# Patient Record
Sex: Male | Born: 1935
Health system: Southern US, Community
[De-identification: ages and names within clinical notes are randomized; demographics above are authoritative.]

## PROBLEM LIST (undated history)

## (undated) DIAGNOSIS — D649 Anemia, unspecified: Secondary | ICD-10-CM

## (undated) DIAGNOSIS — H409 Unspecified glaucoma: Secondary | ICD-10-CM

## (undated) DIAGNOSIS — I4891 Unspecified atrial fibrillation: Secondary | ICD-10-CM

## (undated) DIAGNOSIS — N281 Cyst of kidney, acquired: Secondary | ICD-10-CM

## (undated) DIAGNOSIS — K219 Gastro-esophageal reflux disease without esophagitis: Secondary | ICD-10-CM

## (undated) DIAGNOSIS — I10 Essential (primary) hypertension: Secondary | ICD-10-CM

## (undated) DIAGNOSIS — K922 Gastrointestinal hemorrhage, unspecified: Secondary | ICD-10-CM

## (undated) DIAGNOSIS — I2510 Atherosclerotic heart disease of native coronary artery without angina pectoris: Secondary | ICD-10-CM

## (undated) DIAGNOSIS — K5790 Diverticulosis of intestine, part unspecified, without perforation or abscess without bleeding: Secondary | ICD-10-CM

## (undated) DIAGNOSIS — N400 Enlarged prostate without lower urinary tract symptoms: Secondary | ICD-10-CM

## (undated) DIAGNOSIS — M1990 Unspecified osteoarthritis, unspecified site: Secondary | ICD-10-CM

## (undated) DIAGNOSIS — K859 Acute pancreatitis without necrosis or infection, unspecified: Secondary | ICD-10-CM

## (undated) DIAGNOSIS — E7800 Pure hypercholesterolemia, unspecified: Secondary | ICD-10-CM

## (undated) HISTORY — DX: Acute pancreatitis without necrosis or infection, unspecified: K85.90

## (undated) HISTORY — DX: Diverticulosis of intestine, part unspecified, without perforation or abscess without bleeding: K57.90

## (undated) HISTORY — DX: Cyst of kidney, acquired: N28.1

## (undated) HISTORY — DX: Unspecified osteoarthritis, unspecified site: M19.90

## (undated) HISTORY — DX: Essential (primary) hypertension: I10

## (undated) HISTORY — DX: Atherosclerotic heart disease of native coronary artery without angina pectoris: I25.10

## (undated) HISTORY — PX: CORONARY ARTERY BYPASS GRAFT: SHX141

## (undated) HISTORY — DX: Gastro-esophageal reflux disease without esophagitis: K21.9

## (undated) HISTORY — DX: Benign prostatic hyperplasia without lower urinary tract symptoms: N40.0

## (undated) HISTORY — DX: Anemia, unspecified: D64.9

## (undated) HISTORY — DX: Pure hypercholesterolemia, unspecified: E78.00

## (undated) HISTORY — DX: Unspecified atrial fibrillation: I48.91

## (undated) HISTORY — PX: APPENDECTOMY: SHX54

## (undated) HISTORY — DX: Unspecified glaucoma: H40.9

---

## 1989-01-13 HISTORY — PX: OTHER SURGICAL HISTORY: SHX169

## 1992-01-14 HISTORY — PX: CHOLECYSTECTOMY: SHX55

## 2004-08-27 ENCOUNTER — Ambulatory Visit: Payer: Self-pay | Admitting: Ophthalmology

## 2004-10-07 ENCOUNTER — Ambulatory Visit: Payer: Self-pay | Admitting: Ophthalmology

## 2006-08-13 ENCOUNTER — Ambulatory Visit: Payer: Self-pay | Admitting: Internal Medicine

## 2006-08-28 ENCOUNTER — Ambulatory Visit: Payer: Self-pay | Admitting: Internal Medicine

## 2006-11-17 ENCOUNTER — Ambulatory Visit: Payer: Self-pay | Admitting: Internal Medicine

## 2007-01-14 HISTORY — PX: INGUINAL HERNIA REPAIR: SUR1180

## 2007-06-23 ENCOUNTER — Ambulatory Visit: Payer: Self-pay | Admitting: Surgery

## 2007-06-23 ENCOUNTER — Other Ambulatory Visit: Payer: Self-pay

## 2007-07-06 ENCOUNTER — Ambulatory Visit: Payer: Self-pay | Admitting: Surgery

## 2008-08-11 ENCOUNTER — Ambulatory Visit: Payer: Self-pay | Admitting: Unknown Physician Specialty

## 2008-08-11 LAB — HM COLONOSCOPY

## 2009-05-12 ENCOUNTER — Emergency Department: Payer: Self-pay | Admitting: Emergency Medicine

## 2011-05-27 ENCOUNTER — Inpatient Hospital Stay: Payer: Self-pay | Admitting: Specialist

## 2011-05-27 LAB — CBC
HGB: 13.7 g/dL (ref 13.0–18.0)
MCH: 31.2 pg (ref 26.0–34.0)
MCHC: 34.1 g/dL (ref 32.0–36.0)
MCV: 92 fL (ref 80–100)

## 2011-05-27 LAB — COMPREHENSIVE METABOLIC PANEL
Alkaline Phosphatase: 93 U/L (ref 50–136)
Anion Gap: 7 (ref 7–16)
BUN: 14 mg/dL (ref 7–18)
Bilirubin,Total: 0.5 mg/dL (ref 0.2–1.0)
Co2: 27 mmol/L (ref 21–32)
Creatinine: 0.89 mg/dL (ref 0.60–1.30)
EGFR (African American): >60
EGFR (Non-African Amer.): >60
Osmolality: 269 (ref 275–301)
Potassium: 4.1 mmol/L (ref 3.5–5.1)
SGOT(AST): 28 U/L (ref 15–37)
Sodium: 134 mmol/L — ABNORMAL LOW (ref 136–145)
Total Protein: 7.5 g/dL (ref 6.4–8.2)

## 2011-05-27 LAB — CK TOTAL AND CKMB (NOT AT ARMC)
CK, Total: 109 U/L (ref 35–232)
CK, Total: 84 U/L (ref 35–232)
CK, Total: 93 U/L (ref 35–232)
CK-MB: 1.5 ng/mL (ref 0.5–3.6)

## 2011-05-27 LAB — TROPONIN I
Troponin-I: <0.02 ng/mL
Troponin-I: <0.02 ng/mL
Troponin-I: <0.02 ng/mL

## 2011-05-27 LAB — MAGNESIUM: Magnesium: 2 mg/dL

## 2011-05-27 LAB — PROTIME-INR
INR: 1
Prothrombin Time: 13.8 secs (ref 11.5–14.7)

## 2011-05-27 LAB — APTT: Activated PTT: >160 secs (ref 23.6–35.9)

## 2011-05-28 LAB — APTT
Activated PTT: 111.6 secs — ABNORMAL HIGH (ref 23.6–35.9)
Activated PTT: 56.1 secs — ABNORMAL HIGH (ref 23.6–35.9)

## 2011-10-13 ENCOUNTER — Emergency Department: Payer: Self-pay | Admitting: Emergency Medicine

## 2011-10-13 LAB — BASIC METABOLIC PANEL
Anion Gap: 9 (ref 7–16)
BUN: 17 mg/dL (ref 7–18)
Co2: 26 mmol/L (ref 21–32)
Creatinine: 1.09 mg/dL (ref 0.60–1.30)
EGFR (African American): >60

## 2011-10-13 LAB — URINALYSIS, COMPLETE
Bacteria: NONE SEEN
Bilirubin,UR: NEGATIVE
Leukocyte Esterase: NEGATIVE
Nitrite: NEGATIVE
Ph: 8 (ref 4.5–8.0)
Protein: NEGATIVE
RBC,UR: 13 /HPF (ref 0–5)
Squamous Epithelial: <1

## 2011-10-13 LAB — CBC
HCT: 43.3 % (ref 40.0–52.0)
HGB: 14.4 g/dL (ref 13.0–18.0)
MCHC: 33.3 g/dL (ref 32.0–36.0)
RBC: 4.75 10*6/uL (ref 4.40–5.90)
WBC: 13.5 10*3/uL — ABNORMAL HIGH (ref 3.8–10.6)

## 2011-10-13 LAB — PROTIME-INR: Prothrombin Time: 25 secs — ABNORMAL HIGH (ref 11.5–14.7)

## 2011-11-19 ENCOUNTER — Encounter: Payer: Self-pay | Admitting: Internal Medicine

## 2011-11-19 ENCOUNTER — Encounter: Payer: Self-pay | Admitting: *Deleted

## 2011-11-20 ENCOUNTER — Encounter: Payer: Self-pay | Admitting: Internal Medicine

## 2011-11-20 ENCOUNTER — Ambulatory Visit (INDEPENDENT_AMBULATORY_CARE_PROVIDER_SITE_OTHER): Payer: Medicare Other | Admitting: Internal Medicine

## 2011-11-20 VITALS — BP 138/78 | HR 67 | Temp 97.8°F | Ht 66.0 in | Wt 153.0 lb

## 2011-11-20 DIAGNOSIS — E7800 Pure hypercholesterolemia, unspecified: Secondary | ICD-10-CM

## 2011-11-20 DIAGNOSIS — I2510 Atherosclerotic heart disease of native coronary artery without angina pectoris: Secondary | ICD-10-CM

## 2011-11-20 DIAGNOSIS — D649 Anemia, unspecified: Secondary | ICD-10-CM

## 2011-11-20 DIAGNOSIS — I4891 Unspecified atrial fibrillation: Secondary | ICD-10-CM

## 2011-11-20 DIAGNOSIS — E871 Hypo-osmolality and hyponatremia: Secondary | ICD-10-CM

## 2011-11-20 DIAGNOSIS — I10 Essential (primary) hypertension: Secondary | ICD-10-CM

## 2011-11-20 LAB — CBC WITH DIFFERENTIAL/PLATELET
Basophils Absolute: 0 10*3/uL (ref 0.0–0.1)
Eosinophils Absolute: 0.1 10*3/uL (ref 0.0–0.7)
HCT: 42 % (ref 39.0–52.0)
Lymphs Abs: 1.5 10*3/uL (ref 0.7–4.0)
MCHC: 33.5 g/dL (ref 30.0–36.0)
Monocytes Relative: 11.6 % (ref 3.0–12.0)
Neutro Abs: 4.1 10*3/uL (ref 1.4–7.7)
Platelets: 253 10*3/uL (ref 150.0–400.0)
RDW: 14.8 % — ABNORMAL HIGH (ref 11.5–14.6)

## 2011-11-20 LAB — HEPATIC FUNCTION PANEL
AST: 33 U/L (ref 0–37)
Albumin: 4.5 g/dL (ref 3.5–5.2)
Alkaline Phosphatase: 71 U/L (ref 39–117)
Bilirubin, Direct: 0.1 mg/dL (ref 0.0–0.3)
Total Protein: 7.9 g/dL (ref 6.0–8.3)

## 2011-11-20 LAB — BASIC METABOLIC PANEL
BUN: 17 mg/dL (ref 6–23)
Calcium: 9.2 mg/dL (ref 8.4–10.5)
Creatinine, Ser: 1 mg/dL (ref 0.4–1.5)

## 2011-11-20 LAB — LIPID PANEL
Cholesterol: 154 mg/dL (ref 0–200)
Triglycerides: 159 mg/dL — ABNORMAL HIGH (ref 0.0–149.0)

## 2011-11-20 NOTE — Progress Notes (Signed)
  Subjective:    [name]: Tyler Stout, male    DOB: 03/01/1925, 78 y.o.   MRN: [id number]  HPI 78 year old male with past history of atherosclerotic heart disease s/p bypass graft x 4, hypercholesterolemia, reflux disease, hypertension and gallstone pancreatitis.  He comes in today for a scheduled follow up.  He states he is feeling better.  Recently had pneumonia.  States follow up CXR cleared.  No cough or congestion now.  Breathing at his baseline.  No chest pain or tightness.  Just saw Dr Wyn Quaker.  Stable.  Follow up recommended in six months.   Past Medical History  Diagnosis Date  . CAD (coronary artery disease)     s/p bypass graft x 4 (1991)  . GERD (gastroesophageal reflux disease)   . Atrial fibrillation   . Anemia     iron deficiency  . Hypertension   . Hypercholesterolemia   . Pancreatitis     x2 (1994 and 1998), presumed to be gallstone pancreatitis.  s/p ERCP and sphincterotomy  . Bilateral renal cysts     worked up by Dr Lonna Cobb  . Glaucoma   . BPH (benign prostatic hypertrophy)   . Diverticulosis   . Arthritis     Review of Systems Patient denies any headache, lightheadedness or dizziness.  Some drainage, but no significant sinus pressure or nasal congestion.   No chest pain, tightness or palpitations.  No increased shortness of breath, cough or congestion.  Breathing at baseline.   No nausea or vomiting.  No abdominal pain or cramping.  No bowel change, such as diarrhea, constipation, BRBPR or melana.  No urine change.        Objective:   Physical Exam 78  year old male in no acute distress.   HEENT:  Nares - clear.  OP- without lesions or erythema.  NECK:  Supple, nontender.    HEART:  Rate controlled.  LUNGS:  Without crackles or wheezing audible.  Respirations even and unlabored.   RADIAL PULSE:  Equal bilaterally.  ABDOMEN:  Soft, nontender.    EXTREMITIES:  No increased edema to be present.                  Assessment & Plan:  GI.  Doing better.  No  problems with increased gas.  Taking a probiotic daily.  Follow.  Colonoscopy 08/11/08 - diverticulosis and internal hemorrhoids.     HEALTH MAINTENANCE.  Last physical 04/07/11.  Prostate and PSAs followed by Dr Lonna Cobb.  Colonoscopy 08/11/08 revealed diverticulosis and internal hemorrhoids.  Got his flu shot a few weeks ago.

## 2011-11-20 NOTE — Patient Instructions (Addendum)
It was nice seeing you today.  I am glad you are feeling better.  We will check your cholesterol, etc today and notify you of the results once they become available.

## 2011-11-21 ENCOUNTER — Telehealth: Payer: Self-pay | Admitting: Internal Medicine

## 2011-11-21 DIAGNOSIS — R739 Hyperglycemia, unspecified: Secondary | ICD-10-CM

## 2011-11-21 NOTE — Telephone Encounter (Signed)
Notify pt cholesterol looks good.  Liver function wnl.  Other labs ok except slightly increased sugar.  Would like for him to return for a follow up sugar check to confirm wnl.  i have placed the order.  Please schedule him an appt to return for labs (schedule in 2-3 weeks) - week of thanksgiving (just before).

## 2011-11-21 NOTE — Telephone Encounter (Signed)
Called patient with results. Patient called back and scheduled appointment Nov 20 at 8:45.

## 2011-12-03 ENCOUNTER — Other Ambulatory Visit (INDEPENDENT_AMBULATORY_CARE_PROVIDER_SITE_OTHER): Payer: Medicare Other

## 2011-12-03 DIAGNOSIS — R7309 Other abnormal glucose: Secondary | ICD-10-CM

## 2011-12-03 DIAGNOSIS — R739 Hyperglycemia, unspecified: Secondary | ICD-10-CM

## 2011-12-03 LAB — GLUCOSE, RANDOM: Glucose, Bld: 105 mg/dL — ABNORMAL HIGH (ref 70–99)

## 2011-12-07 ENCOUNTER — Encounter: Payer: Self-pay | Admitting: Internal Medicine

## 2011-12-07 DIAGNOSIS — I4891 Unspecified atrial fibrillation: Secondary | ICD-10-CM | POA: Insufficient documentation

## 2011-12-07 DIAGNOSIS — I2510 Atherosclerotic heart disease of native coronary artery without angina pectoris: Secondary | ICD-10-CM | POA: Insufficient documentation

## 2011-12-07 DIAGNOSIS — E871 Hypo-osmolality and hyponatremia: Secondary | ICD-10-CM | POA: Insufficient documentation

## 2011-12-07 NOTE — Assessment & Plan Note (Signed)
Rate controlled on current meds.  On coumadin.  Dr Fath following.    

## 2011-12-07 NOTE — Assessment & Plan Note (Signed)
Sodium has been stable around 133-134.  Follow.  Recheck with next labs.

## 2011-12-07 NOTE — Assessment & Plan Note (Signed)
Blood pressure has been under good control on outside checks.  Same meds.  Check metabolic panel.  Follow pressures.

## 2011-12-07 NOTE — Assessment & Plan Note (Signed)
History of CAD s/p CABG.  Sees cardiology regularl - Dr Fath.  Negative functional study 01/18/10.  Currently symptoms stable.  Follow.  Continue risk factor modification.    

## 2011-12-07 NOTE — Assessment & Plan Note (Signed)
On Vytorin.  Low cholesterol diet and exercise.  Follow.   

## 2011-12-07 NOTE — Assessment & Plan Note (Signed)
Hgb has been stable.  Follow cbc.  Has seen GI.     

## 2012-01-18 ENCOUNTER — Telehealth: Payer: Self-pay | Admitting: Internal Medicine

## 2012-01-18 NOTE — Telephone Encounter (Signed)
Pt notified me that he needed to change from vytorin (his cholesterol medicine) - secondary to insurance.  Please confirm with him if he has had any problems taking lipitor.  If so, then see what other cholesterol medications he has had trouble taking and let me know.  Will need to change medication.

## 2012-01-19 NOTE — Telephone Encounter (Signed)
[phone]  Or [phone] Pt returned your

## 2012-01-19 NOTE — Telephone Encounter (Signed)
Patient stated that he had taken one of the other cholesterol medications (he does not remember which) and had a bad reaction. Pancreatis.

## 2012-01-20 ENCOUNTER — Telehealth: Payer: Self-pay | Admitting: Internal Medicine

## 2012-01-20 DIAGNOSIS — E7800 Pure hypercholesterolemia, unspecified: Secondary | ICD-10-CM

## 2012-01-20 DIAGNOSIS — I4891 Unspecified atrial fibrillation: Secondary | ICD-10-CM

## 2012-01-20 DIAGNOSIS — R739 Hyperglycemia, unspecified: Secondary | ICD-10-CM

## 2012-01-20 DIAGNOSIS — D649 Anemia, unspecified: Secondary | ICD-10-CM

## 2012-01-20 DIAGNOSIS — I10 Essential (primary) hypertension: Secondary | ICD-10-CM

## 2012-01-20 DIAGNOSIS — E871 Hypo-osmolality and hyponatremia: Secondary | ICD-10-CM

## 2012-01-20 NOTE — Telephone Encounter (Signed)
Patient wants to speak with about his cholesterol medication.

## 2012-01-21 NOTE — Telephone Encounter (Signed)
He is having to change cholesterol medications - secondary to insurance.  He has been on vytorin for years.  Insurance no longer covering.  Please call pt and tell him I am trying to get the information from [facility] - which cholesterol medicine he had tried previously.  Please ask him if he remembers when he took the medicine that caused him problems.

## 2012-01-21 NOTE — Telephone Encounter (Signed)
See other message

## 2012-01-21 NOTE — Telephone Encounter (Signed)
Forward to Dr. Scott 

## 2012-01-21 NOTE — Telephone Encounter (Signed)
Patient stated that he doesn't remember when he took the medicine that caused him problems. He said he would be willing to try Lipitor

## 2012-01-22 ENCOUNTER — Telehealth: Payer: Self-pay | Admitting: Internal Medicine

## 2012-01-22 NOTE — Telephone Encounter (Signed)
Called.  Pt had been on lipitor and was switched - secondary to not reaching goal for cholesterol

## 2012-01-22 NOTE — Telephone Encounter (Signed)
Give Tyler Stout a call.

## 2012-01-25 MED ORDER — ATORVASTATIN CALCIUM 20 MG PO TABS: 20.0000 mg | ORAL_TABLET | Freq: Every day | ORAL | Status: DC

## 2012-01-25 NOTE — Telephone Encounter (Signed)
Pt notified - reviewed [facility] records.  Has been on lipitor previously with no problems.  Will start lipitor 20mg  q day.  Needs labs 6 weeks after starts.  Needs lab appt scheduled for 03/15/12 at 8:00.  Pt aware of lab appt time - just please put on lab schedlule.  Thanks.

## 2012-01-26 NOTE — Telephone Encounter (Signed)
Appointment made

## 2012-01-28 LAB — PROTIME-INR

## 2012-02-10 ENCOUNTER — Telehealth: Payer: Self-pay | Admitting: Internal Medicine

## 2012-02-10 NOTE — Telephone Encounter (Signed)
Noted.  Please call pt and inform him to bring bp readings to his appt tomorrow.  Confirm he is doing well.

## 2012-02-10 NOTE — Telephone Encounter (Signed)
Called patient to remind him to bring in his bp readings. Patient stated that him is doing ok.

## 2012-02-10 NOTE — Telephone Encounter (Signed)
Pt calling, states he spoke with someone earlier this morning that was supposed to get back with him and told him to call back in 20 minutes if he had not heard back.  Pt states it has been 40 minutes and he has not heard back.  No msg in chart.  Pt asking for a call back.

## 2012-02-10 NOTE — Telephone Encounter (Signed)
See other message

## 2012-02-10 NOTE — Telephone Encounter (Signed)
Patient Information:  Caller Name: Caisen  Phone: [phone]  Patient: Tyler Stout, Tyler Stout  Gender: Male  DOB: [birth year]  Age: 79 Years  PCP: Dale Wanblee  Office Follow Up:  Does the office need to follow up with this patient?: No  Instructions For The Office: N/A  RN Note:  Denies emergent symptoms per hypertension protocol; advised appt within 2 weeks.  Appt scheduled 0930 02/11/12 with Dr. Lorin Picket.  krs/can  Symptoms  Reason For Call & Symptoms: hypertension; BP 173/80; increased cardizem per cardiologist 2 weeks ago.  Reviewed Health History In EMR: Yes  Reviewed Medications In EMR: Yes  Reviewed Allergies In EMR: Yes  Reviewed Surgeries / Procedures: Yes  Date of Onset of Symptoms: 01/20/2012  Guideline(s) Used:  High Blood Pressure  Disposition Per Guideline:   See Within 2 Weeks in Office  Reason For Disposition Reached:   BP > 140/90 and is taking BP medications  Advice Given:  N/A  Appointment Scheduled:  02/11/2012 09:30:00 Appointment Scheduled Provider:  Dale Irwin

## 2012-02-11 ENCOUNTER — Ambulatory Visit: Payer: Self-pay | Admitting: Internal Medicine

## 2012-02-12 ENCOUNTER — Encounter: Payer: Self-pay | Admitting: Internal Medicine

## 2012-02-12 ENCOUNTER — Ambulatory Visit (INDEPENDENT_AMBULATORY_CARE_PROVIDER_SITE_OTHER): Payer: Medicare Other | Admitting: Internal Medicine

## 2012-02-12 VITALS — BP 162/78 | HR 78 | Temp 98.0°F | Resp 16 | Wt 146.5 lb

## 2012-02-12 DIAGNOSIS — I2510 Atherosclerotic heart disease of native coronary artery without angina pectoris: Secondary | ICD-10-CM

## 2012-02-12 DIAGNOSIS — I4891 Unspecified atrial fibrillation: Secondary | ICD-10-CM

## 2012-02-12 DIAGNOSIS — I10 Essential (primary) hypertension: Secondary | ICD-10-CM

## 2012-02-12 DIAGNOSIS — E871 Hypo-osmolality and hyponatremia: Secondary | ICD-10-CM

## 2012-02-12 MED ORDER — SERTRALINE HCL 25 MG PO TABS: 25.0000 mg | ORAL_TABLET | Freq: Every day | ORAL | Status: DC

## 2012-02-12 MED ORDER — HYDROCHLOROTHIAZIDE 12.5 MG PO CAPS: 12.5000 mg | ORAL_CAPSULE | Freq: Every day | ORAL | Status: DC

## 2012-02-13 ENCOUNTER — Encounter: Payer: Self-pay | Admitting: Internal Medicine

## 2012-02-13 NOTE — Assessment & Plan Note (Signed)
Adding the hctz as outlined.  Will need to follow sodium closely.

## 2012-02-13 NOTE — Assessment & Plan Note (Signed)
Just saw Dr Lady Gary.  Felt heart stable.  Follow.

## 2012-02-13 NOTE — Progress Notes (Signed)
Subjective:    [name]: Tyler Stout, male    DOB: 05/13/1925, 79 y.o.   MRN: [id number]  HPI 79 year old male with past history of atherosclerotic heart disease s/p bypass graft x 4, hypercholesterolemia, reflux disease, hypertension and gallstone pancreatitis.  He comes in today as a work in with concerns regarding elevated blood pressure.  States that his blood pressure has been running higher lately.  Saw Dr Lady Gary a few weeks ago.  States his heart checked out fine.  Dr Lady Gary increased his cardizem to bid. Did EKG.   Felt his heart checked out fine.  Blood pressure has been averaging 130-150s/70s until the last few days.  Lately has been increased 160-170s/70s.  States he has been more anxious lately.  Worrying.  Has some financial issues he needs to get addressed.  No chest pain or tightness.  Not sleeping well because he is worrying and mind won't shut down.  No depression.     Past Medical History  Diagnosis Date  . CAD (coronary artery disease)     s/p bypass graft x 4 (1991)  . GERD (gastroesophageal reflux disease)   . Atrial fibrillation   . Anemia     iron deficiency  . Hypertension   . Hypercholesterolemia   . Pancreatitis     x2 (1994 and 1998), presumed to be gallstone pancreatitis.  s/p ERCP and sphincterotomy  . Bilateral renal cysts     worked up by Dr Lonna Cobb  . Glaucoma   . BPH (benign prostatic hypertrophy)   . Diverticulosis   . Arthritis     Current Outpatient Prescriptions on File Prior to Visit  Medication Sig Dispense Refill  . atorvastatin (LIPITOR) 20 MG tablet Take 1 tablet (20 mg total) by mouth daily.  30 tablet  3  . Calcium Carbonate-Vitamin D (CALCIUM 600+D) 600-400 MG-UNIT per tablet Take 1 tablet by mouth daily.      . Diltiazem HCl Coated Beads (CARDIZEM CD PO) Take 120 mg by mouth daily. As directed      . fish oil-omega-3 fatty acids 1000 MG capsule Take 1 g by mouth daily.      . fluticasone (FLONASE) 50 MCG/ACT nasal spray Place 2 sprays  into the nose daily. 2 Sprays in each nostril daily      . hydrochlorothiazide (MICROZIDE) 12.5 MG capsule Take 1 capsule (12.5 mg total) by mouth daily.  30 capsule  2  . irbesartan-hydrochlorothiazide (AVALIDE) 300-12.5 MG per tablet Take 1 tablet by mouth daily.      Marland Kitchen latanoprost (XALATAN) 0.005 % ophthalmic solution Place 1 drop into both eyes daily. 1 drop in each eye      . metoprolol tartrate (LOPRESSOR) 25 MG tablet Take 25 mg by mouth 2 (two) times daily.      . Multiple Vitamin (MULTI-VITAMIN DAILY PO) Take by mouth.      Marland Kitchen omeprazole (PRILOSEC) 20 MG capsule Take 20 mg by mouth daily.      . sertraline (ZOLOFT) 25 MG tablet Take 1 tablet (25 mg total) by mouth daily.  30 tablet  2  . vitamin C (ASCORBIC ACID) 500 MG tablet Take 500 mg by mouth daily.      Marland Kitchen warfarin (COUMADIN) 5 MG tablet 5 mg. As directed        Review of Systems Patient denies any headache, lightheadedness or dizziness.  No chest pain, tightness or palpitations.  No increased shortness of breath, cough or congestion.  Breathing at baseline.  No nausea or vomiting.  No abdominal pain or cramping.  No bowel change, such as diarrhea, constipation, BRBPR or melana.  No urine change.   Increased anxiety as outlined.  No depression.  Not sleeping as well.       Objective:   Physical Exam  Filed Vitals:   02/12/12 1208  BP: 162/78  Pulse: 78  Temp: 98 F (36.7 C)  Resp: 16   Blood pressure recheck:  40/7  79  year old male in no acute distress.   HEENT:  Nares - clear.  OP- without lesions or erythema.  NECK:  Supple, nontender.    HEART:  Rate controlled.  LUNGS:  Without crackles or wheezing audible.  Respirations even and unlabored.   RADIAL PULSE:  Equal bilaterally.  ABDOMEN:  Soft, nontender.    EXTREMITIES:  No increased edema to be present.                  Assessment & Plan:  INCREASED STRESS/ANXIETY.  Discussed at length with the patient and his wife.  No depression.  Not sleeping as well.   Will start zoloft 25mg  q day.  Follow closely.  Get him back in soon to reassess.  Pt comfortable with this plan.  Will let me know if any problems.  GI.  Has done better.  No problems with increased gas.  Taking a probiotic daily.  Follow.  Colonoscopy 08/11/08 - diverticulosis and internal hemorrhoids.     HEALTH MAINTENANCE.  Last physical 04/07/11.  Prostate and PSAs followed by Dr Lonna Cobb.  Colonoscopy 08/11/08 revealed diverticulosis and internal hemorrhoids.  Got his flu shot.

## 2012-02-13 NOTE — Assessment & Plan Note (Signed)
Blood pressure elevated.  Will add HCTZ 12.5mg  q day to his current regimen.  Follow pressures closely.  Will need to monitor sodium.

## 2012-02-13 NOTE — Assessment & Plan Note (Signed)
Rate controlled.  Seeing Dr Lady Gary.  Follow.

## 2012-02-25 ENCOUNTER — Telehealth: Payer: Self-pay | Admitting: Internal Medicine

## 2012-02-25 NOTE — Telephone Encounter (Signed)
Please Advise

## 2012-02-25 NOTE — Telephone Encounter (Signed)
Patient notified

## 2012-02-25 NOTE — Telephone Encounter (Signed)
He is going to need to be seen if having urinary sx.  If acute sx - to acute care today - I will not be in office this pm.  off

## 2012-02-25 NOTE — Telephone Encounter (Signed)
Patient has a urinary tract infection and wants something called into the pharmacy . He wants a call back when or if you call something in.

## 2012-03-01 ENCOUNTER — Emergency Department: Payer: Self-pay | Admitting: Emergency Medicine

## 2012-03-01 LAB — URINALYSIS, COMPLETE
Glucose,UR: NEGATIVE mg/dL (ref 0–75)
Ph: 7 (ref 4.5–8.0)
Protein: NEGATIVE
RBC,UR: 11 /HPF (ref 0–5)
Squamous Epithelial: <1
WBC UR: 1 /HPF (ref 0–5)

## 2012-03-15 ENCOUNTER — Other Ambulatory Visit (INDEPENDENT_AMBULATORY_CARE_PROVIDER_SITE_OTHER): Payer: Medicare Other

## 2012-03-15 DIAGNOSIS — I4891 Unspecified atrial fibrillation: Secondary | ICD-10-CM

## 2012-03-15 DIAGNOSIS — R7309 Other abnormal glucose: Secondary | ICD-10-CM

## 2012-03-15 DIAGNOSIS — I10 Essential (primary) hypertension: Secondary | ICD-10-CM

## 2012-03-15 DIAGNOSIS — R739 Hyperglycemia, unspecified: Secondary | ICD-10-CM

## 2012-03-15 DIAGNOSIS — D649 Anemia, unspecified: Secondary | ICD-10-CM

## 2012-03-15 DIAGNOSIS — E7800 Pure hypercholesterolemia, unspecified: Secondary | ICD-10-CM

## 2012-03-15 LAB — BASIC METABOLIC PANEL
BUN: 22 mg/dL (ref 6–23)
CO2: 23 mEq/L (ref 19–32)
Calcium: 8.7 mg/dL (ref 8.4–10.5)
Creatinine, Ser: 0.8 mg/dL (ref 0.4–1.5)
Glucose, Bld: 108 mg/dL — ABNORMAL HIGH (ref 70–99)

## 2012-03-15 LAB — HEPATIC FUNCTION PANEL
AST: 25 U/L (ref 0–37)
Albumin: 3.4 g/dL — ABNORMAL LOW (ref 3.5–5.2)
Alkaline Phosphatase: 76 U/L (ref 39–117)
Bilirubin, Direct: 0.3 mg/dL (ref 0.0–0.3)
Total Protein: 6.4 g/dL (ref 6.0–8.3)

## 2012-03-15 LAB — CBC WITH DIFFERENTIAL/PLATELET
Basophils Absolute: 0.1 10*3/uL (ref 0.0–0.1)
Eosinophils Absolute: 0.1 10*3/uL (ref 0.0–0.7)
Lymphocytes Relative: 8.6 % — ABNORMAL LOW (ref 12.0–46.0)
MCHC: 33.9 g/dL (ref 30.0–36.0)
Monocytes Absolute: 0.9 10*3/uL (ref 0.1–1.0)
Neutrophils Relative %: 80.1 % — ABNORMAL HIGH (ref 43.0–77.0)
RDW: 14.2 % (ref 11.5–14.6)

## 2012-03-15 LAB — LIPID PANEL
HDL: 51.5 mg/dL (ref >39.00)
Triglycerides: 94 mg/dL (ref 0.0–149.0)

## 2012-03-15 LAB — FERRITIN: Ferritin: 113.7 ng/mL (ref 22.0–322.0)

## 2012-03-17 ENCOUNTER — Ambulatory Visit (INDEPENDENT_AMBULATORY_CARE_PROVIDER_SITE_OTHER): Payer: Medicare Other | Admitting: Internal Medicine

## 2012-03-17 ENCOUNTER — Encounter: Payer: Self-pay | Admitting: Internal Medicine

## 2012-03-17 VITALS — BP 140/68 | HR 76 | Temp 97.7°F | Ht 66.0 in | Wt 148.5 lb

## 2012-03-17 DIAGNOSIS — E871 Hypo-osmolality and hyponatremia: Secondary | ICD-10-CM

## 2012-03-17 DIAGNOSIS — I2510 Atherosclerotic heart disease of native coronary artery without angina pectoris: Secondary | ICD-10-CM

## 2012-03-17 DIAGNOSIS — I4891 Unspecified atrial fibrillation: Secondary | ICD-10-CM

## 2012-03-17 DIAGNOSIS — I10 Essential (primary) hypertension: Secondary | ICD-10-CM

## 2012-03-18 ENCOUNTER — Other Ambulatory Visit: Payer: Self-pay | Admitting: Internal Medicine

## 2012-03-18 DIAGNOSIS — E871 Hypo-osmolality and hyponatremia: Secondary | ICD-10-CM

## 2012-03-18 NOTE — Progress Notes (Signed)
Order placed for follow up sodium.  

## 2012-03-19 ENCOUNTER — Other Ambulatory Visit: Payer: Medicare Other

## 2012-03-21 ENCOUNTER — Encounter: Payer: Self-pay | Admitting: Internal Medicine

## 2012-03-21 NOTE — Assessment & Plan Note (Signed)
Blood pressure today 132/72.  Did not take the HCTZ or the avalide this am.  Will continue the avalide.  Will remain off the hctz.  Follow sodium.  Recheck today.

## 2012-03-21 NOTE — Assessment & Plan Note (Signed)
History of CAD s/p CABG.  Sees cardiology regularl - Dr Lady Gary.  Negative functional study 01/18/10.  Currently symptoms stable.  Follow.  Continue risk factor modification.

## 2012-03-21 NOTE — Assessment & Plan Note (Signed)
Remain off hctz.  Will follow sodium.

## 2012-03-21 NOTE — Assessment & Plan Note (Signed)
Rate controlled on current meds.  On coumadin.  Dr Fath following.    

## 2012-03-21 NOTE — Progress Notes (Signed)
Subjective:    [name]: Tyler Stout, male    DOB: 1925-10-07, 79 y.o.   MRN: [id number]  HPI 79 year old male with past history of atherosclerotic heart disease s/p bypass graft x 4, hypercholesterolemia, reflux disease, hypertension and gallstone pancreatitis.  He comes in today for a scheduled follow up.  His blood pressure had been running higher lately.  Saw Dr Lady Gary recently.   States his heart checked out fine.  Dr Lady Gary increased his cardizem to bid. Did EKG.   We added 12.5mg  of HCTZ.  Blood pressure has been averaging 130-150s/70s.   Sodium just checked and down.  Was instructed to stop the hctz.  States he has been more anxious lately.  Worrying.  Has some financial issues he needs to get addressed.  Started zoloft, but stopped.  Did not feel it was helping.  No chest pain or tightness.  Not sleeping well because he is worrying and mind won't shut down.       Past Medical History  Diagnosis Date  . CAD (coronary artery disease)     s/p bypass graft x 4 (1991)  . GERD (gastroesophageal reflux disease)   . Atrial fibrillation   . Anemia     iron deficiency  . Hypertension   . Hypercholesterolemia   . Pancreatitis     x2 (1994 and 1998), presumed to be gallstone pancreatitis.  s/p ERCP and sphincterotomy  . Bilateral renal cysts     worked up by Dr Lonna Cobb  . Glaucoma   . BPH (benign prostatic hypertrophy)   . Diverticulosis   . Arthritis     Current Outpatient Prescriptions on File Prior to Visit  Medication Sig Dispense Refill  . atorvastatin (LIPITOR) 20 MG tablet Take 1 tablet (20 mg total) by mouth daily.  30 tablet  3  . Calcium Carbonate-Vitamin D (CALCIUM 600+D) 600-400 MG-UNIT per tablet Take 1 tablet by mouth daily.      . Diltiazem HCl Coated Beads (CARDIZEM CD PO) Take 120 mg by mouth daily. As directed      . fish oil-omega-3 fatty acids 1000 MG capsule Take 1 g by mouth daily.      . irbesartan-hydrochlorothiazide (AVALIDE) 300-12.5 MG per tablet Take 1  tablet by mouth daily.      Marland Kitchen latanoprost (XALATAN) 0.005 % ophthalmic solution Place 1 drop into both eyes daily. 1 drop in each eye      . metoprolol tartrate (LOPRESSOR) 25 MG tablet Take 25 mg by mouth 2 (two) times daily.      . Multiple Vitamin (MULTI-VITAMIN DAILY PO) Take by mouth.      Marland Kitchen omeprazole (PRILOSEC) 20 MG capsule Take 20 mg by mouth daily.      . vitamin C (ASCORBIC ACID) 500 MG tablet Take 500 mg by mouth daily.      Marland Kitchen warfarin (COUMADIN) 5 MG tablet 5 mg. As directed      . fluticasone (FLONASE) 50 MCG/ACT nasal spray Place 2 sprays into the nose daily. 2 Sprays in each nostril daily      . hydrochlorothiazide (MICROZIDE) 12.5 MG capsule Take 1 capsule (12.5 mg total) by mouth daily.  30 capsule  2  . sertraline (ZOLOFT) 25 MG tablet Take 1 tablet (25 mg total) by mouth daily.  30 tablet  2   No current facility-administered medications on file prior to visit.    Review of Systems Patient denies any headache.  Does report a couple of episodes where  his head felt funny.  Woke up - noticed.  No chest pain, tightness or palpitations.  No increased shortness of breath, cough or congestion.  Breathing at baseline.   No nausea or vomiting.  No abdominal pain or cramping.  No bowel change, such as diarrhea, constipation, BRBPR or melana.  No urine change.   Increased anxiety as outlined.  No depression.  Not sleeping as well.       Objective:   Physical Exam  Filed Vitals:   03/17/12 0912  BP: 140/68  Pulse: 76  Temp: 97.7 F (36.5 C)   Blood pressure recheck:  66/65  79  year old male in no acute distress.   HEENT:  Nares - clear.  OP- without lesions or erythema.  NECK:  Supple, nontender.    HEART:  Rate controlled.  LUNGS:  Without crackles or wheezing audible.  Respirations even and unlabored.   RADIAL PULSE:  Equal bilaterally.  ABDOMEN:  Soft, nontender.    EXTREMITIES:  No increased edema to be present.                  Assessment & Plan:  INCREASED  STRESS/ANXIETY.  Discussed at length with the patient and his wife.  No depression.  Will remain off medication for now.  Follow closely.  Continue increased po intake and stay hydrated.  He prefers no medication or further intervention at this time.   DIZZINESS.  Unclear as to the exact etiology.  Denies now.  Blood pressure looks good today off the hctz.  Could have been some orthostatic hypotension.  Also discussed eating regular meals.  Just had heart checked out and everything checked out fine.     GI.  Has done better.  No problems with increased gas.  Taking a probiotic daily.  Follow.  Colonoscopy 08/11/08 - diverticulosis and internal hemorrhoids.     HEALTH MAINTENANCE.  Last physical 04/07/11.  Prostate and PSAs followed by Dr Lonna Cobb.  Colonoscopy 08/11/08 revealed diverticulosis and internal hemorrhoids.  Got his flu shot.

## 2012-03-23 ENCOUNTER — Other Ambulatory Visit (INDEPENDENT_AMBULATORY_CARE_PROVIDER_SITE_OTHER): Payer: Medicare Other

## 2012-03-23 ENCOUNTER — Other Ambulatory Visit: Payer: Self-pay | Admitting: Internal Medicine

## 2012-03-23 DIAGNOSIS — E871 Hypo-osmolality and hyponatremia: Secondary | ICD-10-CM

## 2012-03-23 NOTE — Progress Notes (Signed)
Order placed for f/u sodium.  ?

## 2012-04-14 ENCOUNTER — Other Ambulatory Visit (INDEPENDENT_AMBULATORY_CARE_PROVIDER_SITE_OTHER): Payer: Medicare Other

## 2012-04-14 DIAGNOSIS — E871 Hypo-osmolality and hyponatremia: Secondary | ICD-10-CM

## 2012-04-14 LAB — SODIUM: Sodium: 133 mEq/L — ABNORMAL LOW (ref 135–145)

## 2012-04-15 ENCOUNTER — Encounter: Payer: Self-pay | Admitting: *Deleted

## 2012-05-03 ENCOUNTER — Other Ambulatory Visit: Payer: Self-pay | Admitting: Internal Medicine

## 2012-05-03 MED ORDER — IRBESARTAN-HYDROCHLOROTHIAZIDE 300-12.5 MG PO TABS: 1.0000 | ORAL_TABLET | Freq: Every day | ORAL | Status: DC

## 2012-05-03 NOTE — Progress Notes (Signed)
rx sent to [facility] [address] for generic avalide #90 with 3 refills.

## 2012-05-19 ENCOUNTER — Encounter: Payer: Self-pay | Admitting: Internal Medicine

## 2012-05-19 ENCOUNTER — Ambulatory Visit (INDEPENDENT_AMBULATORY_CARE_PROVIDER_SITE_OTHER): Payer: Medicare Other | Admitting: Internal Medicine

## 2012-05-19 VITALS — BP 110/60 | HR 56 | Temp 98.3°F | Ht 66.0 in | Wt 151.5 lb

## 2012-05-19 DIAGNOSIS — E871 Hypo-osmolality and hyponatremia: Secondary | ICD-10-CM

## 2012-05-19 DIAGNOSIS — D649 Anemia, unspecified: Secondary | ICD-10-CM

## 2012-05-19 DIAGNOSIS — I2510 Atherosclerotic heart disease of native coronary artery without angina pectoris: Secondary | ICD-10-CM

## 2012-05-19 DIAGNOSIS — E7800 Pure hypercholesterolemia, unspecified: Secondary | ICD-10-CM

## 2012-05-19 DIAGNOSIS — I10 Essential (primary) hypertension: Secondary | ICD-10-CM

## 2012-05-19 DIAGNOSIS — I4891 Unspecified atrial fibrillation: Secondary | ICD-10-CM

## 2012-05-19 MED ORDER — ATORVASTATIN CALCIUM 20 MG PO TABS: 20.0000 mg | ORAL_TABLET | Freq: Every day | ORAL | Status: DC

## 2012-05-19 NOTE — Progress Notes (Signed)
Subjective:    [name]: Tyler Stout, male    DOB: 11/15/1925, 79 y.o.   MRN: [id number]  HPI 79 year old male with past history of atherosclerotic heart disease s/p bypass graft x 4, hypercholesterolemia, reflux disease, hypertension and gallstone pancreatitis.  He comes in today for a scheduled follow up.  His blood pressure has been doing better.  Saw Dr Lady Gary recently.   States his heart checked out fine.  Dr Lady Gary increased his cardizem to bid. Did EKG.   We added 12.5mg  of HCTZ.  Sodium decreased and blood pressure improved.  He is now off the additional HCTZ.  Sodium improved.  States he is not worrying as much.  He does report some jittery feeling in the am, but states it resolves as the day progresses.  No chest pain or tightness.  States it may be related to increased stress.  No nausea or vomiting.  Bowels stable.  Overall feels he is doing better.       Past Medical History  Diagnosis Date  . CAD (coronary artery disease)     s/p bypass graft x 4 (1991)  . GERD (gastroesophageal reflux disease)   . Atrial fibrillation   . Anemia     iron deficiency  . Hypertension   . Hypercholesterolemia   . Pancreatitis     x2 (1994 and 1998), presumed to be gallstone pancreatitis.  s/p ERCP and sphincterotomy  . Bilateral renal cysts     worked up by Dr Lonna Cobb  . Glaucoma   . BPH (benign prostatic hypertrophy)   . Diverticulosis   . Arthritis     Current Outpatient Prescriptions on File Prior to Visit  Medication Sig Dispense Refill  . Calcium Carbonate-Vitamin D (CALCIUM 600+D) 600-400 MG-UNIT per tablet Take 1 tablet by mouth daily.      . Diltiazem HCl Coated Beads (CARDIZEM CD PO) Take 120 mg by mouth daily. As directed      . fish oil-omega-3 fatty acids 1000 MG capsule Take 1 g by mouth daily.      . hydrochlorothiazide (MICROZIDE) 12.5 MG capsule Take 1 capsule (12.5 mg total) by mouth daily.  30 capsule  2  . irbesartan-hydrochlorothiazide (AVALIDE) 300-12.5 MG per tablet  Take 1 tablet by mouth daily.  90 tablet  3  . latanoprost (XALATAN) 0.005 % ophthalmic solution Place 1 drop into both eyes daily. 1 drop in each eye      . metoprolol tartrate (LOPRESSOR) 25 MG tablet Take 25 mg by mouth 2 (two) times daily.      . Multiple Vitamin (MULTI-VITAMIN DAILY PO) Take by mouth.      Marland Kitchen omeprazole (PRILOSEC) 20 MG capsule Take 20 mg by mouth daily.      . sertraline (ZOLOFT) 25 MG tablet Take 1 tablet (25 mg total) by mouth daily.  30 tablet  2  . Tamsulosin HCl (FLOMAX PO) Take by mouth 2 (two) times daily.      . vitamin C (ASCORBIC ACID) 500 MG tablet Take 500 mg by mouth daily.      Marland Kitchen warfarin (COUMADIN) 5 MG tablet 5 mg. As directed      . fluticasone (FLONASE) 50 MCG/ACT nasal spray Place 2 sprays into the nose daily. 2 Sprays in each nostril daily       No current facility-administered medications on file prior to visit.    Review of Systems Patient denies any headache.  No chest pain, tightness or palpitations.  No increased  shortness of breath, cough or congestion.  Breathing at baseline.   No nausea or vomiting.  No abdominal pain or cramping.  No bowel change, such as diarrhea, constipation, BRBPR or melana.  No urine change.    No depression.  Anxiety improved.  Feels better.  Feels things are stable.      Objective:   Physical Exam  Filed Vitals:   05/19/12 0921  BP: 110/60  Pulse: 56  Temp: 98.3 F (36.8 C)   Blood pressure recheck:  124/64, pulse 62  79  year old male in no acute distress.   HEENT:  Nares - clear.  OP- without lesions or erythema.  NECK:  Supple, nontender.    HEART:  Rate controlled.  LUNGS:  Without crackles or wheezing audible.  Respirations even and unlabored.   RADIAL PULSE:  Equal bilaterally.  ABDOMEN:  Soft, nontender.    EXTREMITIES:  No increased edema to be present.                  Assessment & Plan:  INCREASED STRESS/ANXIETY.  Doing better.  Desires no further medication.  Wants to monitor.  Follow.  Off  zoloft.    DIZZINESS.  Not an issue for him now.  Follow.      GI.  Has done better.  No problems with increased gas.  Taking a probiotic daily.  Follow.  Colonoscopy 08/11/08 - diverticulosis and internal hemorrhoids.     HEALTH MAINTENANCE.  Last physical 04/07/11.  Prostate and PSAs followed by Dr Lonna Cobb.  Colonoscopy 08/11/08 revealed diverticulosis and internal hemorrhoids.  Schedule a physical next visit.

## 2012-05-23 ENCOUNTER — Encounter: Payer: Self-pay | Admitting: Internal Medicine

## 2012-05-23 NOTE — Assessment & Plan Note (Signed)
Rate controlled on current meds.  On coumadin.  Dr Fath following.    

## 2012-05-23 NOTE — Assessment & Plan Note (Signed)
Remain off hctz.  Will follow sodium. Improved last check.

## 2012-05-23 NOTE — Assessment & Plan Note (Signed)
On Vytorin.  Low cholesterol diet and exercise.  Follow.   

## 2012-05-23 NOTE — Assessment & Plan Note (Signed)
Blood pressure today 124/64.  Will continue the avalide.  Will remain off the hctz.  Follow sodium.  His blood pressure readings averaging 115-130s/ 50-60s.  Follow.

## 2012-05-23 NOTE — Assessment & Plan Note (Signed)
History of CAD s/p CABG.  Sees cardiology regularly - Dr Lady Gary.  Negative functional study 01/18/10.  Currently symptoms stable.  Follow.  Continue risk factor modification.

## 2012-05-23 NOTE — Assessment & Plan Note (Signed)
Hgb has been stable.  Follow cbc.  Has seen GI.

## 2012-06-14 ENCOUNTER — Encounter: Payer: Self-pay | Admitting: Adult Health

## 2012-06-14 ENCOUNTER — Ambulatory Visit (INDEPENDENT_AMBULATORY_CARE_PROVIDER_SITE_OTHER): Payer: Medicare Other | Admitting: Adult Health

## 2012-06-14 ENCOUNTER — Telehealth: Payer: Self-pay | Admitting: Internal Medicine

## 2012-06-14 VITALS — BP 120/56 | HR 57 | Temp 97.8°F | Resp 12 | Wt 151.5 lb

## 2012-06-14 DIAGNOSIS — R21 Rash and other nonspecific skin eruption: Secondary | ICD-10-CM

## 2012-06-14 DIAGNOSIS — Z7901 Long term (current) use of anticoagulants: Secondary | ICD-10-CM

## 2012-06-14 DIAGNOSIS — L538 Other specified erythematous conditions: Secondary | ICD-10-CM

## 2012-06-14 LAB — CBC WITH DIFFERENTIAL/PLATELET
Eosinophils Absolute: 0.1 10*3/uL (ref 0.0–0.7)
Lymphocytes Relative: 18.1 % (ref 12.0–46.0)
MCHC: 33.6 g/dL (ref 30.0–36.0)
MCV: 92.9 fl (ref 78.0–100.0)
Monocytes Absolute: 0.7 10*3/uL (ref 0.1–1.0)
Neutrophils Relative %: 69.6 % (ref 43.0–77.0)
Platelets: 240 10*3/uL (ref 150.0–400.0)
WBC: 6.2 10*3/uL (ref 4.5–10.5)

## 2012-06-14 MED ORDER — DOXYCYCLINE HYCLATE 100 MG PO TABS: 100.0000 mg | ORAL_TABLET | Freq: Two times a day (BID) | ORAL | Status: DC

## 2012-06-14 NOTE — Patient Instructions (Addendum)
  You have an appointment with your Dermatologist this afternoon.  Please have your labs drawn prior to leaving the office.  I have started you on Doxycycline 100 mg twice a day for 10 days.  You will need to return to our clinic on Wednesday morning to have your coumadin level checked. Antibiotics can increase the effects of coumadin and make your blood thinner, so we will need to monitor this.

## 2012-06-14 NOTE — Progress Notes (Signed)
  Subjective:    [name]: Tyler Stout, male    DOB: 26-Sep-1925, 79 y.o.   MRN: [id number]  HPI  Patient presents to clinic with hx of tick bite on left leg. Now has rash that began on Saturday. He denies pain, fever, chills or headache. The area is not itching.   Current Outpatient Prescriptions on File Prior to Visit  Medication Sig Dispense Refill  . atorvastatin (LIPITOR) 20 MG tablet Take 1 tablet (20 mg total) by mouth daily.  90 tablet  3  . Calcium Carbonate-Vitamin D (CALCIUM 600+D) 600-400 MG-UNIT per tablet Take 1 tablet by mouth daily.      . Diltiazem HCl Coated Beads (CARDIZEM CD PO) Take 120 mg by mouth daily. As directed      . fish oil-omega-3 fatty acids 1000 MG capsule Take 1 g by mouth daily.      . fluticasone (FLONASE) 50 MCG/ACT nasal spray Place 2 sprays into the nose daily. 2 Sprays in each nostril daily      . hydrochlorothiazide (MICROZIDE) 12.5 MG capsule Take 1 capsule (12.5 mg total) by mouth daily.  30 capsule  2  . irbesartan-hydrochlorothiazide (AVALIDE) 300-12.5 MG per tablet Take 1 tablet by mouth daily.  90 tablet  3  . latanoprost (XALATAN) 0.005 % ophthalmic solution Place 1 drop into both eyes daily. 1 drop in each eye      . metoprolol tartrate (LOPRESSOR) 25 MG tablet Take 25 mg by mouth 2 (two) times daily.      . Multiple Vitamin (MULTI-VITAMIN DAILY PO) Take by mouth.      Marland Kitchen omeprazole (PRILOSEC) 20 MG capsule Take 20 mg by mouth daily.      . sertraline (ZOLOFT) 25 MG tablet Take 1 tablet (25 mg total) by mouth daily.  30 tablet  2  . Tamsulosin HCl (FLOMAX PO) Take by mouth 2 (two) times daily.      . vitamin C (ASCORBIC ACID) 500 MG tablet Take 500 mg by mouth daily.      Marland Kitchen warfarin (COUMADIN) 5 MG tablet 5 mg. As directed       No current facility-administered medications on file prior to visit.     Review of Systems  Constitutional: Negative.   Skin: Positive for rash.  Neurological: Negative.         Objective:   Physical  Exam  Constitutional: He is oriented to person, place, and time. He appears well-developed and well-nourished. No distress.  Cardiovascular: Normal rate and regular rhythm.   Pulmonary/Chest: Effort normal. No respiratory distress.  Neurological: He is alert and oriented to person, place, and time.  Skin: Skin is warm and dry. There is erythema.  Macular rash anterior left lower extremity. There is no drainage. There is no pain upon palpation. Area is erythematous.  Psychiatric: He has a normal mood and affect. His behavior is normal. Judgment and thought content normal.       Assessment & Plan:

## 2012-06-14 NOTE — Addendum Note (Signed)
Addended by: Novella Olive on: 06/14/2012 12:10 PM   Modules accepted: Orders

## 2012-06-14 NOTE — Assessment & Plan Note (Signed)
Rash to LLE after tick bite. Start doxycycline. Also sending patient to be seen by Derm. Rash is not typical in appearance to tick rash. It is also non tender.

## 2012-06-14 NOTE — Telephone Encounter (Signed)
Patient Information:  Caller Name: Jun  Phone: [phone]  Patient: Tyler Stout, Tyler Stout  Gender: Male  DOB: [birth year]  Age: 79 Years  PCP: Dale [place]  Office Follow Up:  Does the office need to follow up with this patient?: No  Instructions For The Office: N/A  RN Note:  rash is blotchy; and primarily around the bite site  Symptoms  Reason For Call & Symptoms: pt reports he had a tick bite on left leg and now pt has a rash on that leg.  Pt also had a tick on another part of body as well  Reviewed Health History In EMR: Yes  Reviewed Medications In EMR: Yes  Reviewed Allergies In EMR: Yes  Reviewed Surgeries / Procedures: Yes  Date of Onset of Symptoms: 06/14/2012  Guideline(s) Used:  Tick Bite  Disposition Per Guideline:   See Today in Office  Reason For Disposition Reached:   Red ring or bull's-eye rash occurs around a deer tick bite  Advice Given:  N/A  Patient Will Follow Care Advice:  YES  Appointment Scheduled:  06/14/2012 11:30:00 Appointment Scheduled Provider:  Orville Govern

## 2012-06-16 ENCOUNTER — Other Ambulatory Visit: Payer: Medicare Other

## 2012-08-03 ENCOUNTER — Other Ambulatory Visit (INDEPENDENT_AMBULATORY_CARE_PROVIDER_SITE_OTHER): Payer: Medicare Other

## 2012-08-03 DIAGNOSIS — E7800 Pure hypercholesterolemia, unspecified: Secondary | ICD-10-CM

## 2012-08-03 DIAGNOSIS — Z7901 Long term (current) use of anticoagulants: Secondary | ICD-10-CM

## 2012-08-03 DIAGNOSIS — I10 Essential (primary) hypertension: Secondary | ICD-10-CM

## 2012-08-03 LAB — PROTIME-INR
INR: 2.3 ratio — ABNORMAL HIGH (ref 0.8–1.0)
Prothrombin Time: 24.1 s — ABNORMAL HIGH (ref 10.2–12.4)

## 2012-08-03 LAB — LIPID PANEL
HDL: 48.4 mg/dL (ref >39.00)
LDL Cholesterol: 72 mg/dL (ref 0–99)
Total CHOL/HDL Ratio: 3
Triglycerides: 96 mg/dL (ref 0.0–149.0)
VLDL: 19.2 mg/dL (ref 0.0–40.0)

## 2012-08-03 LAB — BASIC METABOLIC PANEL
CO2: 28 mEq/L (ref 19–32)
Calcium: 9.2 mg/dL (ref 8.4–10.5)
Creatinine, Ser: 0.9 mg/dL (ref 0.4–1.5)
GFR: 83.09 mL/min (ref >60.00)

## 2012-08-03 LAB — HEPATIC FUNCTION PANEL
Bilirubin, Direct: 0 mg/dL (ref 0.0–0.3)
Total Bilirubin: 0.6 mg/dL (ref 0.3–1.2)

## 2012-08-10 ENCOUNTER — Ambulatory Visit (INDEPENDENT_AMBULATORY_CARE_PROVIDER_SITE_OTHER): Payer: Medicare Other | Admitting: Internal Medicine

## 2012-08-10 ENCOUNTER — Encounter: Payer: Self-pay | Admitting: Internal Medicine

## 2012-08-10 VITALS — BP 122/60 | HR 69 | Temp 98.0°F | Resp 12 | Ht 65.0 in | Wt 164.5 lb

## 2012-08-10 DIAGNOSIS — I4891 Unspecified atrial fibrillation: Secondary | ICD-10-CM

## 2012-08-10 DIAGNOSIS — I2510 Atherosclerotic heart disease of native coronary artery without angina pectoris: Secondary | ICD-10-CM

## 2012-08-10 DIAGNOSIS — I10 Essential (primary) hypertension: Secondary | ICD-10-CM

## 2012-08-10 DIAGNOSIS — E871 Hypo-osmolality and hyponatremia: Secondary | ICD-10-CM

## 2012-08-10 DIAGNOSIS — D649 Anemia, unspecified: Secondary | ICD-10-CM

## 2012-08-10 DIAGNOSIS — R079 Chest pain, unspecified: Secondary | ICD-10-CM

## 2012-08-10 DIAGNOSIS — E7800 Pure hypercholesterolemia, unspecified: Secondary | ICD-10-CM

## 2012-08-10 NOTE — Progress Notes (Signed)
Subjective:    [name]: Tyler Stout, male    DOB: [birth year], 79 y.o.   MRN: [id number]  HPI 79 year old male with past history of atherosclerotic heart disease s/p bypass graft x 4, hypercholesterolemia, reflux disease, hypertension and gallstone pancreatitis.  He comes in today to follow up on these issues as well as for a complete physical exam.  He states he has noticed some ringing in his ears and some pressure at times.  No headache.  Occasional light headedness.  No significant sinus or allergy symptoms.  Some minimal congestion.  Also reports noticing some occasional chest pressure.  Some of this he has noticed since his heart surgery.  Some windedness with exertion.  No acid reflux.  Bowels stable.  No abdominal pain or cramping.  We discussed increased stress and anxiety.  Question if increased anxiety contributing to some of his symptoms.     Past Medical History  Diagnosis Date  . CAD (coronary artery disease)     s/p bypass graft x 4 (1991)  . GERD (gastroesophageal reflux disease)   . Atrial fibrillation   . Anemia     iron deficiency  . Hypertension   . Hypercholesterolemia   . Pancreatitis     x2 (1994 and 1998), presumed to be gallstone pancreatitis.  s/p ERCP and sphincterotomy  . Bilateral renal cysts     worked up by Dr Lonna Cobb  . Glaucoma   . BPH (benign prostatic hypertrophy)   . Diverticulosis   . Arthritis     Current Outpatient Prescriptions on File Prior to Visit  Medication Sig Dispense Refill  . atorvastatin (LIPITOR) 20 MG tablet Take 1 tablet (20 mg total) by mouth daily.  90 tablet  3  . Calcium Carbonate-Vitamin D (CALCIUM 600+D) 600-400 MG-UNIT per tablet Take 1 tablet by mouth daily.      . Diltiazem HCl Coated Beads (CARDIZEM CD PO) Take 120 mg by mouth daily. As directed      . fish oil-omega-3 fatty acids 1000 MG capsule Take 1 g by mouth daily.      . fluticasone (FLONASE) 50 MCG/ACT nasal spray Place 2 sprays into the nose daily. 2 Sprays  in each nostril daily      . hydrochlorothiazide (MICROZIDE) 12.5 MG capsule Take 1 capsule (12.5 mg total) by mouth daily.  30 capsule  2  . irbesartan-hydrochlorothiazide (AVALIDE) 300-12.5 MG per tablet Take 1 tablet by mouth daily.  90 tablet  3  . latanoprost (XALATAN) 0.005 % ophthalmic solution Place 1 drop into both eyes daily. 1 drop in each eye      . metoprolol tartrate (LOPRESSOR) 25 MG tablet Take 25 mg by mouth 2 (two) times daily.      . Multiple Vitamin (MULTI-VITAMIN DAILY PO) Take by mouth.      Marland Kitchen omeprazole (PRILOSEC) 20 MG capsule Take 20 mg by mouth daily.      . sertraline (ZOLOFT) 25 MG tablet Take 1 tablet (25 mg total) by mouth daily.  30 tablet  2  . Tamsulosin HCl (FLOMAX PO) Take by mouth 2 (two) times daily.      . vitamin C (ASCORBIC ACID) 500 MG tablet Take 500 mg by mouth daily.      Marland Kitchen warfarin (COUMADIN) 5 MG tablet 5 mg. As directed       No current facility-administered medications on file prior to visit.    Review of Systems Patient denies any headache.  Some intermittent light headedness.  Some ringing and pressure in his ears.  No palpitations.  Some increased pressure in his chest as outlined.  No cough or congestion.  Some windedness with exertion.   No nausea or vomiting.  No abdominal pain or cramping.  No bowel change, such as diarrhea, constipation, BRBPR or melana.  No urine change.   Increased anxiety as outlined.       Objective:   Physical Exam  Filed Vitals:   08/10/12 1325  BP: 122/60  Pulse: 69  Temp: 98 F (36.7 C)  Resp: 12   Blood pressure:  138/72 lying, 140/72 standing  79 year old male in no acute distress.  HEENT:  Nares - clear.  Oropharynx - without lesions. NECK:  Supple.  Nontender.  No audible carotid bruit.  HEART:  Appears to be regular.   LUNGS:  No crackles or wheezing audible.  Respirations even and unlabored.   RADIAL PULSE:  Equal bilaterally.  ABDOMEN:  Soft.  Nontender.  Bowel sounds present and normal.  No  audible abdominal bruit.  GU:  Performed by urology.   EXTREMITIES:  No increased edema present.  DP pulses palpable and equal bilaterally.         Assessment & Plan:  INCREASED STRESS/ANXIETY.  Discussed at length with the patient.  Question of minimal depression.  Off zoloft.  Discussed restarting.  Consider low dose - 25mg  zoloft and then increasing as tolerated.     DIZZINESS/lIGHT HEADEDNESS.   Unclear as to the exact etiology.  Blood pressure looks good today off the hctz.  Not orthostatic.  Discussed further w/up including ENT and carotid ultrasound or neurological evaluation.  Discussed further cardiac w/up.  Agreed to earlier f/u with cardiology.  Wants to hold on any further w/up at this time.  Follow.     GI.  Has done better.  No problems with increased gas.  Taking a probiotic daily.  Follow.  Colonoscopy 08/11/08 - diverticulosis and internal hemorrhoids.     HEALTH MAINTENANCE.  Physical today.  Prostate and PSAs followed by Dr Lonna Cobb.  Colonoscopy 08/11/08 revealed diverticulosis and internal hemorrhoids.

## 2012-08-14 ENCOUNTER — Encounter: Payer: Self-pay | Admitting: Internal Medicine

## 2012-08-14 ENCOUNTER — Telehealth: Payer: Self-pay | Admitting: Internal Medicine

## 2012-08-14 NOTE — Assessment & Plan Note (Signed)
Remain off hctz.  Will follow sodium. Improved last check.

## 2012-08-14 NOTE — Assessment & Plan Note (Signed)
On Vytorin.  Low cholesterol diet and exercise.  Follow.   

## 2012-08-14 NOTE — Assessment & Plan Note (Signed)
Rate controlled on current meds.  On coumadin.  Dr Fath following.    

## 2012-08-14 NOTE — Assessment & Plan Note (Signed)
Hgb has been stable.  Follow cbc.  Has seen GI.     

## 2012-08-14 NOTE — Assessment & Plan Note (Addendum)
Blood pressures on outside checks averaging 120-140s/60-70s.    Not orthostatic on exam.  Will continue the avalide.  Will remain off the hctz.  Follow sodium.

## 2012-08-14 NOTE — Assessment & Plan Note (Addendum)
History of CAD s/p CABG.  Sees cardiology regularly - Dr Lady Gary.  Negative functional study 01/18/10.  Symptoms as outlined.  Some chest pressure and windedness as outlined.  EKG today reviewed by cardiology.  No acute change from his previous EKG.  Will refer back to cardiology for evaluation.  Needs earlier appt.  Continue risk factor modification.  Follow closely.  If any change or worsening problems, he is to be evaluated immediately.

## 2012-08-14 NOTE — Telephone Encounter (Signed)
Pt needs a follow up appt in one month ( ).  Thanks.

## 2012-08-17 NOTE — Telephone Encounter (Signed)
Appointment thur 10/07/12 @ 1-:30  Pt aware of appointment

## 2012-10-04 ENCOUNTER — Ambulatory Visit: Payer: Medicare Other | Admitting: Internal Medicine

## 2012-10-07 ENCOUNTER — Encounter: Payer: Self-pay | Admitting: Internal Medicine

## 2012-10-07 ENCOUNTER — Ambulatory Visit (INDEPENDENT_AMBULATORY_CARE_PROVIDER_SITE_OTHER): Payer: Medicare Other | Admitting: Internal Medicine

## 2012-10-07 VITALS — BP 110/60 | HR 54 | Temp 97.7°F | Ht 65.0 in | Wt 154.0 lb

## 2012-10-07 DIAGNOSIS — I4891 Unspecified atrial fibrillation: Secondary | ICD-10-CM

## 2012-10-07 DIAGNOSIS — R42 Dizziness and giddiness: Secondary | ICD-10-CM

## 2012-10-07 DIAGNOSIS — I10 Essential (primary) hypertension: Secondary | ICD-10-CM

## 2012-10-07 DIAGNOSIS — E7800 Pure hypercholesterolemia, unspecified: Secondary | ICD-10-CM

## 2012-10-07 DIAGNOSIS — D649 Anemia, unspecified: Secondary | ICD-10-CM

## 2012-10-07 DIAGNOSIS — I2510 Atherosclerotic heart disease of native coronary artery without angina pectoris: Secondary | ICD-10-CM

## 2012-10-07 DIAGNOSIS — E871 Hypo-osmolality and hyponatremia: Secondary | ICD-10-CM

## 2012-10-10 ENCOUNTER — Encounter: Payer: Self-pay | Admitting: Internal Medicine

## 2012-10-10 NOTE — Assessment & Plan Note (Signed)
Remain off hctz.  Will follow sodium. Improved last check.      

## 2012-10-10 NOTE — Assessment & Plan Note (Signed)
Rate controlled on current meds.  On coumadin.  Dr Fath following.    

## 2012-10-10 NOTE — Assessment & Plan Note (Signed)
Hgb has been stable.  Follow cbc.  Has seen GI.

## 2012-10-10 NOTE — Assessment & Plan Note (Signed)
Blood pressures on outside checks averaging 120-140s/60-70s.    Not orthostatic on exam.  Will continue the avalide.  Will remain off the hctz.  Follow sodium.     

## 2012-10-10 NOTE — Assessment & Plan Note (Signed)
History of CAD s/p CABG.  Sees cardiology regularly - Dr Lady Gary.  Negative functional study 01/18/10.  Just saw cardiology.  Felt things were stable.

## 2012-10-10 NOTE — Progress Notes (Signed)
Subjective:    [name]: Tyler Stout, male    DOB: 07-08-1925, 79 y.o.   MRN: [id number]  HPI 79 year old male with past history of atherosclerotic heart disease s/p bypass graft x 4, hypercholesterolemia, reflux disease, hypertension and gallstone pancreatitis.  He comes in today for a scheduled follow up.  No headache.  Occasional light headedness.  No significant sinus or allergy symptoms.  Some windedness with exertion.  Stable.  Has been present since his bypass surgery.  Just recently evaluated by cardiology.  Felt from a cardiac standpoint - stable.  No acid reflux.  Bowels stable.  No abdominal pain or cramping.  We discussed increased stress and anxiety.  Question if increased anxiety contributing to some of his symptoms. Just saw Dr Wyn Quaker.  Everything stable.      Past Medical History  Diagnosis Date  . CAD (coronary artery disease)     s/p bypass graft x 4 (1991)  . GERD (gastroesophageal reflux disease)   . Atrial fibrillation   . Anemia     iron deficiency  . Hypertension   . Hypercholesterolemia   . Pancreatitis     x2 (1994 and 1998), presumed to be gallstone pancreatitis.  s/p ERCP and sphincterotomy  . Bilateral renal cysts     worked up by Dr Lonna Cobb  . Glaucoma   . BPH (benign prostatic hypertrophy)   . Diverticulosis   . Arthritis     Current Outpatient Prescriptions on File Prior to Visit  Medication Sig Dispense Refill  . atorvastatin (LIPITOR) 20 MG tablet Take 1 tablet (20 mg total) by mouth daily.  90 tablet  3  . Calcium Carbonate-Vitamin D (CALCIUM 600+D) 600-400 MG-UNIT per tablet Take 1 tablet by mouth daily.      . Diltiazem HCl Coated Beads (CARDIZEM CD PO) Take 120 mg by mouth daily. As directed      . fish oil-omega-3 fatty acids 1000 MG capsule Take 1 g by mouth daily.      . fluticasone (FLONASE) 50 MCG/ACT nasal spray Place 2 sprays into the nose daily. 2 Sprays in each nostril daily      . hydrochlorothiazide (MICROZIDE) 12.5 MG capsule Take 1  capsule (12.5 mg total) by mouth daily.  30 capsule  2  . irbesartan-hydrochlorothiazide (AVALIDE) 300-12.5 MG per tablet Take 1 tablet by mouth daily.  90 tablet  3  . latanoprost (XALATAN) 0.005 % ophthalmic solution Place 1 drop into both eyes daily. 1 drop in each eye      . Multiple Vitamin (MULTI-VITAMIN DAILY PO) Take by mouth.      Marland Kitchen omeprazole (PRILOSEC) 20 MG capsule Take 20 mg by mouth daily.      . sertraline (ZOLOFT) 25 MG tablet Take 1 tablet (25 mg total) by mouth daily.  30 tablet  2  . Tamsulosin HCl (FLOMAX PO) Take by mouth as needed.       . vitamin C (ASCORBIC ACID) 500 MG tablet Take 500 mg by mouth daily.      Marland Kitchen warfarin (COUMADIN) 5 MG tablet 5 mg. As directed      . metoprolol tartrate (LOPRESSOR) 25 MG tablet Take 25 mg by mouth 2 (two) times daily.       No current facility-administered medications on file prior to visit.    Review of Systems Patient denies any headache.  Some intermittent light headedness.  No significant sinus or allergy symptoms.   No palpitations.  No chest pain or tightness.  No cough or congestion.  Some windedness with exertion.  Stable.   No nausea or vomiting.  No abdominal pain or cramping.  No bowel change, such as diarrhea, constipation, BRBPR or melana.  No urine change.   Increased anxiety as outlined.  Blood pressures averaging 120-140/60s.       Objective:   Physical Exam  Filed Vitals:   10/07/12 1023  BP: 110/60  Pulse: 54  Temp: 97.7 F (36.5 C)   Blood pressure:  132/72.  Not orthostatic on exam.    79 year old male in no acute distress.  HEENT:  Nares - clear.  Oropharynx - without lesions. NECK:  Supple.  Nontender.  No audible carotid bruit.  HEART:  Appears to be regular.   LUNGS:  No crackles or wheezing audible.  Respirations even and unlabored.   RADIAL PULSE:  Equal bilaterally.  ABDOMEN:  Soft.  Nontender.  Bowel sounds present and normal.  No audible abdominal bruit.  GU:  Performed by urology.    EXTREMITIES:  No increased edema present.  DP pulses palpable and equal bilaterally.         Assessment & Plan:  INCREASED STRESS/ANXIETY.  Discussed at length with the patient.  Question of minimal depression.  Consider low dose - 25mg  zoloft and then increasing as tolerated.     DIZZINESS/lIGHT HEADEDNESS.   Unclear as to the exact etiology.  Blood pressure looks good today off the hctz.  Not orthostatic.  Just saw cardiology.  Felt from a cardiac standpoint things were stable.  Just saw Dr Wyn Quaker.  Carotid ultrasound - unchanged.  Discussed further w/up including ENT and/or neurological evaluation.  Will refer to neurology for further evaluation.      GI.  Has done better.  No problems with increased gas.  Taking a probiotic daily.  Follow.  Colonoscopy 08/11/08 - diverticulosis and internal hemorrhoids.     HEALTH MAINTENANCE.  Physical 08/10/12.  Prostate and PSAs followed by Dr Lonna Cobb.  Colonoscopy 08/11/08 revealed diverticulosis and internal hemorrhoids.

## 2012-10-10 NOTE — Assessment & Plan Note (Signed)
On Vytorin.  Low cholesterol diet and exercise.  Follow.   

## 2012-10-25 ENCOUNTER — Ambulatory Visit: Payer: Self-pay | Admitting: Neurology

## 2012-11-25 ENCOUNTER — Ambulatory Visit: Payer: Self-pay | Admitting: Adult Health

## 2012-11-25 ENCOUNTER — Encounter: Payer: Self-pay | Admitting: Adult Health

## 2012-11-25 ENCOUNTER — Ambulatory Visit (INDEPENDENT_AMBULATORY_CARE_PROVIDER_SITE_OTHER): Payer: Medicare Other | Admitting: Adult Health

## 2012-11-25 ENCOUNTER — Encounter (INDEPENDENT_AMBULATORY_CARE_PROVIDER_SITE_OTHER): Payer: Self-pay

## 2012-11-25 ENCOUNTER — Telehealth: Payer: Self-pay | Admitting: *Deleted

## 2012-11-25 VITALS — BP 130/72 | HR 72 | Temp 97.6°F | Resp 14 | Wt 155.0 lb

## 2012-11-25 DIAGNOSIS — M7989 Other specified soft tissue disorders: Secondary | ICD-10-CM

## 2012-11-25 NOTE — Progress Notes (Signed)
  Subjective:    [name]: Tyler Stout, male    DOB: 1925/12/25, 79 y.o.   MRN: [id number]  HPI  Pt is an 79 year old male who presents to clinic with right swollen leg x2 weeks. Patient has a history of atrial fibrillation and currently on Coumadin 5 mg every afternoon. Last INR 2.4 on 11/24/12. Right calf 13.5 inches round Left calf 13 inches round   Review of Systems  Respiratory: Negative.   Cardiovascular: Positive for leg swelling.       Objective:   Physical Exam  Constitutional: He is oriented to person, place, and time.  Cardiovascular: Normal rate, regular rhythm and normal heart sounds.   Pulmonary/Chest: Effort normal and breath sounds normal.  Musculoskeletal: He exhibits edema.  RLE (calf) 13.5 inches; LLE (calf) 13 inches  Neurological: He is alert and oriented to person, place, and time.  Skin: Skin is warm and dry.  Psychiatric: He has a normal mood and affect. His behavior is normal. Judgment and thought content normal.          Assessment & Plan:

## 2012-11-25 NOTE — Telephone Encounter (Signed)
C/O swelling in right calf x several week. No pain. Area feels hard-please advise

## 2012-11-25 NOTE — Telephone Encounter (Signed)
If increased swelling and hard - needs eval today.  I am unable to work in this pm due to already work in.

## 2012-11-25 NOTE — Telephone Encounter (Signed)
Pt coming in to see Raquel today @ 3:45

## 2012-11-25 NOTE — Assessment & Plan Note (Signed)
RLE swelling. Pedal pulse faint. Non palpable posterior tibial on the right. Pt is on coumadin 5 mg. Sending for ultrasound to r/o DVT. His INR was therapeutic yesterday 2.4

## 2012-11-30 ENCOUNTER — Other Ambulatory Visit (INDEPENDENT_AMBULATORY_CARE_PROVIDER_SITE_OTHER): Payer: Medicare Other

## 2012-11-30 DIAGNOSIS — I10 Essential (primary) hypertension: Secondary | ICD-10-CM

## 2012-11-30 DIAGNOSIS — D649 Anemia, unspecified: Secondary | ICD-10-CM

## 2012-11-30 DIAGNOSIS — E7800 Pure hypercholesterolemia, unspecified: Secondary | ICD-10-CM

## 2012-11-30 LAB — BASIC METABOLIC PANEL
BUN: 18 mg/dL (ref 6–23)
CO2: 26 mEq/L (ref 19–32)
Chloride: 99 mEq/L (ref 96–112)
Creatinine, Ser: 1 mg/dL (ref 0.4–1.5)
GFR: 76.29 mL/min (ref >60.00)
Glucose, Bld: 104 mg/dL — ABNORMAL HIGH (ref 70–99)
Potassium: 4.6 mEq/L (ref 3.5–5.1)
Sodium: 132 mEq/L — ABNORMAL LOW (ref 135–145)

## 2012-11-30 LAB — HEPATIC FUNCTION PANEL
ALT: 18 U/L (ref 0–53)
Alkaline Phosphatase: 62 U/L (ref 39–117)
Bilirubin, Direct: 0.1 mg/dL (ref 0.0–0.3)
Total Bilirubin: 0.6 mg/dL (ref 0.3–1.2)
Total Protein: 6.7 g/dL (ref 6.0–8.3)

## 2012-11-30 LAB — CBC WITH DIFFERENTIAL/PLATELET
Basophils Absolute: 0 10*3/uL (ref 0.0–0.1)
Basophils Relative: 0.3 % (ref 0.0–3.0)
Eosinophils Absolute: 0.1 10*3/uL (ref 0.0–0.7)
Eosinophils Relative: 1.7 % (ref 0.0–5.0)
HCT: 34 % — ABNORMAL LOW (ref 39.0–52.0)
Hemoglobin: 11.5 g/dL — ABNORMAL LOW (ref 13.0–17.0)
Lymphocytes Relative: 20.2 % (ref 12.0–46.0)
Lymphs Abs: 1.4 10*3/uL (ref 0.7–4.0)
MCHC: 33.9 g/dL (ref 30.0–36.0)
MCV: 92.4 fl (ref 78.0–100.0)
Monocytes Absolute: 0.8 10*3/uL (ref 0.1–1.0)
Monocytes Relative: 12 % (ref 3.0–12.0)
Neutro Abs: 4.4 10*3/uL (ref 1.4–7.7)
Neutrophils Relative %: 65.8 % (ref 43.0–77.0)
Platelets: 249 10*3/uL (ref 150.0–400.0)
RBC: 3.68 Mil/uL — ABNORMAL LOW (ref 4.22–5.81)
RDW: 14 % (ref 11.5–14.6)
WBC: 6.8 10*3/uL (ref 4.5–10.5)

## 2012-11-30 LAB — LIPID PANEL
Cholesterol: 126 mg/dL (ref 0–200)
HDL: 45.7 mg/dL (ref >39.00)
LDL Cholesterol: 61 mg/dL (ref 0–99)
Total CHOL/HDL Ratio: 3
Triglycerides: 96 mg/dL (ref 0.0–149.0)
VLDL: 19.2 mg/dL (ref 0.0–40.0)

## 2012-11-30 LAB — FERRITIN: Ferritin: 23.5 ng/mL (ref 22.0–322.0)

## 2012-12-07 ENCOUNTER — Ambulatory Visit (INDEPENDENT_AMBULATORY_CARE_PROVIDER_SITE_OTHER): Payer: Medicare Other | Admitting: Internal Medicine

## 2012-12-07 ENCOUNTER — Encounter: Payer: Self-pay | Admitting: Internal Medicine

## 2012-12-07 VITALS — BP 118/60 | HR 62 | Temp 97.9°F | Ht 65.0 in | Wt 157.5 lb

## 2012-12-07 DIAGNOSIS — E7800 Pure hypercholesterolemia, unspecified: Secondary | ICD-10-CM

## 2012-12-07 DIAGNOSIS — D649 Anemia, unspecified: Secondary | ICD-10-CM

## 2012-12-07 DIAGNOSIS — I4891 Unspecified atrial fibrillation: Secondary | ICD-10-CM

## 2012-12-07 DIAGNOSIS — I10 Essential (primary) hypertension: Secondary | ICD-10-CM

## 2012-12-07 DIAGNOSIS — E871 Hypo-osmolality and hyponatremia: Secondary | ICD-10-CM

## 2012-12-07 DIAGNOSIS — I2510 Atherosclerotic heart disease of native coronary artery without angina pectoris: Secondary | ICD-10-CM

## 2012-12-07 LAB — CBC WITH DIFFERENTIAL/PLATELET
Basophils Relative: 0.5 % (ref 0.0–3.0)
HCT: 33.1 % — ABNORMAL LOW (ref 39.0–52.0)
Hemoglobin: 11.3 g/dL — ABNORMAL LOW (ref 13.0–17.0)
Lymphocytes Relative: 18 % (ref 12.0–46.0)
Lymphs Abs: 1.7 10*3/uL (ref 0.7–4.0)
MCHC: 34 g/dL (ref 30.0–36.0)
MCV: 90.9 fl (ref 78.0–100.0)
Monocytes Absolute: 1.1 10*3/uL — ABNORMAL HIGH (ref 0.1–1.0)
Monocytes Relative: 11.8 % (ref 3.0–12.0)
Neutro Abs: 6.4 10*3/uL (ref 1.4–7.7)
Platelets: 276 10*3/uL (ref 150.0–400.0)
RBC: 3.64 Mil/uL — ABNORMAL LOW (ref 4.22–5.81)
RDW: 13.7 % (ref 11.5–14.6)

## 2012-12-07 LAB — IBC PANEL
Iron: 26 ug/dL — ABNORMAL LOW (ref 42–165)
Saturation Ratios: 6.6 % — ABNORMAL LOW (ref 20.0–50.0)
Transferrin: 283.2 mg/dL (ref 212.0–360.0)

## 2012-12-07 LAB — FERRITIN: Ferritin: 21.3 ng/mL — ABNORMAL LOW (ref 22.0–322.0)

## 2012-12-07 NOTE — Assessment & Plan Note (Addendum)
Remain off hctz.  Will follow sodium.

## 2012-12-07 NOTE — Progress Notes (Signed)
Subjective:    [name]: Tyler Stout, male    DOB: [birth year], 79 y.o.   MRN: [id number]  HPI 79 year old male with past history of atherosclerotic heart disease s/p bypass graft x 4, hypercholesterolemia, reflux disease, hypertension and gallstone pancreatitis.  He comes in today for a scheduled follow up.  No headache.  No significant light headedness reported.  No significant sinus or allergy symptoms.  Just recently evaluated by cardiology.  Felt from a cardiac standpoint - stable.  No acid reflux.  Bowels stable.  Does report soft stool after eating.  States this occurs 2-3x/day.  Occurs after eating.  This is stable.  Not new.  No abdominal pain or cramping.  We discussed increased stress and anxiety.  Feels he is handling things relatively well.  Saw Dr Wyn Quaker.  Everything stable.      Past Medical History  Diagnosis Date  . CAD (coronary artery disease)     s/p bypass graft x 4 (1991)  . GERD (gastroesophageal reflux disease)   . Atrial fibrillation   . Anemia     iron deficiency  . Hypertension   . Hypercholesterolemia   . Pancreatitis     x2 (1994 and 1998), presumed to be gallstone pancreatitis.  s/p ERCP and sphincterotomy  . Bilateral renal cysts     worked up by Dr Lonna Cobb  . Glaucoma   . BPH (benign prostatic hypertrophy)   . Diverticulosis   . Arthritis     Current Outpatient Prescriptions on File Prior to Visit  Medication Sig Dispense Refill  . atorvastatin (LIPITOR) 20 MG tablet Take 1 tablet (20 mg total) by mouth daily.  90 tablet  3  . Calcium Carbonate-Vitamin D (CALCIUM 600+D) 600-400 MG-UNIT per tablet Take 1 tablet by mouth daily.      . Diltiazem HCl Coated Beads (CARDIZEM CD PO) Take 120 mg by mouth daily. As directed      . fish oil-omega-3 fatty acids 1000 MG capsule Take 1 g by mouth daily.      . fluticasone (FLONASE) 50 MCG/ACT nasal spray Place 2 sprays into the nose daily. 2 Sprays in each nostril daily      . hydrochlorothiazide (MICROZIDE) 12.5  MG capsule Take 1 capsule (12.5 mg total) by mouth daily.  30 capsule  2  . irbesartan-hydrochlorothiazide (AVALIDE) 300-12.5 MG per tablet Take 1 tablet by mouth daily.  90 tablet  3  . latanoprost (XALATAN) 0.005 % ophthalmic solution Place 1 drop into both eyes daily. 1 drop in each eye      . metoprolol tartrate (LOPRESSOR) 25 MG tablet Take 25 mg by mouth 2 (two) times daily.      . Multiple Vitamin (MULTI-VITAMIN DAILY PO) Take by mouth.      Marland Kitchen omeprazole (PRILOSEC) 20 MG capsule Take 20 mg by mouth daily.      . sertraline (ZOLOFT) 25 MG tablet Take 1 tablet (25 mg total) by mouth daily.  30 tablet  2  . Tamsulosin HCl (FLOMAX PO) Take by mouth as needed.       . vitamin C (ASCORBIC ACID) 500 MG tablet Take 500 mg by mouth daily.      Marland Kitchen warfarin (COUMADIN) 5 MG tablet Take 5 mg by mouth at bedtime. As directed       No current facility-administered medications on file prior to visit.    Review of Systems Patient denies any headache.  No light headedness reported.   No significant sinus  or allergy symptoms.   No palpitations.  No chest pain or tightness.  No cough or congestion.  Breathing stable.   No nausea or vomiting.  No abdominal pain or cramping.  No bowel change, such as diarrhea, constipation, BRBPR or melana.  No urine change.   Increased anxiety as outlined.  Blood pressures averaging 120-140/60s.       Objective:   Physical Exam  Filed Vitals:   12/07/12 0957  BP: 118/60  Pulse: 62  Temp: 97.9 F (36.6 C)   Blood pressure:  12/34   79 year old male in no acute distress.  HEENT:  Nares - clear.  Oropharynx - without lesions. NECK:  Supple.  Nontender.  No audible carotid bruit.  HEART:  Appears to be regular.   LUNGS:  No crackles or wheezing audible.  Respirations even and unlabored.   RADIAL PULSE:  Equal bilaterally.  ABDOMEN:  Soft.  Nontender.  Bowel sounds present and normal.  No audible abdominal bruit.  GU:  Performed by urology.   EXTREMITIES:  No  increased edema present.  DP pulses palpable and equal bilaterally.         Assessment & Plan:  INCREASED STRESS/ANXIETY.  Appears to be stable.     DIZZINESS/lIGHT HEADEDNESS.   Unclear as to the exact etiology.  Blood pressure looks good today off the hctz.  Not orthostatic.  Just saw cardiology.  Felt from a cardiac standpoint things were stable.  Just saw Dr Wyn Quaker.  Carotid ultrasound - unchanged.  Saw neurology.  Reported things checked out fine.  Follow.     GI.  Has done better.  No problems with increased gas.  Taking a probiotic daily.  Follow.  Colonoscopy 08/11/08 - diverticulosis and internal hemorrhoids.     HEALTH MAINTENANCE.  Physical 08/10/12.  Prostate and PSAs followed by Dr Lonna Cobb.  Colonoscopy 08/11/08 revealed diverticulosis and internal hemorrhoids.

## 2012-12-07 NOTE — Assessment & Plan Note (Addendum)
On Vytorin.  Low cholesterol diet and exercise.  Follow.   

## 2012-12-07 NOTE — Assessment & Plan Note (Addendum)
Blood pressures on outside checks averaging 120-140s/60-70s.    Not orthostatic on exam.  Will continue the avalide.  Will remain off the hctz.  Follow sodium.

## 2012-12-07 NOTE — Progress Notes (Signed)
Pre-visit discussion using our clinic review tool. No additional management support is needed unless otherwise documented below in the visit note.  

## 2012-12-08 ENCOUNTER — Other Ambulatory Visit: Payer: Self-pay | Admitting: Internal Medicine

## 2012-12-08 DIAGNOSIS — D649 Anemia, unspecified: Secondary | ICD-10-CM

## 2012-12-08 NOTE — Progress Notes (Signed)
Order placed for f/u labs.  

## 2012-12-11 ENCOUNTER — Encounter: Payer: Self-pay | Admitting: Internal Medicine

## 2012-12-11 NOTE — Assessment & Plan Note (Signed)
Rate controlled on current meds.  On coumadin.  Dr Fath following.    

## 2012-12-11 NOTE — Assessment & Plan Note (Signed)
Hgb has been stable.  Decreased on last check.  Recheck cbc, iron studies and B12.  Follow.  Further w/up pending.

## 2012-12-11 NOTE — Assessment & Plan Note (Signed)
History of CAD s/p CABG.  Sees cardiology regularly - Dr Fath.  Negative functional study 01/18/10.  Just saw cardiology.  Felt things were stable.     

## 2012-12-13 ENCOUNTER — Telehealth: Payer: Self-pay | Admitting: Internal Medicine

## 2012-12-13 NOTE — Telephone Encounter (Signed)
Please advise 

## 2012-12-13 NOTE — Telephone Encounter (Signed)
Spoke with patients daughter and scheduled him for 12/16/12 at 11:45

## 2012-12-13 NOTE — Telephone Encounter (Signed)
Please call and check on pt.  Need records from Hospitalization.  Is he taking his abx with food?  I can see him on 12/16/12 at 11:45 if no one else scheduled to come in then.  Let me know if problems before.

## 2012-12-13 NOTE — Telephone Encounter (Signed)
Tyler Stout out of town for Thanksgiving.  Had to be seen in [place] @ [facility].  Has blood work copy and discharge papers.  Was kept overnight for possible beginnings of pneumonia. Put on Levaquin 750 mg, which is hurting his stomach.  Was advised to be seen this week by PCP.  No 30 min available.  Please advise.

## 2012-12-14 NOTE — Telephone Encounter (Signed)
I spoke with daughter this morning & reminded her to make sure that Tyler Stout brings in his lab results & discharge papers from his hospital visit. She also stated that he started taking the Abx with food yesterday and that has helped so far.

## 2012-12-16 ENCOUNTER — Encounter: Payer: Self-pay | Admitting: Internal Medicine

## 2012-12-16 ENCOUNTER — Ambulatory Visit (INDEPENDENT_AMBULATORY_CARE_PROVIDER_SITE_OTHER): Payer: Medicare Other | Admitting: Internal Medicine

## 2012-12-16 VITALS — BP 130/70 | HR 69 | Temp 97.9°F | Ht 65.0 in | Wt 152.5 lb

## 2012-12-16 DIAGNOSIS — I2510 Atherosclerotic heart disease of native coronary artery without angina pectoris: Secondary | ICD-10-CM

## 2012-12-16 DIAGNOSIS — I4891 Unspecified atrial fibrillation: Secondary | ICD-10-CM

## 2012-12-16 DIAGNOSIS — E871 Hypo-osmolality and hyponatremia: Secondary | ICD-10-CM

## 2012-12-16 DIAGNOSIS — J209 Acute bronchitis, unspecified: Secondary | ICD-10-CM

## 2012-12-16 DIAGNOSIS — D649 Anemia, unspecified: Secondary | ICD-10-CM

## 2012-12-16 DIAGNOSIS — I10 Essential (primary) hypertension: Secondary | ICD-10-CM

## 2012-12-16 DIAGNOSIS — D72829 Elevated white blood cell count, unspecified: Secondary | ICD-10-CM

## 2012-12-16 LAB — CBC WITH DIFFERENTIAL/PLATELET
Eosinophils Absolute: 0.1 10*3/uL (ref 0.0–0.7)
Eosinophils Relative: 1.1 % (ref 0.0–5.0)
Lymphocytes Relative: 17.4 % (ref 12.0–46.0)
MCHC: 33.3 g/dL (ref 30.0–36.0)
Monocytes Relative: 10.8 % (ref 3.0–12.0)
Neutro Abs: 5.3 10*3/uL (ref 1.4–7.7)
Neutrophils Relative %: 70.4 % (ref 43.0–77.0)
Platelets: 370 10*3/uL (ref 150.0–400.0)
WBC: 7.5 10*3/uL (ref 4.5–10.5)

## 2012-12-16 LAB — BASIC METABOLIC PANEL
BUN: 17 mg/dL (ref 6–23)
Creatinine, Ser: 0.9 mg/dL (ref 0.4–1.5)
GFR: 87.39 mL/min (ref >60.00)
Glucose, Bld: 104 mg/dL — ABNORMAL HIGH (ref 70–99)
Potassium: 4.4 mEq/L (ref 3.5–5.1)

## 2012-12-16 NOTE — Progress Notes (Signed)
Pre-visit discussion using our clinic review tool. No additional management support is needed unless otherwise documented below in the visit note.  

## 2012-12-16 NOTE — Patient Instructions (Signed)
Start ferrous sulfate 325mg  - one tablet per day

## 2012-12-19 ENCOUNTER — Encounter: Payer: Self-pay | Admitting: Internal Medicine

## 2012-12-19 NOTE — Assessment & Plan Note (Signed)
Sodium 124 while in the hospital.  Was given IVFs.  Infection better.  Eating better.  Recheck sodium and electrolytes today.

## 2012-12-19 NOTE — Progress Notes (Signed)
Subjective:    [name]: Tyler Stout, male    DOB: Nov 15, 1925, 79 y.o.   MRN: [id number]  HPI 79 year old male with past history of atherosclerotic heart disease s/p bypass graft x 4, hypercholesterolemia, reflux disease, hypertension and gallstone pancreatitis.  He comes in today as a work in for a hospital follow up.  Was evaluated on 12/11/12 at [facility].  [place]  Started having increased cough and congestion.  Developed shaking chills. To ER.  Had labs and cxr.  States cxr did not reveal pneumonia.  Was observed overnight.  Given abx and IVFs.  Was told had bronchitis.  On levaquin.  Feels better.  Cough is better.  Is eating better.  Breathing stable. Bowels stable.      Past Medical History  Diagnosis Date  . CAD (coronary artery disease)     s/p bypass graft x 4 (1991)  . GERD (gastroesophageal reflux disease)   . Atrial fibrillation   . Anemia     iron deficiency  . Hypertension   . Hypercholesterolemia   . Pancreatitis     x2 (1994 and 1998), presumed to be gallstone pancreatitis.  s/p ERCP and sphincterotomy  . Bilateral renal cysts     worked up by Dr Lonna Cobb  . Glaucoma   . BPH (benign prostatic hypertrophy)   . Diverticulosis   . Arthritis     Current Outpatient Prescriptions on File Prior to Visit  Medication Sig Dispense Refill  . atorvastatin (LIPITOR) 20 MG tablet Take 1 tablet (20 mg total) by mouth daily.  90 tablet  3  . Calcium Carbonate-Vitamin D (CALCIUM 600+D) 600-400 MG-UNIT per tablet Take 1 tablet by mouth daily.      . Diltiazem HCl Coated Beads (CARDIZEM CD PO) Take 120 mg by mouth daily. As directed      . fish oil-omega-3 fatty acids 1000 MG capsule Take 1 g by mouth daily.      . fluticasone (FLONASE) 50 MCG/ACT nasal spray Place 2 sprays into the nose daily. 2 Sprays in each nostril daily      . hydrochlorothiazide (MICROZIDE) 12.5 MG capsule Take 1 capsule (12.5 mg total) by mouth daily.  30 capsule  2  .  irbesartan-hydrochlorothiazide (AVALIDE) 300-12.5 MG per tablet Take 1 tablet by mouth daily.  90 tablet  3  . latanoprost (XALATAN) 0.005 % ophthalmic solution Place 1 drop into both eyes daily. 1 drop in each eye      . metoprolol tartrate (LOPRESSOR) 25 MG tablet Take 25 mg by mouth 2 (two) times daily.      . Multiple Vitamin (MULTI-VITAMIN DAILY PO) Take by mouth.      Marland Kitchen omeprazole (PRILOSEC) 20 MG capsule Take 20 mg by mouth daily.      . sertraline (ZOLOFT) 25 MG tablet Take 1 tablet (25 mg total) by mouth daily.  30 tablet  2  . Tamsulosin HCl (FLOMAX PO) Take by mouth as needed.       . vitamin C (ASCORBIC ACID) 500 MG tablet Take 500 mg by mouth daily.      Marland Kitchen warfarin (COUMADIN) 5 MG tablet Take 5 mg by mouth at bedtime. As directed       No current facility-administered medications on file prior to visit.    Review of Systems Patient denies any headache.  No light headedness reported.   Cough and congestion better.  No palpitations.  No chest pain or tightness.   Breathing stable.  No nausea or vomiting.  No abdominal pain or cramping.  No bowel change, such as diarrhea, constipation, BRBPR or melana.  No urine change.  Taking abx.  Tolerating better since taking with food.  Still some fatigue, but better.       Objective:   Physical Exam  Filed Vitals:   12/16/12 1209  BP: 130/70  Pulse: 69  Temp: 97.9 F (27.40 C)   79 year old male in no acute distress.  HEENT:  Nares - clear.  Oropharynx - without lesions. NECK:  Supple.  Nontender.  No audible carotid bruit.  HEART:  Appears to be regular.   LUNGS:  No crackles or wheezing audible.  Respirations even and unlabored.   RADIAL PULSE:  Equal bilaterally.  ABDOMEN:  Soft.  Nontender.  Bowel sounds present and normal.  No audible abdominal bruit.    EXTREMITIES:  No increased edema present.  DP pulses palpable and equal bilaterally.         Assessment & Plan:  INCREASED STRESS/ANXIETY.  Appears to be stable.      HEALTH MAINTENANCE.  Physical 08/10/12.  Prostate and PSAs followed by Dr Lonna Cobb.  Colonoscopy 08/11/08 revealed diverticulosis and internal hemorrhoids.    I spent 25 minutes with the patient and more than 50% of the time was spent in consultation regarding the above.

## 2012-12-19 NOTE — Assessment & Plan Note (Signed)
Blood pressure stable ? ?

## 2012-12-19 NOTE — Assessment & Plan Note (Signed)
History of CAD s/p CABG.  Sees cardiology regularly - Dr Lady Gary.  Negative functional study 01/18/10.  Just saw cardiology.  Felt things were stable.

## 2012-12-19 NOTE — Assessment & Plan Note (Signed)
Doing better on levaquin.  Only has two more days abx.  Complete.  Eating better.  Recheck cbc to confirm white blood cell count normal.

## 2012-12-19 NOTE — Assessment & Plan Note (Signed)
Rate controlled on current meds.  On coumadin.  Dr Fath following.    

## 2012-12-19 NOTE — Assessment & Plan Note (Signed)
Hgb decreased.  Found to be iron deficient.  Start ferrous sulfate 325mg  q day.  Follow hgb and iron stores.  Refer to GI for further evaluation.

## 2013-01-28 ENCOUNTER — Other Ambulatory Visit: Payer: Self-pay | Admitting: *Deleted

## 2013-01-28 ENCOUNTER — Telehealth: Payer: Self-pay | Admitting: Internal Medicine

## 2013-01-28 ENCOUNTER — Encounter: Payer: Self-pay | Admitting: Internal Medicine

## 2013-01-28 MED ORDER — OSELTAMIVIR PHOSPHATE 75 MG PO CAPS: 75.0000 mg | ORAL_CAPSULE | Freq: Every day | ORAL | Status: DC

## 2013-01-28 NOTE — Telephone Encounter (Signed)
It was his great grandson & he was in the same house & coughing all over the place. Tyler Stout has no sx's at this time. He was treated a little while back for Pneumonia.

## 2013-01-28 NOTE — Telephone Encounter (Signed)
Need specifics.  Who was he exposed to.  Is this someone he had close contact with?

## 2013-01-28 NOTE — Telephone Encounter (Signed)
The patient's wife called concerned about her husband who has been around a child that has been diagnosed with the flu . The patient does not have any symptoms . Should he take Tamiflu to keep from getting the flu . Please advise.

## 2013-01-28 NOTE — Telephone Encounter (Signed)
Wife notified & Rx sent in to pharmacy

## 2013-01-28 NOTE — Telephone Encounter (Signed)
Please advise 

## 2013-01-28 NOTE — Telephone Encounter (Signed)
Since household exposure and since recently in hospital, I am going to go ahead and prophylax him with tamiflu.  Take one tamiflu once a day for 10 days.  If he has any problems or worsening symptoms, needs to let us know.  Can call in tamiflu 75mg  one q day for 10 days.

## 2013-02-09 ENCOUNTER — Telehealth: Payer: Self-pay | Admitting: Internal Medicine

## 2013-02-09 NOTE — Telephone Encounter (Signed)
Patient Information:  Caller Name: Festus Holts  Phone: [phone]  Patient: Tyler Stout, Tyler Stout  Gender: Male  DOB: 1925/02/18  Age: 80 Years  PCP: [name]  Office Follow Up:  Does the office need to follow up with this patient?: No  Instructions For The Office: N/A  RN Note:  Drinking lots of water and Gatorade.  Advised to see UC now; unwilling to drive to [facility] to be seen at [facility]  Symptoms  Reason For Call & Symptoms: Moderate diarrhea with 8-10 stools in last 16 hours.  Reviewed Health History In EMR: Yes  Reviewed Medications In EMR: Yes  Reviewed Allergies In EMR: Yes  Reviewed Surgeries / Procedures: Yes  Date of Onset of Symptoms: 02/08/2013  Treatments Tried: Pepto Bismol, Gatorade  Treatments Tried Worked: No  Guideline(s) Used:  Diarrhea  Disposition Per Guideline:   Go to Office Now  Reason For Disposition Reached:   Age > 60 years and has had > 6 diarrhea stools in past 24 hours  Advice Given:  Reassurance:  In healthy adults, new-onset diarrhea is usually caused by a viral infection of the intestines, which you can treat at home. Diarrhea is the body's way of getting rid of the infection. Here are some tips on how to keep ahead of the fluid losses.  Here is some care advice that should help.  Fluids:  Drink more fluids, at least 8-10 glasses (8 oz or 240 ml) daily.  For example: sports drinks, diluted fruit juices, soft drinks.  Supplement this with saltine crackers or soups to make certain that you are getting sufficient fluid and salt to meet your body's needs.  Avoid caffeinated beverages (Reason: caffeine is mildly dehydrating).  Nutrition:  Maintaining some food intake during episodes of diarrhea is important.  Ideal initial foods include boiled starches/cereals (e.g., potatoes, rice, noodles, wheat, oats) with a small amount of salt to taste.  Other acceptable foods include: bananas, yogurt, crackers, soup.  As your stools return to  normal consistency, resume a normal diet.  Call Back If:  Signs of dehydration occur (e.g., no urine for more than 12 hours, very dry mouth, lightheaded, etc.)  Diarrhea lasts over 7 days  You become worse.  RN Overrode Recommendation:  Go To [facility]  No appointments remain in office for 02/09/13

## 2013-02-09 NOTE — Telephone Encounter (Signed)
FYI

## 2013-02-09 NOTE — Telephone Encounter (Signed)
Left message to find if if patient went to UC, if so which one?

## 2013-02-09 NOTE — Telephone Encounter (Signed)
Agree with evaluation.  May need fluids, etc.

## 2013-02-15 ENCOUNTER — Ambulatory Visit (INDEPENDENT_AMBULATORY_CARE_PROVIDER_SITE_OTHER): Payer: Medicare Other | Admitting: Internal Medicine

## 2013-02-15 ENCOUNTER — Other Ambulatory Visit: Payer: Self-pay | Admitting: Internal Medicine

## 2013-02-15 ENCOUNTER — Encounter: Payer: Self-pay | Admitting: Internal Medicine

## 2013-02-15 VITALS — BP 110/70 | HR 61 | Temp 97.7°F | Ht 65.0 in | Wt 153.0 lb

## 2013-02-15 DIAGNOSIS — R109 Unspecified abdominal pain: Secondary | ICD-10-CM

## 2013-02-15 DIAGNOSIS — E871 Hypo-osmolality and hyponatremia: Secondary | ICD-10-CM

## 2013-02-15 DIAGNOSIS — I2510 Atherosclerotic heart disease of native coronary artery without angina pectoris: Secondary | ICD-10-CM

## 2013-02-15 DIAGNOSIS — E876 Hypokalemia: Secondary | ICD-10-CM

## 2013-02-15 DIAGNOSIS — I10 Essential (primary) hypertension: Secondary | ICD-10-CM

## 2013-02-15 DIAGNOSIS — E878 Other disorders of electrolyte and fluid balance, not elsewhere classified: Secondary | ICD-10-CM

## 2013-02-15 DIAGNOSIS — R197 Diarrhea, unspecified: Secondary | ICD-10-CM

## 2013-02-15 LAB — BASIC METABOLIC PANEL
BUN: 21 mg/dL (ref 6–23)
CO2: 26 mEq/L (ref 19–32)
Calcium: 8.4 mg/dL (ref 8.4–10.5)
Chloride: 98 mEq/L (ref 96–112)
Creatinine, Ser: 0.9 mg/dL (ref 0.4–1.5)
GFR: 86.22 mL/min (ref >60.00)
Glucose, Bld: 115 mg/dL — ABNORMAL HIGH (ref 70–99)
POTASSIUM: 3.7 meq/L (ref 3.5–5.1)
Sodium: 131 mEq/L — ABNORMAL LOW (ref 135–145)

## 2013-02-15 LAB — HEPATIC FUNCTION PANEL
ALT: 16 U/L (ref 0–53)
AST: 24 U/L (ref 0–37)
Albumin: 3.2 g/dL — ABNORMAL LOW (ref 3.5–5.2)
Alkaline Phosphatase: 78 U/L (ref 39–117)
BILIRUBIN DIRECT: 0.1 mg/dL (ref 0.0–0.3)
Total Bilirubin: 0.6 mg/dL (ref 0.3–1.2)
Total Protein: 6 g/dL (ref 6.0–8.3)

## 2013-02-15 NOTE — Telephone Encounter (Signed)
Pt was seen in office today

## 2013-02-15 NOTE — Progress Notes (Signed)
Order placed for f/u sodium.  ?

## 2013-02-16 ENCOUNTER — Encounter: Payer: Self-pay | Admitting: *Deleted

## 2013-02-16 ENCOUNTER — Telehealth: Payer: Self-pay | Admitting: Internal Medicine

## 2013-02-16 NOTE — Telephone Encounter (Signed)
Please put him in on 03/11/13 at 11:45 instead.  Thanks.

## 2013-02-16 NOTE — Telephone Encounter (Signed)
Pt was in yesterday and you wanted him to come back in 4 weeks 30 min appointment.  I made him an appointment for 04/07/13 @ 3:15 blocked 4:15 to allow extra time. Pt  Wanted to make sure it was ok with you for him to wait 7 weeks instead of 4

## 2013-02-16 NOTE — Telephone Encounter (Signed)
Pt aware of appointment change to 2/27

## 2013-02-20 ENCOUNTER — Encounter: Payer: Self-pay | Admitting: Internal Medicine

## 2013-02-20 DIAGNOSIS — E876 Hypokalemia: Secondary | ICD-10-CM | POA: Insufficient documentation

## 2013-02-20 DIAGNOSIS — R197 Diarrhea, unspecified: Secondary | ICD-10-CM | POA: Insufficient documentation

## 2013-02-20 NOTE — Assessment & Plan Note (Signed)
Diarrhea has resolved.  On potassium supplements.  Recheck potassium today.

## 2013-02-20 NOTE — Assessment & Plan Note (Signed)
Resolved.  Eating better.  Still some bloating and increased gas.  Better.  Continue a probiotic.  Follow.  Check metabolic panel.

## 2013-02-20 NOTE — Assessment & Plan Note (Signed)
Blood pressure stable ? ?

## 2013-02-20 NOTE — Assessment & Plan Note (Signed)
History of CAD s/p CABG.  Sees cardiology regularly - Dr Ubaldo Glassing.  Negative functional study 01/18/10.  Recently saw cardiology.  Felt things were stable.

## 2013-02-20 NOTE — Progress Notes (Signed)
Subjective:    [name]: Tyler Stout, male    DOB: [birth year], 80 y.o.   MRN: [id number]  HPI 80 year old male with past history of atherosclerotic heart disease s/p bypass graft x 4, hypercholesterolemia, reflux disease, hypertension and gallstone pancreatitis.  He comes in today for a scheduled follow up.  Was evaluated 02/09/13 - acute care.  Had diarrhea.  See Dr Ammie Ferrier note for details.  Labs revealed decreased potassium and sodium.  Since that visit, his diarrhea has stopped.  Had a normal bowel movement this am.  Normal bowel movement yesterday.  Feels better.  Some increased bloating and gas.  Eating better.  Taking potassium supplements.  Still some fatigue, but better.  Breathing stable.  No chest pain or tightness.       Past Medical History  Diagnosis Date  . CAD (coronary artery disease)     s/p bypass graft x 4 (1991)  . GERD (gastroesophageal reflux disease)   . Atrial fibrillation   . Anemia     iron deficiency  . Hypertension   . Hypercholesterolemia   . Pancreatitis     x2 (1994 and 1998), presumed to be gallstone pancreatitis.  s/p ERCP and sphincterotomy  . Bilateral renal cysts     worked up by Dr Bernardo Heater  . Glaucoma   . BPH (benign prostatic hypertrophy)   . Diverticulosis   . Arthritis     Current Outpatient Prescriptions on File Prior to Visit  Medication Sig Dispense Refill  . atorvastatin (LIPITOR) 20 MG tablet Take 1 tablet (20 mg total) by mouth daily.  90 tablet  3  . Calcium Carbonate-Vitamin D (CALCIUM 600+D) 600-400 MG-UNIT per tablet Take 1 tablet by mouth daily.      . Diltiazem HCl Coated Beads (CARDIZEM CD PO) Take 120 mg by mouth daily. As directed      . fish oil-omega-3 fatty acids 1000 MG capsule Take 1 g by mouth daily.      . hydrochlorothiazide (MICROZIDE) 12.5 MG capsule Take 1 capsule (12.5 mg total) by mouth daily.  30 capsule  2  . irbesartan-hydrochlorothiazide (AVALIDE) 300-12.5 MG per tablet Take 1 tablet by mouth daily.  90  tablet  3  . latanoprost (XALATAN) 0.005 % ophthalmic solution Place 1 drop into both eyes daily. 1 drop in each eye      . metoprolol tartrate (LOPRESSOR) 25 MG tablet Take 25 mg by mouth 2 (two) times daily.      . Multiple Vitamin (MULTI-VITAMIN DAILY PO) Take by mouth.      Marland Kitchen omeprazole (PRILOSEC) 20 MG capsule Take 20 mg by mouth daily.      Marland Kitchen oseltamivir (TAMIFLU) 75 MG capsule Take 1 capsule (75 mg total) by mouth daily.  10 capsule  0  . sertraline (ZOLOFT) 25 MG tablet Take 1 tablet (25 mg total) by mouth daily.  30 tablet  2  . Tamsulosin HCl (FLOMAX PO) Take by mouth as needed.       . vitamin C (ASCORBIC ACID) 500 MG tablet Take 500 mg by mouth daily.      Marland Kitchen warfarin (COUMADIN) 5 MG tablet Take 5 mg by mouth at bedtime. As directed       No current facility-administered medications on file prior to visit.    Review of Systems Patient denies any headache.  No light headedness reported.   Cough and congestion better.  No palpitations.  No chest pain or tightness.   Breathing stable.  No nausea or vomiting.  No abdominal pain or cramping.  No bowel change, such as diarrhea, constipation, BRBPR or melana.  No urine change.  Taking abx.  Tolerating better since taking with food.  Still some fatigue, but better.       Objective:   Physical Exam  Filed Vitals:   02/15/13 1014  BP: 110/70  Pulse: 61  Temp: 97.7 F (57.32 C)   80 year old male in no acute distress.  HEENT:  Nares - clear.  Oropharynx - without lesions. NECK:  Supple.  Nontender.  No audible carotid bruit.  HEART:  Appears to be regular.   LUNGS:  No crackles or wheezing audible.  Respirations even and unlabored.   RADIAL PULSE:  Equal bilaterally.  ABDOMEN:  Soft.  Nontender.  Bowel sounds present and normal.  No audible abdominal bruit.    EXTREMITIES:  No increased edema present.  DP pulses palpable and equal bilaterally.         Assessment & Plan:  INCREASED STRESS/ANXIETY.  Appears to be stable.      HEALTH MAINTENANCE.  Physical 08/10/12.  Prostate and PSAs followed by Dr Bernardo Heater.  Colonoscopy 08/11/08 revealed diverticulosis and internal hemorrhoids.    I spent 25 minutes with the patient and more than 50% of the time was spent in consultation regarding the above.

## 2013-02-20 NOTE — Assessment & Plan Note (Signed)
Decreased more with recent diarrhea.  Recheck today to confirm improved.

## 2013-02-28 ENCOUNTER — Other Ambulatory Visit (INDEPENDENT_AMBULATORY_CARE_PROVIDER_SITE_OTHER): Payer: Medicare Other

## 2013-02-28 DIAGNOSIS — E871 Hypo-osmolality and hyponatremia: Secondary | ICD-10-CM

## 2013-02-28 DIAGNOSIS — D649 Anemia, unspecified: Secondary | ICD-10-CM

## 2013-02-28 LAB — CBC WITH DIFFERENTIAL/PLATELET
BASOS PCT: 0.5 % (ref 0.0–3.0)
Basophils Absolute: 0 10*3/uL (ref 0.0–0.1)
EOS PCT: 1.5 % (ref 0.0–5.0)
Eosinophils Absolute: 0.1 10*3/uL (ref 0.0–0.7)
HEMATOCRIT: 35.1 % — AB (ref 39.0–52.0)
Hemoglobin: 11.2 g/dL — ABNORMAL LOW (ref 13.0–17.0)
LYMPHS ABS: 1.1 10*3/uL (ref 0.7–4.0)
Lymphocytes Relative: 15.8 % (ref 12.0–46.0)
MCHC: 31.9 g/dL (ref 30.0–36.0)
MCV: 91.3 fl (ref 78.0–100.0)
MONO ABS: 0.7 10*3/uL (ref 0.1–1.0)
Monocytes Relative: 10.4 % (ref 3.0–12.0)
Neutro Abs: 4.9 10*3/uL (ref 1.4–7.7)
Neutrophils Relative %: 71.8 % (ref 43.0–77.0)
Platelets: 384 10*3/uL (ref 150.0–400.0)
RBC: 3.84 Mil/uL — AB (ref 4.22–5.81)
RDW: 14.9 % — ABNORMAL HIGH (ref 11.5–14.6)
WBC: 6.9 10*3/uL (ref 4.5–10.5)

## 2013-02-28 LAB — IBC PANEL
Iron: 35 ug/dL — ABNORMAL LOW (ref 42–165)
Saturation Ratios: 11 % — ABNORMAL LOW (ref 20.0–50.0)
TRANSFERRIN: 227.8 mg/dL (ref 212.0–360.0)

## 2013-02-28 LAB — SODIUM: Sodium: 133 mEq/L — ABNORMAL LOW (ref 135–145)

## 2013-03-02 LAB — FERRITIN: Ferritin: 58.9 ng/mL (ref 22.0–322.0)

## 2013-03-04 ENCOUNTER — Ambulatory Visit: Payer: Self-pay | Admitting: Internal Medicine

## 2013-03-04 NOTE — Telephone Encounter (Signed)
Patient Information:  Caller Name: Olivia Mackie  Phone: [phone]  Patient: Tyler Stout, Tyler Stout  Gender: Male  DOB: Dec 31, 1925  Age: 80 Years  PCP: [name]  Office Follow Up:  Does the office need to follow up with this patient?: No  Instructions For The Office: N/A  RN Note:  Afebrile. Onset 03/04/2013 @ 2:00am back pain, got some relief with massager and heat and 650 mg of Tylenol (did not give relief). Moderate Back pain, presently, in the middle of the night it was severe. He can sit down in chair now and pain ok as long as he "does not move". Tracey agreed to appointment today, 03/04/2013, with Dr. Derrel Nip @ 11:00, [place] office.   Symptoms  Reason For Call & Symptoms: 03/04/2013 had severe back spasm and could not walk.  Reviewed Health History In EMR: Yes  Reviewed Medications In EMR: Yes  Reviewed Allergies In EMR: Yes  Reviewed Surgeries / Procedures: Yes  Date of Onset of Symptoms: 03/04/2013  Treatments Tried: heating pad and 650 Tylenol @ 2:00 am  Treatments Tried Worked: Yes  Guideline(s) Used:  Back Pain  Disposition Per Guideline:   Go to Office Now  Reason For Disposition Reached:   Severe back pain  Advice Given:  Reassurance:  Twisting or heavy lifting can cause back pain.  With treatment, the pain most often goes away in 1-2 weeks.  You can treat most back pain at home.  Here is some care advice that should help.  Sleep:  Sleep on your side with a pillow between your knees. If you sleep on your back, put a pillow under your knees.  Avoid sleeping on your stomach.  Your mattress should be firm. Avoid waterbeds.  Cold or Heat:  Cold Pack: For pain or swelling, use a cold pack or ice wrapped in a wet cloth. Put it on the sore area for 20 minutes. Repeat 4 times on the first day, then as needed.  Activity  Keep doing your day-to-day activities if it is not too painful. Staying active is better than resting.  Avoid anything that makes your pain worse.  Avoid heavy lifting, twisting, and too much exercise until your back heals.  You do not need to stay in bed.  Pain Medicines:  For pain relief, take acetaminophen, ibuprofen, or naproxen.  Patient Will Follow Care Advice:  YES  Appointment Scheduled:  03/04/2013 11:00:00 Appointment Scheduled Provider:  Deborra Medina (Adults only)

## 2013-03-04 NOTE — Telephone Encounter (Signed)
Noted  

## 2013-03-04 NOTE — Telephone Encounter (Signed)
FYI: pt called back & cancel appt with Dr. Derrel Nip. He decided to go to [facility] today

## 2013-03-09 ENCOUNTER — Encounter: Payer: Self-pay | Admitting: Internal Medicine

## 2013-03-09 DIAGNOSIS — D649 Anemia, unspecified: Secondary | ICD-10-CM

## 2013-03-11 ENCOUNTER — Encounter: Payer: Self-pay | Admitting: Internal Medicine

## 2013-03-11 ENCOUNTER — Ambulatory Visit (INDEPENDENT_AMBULATORY_CARE_PROVIDER_SITE_OTHER): Payer: Medicare Other | Admitting: Internal Medicine

## 2013-03-11 VITALS — BP 120/80 | HR 56 | Temp 97.9°F | Ht 65.0 in | Wt 158.5 lb

## 2013-03-11 DIAGNOSIS — I4891 Unspecified atrial fibrillation: Secondary | ICD-10-CM

## 2013-03-11 DIAGNOSIS — E871 Hypo-osmolality and hyponatremia: Secondary | ICD-10-CM

## 2013-03-11 DIAGNOSIS — R197 Diarrhea, unspecified: Secondary | ICD-10-CM

## 2013-03-11 DIAGNOSIS — M549 Dorsalgia, unspecified: Secondary | ICD-10-CM

## 2013-03-11 DIAGNOSIS — I2510 Atherosclerotic heart disease of native coronary artery without angina pectoris: Secondary | ICD-10-CM

## 2013-03-11 DIAGNOSIS — E7800 Pure hypercholesterolemia, unspecified: Secondary | ICD-10-CM

## 2013-03-11 DIAGNOSIS — I10 Essential (primary) hypertension: Secondary | ICD-10-CM

## 2013-03-11 DIAGNOSIS — D649 Anemia, unspecified: Secondary | ICD-10-CM

## 2013-03-11 DIAGNOSIS — D509 Iron deficiency anemia, unspecified: Secondary | ICD-10-CM

## 2013-03-13 ENCOUNTER — Encounter: Payer: Self-pay | Admitting: Internal Medicine

## 2013-03-13 DIAGNOSIS — M549 Dorsalgia, unspecified: Secondary | ICD-10-CM | POA: Insufficient documentation

## 2013-03-13 NOTE — Assessment & Plan Note (Signed)
Rate controlled on current meds.  On coumadin.  Dr Fath following.    

## 2013-03-13 NOTE — Progress Notes (Signed)
Subjective:    [name]: Tyler Stout, male    DOB: [birth year], 80 y.o.   MRN: [id number]  HPI 80 year old male with past history of atherosclerotic heart disease s/p bypass graft x 4, hypercholesterolemia, reflux disease, hypertension and gallstone pancreatitis.  He comes in today for a scheduled follow up.  Was evaluated 02/09/13 - acute care.  Had diarrhea.  See Dr Ammie Ferrier note for details.  Has had intermittent issues with his bowels and increased gas.  He has monitored dairy intake.  Now states he is having more regular bowel movements.  States the gas is better.  No abdominal pain or cramping.  Eating better.  Breathing stable.  No chest pain or tightness.  Does report that recently he reached down to pick up a flashlight and twisted his back.  Increased pain in his lower back since.  Was evaluated at the orthopedic office.  Xray with arthritis.  Was given valium and hydrocodone.  Pain is better.  Still present but better.  Takes pain med in the am and at night (before bed).  No radiation of the pain.      Past Medical History  Diagnosis Date  . CAD (coronary artery disease)     s/p bypass graft x 4 (1991)  . GERD (gastroesophageal reflux disease)   . Atrial fibrillation   . Anemia     iron deficiency  . Hypertension   . Hypercholesterolemia   . Pancreatitis     x2 (1994 and 1998), presumed to be gallstone pancreatitis.  s/p ERCP and sphincterotomy  . Bilateral renal cysts     worked up by Dr Bernardo Heater  . Glaucoma   . BPH (benign prostatic hypertrophy)   . Diverticulosis   . Arthritis     Current Outpatient Prescriptions on File Prior to Visit  Medication Sig Dispense Refill  . atorvastatin (LIPITOR) 20 MG tablet Take 1 tablet (20 mg total) by mouth daily.  90 tablet  3  . Calcium Carbonate-Vitamin D (CALCIUM 600+D) 600-400 MG-UNIT per tablet Take 1 tablet by mouth daily.      . Diltiazem HCl Coated Beads (CARDIZEM CD PO) Take 120 mg by mouth daily. As directed      . fish  oil-omega-3 fatty acids 1000 MG capsule Take 1 g by mouth daily.      . hydrochlorothiazide (MICROZIDE) 12.5 MG capsule Take 1 capsule (12.5 mg total) by mouth daily.  30 capsule  2  . irbesartan-hydrochlorothiazide (AVALIDE) 300-12.5 MG per tablet Take 1 tablet by mouth daily.  90 tablet  3  . latanoprost (XALATAN) 0.005 % ophthalmic solution Place 1 drop into both eyes daily. 1 drop in each eye      . metoprolol tartrate (LOPRESSOR) 25 MG tablet Take 25 mg by mouth 2 (two) times daily.      . Multiple Vitamin (MULTI-VITAMIN DAILY PO) Take by mouth.      Marland Kitchen omeprazole (PRILOSEC) 20 MG capsule Take 20 mg by mouth daily.      . sertraline (ZOLOFT) 25 MG tablet Take 1 tablet (25 mg total) by mouth daily.  30 tablet  2  . Tamsulosin HCl (FLOMAX PO) Take by mouth as needed.       . vitamin C (ASCORBIC ACID) 500 MG tablet Take 500 mg by mouth daily.      Marland Kitchen warfarin (COUMADIN) 5 MG tablet Take 5 mg by mouth at bedtime. As directed       No current facility-administered medications on  file prior to visit.    Review of Systems Patient denies any headache.  No light headedness reported.   No cough and congestion.  No palpitations.  No chest pain or tightness.   Breathing stable.   No nausea or vomiting.  No abdominal pain or cramping.  No bowel change, such as diarrhea, BRBPR or melana.  No urine change.  Back pain as outlined.  Decreased gas.  Taking the hydrocodone as directed.  Recent labs with decreased hgb.        Objective:   Physical Exam  Filed Vitals:   03/11/13 1143  BP: 120/80  Pulse: 56  Temp: 97.9 F (36.6 C)   Blood pressure recheck:  130/62, pulse 26  80 year old male in no acute distress.  HEENT:  Nares - clear.  Oropharynx - without lesions. NECK:  Supple.  Nontender.  No audible carotid bruit.  HEART:  Appears to be regular.   LUNGS:  No crackles or wheezing audible.  Respirations even and unlabored.   RADIAL PULSE:  Equal bilaterally.  ABDOMEN:  Soft.  Nontender.  Bowel  sounds present and normal.  No audible abdominal bruit.    EXTREMITIES:  No increased edema present.  DP pulses palpable and equal bilaterally.         Assessment & Plan:  INCREASED STRESS/ANXIETY.  Appears to be stable.  On zoloft.   HEALTH MAINTENANCE.  Physical 08/10/12.  Prostate and PSAs followed by Dr Bernardo Heater.  Colonoscopy 08/11/08 revealed diverticulosis and internal hemorrhoids.    I spent 25 minutes with the patient and more than 50% of the time was spent in consultation regarding the above.

## 2013-03-13 NOTE — Assessment & Plan Note (Signed)
Sodium just checked and stable at 133.  Follow.

## 2013-03-13 NOTE — Assessment & Plan Note (Signed)
Hgb decreased.  Found to be iron deficient.  Started ferrous sulfate 325mg  q day.  Refer to GI for further evaluation.

## 2013-03-13 NOTE — Assessment & Plan Note (Signed)
Acute flare recently.  Saw ortho.  Xray with arthritis.  Taking hydrocodone.  Better.  Follow.

## 2013-03-13 NOTE — Assessment & Plan Note (Signed)
Resolved

## 2013-03-13 NOTE — Assessment & Plan Note (Signed)
On Vytorin.  Low cholesterol diet and exercise.  Follow.   

## 2013-03-13 NOTE — Assessment & Plan Note (Signed)
Blood pressure stable ? ?

## 2013-03-13 NOTE — Assessment & Plan Note (Signed)
History of CAD s/p CABG.  Sees cardiology regularly - Dr Fath.  Negative functional study 01/18/10.  Recently saw cardiology.  Felt things were stable.      

## 2013-03-15 ENCOUNTER — Other Ambulatory Visit (INDEPENDENT_AMBULATORY_CARE_PROVIDER_SITE_OTHER): Payer: Medicare Other

## 2013-03-15 DIAGNOSIS — D509 Iron deficiency anemia, unspecified: Secondary | ICD-10-CM

## 2013-03-15 LAB — FECAL OCCULT BLOOD, IMMUNOCHEMICAL: FECAL OCCULT BLD: POSITIVE — AB

## 2013-04-07 ENCOUNTER — Ambulatory Visit: Payer: Medicare Other | Admitting: Internal Medicine

## 2013-04-30 ENCOUNTER — Other Ambulatory Visit: Payer: Self-pay | Admitting: Internal Medicine

## 2013-05-09 ENCOUNTER — Ambulatory Visit (INDEPENDENT_AMBULATORY_CARE_PROVIDER_SITE_OTHER): Payer: Medicare Other | Admitting: Internal Medicine

## 2013-05-09 ENCOUNTER — Encounter: Payer: Self-pay | Admitting: Internal Medicine

## 2013-05-09 VITALS — BP 120/60 | HR 59 | Temp 97.8°F | Ht 65.0 in | Wt 152.5 lb

## 2013-05-09 DIAGNOSIS — E871 Hypo-osmolality and hyponatremia: Secondary | ICD-10-CM

## 2013-05-09 DIAGNOSIS — D649 Anemia, unspecified: Secondary | ICD-10-CM

## 2013-05-09 DIAGNOSIS — E7800 Pure hypercholesterolemia, unspecified: Secondary | ICD-10-CM

## 2013-05-09 DIAGNOSIS — I4891 Unspecified atrial fibrillation: Secondary | ICD-10-CM

## 2013-05-09 DIAGNOSIS — I2510 Atherosclerotic heart disease of native coronary artery without angina pectoris: Secondary | ICD-10-CM

## 2013-05-09 DIAGNOSIS — I10 Essential (primary) hypertension: Secondary | ICD-10-CM

## 2013-05-09 DIAGNOSIS — R110 Nausea: Secondary | ICD-10-CM

## 2013-05-09 NOTE — Progress Notes (Signed)
Subjective:    [name]: Tyler Stout, male    DOB: [birth year], 80 y.o.   MRN: [id number]  HPI 80 year old male with past history of atherosclerotic heart disease s/p bypass graft x 4, hypercholesterolemia, reflux disease, hypertension and gallstone pancreatitis.  He comes in today for a scheduled follow up.  Was evaluated 02/09/13 - acute care.  Had diarrhea.  See Dr Ammie Ferrier note for details.  Has had intermittent issues with his bowels and increased gas.  He has monitored dairy intake.  Now states he is having more regular bowel movements.  States the gas is better.  Still present.  No abdominal pain or cramping.  Some nausea in the am.  No acid reflux.   Eating better.  Breathing stable.  No chest pain or tightness.  Breathing stable.  Saw GI.  Planning for EGD and colonoscopy this week.      Past Medical History  Diagnosis Date  . CAD (coronary artery disease)     s/p bypass graft x 4 (1991)  . GERD (gastroesophageal reflux disease)   . Atrial fibrillation   . Anemia     iron deficiency  . Hypertension   . Hypercholesterolemia   . Pancreatitis     x2 (1994 and 1998), presumed to be gallstone pancreatitis.  s/p ERCP and sphincterotomy  . Bilateral renal cysts     worked up by Dr Bernardo Heater  . Glaucoma   . BPH (benign prostatic hypertrophy)   . Diverticulosis   . Arthritis     Current Outpatient Prescriptions on File Prior to Visit  Medication Sig Dispense Refill  . atorvastatin (LIPITOR) 20 MG tablet Take 1 tablet (20 mg total) by mouth daily.  90 tablet  3  . Calcium Carbonate-Vitamin D (CALCIUM 600+D) 600-400 MG-UNIT per tablet Take 1 tablet by mouth daily.      . Diltiazem HCl Coated Beads (CARDIZEM CD PO) Take 120 mg by mouth daily. As directed      . fish oil-omega-3 fatty acids 1000 MG capsule Take 1 g by mouth daily.      . hydrochlorothiazide (MICROZIDE) 12.5 MG capsule Take 1 capsule (12.5 mg total) by mouth daily.  30 capsule  2  . HYDROcodone-acetaminophen (NORCO)  10-325 MG per tablet Take 1 tablet by mouth every 6 (six) hours as needed for moderate pain.      Marland Kitchen irbesartan-hydrochlorothiazide (AVALIDE) 300-12.5 MG per tablet TAKE 1 TABLET BY MOUTH DAILY.  90 tablet  1  . latanoprost (XALATAN) 0.005 % ophthalmic solution Place 1 drop into both eyes daily. 1 drop in each eye      . metoprolol tartrate (LOPRESSOR) 25 MG tablet Take 25 mg by mouth 2 (two) times daily.      . Multiple Vitamin (MULTI-VITAMIN DAILY PO) Take by mouth.      Marland Kitchen omeprazole (PRILOSEC) 20 MG capsule Take 20 mg by mouth daily.      . sertraline (ZOLOFT) 25 MG tablet Take 1 tablet (25 mg total) by mouth daily.  30 tablet  2  . Tamsulosin HCl (FLOMAX PO) Take by mouth as needed.       . vitamin C (ASCORBIC ACID) 500 MG tablet Take 500 mg by mouth daily.      Marland Kitchen warfarin (COUMADIN) 5 MG tablet Take 5 mg by mouth at bedtime. As directed       No current facility-administered medications on file prior to visit.    Review of Systems Patient denies any headache.  No light headedness reported.   No cough and congestion.  No palpitations.  No chest pain or tightness.   Breathing stable.   No vomiting.  AM nausea.  No abdominal pain or cramping.  No bowel change, such as diarrhea, BRBPR or melana.  No urine change.      Objective:   Physical Exam  Filed Vitals:   05/09/13 0956  BP: 120/60  Pulse: 59  Temp: 97.8 F (36.6 C)   Blood pressure recheck:  4/11  80 year old male in no acute distress.  HEENT:  Nares - clear.  Oropharynx - without lesions. NECK:  Supple.  Nontender.  No audible carotid bruit.  HEART:  Appears to be regular.   LUNGS:  No crackles or wheezing audible.  Respirations even and unlabored.   RADIAL PULSE:  Equal bilaterally.  ABDOMEN:  Soft.  Nontender.  Bowel sounds present and normal.  No audible abdominal bruit.    EXTREMITIES:  No increased edema present.  DP pulses palpable and equal bilaterally.         Assessment & Plan:  INCREASED STRESS/ANXIETY.   Appears to be stable.  On zoloft.   HEALTH MAINTENANCE.  Physical 08/10/12.  Prostate and PSAs followed by Dr Bernardo Heater.  Colonoscopy 08/11/08 revealed diverticulosis and internal hemorrhoids.  Planning for f/u colonoscopy this week.    I spent 25 minutes with the patient and more than 50% of the time was spent in consultation regarding the above.

## 2013-05-09 NOTE — Assessment & Plan Note (Signed)
Blood pressure stable ? ?

## 2013-05-09 NOTE — Assessment & Plan Note (Signed)
Recheck sodium with next labs.  Has been stable.

## 2013-05-09 NOTE — Progress Notes (Signed)
Pre visit review using our clinic review tool, if applicable. No additional management support is needed unless otherwise documented below in the visit note. 

## 2013-05-09 NOTE — Assessment & Plan Note (Signed)
Rate controlled on current meds.  On coumadin.  Dr Fath following.    

## 2013-05-09 NOTE — Assessment & Plan Note (Signed)
Hgb decreased.  Found to be iron deficient.  Started ferrous sulfate 325mg  q day.  Referred to GI for further evaluation.  Planning to have an EGD and colonoscopy this week.

## 2013-05-09 NOTE — Assessment & Plan Note (Signed)
AM nausea.  Seeing GI.  Planning for EGD and colonoscopy this week.  Continue prilosec.

## 2013-05-09 NOTE — Assessment & Plan Note (Signed)
On Vytorin.  Low cholesterol diet and exercise.  Follow.   

## 2013-05-09 NOTE — Assessment & Plan Note (Signed)
History of CAD s/p CABG.  Sees cardiology regularly - Dr Ubaldo Glassing.  Negative functional study 01/18/10.  Stable.

## 2013-05-11 ENCOUNTER — Ambulatory Visit: Payer: Self-pay | Admitting: Unknown Physician Specialty

## 2013-05-11 LAB — HM COLONOSCOPY

## 2013-05-12 LAB — PATHOLOGY REPORT

## 2013-05-27 ENCOUNTER — Encounter: Payer: Self-pay | Admitting: *Deleted

## 2013-05-28 ENCOUNTER — Other Ambulatory Visit: Payer: Self-pay | Admitting: Internal Medicine

## 2013-06-01 ENCOUNTER — Encounter: Payer: Self-pay | Admitting: *Deleted

## 2013-06-01 ENCOUNTER — Other Ambulatory Visit (INDEPENDENT_AMBULATORY_CARE_PROVIDER_SITE_OTHER): Payer: Medicare Other

## 2013-06-01 DIAGNOSIS — I10 Essential (primary) hypertension: Secondary | ICD-10-CM

## 2013-06-01 DIAGNOSIS — D649 Anemia, unspecified: Secondary | ICD-10-CM

## 2013-06-01 DIAGNOSIS — E7800 Pure hypercholesterolemia, unspecified: Secondary | ICD-10-CM

## 2013-06-01 DIAGNOSIS — I4891 Unspecified atrial fibrillation: Secondary | ICD-10-CM

## 2013-06-01 LAB — LIPID PANEL
CHOL/HDL RATIO: 3
Cholesterol: 142 mg/dL (ref 0–200)
HDL: 41.1 mg/dL (ref >39.00)
LDL Cholesterol: 74 mg/dL (ref 0–99)
Triglycerides: 136 mg/dL (ref 0.0–149.0)
VLDL: 27.2 mg/dL (ref 0.0–40.0)

## 2013-06-01 LAB — CBC WITH DIFFERENTIAL/PLATELET
BASOS PCT: 0.3 % (ref 0.0–3.0)
Basophils Absolute: 0 10*3/uL (ref 0.0–0.1)
Eosinophils Absolute: 0.2 10*3/uL (ref 0.0–0.7)
Eosinophils Relative: 1.7 % (ref 0.0–5.0)
HEMATOCRIT: 38.7 % — AB (ref 39.0–52.0)
Hemoglobin: 12.7 g/dL — ABNORMAL LOW (ref 13.0–17.0)
LYMPHS ABS: 1.4 10*3/uL (ref 0.7–4.0)
Lymphocytes Relative: 14.7 % (ref 12.0–46.0)
MCHC: 32.9 g/dL (ref 30.0–36.0)
MCV: 90.9 fl (ref 78.0–100.0)
MONO ABS: 1 10*3/uL (ref 0.1–1.0)
Monocytes Relative: 10 % (ref 3.0–12.0)
Neutro Abs: 7 10*3/uL (ref 1.4–7.7)
Neutrophils Relative %: 73.3 % (ref 43.0–77.0)
Platelets: 303 10*3/uL (ref 150.0–400.0)
RBC: 4.25 Mil/uL (ref 4.22–5.81)
RDW: 16.1 % — ABNORMAL HIGH (ref 11.5–15.5)
WBC: 9.6 10*3/uL (ref 4.0–10.5)

## 2013-06-01 LAB — HEPATIC FUNCTION PANEL
ALT: 17 U/L (ref 0–53)
AST: 27 U/L (ref 0–37)
Albumin: 4 g/dL (ref 3.5–5.2)
Alkaline Phosphatase: 72 U/L (ref 39–117)
BILIRUBIN DIRECT: 0.2 mg/dL (ref 0.0–0.3)
BILIRUBIN TOTAL: 0.7 mg/dL (ref 0.2–1.2)
Total Protein: 6.7 g/dL (ref 6.0–8.3)

## 2013-06-01 LAB — BASIC METABOLIC PANEL
BUN: 24 mg/dL — ABNORMAL HIGH (ref 6–23)
CALCIUM: 8.8 mg/dL (ref 8.4–10.5)
CO2: 26 mEq/L (ref 19–32)
CREATININE: 0.9 mg/dL (ref 0.4–1.5)
Chloride: 101 mEq/L (ref 96–112)
GFR: 83.98 mL/min (ref >60.00)
GLUCOSE: 95 mg/dL (ref 70–99)
Potassium: 4.2 mEq/L (ref 3.5–5.1)
Sodium: 135 mEq/L (ref 135–145)

## 2013-06-01 LAB — TSH: TSH: 1.41 u[IU]/mL (ref 0.35–4.50)

## 2013-06-13 ENCOUNTER — Encounter: Payer: Self-pay | Admitting: Internal Medicine

## 2013-08-09 ENCOUNTER — Encounter: Payer: Self-pay | Admitting: Internal Medicine

## 2013-08-09 ENCOUNTER — Ambulatory Visit (INDEPENDENT_AMBULATORY_CARE_PROVIDER_SITE_OTHER): Payer: Medicare Other | Admitting: Internal Medicine

## 2013-08-09 VITALS — BP 118/64 | HR 67 | Temp 97.8°F | Ht 65.0 in | Wt 156.5 lb

## 2013-08-09 DIAGNOSIS — R110 Nausea: Secondary | ICD-10-CM

## 2013-08-09 DIAGNOSIS — E871 Hypo-osmolality and hyponatremia: Secondary | ICD-10-CM

## 2013-08-09 DIAGNOSIS — D508 Other iron deficiency anemias: Secondary | ICD-10-CM

## 2013-08-09 DIAGNOSIS — J3489 Other specified disorders of nose and nasal sinuses: Secondary | ICD-10-CM

## 2013-08-09 DIAGNOSIS — I2510 Atherosclerotic heart disease of native coronary artery without angina pectoris: Secondary | ICD-10-CM

## 2013-08-09 DIAGNOSIS — R0981 Nasal congestion: Secondary | ICD-10-CM

## 2013-08-09 DIAGNOSIS — I4891 Unspecified atrial fibrillation: Secondary | ICD-10-CM

## 2013-08-09 DIAGNOSIS — E7800 Pure hypercholesterolemia, unspecified: Secondary | ICD-10-CM

## 2013-08-09 DIAGNOSIS — I10 Essential (primary) hypertension: Secondary | ICD-10-CM

## 2013-08-09 MED ORDER — INTEGRA 62.5-62.5-40-3 MG PO CAPS: 1.0000 | ORAL_CAPSULE | Freq: Every day | ORAL | Status: DC

## 2013-08-09 MED ORDER — TAMSULOSIN HCL 0.4 MG PO CAPS: 0.4000 mg | ORAL_CAPSULE | Freq: Every day | ORAL | Status: DC

## 2013-08-09 NOTE — Progress Notes (Signed)
Pre visit review using our clinic review tool, if applicable. No additional management support is needed unless otherwise documented below in the visit note. 

## 2013-08-13 ENCOUNTER — Encounter: Payer: Self-pay | Admitting: Internal Medicine

## 2013-08-13 DIAGNOSIS — R0981 Nasal congestion: Secondary | ICD-10-CM | POA: Insufficient documentation

## 2013-08-13 NOTE — Assessment & Plan Note (Addendum)
Blood pressure doing well only on metopr0lol.  He stopped the avalide.

## 2013-08-13 NOTE — Progress Notes (Signed)
Subjective:    [name]: Tyler Stout, male    DOB: 25-Aug-1925, 80 y.o.   MRN: [id number]  HPI 80 year old male with past history of atherosclerotic heart disease s/p bypass graft x 4, hypercholesterolemia, reflux disease, hypertension and gallstone pancreatitis.  He comes in today for a scheduled follow up.  Was evaluated 02/09/13 - acute care.  Had diarrhea.  See Dr Ammie Ferrier note for details.  Has had intermittent issues with his bowels and increased gas.  He has monitored dairy intake.  Now states he is having more regular bowel movements.  States the gas is better.  Still present.  No abdominal pain or cramping.  Recently found to have iron deficient anemia.  Saw GI.  Had EGD and colonoscopy.  See report for details.   No acid reflux.   Eating better.  Breathing stable.  No chest pain or tightness.  On iron.  Needs different iron.  States current iron is making him a little queezy.   Some increased congestion and pressure in his ear.  No increased cough or congestion. No headache  He stopped avalide because blood pressure low.  Has been off x 3 weeks.  Blood pressure has done well only on metoprolol.  Due to follow up with Dr Ubaldo Glassing next week.      Past Medical History  Diagnosis Date  . CAD (coronary artery disease)     s/p bypass graft x 4 (1991)  . GERD (gastroesophageal reflux disease)   . Atrial fibrillation   . Anemia     iron deficiency  . Hypertension   . Hypercholesterolemia   . Pancreatitis     x2 (1994 and 1998), presumed to be gallstone pancreatitis.  s/p ERCP and sphincterotomy  . Bilateral renal cysts     worked up by Dr Bernardo Heater  . Glaucoma   . BPH (benign prostatic hypertrophy)   . Diverticulosis   . Arthritis     Current Outpatient Prescriptions on File Prior to Visit  Medication Sig Dispense Refill  . atorvastatin (LIPITOR) 20 MG tablet TAKE 1 TABLET (20 MG TOTAL) BY MOUTH DAILY.  90 tablet  3  . Calcium Carbonate-Vitamin D (CALCIUM 600+D) 600-400 MG-UNIT per  tablet Take 1 tablet by mouth daily.      . Diltiazem HCl Coated Beads (CARDIZEM CD PO) Take 120 mg by mouth daily. As directed      . fish oil-omega-3 fatty acids 1000 MG capsule Take 1 g by mouth daily.      . hydrochlorothiazide (MICROZIDE) 12.5 MG capsule Take 1 capsule (12.5 mg total) by mouth daily.  30 capsule  2  . HYDROcodone-acetaminophen (NORCO) 10-325 MG per tablet Take 1 tablet by mouth every 6 (six) hours as needed for moderate pain.      Marland Kitchen latanoprost (XALATAN) 0.005 % ophthalmic solution Place 1 drop into both eyes daily. 1 drop in each eye      . metoprolol tartrate (LOPRESSOR) 25 MG tablet Take 25 mg by mouth 2 (two) times daily.      . Multiple Vitamin (MULTI-VITAMIN DAILY PO) Take by mouth.      Marland Kitchen omeprazole (PRILOSEC) 20 MG capsule Take 20 mg by mouth daily.      . sertraline (ZOLOFT) 25 MG tablet Take 1 tablet (25 mg total) by mouth daily.  30 tablet  2  . Tamsulosin HCl (FLOMAX PO) Take by mouth as needed.       . vitamin C (ASCORBIC ACID) 500 MG  tablet Take 500 mg by mouth daily.      Marland Kitchen warfarin (COUMADIN) 5 MG tablet Take 5 mg by mouth at bedtime. As directed      . irbesartan-hydrochlorothiazide (AVALIDE) 300-12.5 MG per tablet TAKE 1 TABLET BY MOUTH DAILY.  90 tablet  1   No current facility-administered medications on file prior to visit.    Review of Systems Patient denies any headache.  Occasional light headedness reported.   No cough and congestion.  Some sinus congestion and pressure in ear.  No palpitations.  No chest pain or tightness.   Breathing stable.   No vomiting.  Some  Nausea after taking iron.   No abdominal pain or cramping.  No bowel change, such as diarrhea, BRBPR or melana.  No urine change.  Off avalide.  Blood pressure ok.       Objective:   Physical Exam  Filed Vitals:   08/09/13 0936  BP: 118/64  Pulse: 67  Temp: 97.8 F (36.6 C)   Blood pressure recheck:  46/90  80 year old male in no acute distress.  HEENT:  Nares - clear.   Oropharynx - without lesions. NECK:  Supple.  Nontender.  No audible carotid bruit.  HEART:  Appears to be regular.   LUNGS:  No crackles or wheezing audible.  Respirations even and unlabored.   RADIAL PULSE:  Equal bilaterally.  ABDOMEN:  Soft.  Nontender.  Bowel sounds present and normal.  No audible abdominal bruit.    EXTREMITIES:  No increased edema present.  DP pulses palpable and equal bilaterally.         Assessment & Plan:  INCREASED STRESS/ANXIETY.  Appears to be stable.  On zoloft.   HEALTH MAINTENANCE.  Physical 08/10/12.  Prostate and PSAs followed by Dr Bernardo Heater.  Colonoscopy 08/11/08 revealed diverticulosis and internal hemorrhoids.  Follow up colonoscopy just performed.  Results as outlined.     I spent 25 minutes with the patient and more than 50% of the time was spent in consultation regarding the above.

## 2013-08-13 NOTE — Assessment & Plan Note (Signed)
Saline nasal spray and flonase or nasacort as directed.  Follow.

## 2013-08-13 NOTE — Assessment & Plan Note (Signed)
Rate controlled on current meds.  On coumadin.  Dr Ubaldo Glassing following.

## 2013-08-13 NOTE — Assessment & Plan Note (Signed)
Recheck sodium with next labs.  Has been stable.  Off avalide now.  Follow.

## 2013-08-13 NOTE — Assessment & Plan Note (Signed)
Hgb decreased.  Found to be iron deficient.  Started ferrous sulfate 325mg  q day.  Referred to GI for further evaluation.   EGD and colonoscopy as outlined.  Not tolerating iron.  Start integra daily follow.

## 2013-08-13 NOTE — Assessment & Plan Note (Signed)
Had EGD as outlined.  Relates the nausea to taking the iron.  Change to integra.  Follow.

## 2013-08-13 NOTE — Assessment & Plan Note (Signed)
On Vytorin.  Low cholesterol diet and exercise.  Follow.

## 2013-08-13 NOTE — Assessment & Plan Note (Signed)
History of CAD s/p CABG.  Sees cardiology regularly - Dr Ubaldo Glassing.  Stable.  Due to see Dr Ubaldo Glassing next week.

## 2013-08-15 ENCOUNTER — Encounter: Payer: Self-pay | Admitting: Internal Medicine

## 2013-08-24 ENCOUNTER — Encounter: Payer: Self-pay | Admitting: Internal Medicine

## 2013-08-29 ENCOUNTER — Other Ambulatory Visit: Payer: Self-pay | Admitting: Internal Medicine

## 2013-08-29 ENCOUNTER — Ambulatory Visit (INDEPENDENT_AMBULATORY_CARE_PROVIDER_SITE_OTHER): Payer: Medicare Other | Admitting: *Deleted

## 2013-08-29 DIAGNOSIS — Z23 Encounter for immunization: Secondary | ICD-10-CM

## 2013-11-08 ENCOUNTER — Other Ambulatory Visit (INDEPENDENT_AMBULATORY_CARE_PROVIDER_SITE_OTHER): Payer: Medicare Other

## 2013-11-08 ENCOUNTER — Telehealth: Payer: Self-pay | Admitting: *Deleted

## 2013-11-08 DIAGNOSIS — I10 Essential (primary) hypertension: Secondary | ICD-10-CM | POA: Diagnosis not present

## 2013-11-08 DIAGNOSIS — E7800 Pure hypercholesterolemia, unspecified: Secondary | ICD-10-CM

## 2013-11-08 DIAGNOSIS — E871 Hypo-osmolality and hyponatremia: Secondary | ICD-10-CM

## 2013-11-08 DIAGNOSIS — E876 Hypokalemia: Secondary | ICD-10-CM

## 2013-11-08 DIAGNOSIS — D649 Anemia, unspecified: Secondary | ICD-10-CM

## 2013-11-08 DIAGNOSIS — D508 Other iron deficiency anemias: Secondary | ICD-10-CM

## 2013-11-08 LAB — HEPATIC FUNCTION PANEL
ALK PHOS: 84 U/L (ref 39–117)
ALT: 21 U/L (ref 0–53)
AST: 25 U/L (ref 0–37)
Albumin: 3.6 g/dL (ref 3.5–5.2)
BILIRUBIN TOTAL: 0.5 mg/dL (ref 0.2–1.2)
Bilirubin, Direct: 0.1 mg/dL (ref 0.0–0.3)
Total Protein: 6.7 g/dL (ref 6.0–8.3)

## 2013-11-08 LAB — CBC WITH DIFFERENTIAL/PLATELET
Basophils Absolute: 0 10*3/uL (ref 0.0–0.1)
Basophils Relative: 0.4 % (ref 0.0–3.0)
Eosinophils Absolute: 0.3 10*3/uL (ref 0.0–0.7)
Eosinophils Relative: 4.8 % (ref 0.0–5.0)
HCT: 42.9 % (ref 39.0–52.0)
Hemoglobin: 14 g/dL (ref 13.0–17.0)
Lymphocytes Relative: 20.6 % (ref 12.0–46.0)
Lymphs Abs: 1.5 10*3/uL (ref 0.7–4.0)
MCHC: 32.7 g/dL (ref 30.0–36.0)
MCV: 93.6 fl (ref 78.0–100.0)
Monocytes Absolute: 0.9 10*3/uL (ref 0.1–1.0)
Monocytes Relative: 12.6 % — ABNORMAL HIGH (ref 3.0–12.0)
Neutro Abs: 4.3 10*3/uL (ref 1.4–7.7)
Neutrophils Relative %: 61.6 % (ref 43.0–77.0)
Platelets: 236 10*3/uL (ref 150.0–400.0)
RBC: 4.59 Mil/uL (ref 4.22–5.81)
RDW: 15.1 % (ref 11.5–15.5)
WBC: 7.1 10*3/uL (ref 4.0–10.5)

## 2013-11-08 LAB — FERRITIN: FERRITIN: 81.6 ng/mL (ref 22.0–322.0)

## 2013-11-08 LAB — LIPID PANEL
Cholesterol: 143 mg/dL (ref 0–200)
HDL: 41.2 mg/dL (ref >39.00)
LDL CALC: 70 mg/dL (ref 0–99)
NonHDL: 101.8
TRIGLYCERIDES: 158 mg/dL — AB (ref 0.0–149.0)
Total CHOL/HDL Ratio: 3
VLDL: 31.6 mg/dL (ref 0.0–40.0)

## 2013-11-08 LAB — BASIC METABOLIC PANEL
BUN: 20 mg/dL (ref 6–23)
CALCIUM: 8.9 mg/dL (ref 8.4–10.5)
CO2: 26 meq/L (ref 19–32)
CREATININE: 1 mg/dL (ref 0.4–1.5)
Chloride: 100 mEq/L (ref 96–112)
GFR: 77.93 mL/min (ref >60.00)
GLUCOSE: 98 mg/dL (ref 70–99)
Potassium: 4.1 mEq/L (ref 3.5–5.1)
Sodium: 134 mEq/L — ABNORMAL LOW (ref 135–145)

## 2013-11-08 NOTE — Telephone Encounter (Signed)
Orders placed for labs

## 2013-11-08 NOTE — Telephone Encounter (Signed)
What labs and dx?  

## 2013-11-09 ENCOUNTER — Ambulatory Visit (INDEPENDENT_AMBULATORY_CARE_PROVIDER_SITE_OTHER): Payer: Medicare Other | Admitting: Internal Medicine

## 2013-11-09 ENCOUNTER — Encounter: Payer: Self-pay | Admitting: Internal Medicine

## 2013-11-09 VITALS — BP 120/60 | HR 61 | Temp 97.6°F | Ht 64.5 in | Wt 151.5 lb

## 2013-11-09 DIAGNOSIS — E7800 Pure hypercholesterolemia, unspecified: Secondary | ICD-10-CM

## 2013-11-09 DIAGNOSIS — I4891 Unspecified atrial fibrillation: Secondary | ICD-10-CM

## 2013-11-09 DIAGNOSIS — E871 Hypo-osmolality and hyponatremia: Secondary | ICD-10-CM

## 2013-11-09 DIAGNOSIS — I10 Essential (primary) hypertension: Secondary | ICD-10-CM

## 2013-11-09 DIAGNOSIS — E876 Hypokalemia: Secondary | ICD-10-CM

## 2013-11-09 DIAGNOSIS — D508 Other iron deficiency anemias: Secondary | ICD-10-CM

## 2013-11-09 DIAGNOSIS — I2510 Atherosclerotic heart disease of native coronary artery without angina pectoris: Secondary | ICD-10-CM

## 2013-11-09 MED ORDER — CITALOPRAM HYDROBROMIDE 10 MG PO TABS: 10.0000 mg | ORAL_TABLET | Freq: Every day | ORAL | Status: DC

## 2013-11-09 MED ORDER — PANTOPRAZOLE SODIUM 40 MG PO TBEC: 40.0000 mg | DELAYED_RELEASE_TABLET | Freq: Every day | ORAL | Status: DC

## 2013-11-09 NOTE — Progress Notes (Signed)
Subjective:    [name]: Tyler Stout, male    DOB: 1925-12-22, 80 y.o.   MRN: [id number]  HPI 80 year old male with past history of atherosclerotic heart disease s/p bypass graft x 4, hypercholesterolemia, reflux disease, hypertension and gallstone pancreatitis.  He comes in today to follow up on these issues as well as for a complete physical exam.   Saw GI.  Had EGD and colonoscopy.  See report for details.   No acid reflux.   Eating better.  Breathing stable.  No chest pain or tightness.  Blood pressure varying - averaging 130-140s/60-70s.  He does report having some crazy dreams.  Not resting as well.  Still some increased stress and increased worrying.  Off zoloft.  Previously evaluated at acute care for cough.  Better.      Past Medical History  Diagnosis Date  . CAD (coronary artery disease)     s/p bypass graft x 4 (1991)  . GERD (gastroesophageal reflux disease)   . Atrial fibrillation   . Anemia     iron deficiency  . Hypertension   . Hypercholesterolemia   . Pancreatitis     x2 (1994 and 1998), presumed to be gallstone pancreatitis.  s/p ERCP and sphincterotomy  . Bilateral renal cysts     worked up by Dr Bernardo Heater  . Glaucoma   . BPH (benign prostatic hypertrophy)   . Diverticulosis   . Arthritis     Current Outpatient Prescriptions on File Prior to Visit  Medication Sig Dispense Refill  . atorvastatin (LIPITOR) 20 MG tablet TAKE 1 TABLET (20 MG TOTAL) BY MOUTH DAILY.  90 tablet  3  . Calcium Carbonate-Vitamin D (CALCIUM 600+D) 600-400 MG-UNIT per tablet Take 1 tablet by mouth daily.      . Diltiazem HCl Coated Beads (CARDIZEM CD PO) Take 120 mg by mouth daily. As directed      . Fe Fum-FePoly-Vit C-Vit B3 (INTEGRA) 62.5-62.5-40-3 MG CAPS Take 1 capsule by mouth daily.  30 capsule  2  . fish oil-omega-3 fatty acids 1000 MG capsule Take 1 g by mouth daily.      . hydrochlorothiazide (MICROZIDE) 12.5 MG capsule Take 1 capsule (12.5 mg total) by mouth daily.  30  capsule  2  . HYDROcodone-acetaminophen (NORCO) 10-325 MG per tablet Take 1 tablet by mouth every 6 (six) hours as needed for moderate pain.      Marland Kitchen latanoprost (XALATAN) 0.005 % ophthalmic solution Place 1 drop into both eyes daily. 1 drop in each eye      . metoprolol tartrate (LOPRESSOR) 25 MG tablet Take 25 mg by mouth 2 (two) times daily.      . Multiple Vitamin (MULTI-VITAMIN DAILY PO) Take by mouth.      Marland Kitchen omeprazole (PRILOSEC) 20 MG capsule Take 20 mg by mouth daily.      . sertraline (ZOLOFT) 25 MG tablet Take 1 tablet (25 mg total) by mouth daily.  30 tablet  2  . tamsulosin (FLOMAX) 0.4 MG CAPS capsule Take 1 capsule (0.4 mg total) by mouth daily.  30 capsule  3  . vitamin C (ASCORBIC ACID) 500 MG tablet Take 500 mg by mouth daily.      Marland Kitchen warfarin (COUMADIN) 5 MG tablet Take 5 mg by mouth at bedtime. As directed       No current facility-administered medications on file prior to visit.    Review of Systems Patient denies any headache.  No significant cough or congestion  now.   No palpitations.  No chest pain or tightness.   Breathing stable.   No vomiting.   No abdominal pain or cramping.  No bowel change, such as diarrhea, BRBPR or melana.  No urine change.  Off avalide.  Blood pressure as outlined.   On 1/2 avalide.  Increased stress as outlined.  No depression.       Objective:   Physical Exam  Filed Vitals:   11/09/13 0923  BP: 120/60  Pulse: 61  Temp: 97.6 F (36.4 C)   Blood pressure recheck:  31/58  80 year old male in no acute distress.  HEENT:  Nares - clear.  Oropharynx - without lesions. NECK:  Supple.  Nontender.  No audible carotid bruit.  HEART:  Appears to be regular.   LUNGS:  No crackles or wheezing audible.  Respirations even and unlabored.   RADIAL PULSE:  Equal bilaterally.  ABDOMEN:  Soft.  Nontender.  Bowel sounds present and normal.  No audible abdominal bruit.  GU:  Performed by urology.    EXTREMITIES:  No increased edema present.  DP pulses  palpable and equal bilaterally.         Assessment & Plan:  1. Essential hypertension Blood pressure as outlined.  Same medication regimen.  Follow metabolic panel.   2. Coronary artery disease involving native coronary artery of native heart without angina pectoris Sees cardiology.  Stable.    3. Atrial fibrillation, unspecified On coumadin.  Followed by cardiology.  Stable.    4. Hypercholesteremia Cholesterol just checked 11/08/13 - LDL 70.  Same medication regimen.  Follow.    Lab Results  Component Value Date   CHOL 143 11/08/2013   HDL 41.20 11/08/2013   LDLCALC 70 11/08/2013   TRIG 158.0* 11/08/2013   CHOLHDL 3 11/08/2013   5. Other iron deficiency anemias On integra now.  Follow.  hgb just checked and improved - 14.    6. Hyponatremia Follow sodium.  Just checked - 134.    7. Hypokalemia Potassium checked and wnl.    8.  STRESS.  Increased stress as outlined.  Start citalopram 10mg  q day.  Follow.   HEALTH MAINTENANCE.  Physical today.  Prostate and PSAs followed by Dr Bernardo Heater.  Colonoscopy 08/11/08 revealed diverticulosis and internal hemorrhoids.  Follow up colonoscopy just performed.  Results as outlined.     I spent 25 minutes with the patient and more than 50% of the time was spent in consultation regarding the above.

## 2013-11-09 NOTE — Progress Notes (Signed)
Pre visit review using our clinic review tool, if applicable. No additional management support is needed unless otherwise documented below in the visit note. 

## 2013-11-09 NOTE — Patient Instructions (Addendum)
Stop iron.  Start citalopram 10mg  one per day.   Stop prilosec.    Start protonix (pantoprazole) 40mg  - one per day.  Take 30 minutes before breakfast.

## 2013-11-14 ENCOUNTER — Encounter: Payer: Self-pay | Admitting: Internal Medicine

## 2013-12-28 ENCOUNTER — Encounter: Payer: Self-pay | Admitting: Internal Medicine

## 2013-12-28 ENCOUNTER — Ambulatory Visit (INDEPENDENT_AMBULATORY_CARE_PROVIDER_SITE_OTHER): Payer: Medicare Other | Admitting: Internal Medicine

## 2013-12-28 VITALS — BP 142/70 | HR 68 | Temp 98.3°F | Ht 64.5 in | Wt 153.8 lb

## 2013-12-28 DIAGNOSIS — J029 Acute pharyngitis, unspecified: Secondary | ICD-10-CM | POA: Diagnosis not present

## 2013-12-28 DIAGNOSIS — I2510 Atherosclerotic heart disease of native coronary artery without angina pectoris: Secondary | ICD-10-CM

## 2013-12-28 DIAGNOSIS — R0981 Nasal congestion: Secondary | ICD-10-CM

## 2013-12-28 DIAGNOSIS — I10 Essential (primary) hypertension: Secondary | ICD-10-CM

## 2013-12-28 DIAGNOSIS — E871 Hypo-osmolality and hyponatremia: Secondary | ICD-10-CM

## 2013-12-28 DIAGNOSIS — D508 Other iron deficiency anemias: Secondary | ICD-10-CM

## 2013-12-28 DIAGNOSIS — I4891 Unspecified atrial fibrillation: Secondary | ICD-10-CM | POA: Diagnosis not present

## 2013-12-28 DIAGNOSIS — E7800 Pure hypercholesterolemia, unspecified: Secondary | ICD-10-CM

## 2013-12-28 MED ORDER — CITALOPRAM HYDROBROMIDE 20 MG PO TABS: 20.0000 mg | ORAL_TABLET | Freq: Every day | ORAL | Status: DC

## 2013-12-28 NOTE — Progress Notes (Signed)
Subjective:    [name]: Tyler Stout, male    DOB: [birth year], 80 y.o.   MRN: [id number]  HPI 80 year old male with past history of atherosclerotic heart disease s/p bypass graft x 4, hypercholesterolemia, reflux disease, hypertension and gallstone pancreatitis.  He comes in today for a scheduled follow up.  Saw GI.  Had EGD and colonoscopy.  See report for details.   No acid reflux.   Eating better.  Breathing stable.  No chest pain or tightness. Does report - sore throat.  Some nasal congestion.  Clear water from his nose.  No sinus pressure.  Some cough.  No vomiting or diarrhea.   Blood pressure varying - averaging 130-160/60-70s.  On citalopram.  Feels is helping.        Past Medical History  Diagnosis Date  . CAD (coronary artery disease)     s/p bypass graft x 4 (1991)  . GERD (gastroesophageal reflux disease)   . Atrial fibrillation   . Anemia     iron deficiency  . Hypertension   . Hypercholesterolemia   . Pancreatitis     x2 (1994 and 1998), presumed to be gallstone pancreatitis.  s/p ERCP and sphincterotomy  . Bilateral renal cysts     worked up by Dr Bernardo Heater  . Glaucoma   . BPH (benign prostatic hypertrophy)   . Diverticulosis   . Arthritis     Current Outpatient Prescriptions on File Prior to Visit  Medication Sig Dispense Refill  . atorvastatin (LIPITOR) 20 MG tablet TAKE 1 TABLET (20 MG TOTAL) BY MOUTH DAILY. 90 tablet 3  . Calcium Carbonate-Vitamin D (CALCIUM 600+D) 600-400 MG-UNIT per tablet Take 1 tablet by mouth daily.    . citalopram (CELEXA) 10 MG tablet Take 1 tablet (10 mg total) by mouth daily. 30 tablet 1  . Diltiazem HCl Coated Beads (CARDIZEM CD PO) Take 120 mg by mouth daily. As directed    . Fe Fum-FePoly-Vit C-Vit B3 (INTEGRA) 62.5-62.5-40-3 MG CAPS Take 1 capsule by mouth daily. 30 capsule 2  . fish oil-omega-3 fatty acids 1000 MG capsule Take 1 g by mouth daily.    . hydrochlorothiazide (MICROZIDE) 12.5 MG capsule Take 1 capsule (12.5 mg  total) by mouth daily. 30 capsule 2  . irbesartan-hydrochlorothiazide (AVALIDE) 300-12.5 MG per tablet TAKE 1/2 TABLET BY MOUTH DAILY.    Marland Kitchen latanoprost (XALATAN) 0.005 % ophthalmic solution Place 1 drop into both eyes daily. 1 drop in each eye    . metoprolol tartrate (LOPRESSOR) 25 MG tablet Take 25 mg by mouth 2 (two) times daily.    . Multiple Vitamin (MULTI-VITAMIN DAILY PO) Take by mouth.    . pantoprazole (PROTONIX) 40 MG tablet Take 1 tablet (40 mg total) by mouth daily. 90 tablet 1  . tamsulosin (FLOMAX) 0.4 MG CAPS capsule Take 1 capsule (0.4 mg total) by mouth daily. 30 capsule 3  . vitamin C (ASCORBIC ACID) 500 MG tablet Take 500 mg by mouth daily.    Marland Kitchen warfarin (COUMADIN) 5 MG tablet Take 5 mg by mouth at bedtime. As directed     No current facility-administered medications on file prior to visit.    Review of Systems Patient denies any headache.  Does report some runny nose and nasal congestion.  Sore throat.    No palpitations.  No chest pain or tightness.   Some cough.  No chest congestion.  Breathing stable.   No vomiting.   No abdominal pain or cramping.  No  bowel change, such as diarrhea, BRBPR or melana.  No urine change.   Blood pressure as outlined.   On 1/2 avalide.  Increased stress.  Doing better on citalopram.  Discussed increasing the dose.   No depression.       Objective:   Physical Exam  Filed Vitals:   12/28/13 1437  BP: 142/70  Pulse: 68  Temp: 98.3 F (67.22 C)   80 year old male in no acute distress.  HEENT:  Nares - clear.  Oropharynx - without lesions. NECK:  Supple.  Nontender.  No audible carotid bruit.  HEART:  Appears to be regular.   LUNGS:  No crackles or wheezing audible.  Respirations even and unlabored.   RADIAL PULSE:  Equal bilaterally.  ABDOMEN:  Soft.  Nontender.  Bowel sounds present and normal.  No audible abdominal bruit.  RECTAL:  Enlarged prostate. Heme negative.     EXTREMITIES:  No increased edema present.  DP pulses palpable  and equal bilaterally.         Assessment & Plan:  1. Sore throat Gargle with salt water.  Treat the drainage.  Hold abx.  Follow.   - Throat culture ([facility])  2. Essential hypertension Blood pressure varying.  Same medication regimen.  Follow pressures.    3. Coronary artery disease involving native coronary artery of native heart without angina pectoris Stable.  Sees cardiology.   4. Atrial fibrillation, unspecified Stable.  Rate controlled.    5. Sinus congestion Symptoms as outlined.  Saline nasal spray and nasacort nasal spray as directed.  Gargle with salt water.  Follow.  Hold on abx.    6. Hypercholesteremia Low cholesterol diet.  Continue lipitor.  Follow lipid panel and liver function.   Lab Results  Component Value Date   CHOL 143 11/08/2013   HDL 41.20 11/08/2013   LDLCALC 70 11/08/2013   TRIG 158.0* 11/08/2013   CHOLHDL 3 11/08/2013   7. Other iron deficiency anemias Hgb 11/08/13 - wnl.  Follow.   8. Hyponatremia Sodium 134 11/08/13.       9.  STRESS.  Increased stress as outlined.  Start citalopram 10mg  q day.  Follow.   HEALTH MAINTENANCE.  Physical today.  Prostate and PSAs followed by Dr Bernardo Heater.  Colonoscopy 08/11/08 revealed diverticulosis and internal hemorrhoids.  Follow up colonoscopy just performed.  Results as outlined.     I spent 25 minutes with the patient and more than 50% of the time was spent in consultation regarding the above.

## 2013-12-28 NOTE — Patient Instructions (Addendum)
Saline nasal spray - flush nose at least 2-3x/day  nasacort nasal spray - 2 sprays each nostril one time per day  Robitussin twice a day as needed.

## 2013-12-28 NOTE — Progress Notes (Signed)
Pre visit review using our clinic review tool, if applicable. No additional management support is needed unless otherwise documented below in the visit note. 

## 2013-12-29 ENCOUNTER — Ambulatory Visit: Payer: Medicare Other | Admitting: Internal Medicine

## 2013-12-30 LAB — CULTURE, GROUP A STREP: Organism ID, Bacteria: NORMAL

## 2014-01-01 ENCOUNTER — Encounter: Payer: Self-pay | Admitting: Internal Medicine

## 2014-01-04 ENCOUNTER — Other Ambulatory Visit (INDEPENDENT_AMBULATORY_CARE_PROVIDER_SITE_OTHER): Payer: Medicare Other

## 2014-01-04 DIAGNOSIS — I10 Essential (primary) hypertension: Secondary | ICD-10-CM

## 2014-01-04 DIAGNOSIS — D508 Other iron deficiency anemias: Secondary | ICD-10-CM

## 2014-01-04 DIAGNOSIS — E7800 Pure hypercholesterolemia, unspecified: Secondary | ICD-10-CM

## 2014-01-04 LAB — CBC WITH DIFFERENTIAL/PLATELET
Basophils Absolute: 0 10*3/uL (ref 0.0–0.1)
Basophils Relative: 0.3 % (ref 0.0–3.0)
Eosinophils Absolute: 0.2 10*3/uL (ref 0.0–0.7)
Eosinophils Relative: 2 % (ref 0.0–5.0)
HCT: 37 % — ABNORMAL LOW (ref 39.0–52.0)
Hemoglobin: 12.3 g/dL — ABNORMAL LOW (ref 13.0–17.0)
LYMPHS ABS: 1.2 10*3/uL (ref 0.7–4.0)
LYMPHS PCT: 14.5 % (ref 12.0–46.0)
MCHC: 33.1 g/dL (ref 30.0–36.0)
MCV: 93.3 fl (ref 78.0–100.0)
MONOS PCT: 9.5 % (ref 3.0–12.0)
Monocytes Absolute: 0.8 10*3/uL (ref 0.1–1.0)
Neutro Abs: 6.2 10*3/uL (ref 1.4–7.7)
Neutrophils Relative %: 73.7 % (ref 43.0–77.0)
PLATELETS: 282 10*3/uL (ref 150.0–400.0)
RBC: 3.97 Mil/uL — ABNORMAL LOW (ref 4.22–5.81)
RDW: 14.3 % (ref 11.5–15.5)
WBC: 8.4 10*3/uL (ref 4.0–10.5)

## 2014-01-04 LAB — HEPATIC FUNCTION PANEL
ALT: 16 U/L (ref 0–53)
AST: 21 U/L (ref 0–37)
Albumin: 3.7 g/dL (ref 3.5–5.2)
Alkaline Phosphatase: 71 U/L (ref 39–117)
Bilirubin, Direct: 0 mg/dL (ref 0.0–0.3)
Total Bilirubin: 0.4 mg/dL (ref 0.2–1.2)
Total Protein: 6.5 g/dL (ref 6.0–8.3)

## 2014-01-04 LAB — BASIC METABOLIC PANEL WITH GFR
BUN: 19 mg/dL (ref 6–23)
CO2: 27 meq/L (ref 19–32)
Calcium: 8.5 mg/dL (ref 8.4–10.5)
Chloride: 100 meq/L (ref 96–112)
Creatinine, Ser: 0.9 mg/dL (ref 0.4–1.5)
GFR: 83.86 mL/min
Glucose, Bld: 100 mg/dL — ABNORMAL HIGH (ref 70–99)
Potassium: 4.2 meq/L (ref 3.5–5.1)
Sodium: 133 meq/L — ABNORMAL LOW (ref 135–145)

## 2014-01-04 LAB — LIPID PANEL
Cholesterol: 115 mg/dL (ref 0–200)
HDL: 32.2 mg/dL — AB (ref >39.00)
LDL Cholesterol: 67 mg/dL (ref 0–99)
NonHDL: 82.8
TRIGLYCERIDES: 78 mg/dL (ref 0.0–149.0)
Total CHOL/HDL Ratio: 4
VLDL: 15.6 mg/dL (ref 0.0–40.0)

## 2014-01-04 LAB — FERRITIN: FERRITIN: 123.1 ng/mL (ref 22.0–322.0)

## 2014-01-05 ENCOUNTER — Encounter: Payer: Self-pay | Admitting: *Deleted

## 2014-01-05 ENCOUNTER — Other Ambulatory Visit: Payer: Self-pay | Admitting: Internal Medicine

## 2014-01-05 DIAGNOSIS — D649 Anemia, unspecified: Secondary | ICD-10-CM

## 2014-01-05 NOTE — Progress Notes (Signed)
Orders placed for f/u labs.  

## 2014-01-26 ENCOUNTER — Other Ambulatory Visit (INDEPENDENT_AMBULATORY_CARE_PROVIDER_SITE_OTHER): Payer: Medicare Other

## 2014-01-26 DIAGNOSIS — D649 Anemia, unspecified: Secondary | ICD-10-CM

## 2014-01-26 LAB — CBC WITH DIFFERENTIAL/PLATELET
BASOS PCT: 0.5 % (ref 0.0–3.0)
Basophils Absolute: 0 10*3/uL (ref 0.0–0.1)
Eosinophils Absolute: 0.1 10*3/uL (ref 0.0–0.7)
Eosinophils Relative: 1.6 % (ref 0.0–5.0)
HCT: 39.3 % (ref 39.0–52.0)
HEMOGLOBIN: 12.8 g/dL — AB (ref 13.0–17.0)
LYMPHS ABS: 0.9 10*3/uL (ref 0.7–4.0)
LYMPHS PCT: 12.8 % (ref 12.0–46.0)
MCHC: 32.6 g/dL (ref 30.0–36.0)
MCV: 95 fl (ref 78.0–100.0)
MONOS PCT: 14.7 % — AB (ref 3.0–12.0)
Monocytes Absolute: 1.1 10*3/uL — ABNORMAL HIGH (ref 0.1–1.0)
NEUTROS PCT: 70.4 % (ref 43.0–77.0)
Neutro Abs: 5.1 10*3/uL (ref 1.4–7.7)
PLATELETS: 273 10*3/uL (ref 150.0–400.0)
RBC: 4.14 Mil/uL — ABNORMAL LOW (ref 4.22–5.81)
RDW: 14.5 % (ref 11.5–15.5)
WBC: 7.2 10*3/uL (ref 4.0–10.5)

## 2014-01-26 LAB — VITAMIN B12: VITAMIN B 12: 681 pg/mL (ref 211–911)

## 2014-01-26 LAB — IBC PANEL
IRON: 57 ug/dL (ref 42–165)
SATURATION RATIOS: 15.8 % — AB (ref 20.0–50.0)
TRANSFERRIN: 257 mg/dL (ref 212.0–360.0)

## 2014-01-27 ENCOUNTER — Encounter: Payer: Self-pay | Admitting: *Deleted

## 2014-03-14 ENCOUNTER — Ambulatory Visit (INDEPENDENT_AMBULATORY_CARE_PROVIDER_SITE_OTHER): Payer: Medicare Other | Admitting: Internal Medicine

## 2014-03-14 ENCOUNTER — Encounter: Payer: Self-pay | Admitting: Internal Medicine

## 2014-03-14 VITALS — BP 100/60 | HR 75 | Temp 98.0°F | Ht 64.5 in | Wt 158.5 lb

## 2014-03-14 DIAGNOSIS — E7800 Pure hypercholesterolemia, unspecified: Secondary | ICD-10-CM

## 2014-03-14 DIAGNOSIS — I2510 Atherosclerotic heart disease of native coronary artery without angina pectoris: Secondary | ICD-10-CM

## 2014-03-14 DIAGNOSIS — D508 Other iron deficiency anemias: Secondary | ICD-10-CM

## 2014-03-14 DIAGNOSIS — I10 Essential (primary) hypertension: Secondary | ICD-10-CM

## 2014-03-14 DIAGNOSIS — I4891 Unspecified atrial fibrillation: Secondary | ICD-10-CM | POA: Diagnosis not present

## 2014-03-14 DIAGNOSIS — R42 Dizziness and giddiness: Secondary | ICD-10-CM

## 2014-03-14 DIAGNOSIS — E871 Hypo-osmolality and hyponatremia: Secondary | ICD-10-CM

## 2014-03-14 NOTE — Progress Notes (Signed)
[name]: Tyler Stout, male   DOB: 06/30/1925, 81 y.o.   MRN: [id number]   Subjective:    [name]: Tyler Stout, male    DOB: August 10, 1925, 81 y.o.   MRN: [id number]  HPI  Patient here for a scheduled follow up.  Recently saw Dr Ubaldo Glassing.  His avalide was stopped and he was started on hctz 12.5mg  q day.  Just saw him in f/u.  No changes made.  Pt states he will still noticed getting light headed.  Cardiac evaluation ok.  He is following his carotid arteries - carotid ultrasound.  No chest pain or tightness.  Breathing stable.  Eating and drinking well.  He is accompanied by his wife.  History obtained from both of them.  Blood pressures have been varying.  Some readings as low as 106-109/60s.     Past Medical History  Diagnosis Date  . CAD (coronary artery disease)     s/p bypass graft x 4 (1991)  . GERD (gastroesophageal reflux disease)   . Atrial fibrillation   . Anemia     iron deficiency  . Hypertension   . Hypercholesterolemia   . Pancreatitis     x2 (1994 and 1998), presumed to be gallstone pancreatitis.  s/p ERCP and sphincterotomy  . Bilateral renal cysts     worked up by Dr Bernardo Heater  . Glaucoma   . BPH (benign prostatic hypertrophy)   . Diverticulosis   . Arthritis     Outpatient Encounter Prescriptions as of 03/14/2014  Medication Sig  . atorvastatin (LIPITOR) 20 MG tablet TAKE 1 TABLET (20 MG TOTAL) BY MOUTH DAILY.  . Calcium Carbonate-Vitamin D (CALCIUM 600+D) 600-400 MG-UNIT per tablet Take 1 tablet by mouth daily.  . citalopram (CELEXA) 20 MG tablet Take 1 tablet (20 mg total) by mouth daily.  . Diltiazem HCl Coated Beads (CARDIZEM CD PO) Take 120 mg by mouth daily. As directed  . Fe Fum-FePoly-Vit C-Vit B3 (INTEGRA) 62.5-62.5-40-3 MG CAPS Take 1 capsule by mouth daily.  . fish oil-omega-3 fatty acids 1000 MG capsule Take 1 g by mouth daily.  . hydrochlorothiazide (MICROZIDE) 12.5 MG capsule Take 1 capsule (12.5 mg total) by mouth daily.  Marland Kitchen latanoprost (XALATAN)  0.005 % ophthalmic solution Place 1 drop into both eyes daily. 1 drop in each eye  . metoprolol tartrate (LOPRESSOR) 25 MG tablet Take 25 mg by mouth 2 (two) times daily.  . Multiple Vitamin (MULTI-VITAMIN DAILY PO) Take by mouth.  . pantoprazole (PROTONIX) 40 MG tablet Take 1 tablet (40 mg total) by mouth daily.  . tamsulosin (FLOMAX) 0.4 MG CAPS capsule Take 1 capsule (0.4 mg total) by mouth daily.  . vitamin C (ASCORBIC ACID) 500 MG tablet Take 500 mg by mouth daily.  Marland Kitchen warfarin (COUMADIN) 5 MG tablet Take 5 mg by mouth at bedtime. As directed  . [DISCONTINUED] irbesartan-hydrochlorothiazide (AVALIDE) 300-12.5 MG per tablet TAKE 1/2 TABLET BY MOUTH DAILY.     Review of Systems  Constitutional: Negative for appetite change and unexpected weight change.  HENT: Negative for congestion and sinus pressure.   Respiratory: Negative for cough, chest tightness and shortness of breath (no increased sob.  feels breathing is stable.  ).   Cardiovascular: Negative for chest pain, palpitations and leg swelling.  Gastrointestinal: Negative for nausea, vomiting, abdominal pain and diarrhea.  Genitourinary: Negative for dysuria and difficulty urinating.  Musculoskeletal: Negative for joint swelling and neck pain.  Skin: Negative for color change and rash.  Neurological: Negative  for headaches.       Feels light headed at times as outlined.    Psychiatric/Behavioral: Negative for dysphoric mood and agitation.       Objective:      Physical Exam  Constitutional: He is oriented to person, place, and time. He appears well-developed and well-nourished. No distress.  HENT:  Nose: Nose normal.  Mouth/Throat: Oropharynx is clear and moist.  Neck: Neck supple. No thyromegaly present.  Cardiovascular: Normal rate and regular rhythm.   Pulmonary/Chest: Effort normal and breath sounds normal. No respiratory distress.  Abdominal: Soft. Bowel sounds are normal. There is no tenderness.  Musculoskeletal: He  exhibits no edema or tenderness.  Lymphadenopathy:    He has no cervical adenopathy.  Neurological: He is alert and oriented to person, place, and time.  Skin: No rash noted. No erythema.  Psychiatric: He has a normal mood and affect. His behavior is normal.    BP 100/60 mmHg  Pulse 75  Temp(Src) 98 F (36.7 C) (Oral)  Ht 5' 4.5" (1.638 m)  Wt 158 lb 8 oz (71.895 kg)  BMI 26.80 kg/m2  SpO2 97% Wt Readings from Last 3 Encounters:  03/14/14 158 lb 8 oz (71.895 kg)  12/28/13 153 lb 12 oz (69.741 kg)  11/09/13 151 lb 8 oz (68.72 kg)     Lab Results  Component Value Date   WBC 7.2 01/26/2014   HGB 12.8* 01/26/2014   HCT 39.3 01/26/2014   PLT 273.0 01/26/2014   GLUCOSE 100* 01/04/2014   CHOL 115 01/04/2014   TRIG 78.0 01/04/2014   HDL 32.20* 01/04/2014   LDLCALC 67 01/04/2014   ALT 16 01/04/2014   AST 21 01/04/2014   NA 133* 01/04/2014   K 4.2 01/04/2014   CL 100 01/04/2014   CREATININE 0.9 01/04/2014   BUN 19 01/04/2014   CO2 27 01/04/2014   TSH 1.41 06/01/2013   INR 2.3* 08/03/2012   HGBA1C 6.2 03/15/2012       Assessment & Plan:   Problem List Items Addressed This Visit    Anemia    EGD and colonoscopy as outlined in overview.  Check cbc today.        Relevant Orders   CBC with Differential/Platelet   Ferritin   Atrial fibrillation    Rate controlled.  On coumadin.  Pt/inr followed by cardiology.        Relevant Orders   TSH   CAD (coronary artery disease)    Sees Dr Ubaldo Glassing.  Recent cardiac evaluation ok.        Hypercholesteremia    On vytorin.  Low cholesterol diet and exercise.  Check lipid panel and liver function tests.       Relevant Orders   Lipid panel   Hepatic function panel   Hypertension - Primary    Blood pressure running low.  Has been off avalide for 6 weeks.  On hctz 12.5mg  q day.  Stop the hctz.  Follow pressures.  Get him back in soon to reassess.  Will see if this helps his light headedness.        Relevant Orders   Basic  metabolic panel   Hyponatremia    Recheck sodium with next labs.        Light headedness    Persistent intermittent episodes as outlined.  Unclear etiology.  Blood pressure as outlined.  Will stop hctz and see if allowing the blood pressure to run a little higher - improves symptoms.  Had cardiac w/up -  negative.  Get him back in soon to reassess.  If persistent, may require neurology evaluation.  Cardiology following his carotids.          I spent 25 minutes with the patient and more than 50% of the time was spent in consultation regarding the above.     [name], MD

## 2014-03-14 NOTE — Progress Notes (Signed)
Pre visit review using our clinic review tool, if applicable. No additional management support is needed unless otherwise documented below in the visit note. 

## 2014-03-19 DIAGNOSIS — R42 Dizziness and giddiness: Secondary | ICD-10-CM | POA: Insufficient documentation

## 2014-03-19 NOTE — Assessment & Plan Note (Signed)
On vytorin.  Low cholesterol diet and exercise.  Check lipid panel and liver function tests.

## 2014-03-19 NOTE — Assessment & Plan Note (Signed)
Blood pressure running low.  Has been off avalide for 6 weeks.  On hctz 12.5mg  q day.  Stop the hctz.  Follow pressures.  Get him back in soon to reassess.  Will see if this helps his light headedness.

## 2014-03-19 NOTE — Assessment & Plan Note (Signed)
Persistent intermittent episodes as outlined.  Unclear etiology.  Blood pressure as outlined.  Will stop hctz and see if allowing the blood pressure to run a little higher - improves symptoms.  Had cardiac w/up - negative.  Get him back in soon to reassess.  If persistent, may require neurology evaluation.  Cardiology following his carotids.

## 2014-03-19 NOTE — Assessment & Plan Note (Signed)
Rate controlled.  On coumadin.  Pt/inr followed by cardiology.

## 2014-03-19 NOTE — Assessment & Plan Note (Signed)
EGD and colonoscopy as outlined in overview.  Check cbc today.

## 2014-03-19 NOTE — Assessment & Plan Note (Signed)
Recheck sodium with next labs.

## 2014-03-19 NOTE — Assessment & Plan Note (Signed)
Sees Dr Ubaldo Glassing.  Recent cardiac evaluation ok.

## 2014-03-20 ENCOUNTER — Other Ambulatory Visit: Payer: Self-pay | Admitting: Internal Medicine

## 2014-03-23 ENCOUNTER — Telehealth: Payer: Self-pay | Admitting: *Deleted

## 2014-03-23 NOTE — Telephone Encounter (Signed)
Saw Dr. Ubaldo Glassing this morning & started on Digoxin & scheduled for a Echo next week. Had CXR done today-waiting on results

## 2014-03-23 NOTE — Telephone Encounter (Signed)
If leg swelling and more sob - given his heart history - I feel he needs to be evaluated.  He sees cardiology.  Can see if they can see today, or to [facility] acute care for evaluation.  - can get labs if needed, etc.  I stopped his hctz last visit.  How is his blood pressure running?  Let me know if any problems.

## 2014-03-23 NOTE — Telephone Encounter (Signed)
Daughter Roberts Gaudy) called to report that Tyler Stout has been c/o SOB (worse today) , right leg swelling below knee, & his weight today is: 157lbs (which is up a bit for him). Please advise

## 2014-04-27 ENCOUNTER — Other Ambulatory Visit: Payer: Self-pay | Admitting: Internal Medicine

## 2014-05-01 ENCOUNTER — Other Ambulatory Visit (INDEPENDENT_AMBULATORY_CARE_PROVIDER_SITE_OTHER): Payer: Medicare Other

## 2014-05-01 DIAGNOSIS — E7800 Pure hypercholesterolemia, unspecified: Secondary | ICD-10-CM

## 2014-05-01 DIAGNOSIS — I4891 Unspecified atrial fibrillation: Secondary | ICD-10-CM

## 2014-05-01 DIAGNOSIS — I10 Essential (primary) hypertension: Secondary | ICD-10-CM

## 2014-05-01 DIAGNOSIS — D508 Other iron deficiency anemias: Secondary | ICD-10-CM | POA: Diagnosis not present

## 2014-05-01 LAB — CBC WITH DIFFERENTIAL/PLATELET
Basophils Absolute: 0 10*3/uL (ref 0.0–0.1)
Basophils Relative: 0.4 % (ref 0.0–3.0)
Eosinophils Absolute: 0.1 10*3/uL (ref 0.0–0.7)
Eosinophils Relative: 1.9 % (ref 0.0–5.0)
HEMATOCRIT: 38.2 % — AB (ref 39.0–52.0)
Hemoglobin: 12.8 g/dL — ABNORMAL LOW (ref 13.0–17.0)
LYMPHS ABS: 1.3 10*3/uL (ref 0.7–4.0)
Lymphocytes Relative: 19.8 % (ref 12.0–46.0)
MCHC: 33.4 g/dL (ref 30.0–36.0)
MCV: 88.5 fl (ref 78.0–100.0)
MONO ABS: 0.7 10*3/uL (ref 0.1–1.0)
MONOS PCT: 11 % (ref 3.0–12.0)
Neutro Abs: 4.6 10*3/uL (ref 1.4–7.7)
Neutrophils Relative %: 66.9 % (ref 43.0–77.0)
Platelets: 230 10*3/uL (ref 150.0–400.0)
RBC: 4.32 Mil/uL (ref 4.22–5.81)
RDW: 15.3 % (ref 11.5–15.5)
WBC: 6.8 10*3/uL (ref 4.0–10.5)

## 2014-05-01 LAB — HEPATIC FUNCTION PANEL
ALT: 16 U/L (ref 0–53)
AST: 21 U/L (ref 0–37)
Albumin: 4.1 g/dL (ref 3.5–5.2)
Alkaline Phosphatase: 77 U/L (ref 39–117)
BILIRUBIN TOTAL: 0.5 mg/dL (ref 0.2–1.2)
Bilirubin, Direct: 0.1 mg/dL (ref 0.0–0.3)
Total Protein: 6.9 g/dL (ref 6.0–8.3)

## 2014-05-01 LAB — BASIC METABOLIC PANEL
BUN: 32 mg/dL — ABNORMAL HIGH (ref 6–23)
CO2: 27 mEq/L (ref 19–32)
Calcium: 9.4 mg/dL (ref 8.4–10.5)
Chloride: 103 mEq/L (ref 96–112)
Creatinine, Ser: 1 mg/dL (ref 0.40–1.50)
GFR: 75.16 mL/min (ref >60.00)
Glucose, Bld: 108 mg/dL — ABNORMAL HIGH (ref 70–99)
POTASSIUM: 3.8 meq/L (ref 3.5–5.1)
SODIUM: 138 meq/L (ref 135–145)

## 2014-05-01 LAB — LIPID PANEL
CHOL/HDL RATIO: 3
Cholesterol: 117 mg/dL (ref 0–200)
HDL: 35.3 mg/dL — ABNORMAL LOW (ref >39.00)
LDL CALC: 54 mg/dL (ref 0–99)
NonHDL: 81.7
Triglycerides: 141 mg/dL (ref 0.0–149.0)
VLDL: 28.2 mg/dL (ref 0.0–40.0)

## 2014-05-01 LAB — FERRITIN: Ferritin: 61.7 ng/mL (ref 22.0–322.0)

## 2014-05-01 LAB — TSH: TSH: 1.99 u[IU]/mL (ref 0.35–4.50)

## 2014-05-02 ENCOUNTER — Encounter: Payer: Self-pay | Admitting: Internal Medicine

## 2014-05-02 ENCOUNTER — Ambulatory Visit (INDEPENDENT_AMBULATORY_CARE_PROVIDER_SITE_OTHER): Payer: Medicare Other | Admitting: Internal Medicine

## 2014-05-02 VITALS — BP 120/60 | HR 64 | Temp 97.6°F | Ht 64.5 in | Wt 153.4 lb

## 2014-05-02 DIAGNOSIS — I10 Essential (primary) hypertension: Secondary | ICD-10-CM

## 2014-05-02 DIAGNOSIS — E7800 Pure hypercholesterolemia, unspecified: Secondary | ICD-10-CM

## 2014-05-02 DIAGNOSIS — E876 Hypokalemia: Secondary | ICD-10-CM

## 2014-05-02 DIAGNOSIS — I2510 Atherosclerotic heart disease of native coronary artery without angina pectoris: Secondary | ICD-10-CM | POA: Diagnosis not present

## 2014-05-02 DIAGNOSIS — E871 Hypo-osmolality and hyponatremia: Secondary | ICD-10-CM | POA: Diagnosis not present

## 2014-05-02 DIAGNOSIS — I4891 Unspecified atrial fibrillation: Secondary | ICD-10-CM | POA: Diagnosis not present

## 2014-05-02 NOTE — Progress Notes (Signed)
Pre visit review using our clinic review tool, if applicable. No additional management support is needed unless otherwise documented below in the visit note. 

## 2014-05-07 NOTE — Discharge Summary (Signed)
PATIENT NAME:  Tyler Stout, Tyler Stout MR#:  [id number] DATE OF BIRTH:  [birth year]  DATE OF ADMISSION:  05/27/2011 DATE OF DISCHARGE:  05/28/2011  For a detailed note, please take a look at the history and physical done on admission.   DISCHARGE DIAGNOSES: 1. New onset atrial fibrillation. Now converted to sinus rhythm. 2. Hypertension. 3. Glaucoma. 4. History of coronary artery disease, status post coronary artery bypass graft.  5. Hyperlipidemia.   DIET: The patient is being discharged on a low sodium, low fat diet.   ACTIVITY: As tolerated.   DISCHARGE FOLLOWUP: Followup with Dr. Jordan Hawks in the next 3 to 4 days and also follow up with Dr. [name] in the next 1 to 2 weeks.   DISCHARGE MEDICATIONS:  1. Lumigan one drop to each eye at bedtime.  2. Vitamin C 1000 mg daily.  3. Fish oil daily.  4. Multivitamin daily.  5. Vytorin 10/20 mg one  tab daily.  6. Calcium with vitamin D 1 tab daily.  7. Metoprolol tartrate 25 mg p.o. twice a day. 8. Cardizem CD 120 mg daily.  9. Coumadin 4 mg daily.   CONSULTANTS: Bartholome Bill, MD - Cardiology.   PERTINENT STUDIES: Chest x-ray on admission showed no acute cardiopulmonary disease.   HOSPITAL COURSE: This is an 81 year old male with medical problems as mentioned above who presented to the hospital with palpitations and noted to be in new onset atrial fibrillation.  1. New onset atrial fibrillation: The patient was noted to be in severe supraventricular tachycardia when he presented to the ER. He received one dose of adenosine and multiple doses of Cardizem with some improvement in his rate. He was started on some p.o. beta blocker and also Cardizem and has since then now converted to a sinus rhythm. The patient was seen by cardiology and he apparently has a previous history of atrial fibrillation when he had his coronary artery bypass graft but has since then resolved. Given the fact that now he has paroxysmal atrial fibrillation he was  started on anticoagulation and the patient opted for heparin and Coumadin. The patient presently is being discharged on metoprolol and Cardizem along with Coumadin for anticoagulation. The patient will have close follow-up with cardiology as an outpatient with a repeat INR early next week.  2. Hypertension: Since the patient was started on two new antihypertensives, metoprolol and Cardizem, he was told to discontinue his Avalide and Norvasc. His current antihypertensive regimen is metoprolol and Cardizem, as stated.  3. Glaucoma: The patient was maintained on his Lumigan. He will resume that.  4. History of coronary artery disease, status post coronary artery bypass graft: The patient had no chest pain. He was observed on telemetry. He was initially in atrial fibrillation then converted to a normal sinus rhythm. His enzymes x3 were negative. For now he will continue his Coumadin, his statin, and his metoprolol as stated.  5. Hyperlipidemia: The patient was maintained on his Vytorin. He will resume that upon discharge along with his fish oil supplements.   CODE STATUS: FULL CODE.   TIME SPENT: 40 minutes.  ____________________________ Belia Heman. Verdell Carmine, MD vjs:slb D: 05/28/2011 14:18:35 ET     T: 05/29/2011 10:30:00 ET       JOB#: [id number] cc: Belia Heman. Verdell Carmine, MD, <Dictator> [name], MD Javier Docker. Ubaldo Glassing, MD Henreitta Leber MD ELECTRONICALLY SIGNED 05/29/2011 15:42

## 2014-05-07 NOTE — Consult Note (Signed)
Brief Consult Note: Patient was seen by consultant.   Recommend further assessment or treatment.   Comments: pt with history of cad s/p cabg in 1999 who had postbcabg afib but no documented afib since who was admitted with rapid heartnrate symptoms.  noted to be in afib with rapid ventricular response.  he has ruled out for an mi thus far. no contrainDications to anticoagulation.  chads score is 2. will add cardizem 30 q 6, coumadin and follow rate  full note to follow.  Electronic Signatures: Teodoro Spray (MD)  (Signed 14-May-13 21:38)  Authored: Brief Consult Note   Last Updated: 14-May-13 21:38 by Teodoro Spray (MD)

## 2014-05-07 NOTE — H&P (Signed)
PATIENT NAME:  GRESHAM, CAETANO MR#:  [id number] DATE OF BIRTH:  Oct 04, 1925  DATE OF ADMISSION:  05/27/2011  PRIMARY CARE PHYSICIAN: Dr. [name].   CHIEF COMPLAINT: Palpitations.   HISTORY OF PRESENT ILLNESS: This is an 81 year old male who presents to the hospital secondary to palpitations and noted to be new onset atrial fibrillation. The patient apparently woke up at around 5:00 this morning to use the bathroom and felt that his heart rate was significantly high. He usually is able to control his heart rate by doing a Valsalva maneuver, but it did not improve his symptoms. He became very lightheaded and continued to have palpitations. Therefore, came to the ER for further evaluation. At triage, the patient was noted to have tachycardia with heart rates in the 120s. He was noted to be in new onset atrial fibrillation, got one dose of adenosine, also two doses of IV Cardizem push which seemed to improve his symptoms. Hospitalist services were then contacted for further treatment and evaluation.   REVIEW OF SYSTEMS: CONSTITUTIONAL: No documented fever. No weight gain or weight loss. EYES: No blurred or double vision. ENT: No tinnitus or postnasal drip. No redness of the oropharynx. RESPIRATORY: No cough, no wheeze, no hemoptysis. CARDIOVASCULAR: No chest pain, no orthopnea. Positive palpitations. No syncope. GASTROINTESTINAL: No nausea, no vomiting, no diarrhea, no abdominal pain, no melena or hematochezia. GU: No dysuria or hematuria. ENDOCRINE: No polyuria or nocturia. No heat or cold intolerance. HEMATOLOGIC: No anemia, no bruising. No bleeding. INTEGUMENTARY: No rashes. No lesions. MUSCULOSKELETAL: No arthritis, no swelling, and no gout. NEUROLOGIC: No numbness, no tingling, no ataxia, no seizure-type activity. PSYCH: No anxiety, no insomnia, no ADD.   PAST MEDICAL HISTORY:    1. Hypertension.  2. Hyperlipidemia.  3. History of coronary artery disease, status post coronary artery bypass  graft. 4. Benign prostatic hypertrophy. 5. Osteoporosis.   ALLERGIES: Penicillin and Ilosone.   SOCIAL HISTORY: No smoking. No alcohol abuse. No illicit drug abuse. Lives at home with his wife.   FAMILY HISTORY: Mother died from old age. Father died from oral cancer.   CURRENT MEDICATIONS:  1. Aspirin 325 mg daily.  2. Avalide 300/12.5, one tab daily.  3. Calcium with vitamin D 1 tab b.i.d.  4. Lumigan one drop to both eyes at bedtime.  5. Multivitamin daily.  6. Norvasc 5 mg daily.  7. Vitamin C 1000 mg daily.  8. Vytorin 10/20, one tablet daily.   PHYSICAL EXAMINATION ON ADMISSION:  VITAL SIGNS: Temperature 96.3, pulse 122, respirations 18, blood pressure 154/76, sats 98% on room air.   GENERAL: The patient is a pleasant appearing male in no apparent distress.   HEENT: Atraumatic, normocephalic. Extraocular muscles are intact. Pupils are equal and reactive to light. Sclerae anicteric. No conjunctival injection. No pharyngeal erythema.   NECK: Supple. No jugular venous distention. No bruits, no lymphadenopathy, no thyromegaly.   HEART: Irregular. No murmurs, no rubs, no clicks.   LUNGS: Clear to auscultation bilaterally. No rales, no rhonchi, no wheezes.   ABDOMEN: Soft, flat, nontender, nondistended. Has good bowel sounds. No hepatosplenomegaly appreciated.   EXTREMITIES: No evidence of any cyanosis, clubbing, or peripheral edema. Has +2 pedal and radial pulses bilaterally.   NEUROLOGIC: The patient is alert, awake, and oriented x3 with no focal motor or sensory deficits appreciated bilaterally.   SKIN: Moist and warm with no rash appreciated.   LYMPHATIC: There is no cervical or axillary lymphadenopathy.   LABORATORY, DIAGNOSTIC, AND RADIOLOGICAL DATA: Serum glucose  111, BUN 14, creatinine 0.8, sodium 134, potassium 4.1, chloride 100, bicarbonate 27. Liver function tests are within normal limits. Troponin less than 0.02. White cell count 6.5, hemoglobin 13.7, hematocrit  40.1, platelet count 247. Prothrombin time 13.8, INR 1.0. The patient's EKG did show atrial fibrillation with rapid ventricular response. The patient did have a chest x-ray done which showed no evidence of any acute cardiopulmonary disease. The patient has a history of prior coronary artery bypass graft.   ASSESSMENT AND PLAN: This is an 81 year old male with history of hypertension, benign prostatic hypertrophy, glaucoma, coronary artery disease, status post coronary artery bypass graft, hyperlipidemia, who presents to the hospital with palpitations and noted to be in new onset atrial fibrillation.  1. New onset atrial fibrillation. The patient's symptoms and heart rate have improved with some adenosine and IV Cardizem here in the ER. His heart rate presently is in the low 100s. As per the patient, he does say he has a history of irregular heartbeat, but I could not find any records of this based on Dr. Bethanne Ginger notes from the [facility]. For now, will start him on some beta blockers for rate control. He has received one dose of Lovenox here in the Emergency Room. I will start him on a heparin nomogram. He had a recent stress test and echocardiogram done in February this past year. Therefore, I will not repeat those pending further cardiologic evaluation. We will go ahead and cycle his cardiac markers and follow him clinically for now.  2. Hypertension. The patient presently is hemodynamically stable. I will resume his Avalide and Norvasc.  3. Hyperlipidemia. Continue with Zetia and Zocor.  4. Glaucoma. Continue with Lumigan eyedrops.  5. Osteoporosis. Continue with calcium and vitamin D supplements.  6. History of coronary artery disease, status post coronary artery bypass graft. The patient did not have any chest pain. EKG does not show any acute ST-T wave changes. We will cycle his cardiac markers. He is going to be on a heparin drip. He is currently on a beta blocker and we will continue statin for  now.   CODE STATUS: The patient is a FULL CODE.    TIME SPENT WITH THE ADMISSION:  50 minutes.    ____________________________ Belia Heman. Verdell Carmine, MD vjs:ap D: 05/27/2011 10:40:15 ET T: 05/27/2011 11:19:16 ET JOB#: [id number]  cc: Belia Heman. Verdell Carmine, MD, <Dictator> [name], MD Henreitta Leber MD ELECTRONICALLY SIGNED 05/27/2011 16:44

## 2014-05-07 NOTE — Consult Note (Signed)
General Aspect the patient is a 81 year old male with history of coronary artery disease status post coronary artery bypass grafting in 1999 who had transient postoperative atrial fibrillation.  He is now admitted with an irregular heartbeat and weakness.  He was noted to be in atrial fibrillation with rapid ventricular response.  His rate is improved with current medical therapy.  He is ruled out for myocardial infarction.  He has no contraindications to chronic anticoagulation therapy and a chads score of 2   Physical Exam:   GEN no acute distress    HEENT PERRL    NECK supple    RESP normal resp effort  clear BS    CARD Irregular rate and rhythm  Tachycardic  Normal, S1, S2    ABD denies tenderness  normal BS  no Adominal Mass    LYMPH negative neck    EXTR negative cyanosis/clubbing, negative edema    SKIN normal to palpation    NEURO cranial nerves intact, motor/sensory function intact    PSYCH A+O to time, place, person   Review of Systems:   Subjective/Chief Complaint weakness and fatigue with irregular heart    General: Fatigue  Weakness    Skin: No Complaints    ENT: No Complaints    Eyes: No Complaints    Neck: No Complaints    Respiratory: No Complaints    Cardiovascular: Palpitations    Gastrointestinal: No Complaints    Genitourinary: No Complaints    Vascular: No Complaints    Musculoskeletal: No Complaints    Neurologic: No Complaints    Hematologic: No Complaints    Endocrine: No Complaints    Psychiatric: No Complaints    Review of Systems: All other systems were reviewed and found to be negative    Medications/Allergies Reviewed Medications/Allergies reviewed     pancreatitis:    AAA repair x2:    gall bladder removal:    quadruple bypass:        Admitting:   ATRIAL FIBRILLATION: (78295) 03-Jun-2011, Active, ICD9, ATRIAL FIBRILLATION, ATRIAL FIBRILLATION     POA=Yes      D/C Diagnosis:   ATRIAL FIBRILLATION: (62130)  Active, ICD9, ATRIAL FIBRILLATION, ATRIAL FIBRILLATION     POA=Yes      Admit Diagnosis:   AFIB: 29-May-2011, Active, AFIB      Secondary:   OSTEOPOROSIS NOS: (73300) Active, ICD9, OSTEOPOROSIS NOS, OSTEOPOROSIS NOS     POA=Yes   HYPERLIPIDEMIA NEC/NOS: (2724) Active, ICD9, HYPERLIPIDEMIA NEC/NOS, HYPERLIPIDEMIA NEC/NOS     POA=Yes   AORTOCORONARY BYPASS: (Q6578) Active, ICD9, AORTOCORONARY BYPASS, AORTOCORONARY BYPASS   CAD UNS VESSEL/NATIVE/GR: (41400) Active, ICD9, CAD UNS VESSEL/NATIVE/GR, CAD UNS VESSEL/NATIVE/GR     POA=Yes   HYPERTENSION NOS: (4019) Active, ICD9, HYPERTENSION NOS, HYPERTENSION NOS     POA=Yes   ANTICOAGULANT USE LONG T: (I6962) Active, ICD9, ANTICOAGULANT USE LONG T, ANTICOAGULANT USE LONG T   LONG-TERM ASPIRIN USE: (X5284) Active, ICD9, Undefined, LONG-TERM ASPIRIN USE   TACHYCARDIA NOS: (7850) Active, ICD9, TACHYCARDIA NOS, TACHYCARDIA NOS     POA=Yes   HX-DRUG ALLERGY NEC: (V148) Active, ICD9, HX-DRUG ALLERGY NEC, HX-DRUG ALLERGY NEC   HX-PENICILLIN ALLERGY: (V140) Active, ICD9, HX-PENICILLIN ALLERGY, HX-PENICILLIN ALLERGY   LONG-TERM (CURRENT) USE: (X3244) Active, ICD9, Undefined, LONG-TERM (CURRENT) USE      Admit Reason:   Atrial fibrillation: (427.31) Active, ICD9, Atrial fibrillation  Home Medications: Medication Instructions Status  Lumigan 0.03% ophthalmic solution 1 gtt drop to both eyes once a day (at bedtime)  Active  Vitamin C 1000 mg oral tablet 1 tab(s) orally once a day  Active  Cardizem CD 120 mg/24 hours oral capsule, extended release 1 cap(s) orally once a day Active  Coumadin 4 mg oral tablet 1 tab(s) orally once a day Active  metoprolol tartrate 25 mg oral tablet 1 tab(s) orally 2 times a day Active  Calcium 600+D 1 tab(s) orally once a day Active  Vytorin 10 mg-20 mg oral tablet 1 tab(s) orally once a day Active  multivitamin 1 tab(s) orally once a day Active  Fish Oil oral capsule 1 tab(s) orally once a day Active   EKG:    Interpretation atrial fibrillation with variable ventricular response    Ilosone: GI Distress  PCN: Other    Impression 81 year old male with history of coronary artery disease status post coronary artery bypass grafting who now is admitted with atrial fibrillation with variable ventricular response.  He is ruled out for myocardial infarction.  His rate is improving with Cardizem and beta blockers.  He has no absolute contraindication to chronic anticoagulation therapy and has a chads2 score of 2.  would continue to control rate and proceed with chronic anticoagulation therapy.    Plan 1.  Continue with beta blockers and calcium trackable blockers to control rate 2.  Add Coumadin 4 mg daily to anticoagulate based on guidelines 3.  Low-fat, low-cholesterol, low-sodium diet 4.  Amylase and discharged to home.  Will followup INR and further treatment of atrial fibrillation as an outpatient.   Electronic Signatures: Teodoro Spray (MD)  (Signed [phone] 09:36)  Authored: General Aspect/Present Illness, History and Physical Exam, Review of System, Past Medical History, Health Issues, Home Medications, EKG , Allergies, Impression/Plan   Last Updated: 28-May-13 09:36 by Teodoro Spray (MD)

## 2014-05-08 ENCOUNTER — Encounter: Payer: Self-pay | Admitting: Internal Medicine

## 2014-05-08 NOTE — Assessment & Plan Note (Signed)
Followed by cardiology. Stable.   

## 2014-05-08 NOTE — Assessment & Plan Note (Signed)
05/01/14 - sodium wnl.

## 2014-05-08 NOTE — Assessment & Plan Note (Signed)
On lipitor.  Low cholesterol diet and exercise.  Follow lipid panel and liver function tests.   

## 2014-05-08 NOTE — Assessment & Plan Note (Signed)
Blood pressure doing better.  Follow pressures.  Same medication regimen.  Follow metabolic panel.

## 2014-05-08 NOTE — Progress Notes (Signed)
[name]: Tyler Stout, male   DOB: Mar 06, 1925, 81 y.o.   MRN: [id number]   Subjective:    [name]: Tyler Stout, male    DOB: 1925-10-06, 81 y.o.   MRN: [id number]  HPI  Patient here for a scheduled follow up.  States he is doing relatively well.  Breathing is better.  Seeing cardiology.  Recently started on digoxin.  Taking furosemide.  Walking some.  No change in cardiac symptoms.  Breathing stable.  No acid reflux.     Past Medical History  Diagnosis Date  . CAD (coronary artery disease)     s/p bypass graft x 4 (1991)  . GERD (gastroesophageal reflux disease)   . Atrial fibrillation   . Anemia     iron deficiency  . Hypertension   . Hypercholesterolemia   . Pancreatitis     x2 (1994 and 1998), presumed to be gallstone pancreatitis.  s/p ERCP and sphincterotomy  . Bilateral renal cysts     worked up by Dr Bernardo Heater  . Glaucoma   . BPH (benign prostatic hypertrophy)   . Diverticulosis   . Arthritis     Outpatient Encounter Prescriptions as of 05/02/2014  Medication Sig  . atorvastatin (LIPITOR) 20 MG tablet TAKE 1 TABLET (20 MG TOTAL) BY MOUTH DAILY.  . Calcium Carbonate-Vitamin D (CALCIUM 600+D) 600-400 MG-UNIT per tablet Take 1 tablet by mouth daily.  . citalopram (CELEXA) 20 MG tablet TAKE 1 TABLET (20 MG TOTAL) BY MOUTH DAILY.  . digoxin (LANOXIN) 0.125 MG tablet Take 125 mcg by mouth daily.  . Diltiazem HCl Coated Beads (CARDIZEM CD PO) Take 120 mg by mouth daily. As directed  . Fe Fum-FePoly-Vit C-Vit B3 (INTEGRA) 62.5-62.5-40-3 MG CAPS Take 1 capsule by mouth daily.  . fish oil-omega-3 fatty acids 1000 MG capsule Take 1 g by mouth daily.  . furosemide (LASIX) 40 MG tablet Take 40 mg by mouth daily.  Marland Kitchen latanoprost (XALATAN) 0.005 % ophthalmic solution Place 1 drop into both eyes daily. 1 drop in each eye  . metoprolol tartrate (LOPRESSOR) 25 MG tablet Take 25 mg by mouth 2 (two) times daily.  . Multiple Vitamin (MULTI-VITAMIN DAILY PO) Take by mouth.  .  pantoprazole (PROTONIX) 40 MG tablet TAKE 1 TABLET BY MOUTH DAILY  . tamsulosin (FLOMAX) 0.4 MG CAPS capsule Take 1 capsule (0.4 mg total) by mouth daily.  . vitamin C (ASCORBIC ACID) 500 MG tablet Take 500 mg by mouth daily.  Marland Kitchen warfarin (COUMADIN) 5 MG tablet Take 5 mg by mouth at bedtime. As directed  . [DISCONTINUED] hydrochlorothiazide (MICROZIDE) 12.5 MG capsule Take 1 capsule (12.5 mg total) by mouth daily.    Review of Systems  Constitutional: Negative for appetite change and unexpected weight change.  HENT: Negative for congestion and sinus pressure.   Respiratory: Negative for cough, chest tightness and shortness of breath.   Cardiovascular: Negative for chest pain, palpitations and leg swelling.  Gastrointestinal: Negative for nausea, vomiting, abdominal pain and diarrhea.  Neurological: Negative for dizziness, light-headedness and headaches.       Objective:     Blood pressure recheck:  134/72  Physical Exam  Constitutional: He appears well-developed and well-nourished. No distress.  HENT:  Nose: Nose normal.  Mouth/Throat: Oropharynx is clear and moist.  Neck: Neck supple. No thyromegaly present.  Cardiovascular: Normal rate and regular rhythm.   Pulmonary/Chest: Effort normal and breath sounds normal. No respiratory distress.  Abdominal: Soft. Bowel sounds are normal. There is no tenderness.  Musculoskeletal: He exhibits no edema.  Lymphadenopathy:    He has no cervical adenopathy.  Skin: No rash noted. No erythema.    BP 120/60 mmHg  Pulse 64  Temp(Src) 97.6 F (36.4 C) (Oral)  Ht 5' 4.5" (1.638 m)  Wt 153 lb 6 oz (69.57 kg)  BMI 25.93 kg/m2  SpO2 97% Wt Readings from Last 3 Encounters:  05/02/14 153 lb 6 oz (69.57 kg)  03/14/14 158 lb 8 oz (71.895 kg)  12/28/13 153 lb 12 oz (69.741 kg)     Lab Results  Component Value Date   WBC 6.8 05/01/2014   HGB 12.8* 05/01/2014   HCT 38.2* 05/01/2014   PLT 230.0 05/01/2014   GLUCOSE 108* 05/01/2014   CHOL  117 05/01/2014   TRIG 141.0 05/01/2014   HDL 35.30* 05/01/2014   LDLCALC 54 05/01/2014   ALT 16 05/01/2014   AST 21 05/01/2014   NA 138 05/01/2014   K 3.8 05/01/2014   CL 103 05/01/2014   CREATININE 1.00 05/01/2014   BUN 32* 05/01/2014   CO2 27 05/01/2014   TSH 1.99 05/01/2014   INR 2.3* 08/03/2012   HGBA1C 6.2 03/15/2012       Assessment & Plan:   Problem List Items Addressed This Visit    Atrial fibrillation - Primary    Followed by cardiology.  Stable.  On digoxin now.  Coumadin followed by cardiology.       Relevant Medications   digoxin (LANOXIN) 0.125 MG tablet   furosemide (LASIX) 40 MG tablet   CAD (coronary artery disease)    Followed by cardiology.  Stable.        Relevant Medications   digoxin (LANOXIN) 0.125 MG tablet   furosemide (LASIX) 40 MG tablet   Hypercholesteremia    On lipitor.  Low cholesterol diet and exercise.  Follow lipid panel and liver function tests.        Relevant Medications   digoxin (LANOXIN) 0.125 MG tablet   furosemide (LASIX) 40 MG tablet   Hypertension    Blood pressure doing better.  Follow pressures.  Same medication regimen.  Follow metabolic panel.        Relevant Medications   digoxin (LANOXIN) 0.125 MG tablet   furosemide (LASIX) 40 MG tablet   Hypokalemia    05/01/14 - potassium wnl.        Hyponatremia    05/01/14 - sodium wnl.           Tyler Pheasant, MD

## 2014-05-08 NOTE — Assessment & Plan Note (Signed)
05/01/14 - potassium wnl.

## 2014-05-08 NOTE — Assessment & Plan Note (Signed)
Followed by cardiology.  Stable.  On digoxin now.  Coumadin followed by cardiology.

## 2014-05-22 ENCOUNTER — Other Ambulatory Visit: Payer: Self-pay | Admitting: Internal Medicine

## 2014-06-12 ENCOUNTER — Other Ambulatory Visit: Payer: Self-pay | Admitting: Internal Medicine

## 2014-07-04 ENCOUNTER — Telehealth: Payer: Self-pay | Admitting: Internal Medicine

## 2014-07-04 NOTE — Telephone Encounter (Signed)
Spoke with pts wife.  He has appoint with Orthopedics today at 2pm.

## 2014-07-04 NOTE — Telephone Encounter (Signed)
FYI

## 2014-07-04 NOTE — Telephone Encounter (Signed)
Patient Name: HAYLEN SHELNUTT DOB: 08-Jul-1925 Initial Comment Caller states her Dad is having lower left side back pain, sometimes comes around his stomach. Nurse Assessment Nurse: Ronnald Ramp, RN, Miranda Date/Time (Eastern Time): 07/04/2014 8:47:48 AM Confirm and document reason for call. If symptomatic, describe symptoms. ---Caller states her father has been c/o left lower back pain since Thursday. This morning he mention having some pain that radiates to his abdomen. Has the patient traveled out of the country within the last 30 days? ---Not Applicable Does the patient require triage? ---Yes Related visit to physician within the last 2 weeks? ---No Does the PT have any chronic conditions? (i.e. diabetes, asthma, etc.) ---Yes List chronic conditions. ---Spinal stenosis, Prostate, A-fib Guidelines Guideline Title Affirmed Question Affirmed Notes Flank Pain MODERATE pain (e.g., interferes with normal activities or awakens from sleep) Final Disposition User See Physician within 24 Hours Jones, RN, Miranda Comments Caller states that the pt does not want to be seen, he is going to call his orthopedic clinic for further recommendations and will call back if needed.

## 2014-07-04 NOTE — Telephone Encounter (Signed)
Please f/u later and see how doing and if evaluated.  Also let him know I am out of the office and do feel he needs evaluated this week - to confirm etiology.

## 2014-08-08 ENCOUNTER — Ambulatory Visit (INDEPENDENT_AMBULATORY_CARE_PROVIDER_SITE_OTHER): Payer: Medicare Other | Admitting: Internal Medicine

## 2014-08-08 ENCOUNTER — Encounter: Payer: Self-pay | Admitting: Internal Medicine

## 2014-08-08 VITALS — BP 118/70 | HR 58 | Temp 98.0°F | Ht 64.5 in | Wt 155.1 lb

## 2014-08-08 DIAGNOSIS — I2510 Atherosclerotic heart disease of native coronary artery without angina pectoris: Secondary | ICD-10-CM | POA: Diagnosis not present

## 2014-08-08 DIAGNOSIS — I4891 Unspecified atrial fibrillation: Secondary | ICD-10-CM

## 2014-08-08 DIAGNOSIS — D508 Other iron deficiency anemias: Secondary | ICD-10-CM

## 2014-08-08 DIAGNOSIS — Z0000 Encounter for general adult medical examination without abnormal findings: Secondary | ICD-10-CM

## 2014-08-08 DIAGNOSIS — I10 Essential (primary) hypertension: Secondary | ICD-10-CM | POA: Diagnosis not present

## 2014-08-08 DIAGNOSIS — E7800 Pure hypercholesterolemia, unspecified: Secondary | ICD-10-CM

## 2014-08-08 MED ORDER — TAMSULOSIN HCL 0.4 MG PO CAPS: 0.4000 mg | ORAL_CAPSULE | Freq: Every day | ORAL | Status: DC

## 2014-08-08 NOTE — Progress Notes (Signed)
Pre visit review using our clinic review tool, if applicable. No additional management support is needed unless otherwise documented below in the visit note. 

## 2014-08-08 NOTE — Progress Notes (Signed)
[name]: Tyler Stout, male   DOB: [birth year], 81 y.o.   MRN: [id number]   Subjective:    [name]: Tyler Stout, male    DOB: 1925/08/08, 81 y.o.   MRN: [id number]  HPI  Patient here for a scheduled follow up.  He is accompanied by his wife.  History obtained from both of them. He is walking.  Just saw cardiology.  Off digoxin.  Breathing stable.  Eating and drinking well.  Bowels overall stable.  Blood pressure has been doing well - averaging 120-130s/60-80.    Past Medical History  Diagnosis Date  . CAD (coronary artery disease)     s/p bypass graft x 4 (1991)  . GERD (gastroesophageal reflux disease)   . Atrial fibrillation   . Anemia     iron deficiency  . Hypertension   . Hypercholesterolemia   . Pancreatitis     x2 (1994 and 1998), presumed to be gallstone pancreatitis.  s/p ERCP and sphincterotomy  . Bilateral renal cysts     worked up by Dr Bernardo Heater  . Glaucoma   . BPH (benign prostatic hypertrophy)   . Diverticulosis   . Arthritis     Outpatient Encounter Prescriptions as of 08/08/2014  Medication Sig  . atorvastatin (LIPITOR) 20 MG tablet TAKE 1 TABLET (20 MG TOTAL) BY MOUTH DAILY.  . Calcium Carbonate-Vitamin D (CALCIUM 600+D) 600-400 MG-UNIT per tablet Take 1 tablet by mouth daily.  . citalopram (CELEXA) 20 MG tablet TAKE 1 TABLET (20 MG TOTAL) BY MOUTH DAILY.  . Diltiazem HCl Coated Beads (CARDIZEM CD PO) Take 120 mg by mouth daily. As directed  . Fe Fum-FePoly-Vit C-Vit B3 (INTEGRA) 62.5-62.5-40-3 MG CAPS Take 1 capsule by mouth daily.  . fish oil-omega-3 fatty acids 1000 MG capsule Take 1 g by mouth daily.  . furosemide (LASIX) 40 MG tablet Take 40 mg by mouth daily.  Marland Kitchen latanoprost (XALATAN) 0.005 % ophthalmic solution Place 1 drop into both eyes daily. 1 drop in each eye  . metoprolol tartrate (LOPRESSOR) 25 MG tablet Take 25 mg by mouth 2 (two) times daily.  . Multiple Vitamin (MULTI-VITAMIN DAILY PO) Take by mouth.  . pantoprazole (PROTONIX) 40 MG  tablet TAKE 1 TABLET BY MOUTH DAILY  . tamsulosin (FLOMAX) 0.4 MG CAPS capsule Take 1 capsule (0.4 mg total) by mouth daily.  . vitamin C (ASCORBIC ACID) 500 MG tablet Take 500 mg by mouth daily.  Marland Kitchen warfarin (COUMADIN) 2 MG tablet Take 2 mg by mouth daily.  Marland Kitchen warfarin (COUMADIN) 5 MG tablet Take 2.5 mg by mouth at bedtime. As directed  . [DISCONTINUED] tamsulosin (FLOMAX) 0.4 MG CAPS capsule TAKE ONE (1) CAPSULE EACH DAY  . [DISCONTINUED] digoxin (LANOXIN) 0.125 MG tablet Take 125 mcg by mouth daily.   No facility-administered encounter medications on file as of 08/08/2014.    Review of Systems  Constitutional: Negative for appetite change and unexpected weight change.  HENT: Negative for congestion and sinus pressure.   Respiratory: Negative for cough, chest tightness and shortness of breath.   Cardiovascular: Negative for chest pain, palpitations and leg swelling.  Gastrointestinal: Negative for nausea, vomiting, abdominal pain and diarrhea.  Genitourinary: Negative for dysuria and difficulty urinating (no problems urinating with taking flomax.  ).  Skin: Negative for color change and rash.  Neurological: Negative for dizziness, light-headedness and headaches.  Psychiatric/Behavioral: Negative for dysphoric mood and agitation.       Objective:     Blood pressure recheck: 114/62  Physical Exam  Constitutional: He appears well-developed and well-nourished. No distress.  HENT:  Nose: Nose normal.  Mouth/Throat: Oropharynx is clear and moist.  Neck: Neck supple. No thyromegaly present.  Cardiovascular: Normal rate and regular rhythm.   Pulmonary/Chest: Effort normal and breath sounds normal. No respiratory distress.  Abdominal: Soft. Bowel sounds are normal. There is no tenderness.  Musculoskeletal: He exhibits no edema or tenderness.  Lymphadenopathy:    He has no cervical adenopathy.  Skin: No rash noted. No erythema.  Psychiatric: He has a normal mood and affect. His behavior  is normal.    BP 118/70 mmHg  Pulse 58  Temp(Src) 98 F (36.7 C) (Oral)  Ht 5' 4.5" (1.638 m)  Wt 155 lb 2 oz (70.364 kg)  BMI 26.23 kg/m2  SpO2 94% Wt Readings from Last 3 Encounters:  08/08/14 155 lb 2 oz (70.364 kg)  05/02/14 153 lb 6 oz (69.57 kg)  03/14/14 158 lb 8 oz (71.895 kg)     Lab Results  Component Value Date   WBC 6.8 05/01/2014   HGB 12.8* 05/01/2014   HCT 38.2* 05/01/2014   PLT 230.0 05/01/2014   GLUCOSE 108* 05/01/2014   CHOL 117 05/01/2014   TRIG 141.0 05/01/2014   HDL 35.30* 05/01/2014   LDLCALC 54 05/01/2014   ALT 16 05/01/2014   AST 21 05/01/2014   NA 138 05/01/2014   K 3.8 05/01/2014   CL 103 05/01/2014   CREATININE 1.00 05/01/2014   BUN 32* 05/01/2014   CO2 27 05/01/2014   TSH 1.99 05/01/2014   INR 2.3* 08/03/2012   HGBA1C 6.2 03/15/2012       Assessment & Plan:   Problem List Items Addressed This Visit    Anemia    GI w/up as outlined.  Follow cbc.       Atrial fibrillation - Primary    Followed by cardiology.  Off digoxin.  Followed by cardiology.  They are following pt/inr.       Relevant Medications   warfarin (COUMADIN) 2 MG tablet   Other Relevant Orders   CBC with Differential/Platelet   Ferritin   CAD (coronary artery disease)    ECHO 03/30/13 - EF 50-55%, mild left, right atrial enlargement with trace mitral and mild tricuspid insufficiency.  Followed by cardiology.  Stable.       Relevant Medications   warfarin (COUMADIN) 2 MG tablet   Health care maintenance    Physical 12/28/13.  Colonoscopy 05/11/13 - as outlined.  Prostate and psa's followed by Dr Bernardo Heater.       Hypercholesteremia    On crestor.  Low cholesterol diet and exercise.  Follow lipid panel and liver function tests.       Relevant Medications   warfarin (COUMADIN) 2 MG tablet   Other Relevant Orders   Lipid panel   Hepatic function panel   Hypertension    Blood pressure doing well.  Same medication regimen.  Follow pressures.  Follow metabolic  panel.       Relevant Medications   warfarin (COUMADIN) 2 MG tablet   Other Relevant Orders   Basic metabolic panel       Tyler Pheasant, MD

## 2014-08-10 ENCOUNTER — Encounter: Payer: Self-pay | Admitting: Internal Medicine

## 2014-08-10 NOTE — Assessment & Plan Note (Addendum)
ECHO 03/30/13 - EF 50-55%, mild left, right atrial enlargement with trace mitral and mild tricuspid insufficiency.  Followed by cardiology.  Stable.

## 2014-08-10 NOTE — Assessment & Plan Note (Signed)
Followed by cardiology.  Off digoxin.  Followed by cardiology.  They are following pt/inr.

## 2014-08-10 NOTE — Assessment & Plan Note (Signed)
On crestor.  Low cholesterol diet and exercise.  Follow lipid panel and liver function tests.   

## 2014-08-10 NOTE — Assessment & Plan Note (Signed)
GI w/up as outlined.  Follow cbc.

## 2014-08-10 NOTE — Assessment & Plan Note (Signed)
Blood pressure doing well.  Same medication regimen.  Follow pressures.  Follow metabolic panel.   

## 2014-08-11 DIAGNOSIS — Z0000 Encounter for general adult medical examination without abnormal findings: Secondary | ICD-10-CM | POA: Insufficient documentation

## 2014-08-11 NOTE — Assessment & Plan Note (Addendum)
Physical 12/28/13.  Colonoscopy 05/11/13 - as outlined.  Prostate and psa's followed by Dr Bernardo Heater.

## 2014-10-08 ENCOUNTER — Other Ambulatory Visit: Payer: Self-pay | Admitting: Internal Medicine

## 2014-10-24 ENCOUNTER — Other Ambulatory Visit: Payer: Self-pay | Admitting: Internal Medicine

## 2014-11-07 ENCOUNTER — Other Ambulatory Visit (INDEPENDENT_AMBULATORY_CARE_PROVIDER_SITE_OTHER): Payer: Medicare Other

## 2014-11-07 DIAGNOSIS — I4891 Unspecified atrial fibrillation: Secondary | ICD-10-CM

## 2014-11-07 DIAGNOSIS — E7800 Pure hypercholesterolemia, unspecified: Secondary | ICD-10-CM

## 2014-11-07 DIAGNOSIS — I10 Essential (primary) hypertension: Secondary | ICD-10-CM

## 2014-11-07 LAB — HEPATIC FUNCTION PANEL
ALT: 16 U/L (ref 0–53)
AST: 20 U/L (ref 0–37)
Albumin: 4.1 g/dL (ref 3.5–5.2)
Alkaline Phosphatase: 82 U/L (ref 39–117)
BILIRUBIN DIRECT: 0.1 mg/dL (ref 0.0–0.3)
BILIRUBIN TOTAL: 0.6 mg/dL (ref 0.2–1.2)
TOTAL PROTEIN: 6.9 g/dL (ref 6.0–8.3)

## 2014-11-07 LAB — BASIC METABOLIC PANEL
BUN: 23 mg/dL (ref 6–23)
CHLORIDE: 103 meq/L (ref 96–112)
CO2: 30 mEq/L (ref 19–32)
Calcium: 9.4 mg/dL (ref 8.4–10.5)
Creatinine, Ser: 1.01 mg/dL (ref 0.40–1.50)
GFR: 74.21 mL/min (ref >60.00)
Glucose, Bld: 106 mg/dL — ABNORMAL HIGH (ref 70–99)
POTASSIUM: 3.8 meq/L (ref 3.5–5.1)
Sodium: 141 mEq/L (ref 135–145)

## 2014-11-07 LAB — CBC WITH DIFFERENTIAL/PLATELET
BASOS PCT: 0.3 % (ref 0.0–3.0)
Basophils Absolute: 0 10*3/uL (ref 0.0–0.1)
Eosinophils Absolute: 0.2 10*3/uL (ref 0.0–0.7)
Eosinophils Relative: 2.3 % (ref 0.0–5.0)
HEMATOCRIT: 38.8 % — AB (ref 39.0–52.0)
Hemoglobin: 13.1 g/dL (ref 13.0–17.0)
LYMPHS PCT: 16.4 % (ref 12.0–46.0)
Lymphs Abs: 1.4 10*3/uL (ref 0.7–4.0)
MCHC: 33.8 g/dL (ref 30.0–36.0)
MCV: 91.1 fl (ref 78.0–100.0)
MONOS PCT: 10.2 % (ref 3.0–12.0)
Monocytes Absolute: 0.9 10*3/uL (ref 0.1–1.0)
NEUTROS ABS: 6.2 10*3/uL (ref 1.4–7.7)
Neutrophils Relative %: 70.8 % (ref 43.0–77.0)
PLATELETS: 200 10*3/uL (ref 150.0–400.0)
RBC: 4.26 Mil/uL (ref 4.22–5.81)
RDW: 15.8 % — AB (ref 11.5–15.5)
WBC: 8.8 10*3/uL (ref 4.0–10.5)

## 2014-11-07 LAB — LIPID PANEL
CHOL/HDL RATIO: 3
Cholesterol: 126 mg/dL (ref 0–200)
HDL: 40.1 mg/dL (ref >39.00)
LDL Cholesterol: 65 mg/dL (ref 0–99)
NONHDL: 85.62
Triglycerides: 103 mg/dL (ref 0.0–149.0)
VLDL: 20.6 mg/dL (ref 0.0–40.0)

## 2014-11-07 LAB — FERRITIN: Ferritin: 68.3 ng/mL (ref 22.0–322.0)

## 2014-11-08 ENCOUNTER — Ambulatory Visit (INDEPENDENT_AMBULATORY_CARE_PROVIDER_SITE_OTHER): Payer: Medicare Other | Admitting: Internal Medicine

## 2014-11-08 ENCOUNTER — Encounter: Payer: Self-pay | Admitting: Internal Medicine

## 2014-11-08 VITALS — BP 120/70 | HR 63 | Resp 17 | Ht 64.5 in | Wt 152.0 lb

## 2014-11-08 DIAGNOSIS — M545 Low back pain: Secondary | ICD-10-CM

## 2014-11-08 DIAGNOSIS — Z23 Encounter for immunization: Secondary | ICD-10-CM | POA: Diagnosis not present

## 2014-11-08 DIAGNOSIS — E7800 Pure hypercholesterolemia, unspecified: Secondary | ICD-10-CM

## 2014-11-08 DIAGNOSIS — D508 Other iron deficiency anemias: Secondary | ICD-10-CM

## 2014-11-08 DIAGNOSIS — I10 Essential (primary) hypertension: Secondary | ICD-10-CM | POA: Diagnosis not present

## 2014-11-08 DIAGNOSIS — I4891 Unspecified atrial fibrillation: Secondary | ICD-10-CM

## 2014-11-08 DIAGNOSIS — I2510 Atherosclerotic heart disease of native coronary artery without angina pectoris: Secondary | ICD-10-CM

## 2014-11-08 NOTE — Addendum Note (Signed)
Addended by: Leeanne Rio on: 11/08/2014 10:20 AM   Modules accepted: Orders

## 2014-11-08 NOTE — Assessment & Plan Note (Signed)
Cholesterol just checked and wnl.  LDL 65.  Continue current medication regimen.  Follow lipid panel and liver function tests.

## 2014-11-08 NOTE — Assessment & Plan Note (Signed)
Blood pressure doing well.  Same medication regimen.  Follow pressures.  Follow metabolic panel.   

## 2014-11-08 NOTE — Progress Notes (Signed)
Pre-visit discussion using our clinic review tool. No additional management support is needed unless otherwise documented below in the visit note.  

## 2014-11-08 NOTE — Assessment & Plan Note (Signed)
No increased heart rate or palpitations.  Followed by cardiology.  On coumadin.  PT/INR followed by cardiology.

## 2014-11-08 NOTE — Assessment & Plan Note (Signed)
Has seen ortho.  Flares intermittently.  Stable.

## 2014-11-08 NOTE — Progress Notes (Signed)
[name]: Tyler Stout, male   DOB: 1925-06-14, 81 y.o.   MRN: [id number]   Subjective:    [name]: Tyler Stout, male    DOB: [birth year], 81 y.o.   MRN: [id number]  HPI  Patient with past history of CAD, GERD, atrial fib, anemia, hypertension and hypercholesterolemia who comes in today to follow up on these issues.  He is accompanied by his wife.  History obtained from both of them.  He tries to stay active.  No chest pain or tightness.  No sob.  Breathing stable.  No increased heart rate or palpitations.  On coumadin.  PT/INR followed by cardiology.  No abdominal pain or cramping.  Bowels stable.  Some back flares at times.  Overall stable.     Past Medical History  Diagnosis Date  . CAD (coronary artery disease)     s/p bypass graft x 4 (1991)  . GERD (gastroesophageal reflux disease)   . Atrial fibrillation ([place])   . Anemia     iron deficiency  . Hypertension   . Hypercholesterolemia   . Pancreatitis     x2 (1994 and 1998), presumed to be gallstone pancreatitis.  s/p ERCP and sphincterotomy  . Bilateral renal cysts     worked up by Dr Bernardo Heater  . Glaucoma   . BPH (benign prostatic hypertrophy)   . Diverticulosis   . Arthritis    Past Surgical History  Procedure Laterality Date  . Appendectomy    . Coronary artery bypass graft      x 4,  1991  . Cholecystectomy  1994  . Inguinal hernia repair  2009  . Open heart surgery  1991   Family History  Problem Relation Age of Onset  . Throat cancer Father     oropharyngeal cancer  . Kidney disease Sister   . Heart disease    . Colon cancer Neg Hx   . Prostate cancer Neg Hx    Social History   Social History  . Marital Status: Married    Spouse Name: N/A  . Number of Children: N/A  . Years of Education: N/A   Social History Main Topics  . Smoking status: Former Smoker    Quit date: 01/14/1964  . Smokeless tobacco: Never Used  . Alcohol Use: No  . Drug Use: No  . Sexual Activity: Not Asked   Other Topics  Concern  . None   Social History Narrative    Outpatient Encounter Prescriptions as of 11/08/2014  Medication Sig  . atorvastatin (LIPITOR) 20 MG tablet TAKE 1 TABLET (20 MG TOTAL) BY MOUTH DAILY.  . Calcium Carbonate-Vitamin D (CALCIUM 600+D) 600-400 MG-UNIT per tablet Take 1 tablet by mouth daily.  . citalopram (CELEXA) 20 MG tablet TAKE 1 TABLET (20 MG TOTAL) BY MOUTH DAILY.  . Diltiazem HCl Coated Beads (CARDIZEM CD PO) Take 120 mg by mouth daily. As directed  . Fe Fum-FePoly-Vit C-Vit B3 (INTEGRA) 62.5-62.5-40-3 MG CAPS Take 1 capsule by mouth daily.  . fish oil-omega-3 fatty acids 1000 MG capsule Take 1 g by mouth daily.  . furosemide (LASIX) 40 MG tablet Take 40 mg by mouth daily.  Marland Kitchen latanoprost (XALATAN) 0.005 % ophthalmic solution Place 1 drop into both eyes daily. 1 drop in each eye  . metoprolol tartrate (LOPRESSOR) 25 MG tablet Take 25 mg by mouth 2 (two) times daily.  . Multiple Vitamin (MULTI-VITAMIN DAILY PO) Take by mouth.  . pantoprazole (PROTONIX) 40 MG tablet TAKE 1 TABLET BY  MOUTH DAILY  . tamsulosin (FLOMAX) 0.4 MG CAPS capsule Take 1 capsule (0.4 mg total) by mouth daily.  . vitamin C (ASCORBIC ACID) 500 MG tablet Take 500 mg by mouth daily.  Marland Kitchen warfarin (COUMADIN) 2 MG tablet Take 2 mg by mouth daily.  Marland Kitchen warfarin (COUMADIN) 5 MG tablet Take 2.5 mg by mouth at bedtime. As directed   No facility-administered encounter medications on file as of 11/08/2014.    Review of Systems  Constitutional: Negative for fever and appetite change.  HENT: Negative for congestion and sinus pressure.   Respiratory: Negative for cough, chest tightness and shortness of breath.   Cardiovascular: Negative for chest pain, palpitations and leg swelling.  Gastrointestinal: Negative for nausea, vomiting, abdominal pain and diarrhea.  Musculoskeletal: Positive for back pain (intermittent flares.  stable.  ). Negative for joint swelling.  Neurological: Negative for dizziness,  light-headedness and headaches.  Psychiatric/Behavioral: Negative for dysphoric mood and agitation.       Objective:     Blood pressure rechecked by me:  128/68  Physical Exam  Constitutional: He appears well-developed and well-nourished. No distress.  HENT:  Nose: Nose normal.  Mouth/Throat: Oropharynx is clear and moist.  Eyes: Conjunctivae are normal. Right eye exhibits no discharge. Left eye exhibits no discharge.  Neck: Neck supple. No thyromegaly present.  Neck fullness - right.  Stable and unchanged.    Cardiovascular: Normal rate.   Rate controlled.   Pulmonary/Chest: Effort normal and breath sounds normal. No respiratory distress.  Abdominal: Soft. Bowel sounds are normal. There is no tenderness.  Musculoskeletal: He exhibits no edema or tenderness.  Compression hose in place.   Lymphadenopathy:    He has no cervical adenopathy.  Skin: No rash noted. No erythema.  Psychiatric: He has a normal mood and affect. His behavior is normal.    BP 120/70 mmHg  Pulse 63  Temp(Src)   Resp 17  Ht 5' 4.5" (1.638 m)  Wt 152 lb (68.947 kg)  BMI 25.70 kg/m2  SpO2 99% Wt Readings from Last 3 Encounters:  11/08/14 152 lb (68.947 kg)  08/08/14 155 lb 2 oz (70.364 kg)  05/02/14 153 lb 6 oz (69.57 kg)     Lab Results  Component Value Date   WBC 8.8 11/07/2014   HGB 13.1 11/07/2014   HCT 38.8* 11/07/2014   PLT 200.0 11/07/2014   GLUCOSE 106* 11/07/2014   CHOL 126 11/07/2014   TRIG 103.0 11/07/2014   HDL 40.10 11/07/2014   LDLCALC 65 11/07/2014   ALT 16 11/07/2014   AST 20 11/07/2014   NA 141 11/07/2014   K 3.8 11/07/2014   CL 103 11/07/2014   CREATININE 1.01 11/07/2014   BUN 23 11/07/2014   CO2 30 11/07/2014   TSH 1.99 05/01/2014   INR 2.3* 08/03/2012   HGBA1C 6.2 03/15/2012       Assessment & Plan:   Problem List Items Addressed This Visit    Anemia    EGD and colonoscopy 05/11/13 (outlined in overview).  Recent hgb wnl.  Follow.       Atrial  fibrillation ([place]) - Primary    No increased heart rate or palpitations.  Followed by cardiology.  On coumadin.  PT/INR followed by cardiology.        Back pain    Has seen ortho.  Flares intermittently.  Stable.       CAD (coronary artery disease)    Followed by cardiology.  Stable.  Continue risk factor modification.  Hypercholesteremia    Cholesterol just checked and wnl.  LDL 65.  Continue current medication regimen.  Follow lipid panel and liver function tests.       Hypertension    Blood pressure doing well.  Same medication regimen.  Follow pressures.  Follow metabolic panel.            [name], MD

## 2014-11-08 NOTE — Assessment & Plan Note (Signed)
EGD and colonoscopy 05/11/13 (outlined in overview).  Recent hgb wnl.  Follow.

## 2014-11-08 NOTE — Assessment & Plan Note (Signed)
Followed by cardiology.  Stable.  Continue risk factor modification.   

## 2014-11-19 ENCOUNTER — Other Ambulatory Visit: Payer: Self-pay | Admitting: Internal Medicine

## 2014-12-03 ENCOUNTER — Other Ambulatory Visit: Payer: Self-pay | Admitting: Internal Medicine

## 2015-01-31 DIAGNOSIS — I480 Paroxysmal atrial fibrillation: Secondary | ICD-10-CM | POA: Diagnosis not present

## 2015-02-26 DIAGNOSIS — H402232 Chronic angle-closure glaucoma, bilateral, moderate stage: Secondary | ICD-10-CM | POA: Diagnosis not present

## 2015-02-28 DIAGNOSIS — I480 Paroxysmal atrial fibrillation: Secondary | ICD-10-CM | POA: Diagnosis not present

## 2015-03-06 ENCOUNTER — Inpatient Hospital Stay
Admission: EM | Admit: 2015-03-06 | Discharge: 2015-03-07 | DRG: 871 | Disposition: A | Discharge status: Home / Self Care | Payer: Medicare HMO | Attending: Internal Medicine | Admitting: Internal Medicine

## 2015-03-06 ENCOUNTER — Emergency Department: Payer: Medicare HMO

## 2015-03-06 ENCOUNTER — Encounter: Payer: Self-pay | Admitting: Emergency Medicine

## 2015-03-06 ENCOUNTER — Telehealth: Payer: Self-pay

## 2015-03-06 DIAGNOSIS — H409 Unspecified glaucoma: Secondary | ICD-10-CM | POA: Diagnosis not present

## 2015-03-06 DIAGNOSIS — Z7901 Long term (current) use of anticoagulants: Secondary | ICD-10-CM | POA: Diagnosis not present

## 2015-03-06 DIAGNOSIS — Z800 Family history of malignant neoplasm of digestive organs: Secondary | ICD-10-CM | POA: Diagnosis not present

## 2015-03-06 DIAGNOSIS — E785 Hyperlipidemia, unspecified: Secondary | ICD-10-CM | POA: Diagnosis present

## 2015-03-06 DIAGNOSIS — I119 Hypertensive heart disease without heart failure: Secondary | ICD-10-CM | POA: Diagnosis present

## 2015-03-06 DIAGNOSIS — R509 Fever, unspecified: Secondary | ICD-10-CM | POA: Diagnosis not present

## 2015-03-06 DIAGNOSIS — Z87891 Personal history of nicotine dependence: Secondary | ICD-10-CM

## 2015-03-06 DIAGNOSIS — I4892 Unspecified atrial flutter: Secondary | ICD-10-CM | POA: Diagnosis not present

## 2015-03-06 DIAGNOSIS — K219 Gastro-esophageal reflux disease without esophagitis: Secondary | ICD-10-CM | POA: Diagnosis not present

## 2015-03-06 DIAGNOSIS — I4891 Unspecified atrial fibrillation: Secondary | ICD-10-CM | POA: Diagnosis not present

## 2015-03-06 DIAGNOSIS — Z888 Allergy status to other drugs, medicaments and biological substances status: Secondary | ICD-10-CM | POA: Diagnosis not present

## 2015-03-06 DIAGNOSIS — J189 Pneumonia, unspecified organism: Secondary | ICD-10-CM | POA: Diagnosis present

## 2015-03-06 DIAGNOSIS — Z808 Family history of malignant neoplasm of other organs or systems: Secondary | ICD-10-CM

## 2015-03-06 DIAGNOSIS — Z9049 Acquired absence of other specified parts of digestive tract: Secondary | ICD-10-CM | POA: Diagnosis not present

## 2015-03-06 DIAGNOSIS — Z951 Presence of aortocoronary bypass graft: Secondary | ICD-10-CM | POA: Diagnosis not present

## 2015-03-06 DIAGNOSIS — Z880 Allergy status to penicillin: Secondary | ICD-10-CM | POA: Diagnosis not present

## 2015-03-06 DIAGNOSIS — Z9889 Other specified postprocedural states: Secondary | ICD-10-CM | POA: Diagnosis not present

## 2015-03-06 DIAGNOSIS — Z841 Family history of disorders of kidney and ureter: Secondary | ICD-10-CM | POA: Diagnosis not present

## 2015-03-06 DIAGNOSIS — R6510 Systemic inflammatory response syndrome (SIRS) of non-infectious origin without acute organ dysfunction: Secondary | ICD-10-CM

## 2015-03-06 DIAGNOSIS — R0682 Tachypnea, not elsewhere classified: Secondary | ICD-10-CM | POA: Diagnosis present

## 2015-03-06 DIAGNOSIS — I2510 Atherosclerotic heart disease of native coronary artery without angina pectoris: Secondary | ICD-10-CM | POA: Diagnosis present

## 2015-03-06 DIAGNOSIS — R059 Cough, unspecified: Secondary | ICD-10-CM

## 2015-03-06 DIAGNOSIS — Z79899 Other long term (current) drug therapy: Secondary | ICD-10-CM | POA: Diagnosis not present

## 2015-03-06 DIAGNOSIS — R05 Cough: Secondary | ICD-10-CM | POA: Diagnosis not present

## 2015-03-06 DIAGNOSIS — A419 Sepsis, unspecified organism: Secondary | ICD-10-CM | POA: Diagnosis not present

## 2015-03-06 DIAGNOSIS — N400 Enlarged prostate without lower urinary tract symptoms: Secondary | ICD-10-CM | POA: Diagnosis not present

## 2015-03-06 DIAGNOSIS — R0602 Shortness of breath: Secondary | ICD-10-CM | POA: Diagnosis not present

## 2015-03-06 DIAGNOSIS — I10 Essential (primary) hypertension: Secondary | ICD-10-CM | POA: Diagnosis not present

## 2015-03-06 DIAGNOSIS — S0990XA Unspecified injury of head, initial encounter: Secondary | ICD-10-CM | POA: Diagnosis not present

## 2015-03-06 LAB — CBC
HEMATOCRIT: 37.4 % — AB (ref 40.0–52.0)
HEMOGLOBIN: 12.5 g/dL — AB (ref 13.0–18.0)
MCH: 31.1 pg (ref 26.0–34.0)
MCHC: 33.5 g/dL (ref 32.0–36.0)
MCV: 92.9 fL (ref 80.0–100.0)
Platelets: 199 10*3/uL (ref 150–440)
RBC: 4.03 MIL/uL — ABNORMAL LOW (ref 4.40–5.90)
RDW: 14.6 % — ABNORMAL HIGH (ref 11.5–14.5)
WBC: 6.9 10*3/uL (ref 3.8–10.6)

## 2015-03-06 LAB — URINALYSIS COMPLETE WITH MICROSCOPIC (ARMC ONLY)
BILIRUBIN URINE: NEGATIVE
Bacteria, UA: NONE SEEN
GLUCOSE, UA: NEGATIVE mg/dL
Ketones, ur: NEGATIVE mg/dL
LEUKOCYTES UA: NEGATIVE
Nitrite: NEGATIVE
Protein, ur: NEGATIVE mg/dL
SQUAMOUS EPITHELIAL / LPF: NONE SEEN
Specific Gravity, Urine: 1.012 (ref 1.005–1.030)
pH: 5 (ref 5.0–8.0)

## 2015-03-06 LAB — PROTIME-INR
INR: 2.22
PROTHROMBIN TIME: 24.4 s — AB (ref 11.4–15.0)

## 2015-03-06 LAB — BASIC METABOLIC PANEL
Anion gap: 7 (ref 5–15)
BUN: 21 mg/dL — AB (ref 6–20)
CHLORIDE: 103 mmol/L (ref 101–111)
CO2: 26 mmol/L (ref 22–32)
Calcium: 8.6 mg/dL — ABNORMAL LOW (ref 8.9–10.3)
Creatinine, Ser: 0.98 mg/dL (ref 0.61–1.24)
GFR calc Af Amer: >60 mL/min (ref >60)
GFR calc non Af Amer: >60 mL/min (ref >60)
Glucose, Bld: 129 mg/dL — ABNORMAL HIGH (ref 65–99)
POTASSIUM: 3.8 mmol/L (ref 3.5–5.1)
SODIUM: 136 mmol/L (ref 135–145)

## 2015-03-06 LAB — RAPID INFLUENZA A&B ANTIGENS: Influenza A (ARMC): NOT DETECTED

## 2015-03-06 LAB — APTT: APTT: 53 s — AB (ref 24–36)

## 2015-03-06 LAB — LACTIC ACID, PLASMA: LACTIC ACID, VENOUS: 1.7 mmol/L (ref 0.5–2.0)

## 2015-03-06 LAB — TROPONIN I

## 2015-03-06 LAB — RAPID INFLUENZA A&B ANTIGENS (ARMC ONLY): INFLUENZA B (ARMC): NOT DETECTED

## 2015-03-06 MED ORDER — ACETAMINOPHEN 500 MG PO TABS
1000.0000 mg | ORAL_TABLET | Freq: Two times a day (BID) | ORAL | Status: DC
  Administered 2015-03-06: 1000 mg via ORAL
  Filled 2015-03-06: qty 2

## 2015-03-06 MED ORDER — SODIUM CHLORIDE 0.9 % IV BOLUS (SEPSIS)
1000.0000 mL | Freq: Once | INTRAVENOUS | Status: AC
  Administered 2015-03-06: 1000 mL via INTRAVENOUS

## 2015-03-06 MED ORDER — ALUM & MAG HYDROXIDE-SIMETH 200-200-20 MG/5ML PO SUSP: 30.0000 mL | Freq: Four times a day (QID) | ORAL | Status: DC | PRN

## 2015-03-06 MED ORDER — FUROSEMIDE 20 MG PO TABS
20.0000 mg | ORAL_TABLET | Freq: Every day | ORAL | Status: DC
  Administered 2015-03-06: 20 mg via ORAL
  Filled 2015-03-06: qty 1

## 2015-03-06 MED ORDER — ADULT MULTIVITAMIN W/MINERALS CH
1.0000 | ORAL_TABLET | Freq: Every day | ORAL | Status: DC
  Administered 2015-03-06: 1 via ORAL
  Filled 2015-03-06: qty 1

## 2015-03-06 MED ORDER — ONDANSETRON HCL 4 MG PO TABS: 4.0000 mg | ORAL_TABLET | Freq: Four times a day (QID) | ORAL | Status: DC | PRN

## 2015-03-06 MED ORDER — SODIUM CHLORIDE 0.9 % IV SOLN: INTRAVENOUS | Status: DC

## 2015-03-06 MED ORDER — ACETAMINOPHEN 650 MG RE SUPP: 650.0000 mg | Freq: Four times a day (QID) | RECTAL | Status: DC | PRN

## 2015-03-06 MED ORDER — VITAMIN C 500 MG PO TABS: 500.0000 mg | ORAL_TABLET | Freq: Every day | ORAL | Status: DC

## 2015-03-06 MED ORDER — METOPROLOL TARTRATE 25 MG PO TABS
25.0000 mg | ORAL_TABLET | Freq: Two times a day (BID) | ORAL | Status: DC
  Administered 2015-03-06: 25 mg via ORAL
  Filled 2015-03-06: qty 1

## 2015-03-06 MED ORDER — TAMSULOSIN HCL 0.4 MG PO CAPS
0.4000 mg | ORAL_CAPSULE | Freq: Every day | ORAL | Status: DC
  Administered 2015-03-06: 0.4 mg via ORAL
  Filled 2015-03-06: qty 1

## 2015-03-06 MED ORDER — AZTREONAM 2 G IJ SOLR
2.0000 g | Freq: Once | INTRAMUSCULAR | Status: DC
  Filled 2015-03-06: qty 2

## 2015-03-06 MED ORDER — ATORVASTATIN CALCIUM 20 MG PO TABS
20.0000 mg | ORAL_TABLET | Freq: Every day | ORAL | Status: DC
  Administered 2015-03-06: 20 mg via ORAL
  Filled 2015-03-06: qty 1

## 2015-03-06 MED ORDER — LEVOFLOXACIN IN D5W 750 MG/150ML IV SOLN
750.0000 mg | Freq: Once | INTRAVENOUS | Status: AC
  Administered 2015-03-06: 750 mg via INTRAVENOUS
  Filled 2015-03-06: qty 150

## 2015-03-06 MED ORDER — SODIUM CHLORIDE 0.9% FLUSH
3.0000 mL | Freq: Two times a day (BID) | INTRAVENOUS | Status: DC
  Administered 2015-03-06: 3 mL via INTRAVENOUS

## 2015-03-06 MED ORDER — WARFARIN SODIUM 3 MG PO TABS
4.5000 mg | ORAL_TABLET | Freq: Every day | ORAL | Status: DC
  Administered 2015-03-06: 4.5 mg via ORAL
  Filled 2015-03-06: qty 2

## 2015-03-06 MED ORDER — WARFARIN SODIUM 2 MG PO TABS: 2.5000 mg | ORAL_TABLET | Freq: Every evening | ORAL | Status: DC

## 2015-03-06 MED ORDER — LATANOPROST 0.005 % OP SOLN
1.0000 [drp] | Freq: Every day | OPHTHALMIC | Status: DC
  Filled 2015-03-06: qty 2.5

## 2015-03-06 MED ORDER — SODIUM CHLORIDE 0.9 % IV BOLUS (SEPSIS): 500.0000 mL | Freq: Once | INTRAVENOUS | Status: DC

## 2015-03-06 MED ORDER — SENNOSIDES-DOCUSATE SODIUM 8.6-50 MG PO TABS
1.0000 | ORAL_TABLET | Freq: Every evening | ORAL | Status: DC | PRN
Start: 2015-03-06 — End: 2015-03-07

## 2015-03-06 MED ORDER — LEVOFLOXACIN IN D5W 750 MG/150ML IV SOLN: 750.0000 mg | INTRAVENOUS | Status: DC

## 2015-03-06 MED ORDER — SODIUM CHLORIDE 0.9 % IV SOLN
INTRAVENOUS | Status: DC
  Administered 2015-03-06: 22:00:00 via INTRAVENOUS

## 2015-03-06 MED ORDER — ACETAMINOPHEN 325 MG PO TABS: 650.0000 mg | ORAL_TABLET | Freq: Four times a day (QID) | ORAL | Status: DC | PRN

## 2015-03-06 MED ORDER — WARFARIN SODIUM 2 MG PO TABS: 2.0000 mg | ORAL_TABLET | Freq: Every evening | ORAL | Status: DC

## 2015-03-06 MED ORDER — CALCIUM CARBONATE-VITAMIN D 500-200 MG-UNIT PO TABS
1.0000 | ORAL_TABLET | Freq: Every day | ORAL | Status: DC
  Administered 2015-03-06: 1 via ORAL
  Filled 2015-03-06: qty 1

## 2015-03-06 MED ORDER — PANTOPRAZOLE SODIUM 40 MG PO TBEC
40.0000 mg | DELAYED_RELEASE_TABLET | Freq: Every day | ORAL | Status: DC
  Administered 2015-03-06: 40 mg via ORAL
  Filled 2015-03-06: qty 1

## 2015-03-06 MED ORDER — DILTIAZEM HCL ER COATED BEADS 240 MG PO CP24
240.0000 mg | ORAL_CAPSULE | Freq: Every day | ORAL | Status: DC
  Administered 2015-03-06: 240 mg via ORAL
  Filled 2015-03-06: qty 1

## 2015-03-06 MED ORDER — AZTREONAM 2 G IJ SOLR
2.0000 g | Freq: Once | INTRAMUSCULAR | Status: AC
  Administered 2015-03-06: 2 g via INTRAVENOUS
  Filled 2015-03-06: qty 2

## 2015-03-06 MED ORDER — ONDANSETRON HCL 4 MG/2ML IJ SOLN: 4.0000 mg | Freq: Four times a day (QID) | INTRAMUSCULAR | Status: DC | PRN

## 2015-03-06 MED ORDER — HYDROCODONE-ACETAMINOPHEN 5-325 MG PO TABS: 1.0000 | ORAL_TABLET | ORAL | Status: DC | PRN

## 2015-03-06 MED ORDER — VANCOMYCIN HCL IN DEXTROSE 1-5 GM/200ML-% IV SOLN
1000.0000 mg | Freq: Once | INTRAVENOUS | Status: AC
  Administered 2015-03-06: 1000 mg via INTRAVENOUS
  Filled 2015-03-06: qty 200

## 2015-03-06 MED ORDER — METOPROLOL TARTRATE 25 MG PO TABS
25.0000 mg | ORAL_TABLET | Freq: Once | ORAL | Status: AC
  Administered 2015-03-06: 25 mg via ORAL
  Filled 2015-03-06: qty 1

## 2015-03-06 MED ORDER — ENOXAPARIN SODIUM 40 MG/0.4ML ~~LOC~~ SOLN: 40.0000 mg | SUBCUTANEOUS | Status: DC

## 2015-03-06 MED ORDER — WARFARIN - PHARMACIST DOSING INPATIENT: Freq: Every day | Status: DC

## 2015-03-06 NOTE — H&P (Signed)
[facility] NAME: Tyler Stout    MR#:  [id number]  DATE OF BIRTH:  04-18-1925  DATE OF ADMISSION:  03/06/2015  PRIMARY CARE PHYSICIAN: [name], MD   REQUESTING/REFERRING PHYSICIAN: Dr. Edd Fabian  CHIEF COMPLAINT:  Fever HISTORY OF PRESENT ILLNESS:  [name]  is a 82 y.o. male with a known history of atrial fibrillation on chronic anticoagulation, CAD and essential hypertension who presents with above complaint. Patient reports and Saturdays had cough, congestion and fever. He also has had weakness due to these issues. Temperature was 102 this morning at home and so he was brought in by family for further evaluation. In the ER chest x-ray was negative. He had a fall on Friday which was a mechanical fall. CT the head was negative for ICH.  PAST MEDICAL HISTORY:   Past Medical History  Diagnosis Date  . CAD (coronary artery disease)     s/p bypass graft x 4 (1991)  . GERD (gastroesophageal reflux disease)   . Atrial fibrillation ([place])   . Anemia     iron deficiency  . Hypertension   . Hypercholesterolemia   . Pancreatitis     x2 (1994 and 1998), presumed to be gallstone pancreatitis.  s/p ERCP and sphincterotomy  . Bilateral renal cysts     worked up by Dr Bernardo Heater  . Glaucoma   . BPH (benign prostatic hypertrophy)   . Diverticulosis   . Arthritis     PAST SURGICAL HISTORY:   Past Surgical History  Procedure Laterality Date  . Appendectomy    . Coronary artery bypass graft      x 4,  1991  . Cholecystectomy  1994  . Inguinal hernia repair  2009  . Open heart surgery  1991    SOCIAL HISTORY:   Social History  Substance Use Topics  . Smoking status: Former Smoker    Quit date: 01/14/1964  . Smokeless tobacco: Never Used  . Alcohol Use: No    FAMILY HISTORY:   Family History  Problem Relation Age of Onset  . Throat cancer Father     oropharyngeal cancer  . Kidney disease Sister   . Heart  disease    . Colon cancer Neg Hx   . Prostate cancer Neg Hx     DRUG ALLERGIES:   Allergies  Allergen Reactions  . Ilosone [Erythromycin] Other (See Comments)    Reaction:  GI upset   . Penicillins Rash and Other (See Comments)    Has patient had a PCN reaction causing immediate rash, facial/tongue/throat swelling, SOB or lightheadedness with hypotension: No Has patient had a PCN reaction causing severe rash involving mucus membranes or skin necrosis: No Has patient had a PCN reaction that required hospitalization No Has patient had a PCN reaction occurring within the last 10 years: No If all of the above answers are "NO", then may proceed with Cephalosporin use.     REVIEW OF SYSTEMS:  CONSTITUTIONAL: ++fever, fatigue and generalized weakness. [place] headache EYES: No blurred or double vision.  EARS, NOSE, AND THROAT: No tinnitus or ear pain.  RESPIRATORY: Positive cough, no shortness of breath, wheezing or hemoptysis. [place] congestion CARDIOVASCULAR: No chest pain, orthopnea, edema.  GASTROINTESTINAL: No nausea, vomiting, diarrhea or abdominal pain.  GENITOURINARY: No dysuria, hematuria.  ENDOCRINE: No polyuria, nocturia,  HEMATOLOGY: No anemia, easy bruising or bleeding SKIN: No rash or lesion. MUSCULOSKELETAL: Final stenosis and arthritis.   NEUROLOGIC: No tingling, numbness,  weakness. No neck stiffness  PSYCHIATRY: No anxiety or depression.   MEDICATIONS AT HOME:   Prior to Admission medications   Medication Sig Start Date End Date Taking? Authorizing Provider  acetaminophen (TYLENOL) 500 MG tablet Take 1,000 mg by mouth 2 (two) times daily.   Yes Historical Provider, MD  atorvastatin (LIPITOR) 20 MG tablet Take 20 mg by mouth at bedtime.   Yes Historical Provider, MD  Calcium Carbonate-Vitamin D (CALCIUM 600+D) 600-400 MG-UNIT per tablet Take 1 tablet by mouth daily.   Yes Historical Provider, MD  diltiazem (CARDIZEM CD) 240 MG 24 hr capsule Take 240 mg by mouth at  bedtime.   Yes Historical Provider, MD  furosemide (LASIX) 20 MG tablet Take 20 mg by mouth daily.   Yes Historical Provider, MD  latanoprost (XALATAN) 0.005 % ophthalmic solution Place 1 drop into both eyes at bedtime.    Yes Historical Provider, MD  metoprolol tartrate (LOPRESSOR) 25 MG tablet Take 25 mg by mouth 2 (two) times daily.   Yes Historical Provider, MD  Multiple Vitamin (MULTIVITAMIN WITH MINERALS) TABS tablet Take 1 tablet by mouth daily.   Yes Historical Provider, MD  Omega-3 Fatty Acids (FISH OIL) 1000 MG CAPS Take 1,000 mg by mouth daily.   Yes Historical Provider, MD  pantoprazole (PROTONIX) 40 MG tablet Take 40 mg by mouth daily.   Yes Historical Provider, MD  tamsulosin (FLOMAX) 0.4 MG CAPS capsule Take 0.4 mg by mouth at bedtime.   Yes Historical Provider, MD  vitamin C (ASCORBIC ACID) 500 MG tablet Take 500 mg by mouth daily.   Yes Historical Provider, MD  warfarin (COUMADIN) 2 MG tablet Take 2 mg by mouth every evening. Pt takes with one-half of a 5mg  tablet.   Yes Historical Provider, MD  warfarin (COUMADIN) 5 MG tablet Take 2.5 mg by mouth every evening. Pt takes with a 2mg  tablet.   Yes Historical Provider, MD      VITAL SIGNS:  Blood pressure 158/65, pulse 113, temperature 98 F (36.7 C), temperature source Oral, resp. rate 22, height 5\' 6"  (1.676 m), weight 68.947 kg (152 lb), SpO2 95 %.  PHYSICAL EXAMINATION:  GENERAL:  82 y.o.-year-old patient lying in the bed with no acute distress.  EYES: Pupils equal, round, reactive to light and accommodation. No scleral icterus. Extraocular muscles intact.  HEENT: Head atraumatic, normocephalic. Oropharynx and nasopharynx clear. No sinus tenderness NECK:  Supple, no jugular venous distention. No thyroid enlargement, no tenderness.  LUNGS: Normal breath sounds bilaterally, no wheezing, rales,rhonchi or crepitation. No use of accessory muscles of respiration.  CARDIOVASCULAR: S1, S2 normal. No murmurs, rubs, or gallops.   ABDOMEN: Soft, nontender, nondistended. Bowel sounds present. No organomegaly or mass.  EXTREMITIES: No pedal edema, cyanosis, or clubbing.  NEUROLOGIC: Cranial nerves II through XII are grossly intact. No focal deficits. PSYCHIATRIC: The patient is alert and oriented x 3.  SKIN: No obvious rash, lesion, or ulcer.   LABORATORY PANEL:   CBC  Recent Labs Lab 03/06/15 1526  WBC 6.9  HGB 12.5*  HCT 37.4*  PLT 199   ------------------------------------------------------------------------------------------------------------------  Chemistries   Recent Labs Lab 03/06/15 1526  NA 136  K 3.8  CL 103  CO2 26  GLUCOSE 129*  BUN 21*  CREATININE 0.98  CALCIUM 8.6*   ------------------------------------------------------------------------------------------------------------------  Cardiac Enzymes  Recent Labs Lab 03/06/15 1526  TROPONINI <0.03   ------------------------------------------------------------------------------------------------------------------  RADIOLOGY:  Dg Chest 2 View  03/06/2015  CLINICAL DATA:  Cough, congestion, fever, shortness of  Breath EXAM: CHEST  2 VIEW COMPARISON:  10/13/2011 FINDINGS: Prior CABG. Cardiomegaly. There is hyperinflation of the lungs compatible with COPD. No confluent airspace opacities or effusions. No acute bony abnormality. IMPRESSION: Cardiomegaly, COPD.  No active disease. Electronically Signed   By: Rolm Baptise M.D.   On: 03/06/2015 15:50   Ct Head Wo Contrast  03/06/2015  CLINICAL DATA:  Struck left face falling out of bed Friday night. Fever and congestion. EXAM: CT HEAD WITHOUT CONTRAST TECHNIQUE: Contiguous axial images were obtained from the base of the skull through the vertex without intravenous contrast. COMPARISON:  10/25/2012 FINDINGS: The brainstem, cerebellum, cerebral peduncles, thalami, basal ganglia, basilar cisterns, and ventricular system appear within normal limits. Periventricular white matter and corona radiata  hypodensities favor chronic ischemic microvascular white matter disease. Small lacunar infarcts the left lentiform nucleus, stable. Age-appropriate cerebral atrophy. Upper dental prosthesis noted. Chronic bilateral maxillary sinusitis. Mild chronic left sphenoid sinusitis. IMPRESSION: 1. No acute intracranial findings. 2. Remote small lacunar infarcts in the left lentiform nucleus. Periventricular white matter and corona radiata hypodensities favor chronic ischemic microvascular white matter disease. 3. Mild chronic bilateral maxillary sinusitis and mild chronic left sphenoid sinusitis. Electronically Signed   By: Van Clines M.D.   On: 03/06/2015 18:15    EKG:    Atrial flutter heart rate 94  IMPRESSION AND PLAN:   This is an 82 year old male with a history of atrial fibrillation on anticoagulation and essential hypertension who presents with sepsis.  1. Sepsis: Patient presents with tachypnea and tachycardia. He had a fever of 102 at home. Etiology is unclear at this time. Urine analysis and chest x-ray are essentially normal. Influenza A and B are negative as well. I'm suspecting this is likely due to a viral etiology. I will repeat a chest x-ray in the morning to assure the patient does not have a pneumonia not yet seen on chest x-ray in the emergency room. I will cover empirically with Levaquin. He was treated for sepsis in the emergency room with broad-spectrum antibiotics including aztreonam and vancomycin which at this time I do not feel the patient needs. Follow up on blood cultures.  2. Atrial fibrillation/flutter: INR is therapeutic. Pharmacy to assist with Coumadin. Continue metoprolol and diltiazem.   3. Essential hypertension: Continue diltiazem and metoprolol.  4. BPH: Continue tamsulosin.  5. Hyperlipidemia: Continue atorvastatin next  6. Glaucoma: Continue eyedrops.   All the records are reviewed and case discussed with ED provider. Management plans discussed with  the patient and he is in agreement.  CODE STATUS: FULL  TOTAL TIME TAKING CARE OF THIS PATIENT: 50 minutes.    Cable Fearn M.D on 03/06/2015 at 7:34 PM  Between 7am to 6pm - Pager - [phone] After 6pm go to [email or web address] - password [email or web address] [facility]  Office  [phone]  CC: Primary care physician; [name], MD

## 2015-03-06 NOTE — ED Notes (Signed)
Patient transported to CT 

## 2015-03-06 NOTE — Telephone Encounter (Signed)
Pt is going to [facility]

## 2015-03-06 NOTE — Consult Note (Signed)
ANTICOAGULATION CONSULT NOTE - Initial Consult  Pharmacy Consult for warfarin Indication: atrial fibrillation  Allergies  Allergen Reactions  . Ilosone [Erythromycin] Other (See Comments)    Reaction:  GI upset   . Penicillins Rash and Other (See Comments)    Has patient had a PCN reaction causing immediate rash, facial/tongue/throat swelling, SOB or lightheadedness with hypotension: No Has patient had a PCN reaction causing severe rash involving mucus membranes or skin necrosis: No Has patient had a PCN reaction that required hospitalization No Has patient had a PCN reaction occurring within the last 10 years: No If all of the above answers are "NO", then may proceed with Cephalosporin use.    Patient Measurements: Height: 5\' 6"  (167.6 cm) Weight: 152 lb (68.947 kg) IBW/kg (Calculated) : 63.8 Heparin Dosing Weight:   Vital Signs: Temp: 98 F (36.7 C) (02/21 2100) Temp Source: Oral (02/21 2100) BP: 149/76 mmHg (02/21 2100) Pulse Rate: 92 (02/21 2100)  Labs:  Recent Labs  03/06/15 1526  HGB 12.5*  HCT 37.4*  PLT 199  APTT 53*  LABPROT 24.4*  INR 2.22  CREATININE 0.98  TROPONINI <0.03    Estimated Creatinine Clearance: 47.9 mL/min (by C-G formula based on Cr of 0.98).   Medical History: Past Medical History  Diagnosis Date  . CAD (coronary artery disease)     s/p bypass graft x 4 (1991)  . GERD (gastroesophageal reflux disease)   . Atrial fibrillation ([place])   . Anemia     iron deficiency  . Hypertension   . Hypercholesterolemia   . Pancreatitis     x2 (1994 and 1998), presumed to be gallstone pancreatitis.  s/p ERCP and sphincterotomy  . Bilateral renal cysts     worked up by Dr Bernardo Heater  . Glaucoma   . BPH (benign prostatic hypertrophy)   . Diverticulosis   . Arthritis     Medications:  Scheduled:  . acetaminophen  1,000 mg Oral BID  . atorvastatin  20 mg Oral QHS  . Calcium Carbonate-Vitamin D  1 tablet Oral Daily  . diltiazem  240 mg Oral QHS   . enoxaparin (LOVENOX) injection  40 mg Subcutaneous Q24H  . furosemide  20 mg Oral Daily  . latanoprost  1 drop Both Eyes QHS  . levofloxacin (LEVAQUIN) IV  750 mg Intravenous Once  . [START ON 03/08/2015] levofloxacin (LEVAQUIN) IV  750 mg Intravenous Q48H  . metoprolol tartrate  25 mg Oral BID  . multivitamin with minerals  1 tablet Oral Daily  . pantoprazole  40 mg Oral Daily  . sodium chloride flush  3 mL Intravenous Q12H  . tamsulosin  0.4 mg Oral QHS  . vitamin C  500 mg Oral Daily  . warfarin  2 mg Oral QPM  . warfarin  2.5 mg Oral QPM    Assessment: Pt is a 82 year old male with afib. Pt takes warfarin at home. Home dose is 4.5mg  daily. INR on admission is therapeutic at 2.22.   Goal of Therapy:  INR 2-3 Monitor platelets by anticoagulation protocol: Yes   Plan:  Resume pt home dose of 4.5mg  daily and recheck INR in the AM. Will check INR daily while pt is on antibiotics as potential for interaction.  Ramond Dial, Pharm.D Clinical Pharmacist 03/06/2015,9:25 PM

## 2015-03-06 NOTE — ED Provider Notes (Addendum)
[facility] Emergency Department Provider Note  ____________________________________________  Time seen: Approximately 4:44 PM  I have reviewed the triage vital signs and the nursing notes.   HISTORY  Chief Complaint Cough; Nasal Congestion; Headache; and Fever    HPI Tyler Stout is a 82 y.o. male with CAD s/p CABG, atrial fibrillation on Coumadin, hyperlipidemia and hypertension who presents for evaluation of 3 days of fever as well as productive cough and runny nose, gradual onset, constant since onset, currently moderate to severe, no modifying factors. No known sick contacts. Patient also suffered a mechanical fall with head injury 3 days ago but denies severe headache at this time. No neck pain or neck stiffness. No abdominal pain, nausea, vomiting, diarrhea. He has had a fever at home to 102F. Initially, cough is productive of brownish sputum however he has seen some pink tinged sputum as well though there has been no flank blood. No chest pain. No shortness of breath.   Past Medical History  Diagnosis Date  . CAD (coronary artery disease)     s/p bypass graft x 4 (1991)  . GERD (gastroesophageal reflux disease)   . Atrial fibrillation ([place])   . Anemia     iron deficiency  . Hypertension   . Hypercholesterolemia   . Pancreatitis     x2 (1994 and 1998), presumed to be gallstone pancreatitis.  s/p ERCP and sphincterotomy  . Bilateral renal cysts     worked up by Dr Bernardo Heater  . Glaucoma   . BPH (benign prostatic hypertrophy)   . Diverticulosis   . Arthritis     Patient Active Problem List   Diagnosis Date Noted  . Health care maintenance 08/11/2014  . Light headedness 03/19/2014  . Sinus congestion 08/13/2013  . Nausea 05/09/2013  . Back pain 03/13/2013  . Hypokalemia 02/20/2013  . Diarrhea 02/20/2013  . CAD (coronary artery disease) 12/07/2011  . Hyponatremia 12/07/2011  . Atrial fibrillation ([place]) 12/07/2011  . Hypertension 11/20/2011   . Hypercholesteremia 11/20/2011  . Anemia 11/20/2011    Past Surgical History  Procedure Laterality Date  . Appendectomy    . Coronary artery bypass graft      x 4,  1991  . Cholecystectomy  1994  . Inguinal hernia repair  2009  . Open heart surgery  1991    Current Outpatient Rx  Name  Route  Sig  Dispense  Refill  . atorvastatin (LIPITOR) 20 MG tablet      TAKE 1 TABLET (20 MG TOTAL) BY MOUTH DAILY.   90 tablet   5   . Calcium Carbonate-Vitamin D (CALCIUM 600+D) 600-400 MG-UNIT per tablet   Oral   Take 1 tablet by mouth daily.         . citalopram (CELEXA) 20 MG tablet      TAKE 1 TABLET (20 MG TOTAL) BY MOUTH DAILY.   30 tablet   9   . Diltiazem HCl Coated Beads (CARDIZEM CD PO)   Oral   Take 120 mg by mouth daily. As directed         . Fe Fum-FePoly-Vit C-Vit B3 (INTEGRA) 62.5-62.5-40-3 MG CAPS   Oral   Take 1 capsule by mouth daily.   30 capsule   2   . fish oil-omega-3 fatty acids 1000 MG capsule   Oral   Take 1 g by mouth daily.         . furosemide (LASIX) 40 MG tablet   Oral   Take  40 mg by mouth daily.      11   . latanoprost (XALATAN) 0.005 % ophthalmic solution   Both Eyes   Place 1 drop into both eyes daily. 1 drop in each eye         . metoprolol tartrate (LOPRESSOR) 25 MG tablet   Oral   Take 25 mg by mouth 2 (two) times daily.         . Multiple Vitamin (MULTI-VITAMIN DAILY PO)   Oral   Take by mouth.         . pantoprazole (PROTONIX) 40 MG tablet      TAKE 1 TABLET BY MOUTH DAILY   90 tablet   1   . tamsulosin (FLOMAX) 0.4 MG CAPS capsule      TAKE 1 CAPSULE (0.4 MG TOTAL) BY MOUTH DAILY.   30 capsule   3   . vitamin C (ASCORBIC ACID) 500 MG tablet   Oral   Take 500 mg by mouth daily.         Marland Kitchen warfarin (COUMADIN) 2 MG tablet   Oral   Take 2 mg by mouth daily.         Marland Kitchen warfarin (COUMADIN) 5 MG tablet   Oral   Take 2.5 mg by mouth at bedtime. As directed           Allergies Ilosone and  Penicillins  Family History  Problem Relation Age of Onset  . Throat cancer Father     oropharyngeal cancer  . Kidney disease Sister   . Heart disease    . Colon cancer Neg Hx   . Prostate cancer Neg Hx     Social History Social History  Substance Use Topics  . Smoking status: Former Smoker    Quit date: 01/14/1964  . Smokeless tobacco: Never Used  . Alcohol Use: No    Review of Systems Constitutional: + fever/chills Eyes: No visual changes. ENT: No sore throat. Cardiovascular: Denies chest pain. Respiratory: Denies shortness of breath. Gastrointestinal: No abdominal pain.  No nausea, no vomiting.  No diarrhea.  No constipation. Genitourinary: Negative for dysuria. Musculoskeletal: Negative for back pain. Skin: Negative for rash. Neurological: Negative for headaches, focal weakness or numbness.  10-point ROS otherwise negative.  ____________________________________________   PHYSICAL EXAM:  VITAL SIGNS: ED Triage Vitals  Enc Vitals Group     BP 03/06/15 1518 132/60 mmHg     Pulse Rate 03/06/15 1518 71     Resp 03/06/15 1518 18     Temp 03/06/15 1518 97.5 F (36.4 C)     Temp Source 03/06/15 1518 Oral     SpO2 03/06/15 1518 96 %     Weight 03/06/15 1518 152 lb (68.947 kg)     Height 03/06/15 1518 5\' 6"  (1.676 m)     Head Cir --      Peak Flow --      Pain Score 03/06/15 1520 4     Pain Loc --      Pain Edu? --      Excl. in [place]? --     Constitutional: Alert and oriented. Nontoxic-appearing and in no acute distress. Eyes: Conjunctivae are normal. PERRL. EOMI. Head: No bony stepoff or deformity of the skull. There is a small scab on the left cheek, no surrounding bony abnormality, swelling, tenderness. Nose: + congestion/rhinnorhea. Mouth/Throat: Mucous membranes are dry.  Oropharynx non-erythematous. Neck: No stridor. No cervical spine tenderness to palpation. Cardiovascular: tachycardic rate, irregular rhythm. Grossly normal  heart sounds.  Good  peripheral circulation. Respiratory: Normal respiratory effort.  No retractions. Lungs CTAB. Moderate tachypnea. Gastrointestinal: Soft and nontender. No distention.  No CVA tenderness. Genitourinary: deferred Musculoskeletal: No lower extremity tenderness nor edema.  No joint effusions. Neurologic:  Normal speech and language. No gross focal neurologic deficits are appreciated.  Skin:  Skin is warm, dry and intact. No rash noted. Psychiatric: Mood and affect are normal. Speech and behavior are normal.  ____________________________________________   LABS (all labs ordered are listed, but only abnormal results are displayed)  Labs Reviewed  BASIC METABOLIC PANEL - Abnormal; Notable for the following:    Glucose, Bld 129 (*)    BUN 21 (*)    Calcium 8.6 (*)    All other components within normal limits  CBC - Abnormal; Notable for the following:    RBC 4.03 (*)    Hemoglobin 12.5 (*)    HCT 37.4 (*)    RDW 14.6 (*)    All other components within normal limits  PROTIME-INR - Abnormal; Notable for the following:    Prothrombin Time 24.4 (*)    All other components within normal limits  APTT - Abnormal; Notable for the following:    aPTT 53 (*)    All other components within normal limits  URINALYSIS COMPLETEWITH MICROSCOPIC ([facility] ONLY) - Abnormal; Notable for the following:    Color, Urine YELLOW (*)    APPearance CLEAR (*)    Hgb urine dipstick 2+ (*)    All other components within normal limits  RAPID INFLUENZA A&B ANTIGENS ([facility] ONLY)  CULTURE, BLOOD (ROUTINE X 2)  CULTURE, BLOOD (ROUTINE X 2)  TROPONIN I  LACTIC ACID, PLASMA  LACTIC ACID, PLASMA   ____________________________________________  EKG  ED ECG REPORT I, Joanne Gavel, the attending physician, personally viewed and interpreted this ECG.   Date: 03/06/2015  EKG Time: 15:24  Rate: 94  Rhythm: atrial flutter with variable av block  Axis: normal  Intervals:right bundle branch block  ST&T Change: No  acute ST elevation  ____________________________________________  RADIOLOGY  CXR  IMPRESSION: Cardiomegaly, COPD. No active disease.  ____________________________________________   PROCEDURES  Procedure(s) performed: None  Critical Care performed: No  ____________________________________________   INITIAL IMPRESSION / ASSESSMENT AND PLAN / ED COURSE  Pertinent labs & imaging results that were available during my care of the patient were reviewed by me and considered in my medical decision making (see chart for details).  Tyler Stout is a 82 y.o. male with CAD s/p CABG, atrial fibrillation on Coumadin, hyperlipidemia and hypertension who presents for evaluation of 3 days of fever as well as productive cough. On exam, he is in no acute distress but he is tachycardic with a heart rate of 105 bpm and tachypnea with a respiratory rate of 28 rest per minute meeting 2 out of 4 Sirs criteria. Labs reviewed. BMP unremarkable. Troponin negative. CBC with mild anemia, no leukocytosis. He is appropriately anticoagulated with INR 2.2. Chest x-ray shows no active cardiopulmonary disease. Flu pending. Symptoms may be secondary to viral syndrome however given his age and vital sign abnormalities, we'll begin empiric treatment for possible sepsis, obtain urinalysis, blood cultures. We'll give IV fluids, will not give full 30 mL/kg bolus given significant cardiac history, risk of inducing flash pulmonary edema. We'll give IV vancomycin and aztreonam.  ----------------------------------------- 7:05 PM on 03/06/2015 ----------------------------------------- Chest x-ray clear. Flu negative. Urinalysis not  consistent with infection. Venous lactic acid 1.7. Symptoms may be secondary to  viral syndrome however bacteremia remains on the differential. The patient remains mildly tachycardic, still tachypneic, and given age and multiple comorbidities, I discussed the case with the hospitalist, Dr. Benjie Karvonen,  for admission.  ____________________________________________   FINAL CLINICAL IMPRESSION(S) / ED DIAGNOSES  Final diagnoses:  Cough  SIRS (systemic inflammatory response syndrome) (HCC)      Joanne Gavel, MD 03/06/15 1906  Joanne Gavel, MD 03/06/15 1907

## 2015-03-06 NOTE — ED Notes (Signed)
Pt to ed with c/o cough, congestion, fever, since Saturday.  Pt states he slipped off of bed and fell hitting left side of face on Friday pm.

## 2015-03-06 NOTE — Telephone Encounter (Signed)
Reason for call: pt fell on the side of bed and hit his cheek Friday morning Symptoms: pt ran a fever on Saturday of 101.0 and has been normal since then, when pt spits its coming out blood tinged, slight headache Duration: 5 days ago Medications: [place] Last seen for this problem:no  Seen by:  Please advise, thanks

## 2015-03-06 NOTE — Telephone Encounter (Signed)
Pt advise to go to the nearest ED

## 2015-03-07 ENCOUNTER — Telehealth: Payer: Self-pay | Admitting: Internal Medicine

## 2015-03-07 ENCOUNTER — Other Ambulatory Visit: Payer: Self-pay | Admitting: Internal Medicine

## 2015-03-07 ENCOUNTER — Inpatient Hospital Stay: Payer: Medicare HMO

## 2015-03-07 DIAGNOSIS — I4891 Unspecified atrial fibrillation: Secondary | ICD-10-CM

## 2015-03-07 DIAGNOSIS — D508 Other iron deficiency anemias: Secondary | ICD-10-CM

## 2015-03-07 DIAGNOSIS — E7800 Pure hypercholesterolemia, unspecified: Secondary | ICD-10-CM

## 2015-03-07 DIAGNOSIS — I10 Essential (primary) hypertension: Secondary | ICD-10-CM

## 2015-03-07 LAB — PROTIME-INR
INR: 2.49
Prothrombin Time: 26.6 seconds — ABNORMAL HIGH (ref 11.4–15.0)

## 2015-03-07 LAB — LACTIC ACID, PLASMA: LACTIC ACID, VENOUS: 1.3 mmol/L (ref 0.5–2.0)

## 2015-03-07 MED ORDER — LEVOFLOXACIN 750 MG PO TABS: 750.0000 mg | ORAL_TABLET | Freq: Every day | ORAL | Status: DC

## 2015-03-07 NOTE — Progress Notes (Signed)
Pharmacy Antibiotic Note  Tyler Stout is a 82 y.o. male admitted on 03/06/2015 with pneumonia and sepsis.  Pharmacy has been consulted for Levaquin dosing.  Plan: Will continue Levaquin 750 mg iv q 48 hours. Patient has orders for discharge with Levaquin to be continued x 5 more days.   Height: 5\' 6"  (167.6 cm) Weight: 154 lb 3.2 oz (69.945 kg) IBW/kg (Calculated) : 63.8  Temp (24hrs), Avg:98 F (36.7 C), Min:97.5 F (36.4 C), Max:98.4 F (36.9 C)   Recent Labs Lab 03/06/15 1526 03/06/15 1716 03/06/15 2317  WBC 6.9  --   --   CREATININE 0.98  --   --   LATICACIDVEN  --  1.7 1.3    Estimated Creatinine Clearance: 47.9 mL/min (by C-G formula based on Cr of 0.98).    Allergies  Allergen Reactions  . Ilosone [Erythromycin] Other (See Comments)    Reaction:  GI upset   . Penicillins Rash and Other (See Comments)    Has patient had a PCN reaction causing immediate rash, facial/tongue/throat swelling, SOB or lightheadedness with hypotension: No Has patient had a PCN reaction causing severe rash involving mucus membranes or skin necrosis: No Has patient had a PCN reaction that required hospitalization No Has patient had a PCN reaction occurring within the last 10 years: No If all of the above answers are "NO", then may proceed with Cephalosporin use.    Antimicrobials this admission: Aztreonam and vancomycin  02/21 >> 02/21 Levaquin 02/21  >>   Dose adjustments this admission:   Microbiology results: 02/21 BCx: NGTD 02/21 Influenza: negative  Thank you for allowing pharmacy to be a part of this patient's care.  Ulice Dash D 03/07/2015 11:53 AM

## 2015-03-07 NOTE — Discharge Summary (Signed)
[facility] [place] NAME: Tyler Stout    MR#:  [id number]  DATE OF BIRTH:  Nov 30, 1925  DATE OF ADMISSION:  03/06/2015 ADMITTING PHYSICIAN: Bettey Costa, MD  DATE OF DISCHARGE: 03/07/2015  PRIMARY CARE PHYSICIAN: [name], MD    ADMISSION DIAGNOSIS:  Cough [R05] SIRS (systemic inflammatory response syndrome) (HCC) [R65.10]  DISCHARGE DIAGNOSIS:  Active Problems:   Sepsis ([place])   SECONDARY DIAGNOSIS:   Past Medical History  Diagnosis Date  . CAD (coronary artery disease)     s/p bypass graft x 4 (1991)  . GERD (gastroesophageal reflux disease)   . Atrial fibrillation ([place])   . Anemia     iron deficiency  . Hypertension   . Hypercholesterolemia   . Pancreatitis     x2 (1994 and 1998), presumed to be gallstone pancreatitis.  s/p ERCP and sphincterotomy  . Bilateral renal cysts     worked up by Dr Bernardo Heater  . Glaucoma   . BPH (benign prostatic hypertrophy)   . Diverticulosis   . Arthritis     HOSPITAL COURSE:  This is an 82 year old male with a history of atrial fibrillation on anticoagulation and essential hypertension who presents with sepsis.  1. Sepsis: Patient presented with tachypnea and tachycardia. He had a fever 102 at home. Her chest x-ray did not show evidence of pneumonia. Follow-up chest x-ray did show evidence of pneumonia. Sepsis is due to community-acquired pneumonia. Patient was treated with Levaquin. Sepsis is resolved.  2. Atrial fibrillation/flutter: INR is therapeutic. He will need a follow-up with his PCP on Monday for an INR check since he is on Levaquin.. Continue metoprolol and diltiazem.   3. Essential hypertension: Continue diltiazem and metoprolol. 4. BPH: Continue tamsulosin.  5. Hyperlipidemia: Continue atorvastatin  6. Glaucoma: Continue eyedrops.    DISCHARGE CONDITIONS AND DIET:   Patient is stable for discharge on a heart healthy diet  CONSULTS OBTAINED:     DRUG  ALLERGIES:   Allergies  Allergen Reactions  . Ilosone [Erythromycin] Other (See Comments)    Reaction:  GI upset   . Penicillins Rash and Other (See Comments)    Has patient had a PCN reaction causing immediate rash, facial/tongue/throat swelling, SOB or lightheadedness with hypotension: No Has patient had a PCN reaction causing severe rash involving mucus membranes or skin necrosis: No Has patient had a PCN reaction that required hospitalization No Has patient had a PCN reaction occurring within the last 10 years: No If all of the above answers are "NO", then may proceed with Cephalosporin use.    DISCHARGE MEDICATIONS:   Current Discharge Medication List    START taking these medications   Details  levofloxacin (LEVAQUIN) 750 MG tablet Take 1 tablet (750 mg total) by mouth daily. Qty: 5 tablet, Refills: 0      CONTINUE these medications which have NOT CHANGED   Details  acetaminophen (TYLENOL) 500 MG tablet Take 1,000 mg by mouth 2 (two) times daily.    atorvastatin (LIPITOR) 20 MG tablet Take 20 mg by mouth at bedtime.    Calcium Carbonate-Vitamin D (CALCIUM 600+D) 600-400 MG-UNIT per tablet Take 1 tablet by mouth daily.    diltiazem (CARDIZEM CD) 240 MG 24 hr capsule Take 240 mg by mouth at bedtime.    furosemide (LASIX) 20 MG tablet Take 20 mg by mouth daily.    latanoprost (XALATAN) 0.005 % ophthalmic solution Place 1 drop into both eyes at bedtime.  metoprolol tartrate (LOPRESSOR) 25 MG tablet Take 25 mg by mouth 2 (two) times daily.    Multiple Vitamin (MULTIVITAMIN WITH MINERALS) TABS tablet Take 1 tablet by mouth daily.    Omega-3 Fatty Acids (FISH OIL) 1000 MG CAPS Take 1,000 mg by mouth daily.    pantoprazole (PROTONIX) 40 MG tablet Take 40 mg by mouth daily.    tamsulosin (FLOMAX) 0.4 MG CAPS capsule Take 0.4 mg by mouth at bedtime.    vitamin C (ASCORBIC ACID) 500 MG tablet Take 500 mg by mouth daily.    !! warfarin (COUMADIN) 2 MG tablet Take 2 mg  by mouth every evening. Pt takes with one-half of a 5mg  tablet.    !! warfarin (COUMADIN) 5 MG tablet Take 2.5 mg by mouth every evening. Pt takes with a 2mg  tablet.     !! - Potential duplicate medications found. Please discuss with provider.            Today   CHIEF COMPLAINT:  Patient is doing well this morning. Patient reports feeling 100% better. Denies fever or cough.  VITAL SIGNS:  Blood pressure 130/71, pulse 109, temperature 98.4 F (36.9 C), temperature source Oral, resp. rate 20, height 5\' 6"  (1.676 m), weight 69.945 kg (154 lb 3.2 oz), SpO2 97 %.   REVIEW OF SYSTEMS:  Review of Systems  Constitutional: Negative for fever, chills and malaise/fatigue.  HENT: Negative for sore throat.   Eyes: Negative for blurred vision.  Respiratory: Negative for cough, hemoptysis, shortness of breath and wheezing.   Cardiovascular: Negative for chest pain, palpitations and leg swelling.  Gastrointestinal: Negative for nausea, vomiting, abdominal pain, diarrhea and blood in stool.  Genitourinary: Negative for dysuria.  Musculoskeletal: Negative for back pain.  Neurological: Negative for dizziness, tremors and headaches.  Endo/Heme/Allergies: Does not bruise/bleed easily.     PHYSICAL EXAMINATION:  GENERAL:  82 y.o.-year-old patient lying in the bed with no acute distress.  NECK:  Supple, no jugular venous distention. No thyroid enlargement, no tenderness.  LUNGS: Normal breath sounds bilaterally, no wheezing, rales,rhonchi  No use of accessory muscles of respiration.  CARDIOVASCULAR: S1, S2 normal. No murmurs, rubs, or gallops.  ABDOMEN: Soft, non-tender, non-distended. Bowel sounds present. No organomegaly or mass.  EXTREMITIES: No pedal edema, cyanosis, or clubbing.  PSYCHIATRIC: The patient is alert and oriented x 3.  SKIN: No obvious rash, lesion, or ulcer.   DATA REVIEW:   CBC  Recent Labs Lab 03/06/15 1526  WBC 6.9  HGB 12.5*  HCT 37.4*  PLT 199     Chemistries   Recent Labs Lab 03/06/15 1526  NA 136  K 3.8  CL 103  CO2 26  GLUCOSE 129*  BUN 21*  CREATININE 0.98  CALCIUM 8.6*    Cardiac Enzymes  Recent Labs Lab 03/06/15 1526  [place] <0.03    Microbiology Results  @[email or web address] @  RADIOLOGY:  Dg Chest 1 View  03/07/2015  CLINICAL DATA:  Acute onset of shortness of breath. Initial encounter. EXAM: CHEST 1 VIEW COMPARISON:  Chest radiograph performed 03/06/2015 FINDINGS: The lungs are well-aerated. Mild vascular congestion is noted. Mild left-sided airspace opacity raises concern for pneumonia. There is no evidence of pleural effusion or pneumothorax. The cardiomediastinal silhouette is mildly enlarged. The patient is status post median sternotomy, with evidence of prior CABG. No acute osseous abnormalities are seen. IMPRESSION: Mild vascular congestion and mild cardiomegaly. Mild left-sided airspace opacity raises concern for pneumonia, given clinical concern. Electronically Signed   By: Garald Balding  M.D.   On: 03/07/2015 03:33   Dg Chest 2 View  03/06/2015  CLINICAL DATA:  Cough, congestion, fever, shortness of Breath EXAM: CHEST  2 VIEW COMPARISON:  10/13/2011 FINDINGS: Prior CABG. Cardiomegaly. There is hyperinflation of the lungs compatible with COPD. No confluent airspace opacities or effusions. No acute bony abnormality. IMPRESSION: Cardiomegaly, COPD.  No active disease. Electronically Signed   By: Rolm Baptise M.D.   On: 03/06/2015 15:50   Ct Head Wo Contrast  03/06/2015  CLINICAL DATA:  Struck left face falling out of bed Friday night. Fever and congestion. EXAM: CT HEAD WITHOUT CONTRAST TECHNIQUE: Contiguous axial images were obtained from the base of the skull through the vertex without intravenous contrast. COMPARISON:  10/25/2012 FINDINGS: The brainstem, cerebellum, cerebral peduncles, thalami, basal ganglia, basilar cisterns, and ventricular system appear within normal limits. Periventricular white matter  and corona radiata hypodensities favor chronic ischemic microvascular white matter disease. Small lacunar infarcts the left lentiform nucleus, stable. Age-appropriate cerebral atrophy. Upper dental prosthesis noted. Chronic bilateral maxillary sinusitis. Mild chronic left sphenoid sinusitis. IMPRESSION: 1. No acute intracranial findings. 2. Remote small lacunar infarcts in the left lentiform nucleus. Periventricular white matter and corona radiata hypodensities favor chronic ischemic microvascular white matter disease. 3. Mild chronic bilateral maxillary sinusitis and mild chronic left sphenoid sinusitis. Electronically Signed   By: Van Clines M.D.   On: 03/06/2015 18:15      Management plans discussed with the patient and he is in agreement. Stable for discharge home  Patient should follow up with PCP in 3 days for INR check  CODE STATUS:     Code Status Orders        Start     Ordered   03/06/15 2118  Full code   Continuous     03/06/15 2117    Code Status History    Date Active Date Inactive Code Status Order ID Comments User Context   This patient has a current code status but no historical code status.      TOTAL TIME TAKING CARE OF THIS PATIENT: 35 minutes.    Note: This dictation was prepared with Dragon dictation along with smaller phrase technology. Any transcriptional errors that result from this process are unintentional.  Abi Shoults M.D on 03/07/2015 at 11:07 AM  Between 7am to 6pm - Pager - [phone] After 6pm go to [email or web address] - password [email or web address] [facility]  Office  [phone]  CC: Primary care physician; [name], MD

## 2015-03-07 NOTE — Telephone Encounter (Signed)
HFU, Pt is being discharged today. Diagnosis is Sepsis. No appt avail to sch. Pt needs to be seen in the next 3 days for INR pt is on levaquin and coumadin. Let me know where to sch pt. Call pt @ [phone]. Thank you!

## 2015-03-07 NOTE — Progress Notes (Signed)
Discharge instructions review with patient and wife. Both verbalized understanding. IV removed. Patient discharged via wheelchair. All personal belongings with patient. (hearing aid, dentures, ring and clothing. )

## 2015-03-07 NOTE — Telephone Encounter (Signed)
I can see him on 03/14/15 at 1:00.  He can get INR drawn here or at cardiology on Monday (they follow his coumadin).  Let me know if I need to order.

## 2015-03-07 NOTE — Progress Notes (Signed)
Orders placed for labs

## 2015-03-07 NOTE — Telephone Encounter (Signed)
Spoke with Caryl Pina and availability is 03/14/15 at 1 OR 03/19/15 at 12.   See other message I sent you and let me know placement.

## 2015-03-07 NOTE — Consult Note (Signed)
ANTICOAGULATION CONSULT NOTE - Initial Consult  Pharmacy Consult for warfarin Indication: atrial fibrillation  Allergies  Allergen Reactions  . Ilosone [Erythromycin] Other (See Comments)    Reaction:  GI upset   . Penicillins Rash and Other (See Comments)    Has patient had a PCN reaction causing immediate rash, facial/tongue/throat swelling, SOB or lightheadedness with hypotension: No Has patient had a PCN reaction causing severe rash involving mucus membranes or skin necrosis: No Has patient had a PCN reaction that required hospitalization No Has patient had a PCN reaction occurring within the last 10 years: No If all of the above answers are "NO", then may proceed with Cephalosporin use.    Patient Measurements: Height: 5\' 6"  (167.6 cm) Weight: 154 lb 3.2 oz (69.945 kg) IBW/kg (Calculated) : 63.8 Heparin Dosing Weight:   Vital Signs: Temp: 98.4 F (36.9 C) (02/22 0910) Temp Source: Oral (02/22 0910) BP: 130/71 mmHg (02/22 0910) Pulse Rate: 109 (02/22 0910)  Labs:  Recent Labs  03/06/15 1526 03/07/15 0553  HGB 12.5*  --   HCT 37.4*  --   PLT 199  --   APTT 53*  --   LABPROT 24.4* 26.6*  INR 2.22 2.49  CREATININE 0.98  --   TROPONINI <0.03  --     Estimated Creatinine Clearance: 47.9 mL/min (by C-G formula based on Cr of 0.98).   Medical History: Past Medical History  Diagnosis Date  . CAD (coronary artery disease)     s/p bypass graft x 4 (1991)  . GERD (gastroesophageal reflux disease)   . Atrial fibrillation ([place])   . Anemia     iron deficiency  . Hypertension   . Hypercholesterolemia   . Pancreatitis     x2 (1994 and 1998), presumed to be gallstone pancreatitis.  s/p ERCP and sphincterotomy  . Bilateral renal cysts     worked up by Dr Bernardo Heater  . Glaucoma   . BPH (benign prostatic hypertrophy)   . Diverticulosis   . Arthritis     Medications:  Scheduled:  . acetaminophen  1,000 mg Oral BID  . atorvastatin  20 mg Oral QHS  .  calcium-vitamin D  1 tablet Oral Daily  . diltiazem  240 mg Oral QHS  . furosemide  20 mg Oral Daily  . latanoprost  1 drop Both Eyes QHS  . [START ON 03/08/2015] levofloxacin (LEVAQUIN) IV  750 mg Intravenous Q48H  . metoprolol tartrate  25 mg Oral BID  . multivitamin with minerals  1 tablet Oral Daily  . pantoprazole  40 mg Oral Daily  . sodium chloride flush  3 mL Intravenous Q12H  . tamsulosin  0.4 mg Oral QHS  . vitamin C  500 mg Oral Daily  . warfarin  4.5 mg Oral q1800  . Warfarin - Pharmacist Dosing Inpatient   Does not apply q1800    Assessment: Pt is a 82 year old male with afib. Pt takes warfarin at home. Home dose is 4.5mg  daily. INR on admission is therapeutic at 2.22.   Goal of Therapy:  INR 2-3 Monitor platelets by anticoagulation protocol: Yes   Plan:  Will continue warfarin 4.5 mg daily. Will check INR daily while pt is on antibiotics as potential for interaction.  Ulice Dash, PharmD Clinical Pharmacist   03/07/2015,11:47 AM

## 2015-03-07 NOTE — Telephone Encounter (Signed)
Went to ER.  Admitted.

## 2015-03-08 ENCOUNTER — Telehealth: Payer: Self-pay

## 2015-03-08 NOTE — Telephone Encounter (Signed)
Thank you.  I will continue to follow as appropriate.

## 2015-03-08 NOTE — Telephone Encounter (Signed)
Unable to reach patient.  No answer on either phone number listed.  Attempt to follow up with transitional care management since discharged home from the hospital.  Will try to call again tomorrow.  Appointment scheduled for follow up with PCP.

## 2015-03-08 NOTE — Telephone Encounter (Signed)
Pt had an appt schedule for 3/2 we kept that appt and made it a HFU.Marland Kitchen

## 2015-03-09 ENCOUNTER — Telehealth: Payer: Self-pay

## 2015-03-09 NOTE — Telephone Encounter (Signed)
Transition Care Management Follow-up Telephone Call   Date discharged? 03/07/15   How have you been since you were released from the hospital? Some back pain when coughing, but not coughing as much.  Resting very well.  Eating and drinking with no issues.  BM/voiding ok. No fever.   Do you understand why you were in the hospital? Coughing and not feeling well.     Do you understand the discharge instructions? Moving slowly between activities and my wife is helping to make sure all is ok.   Where were you discharged to? Home   Items Reviewed:  Medications reviewed: Yes, taking all medications as scheduled and without issues  Allergies reviewed: Yes, no changes  Dietary changes reviewed: Yes, heart healthy diet, no problems   Referrals reviewed: Yes, INR check scheduled 03/12/15 and follow up with PCP    Functional Questionnaire:   Activities of Daily Living (ADLs):   He states they are independent in the following: Independent with all ADLs States they require assistance with the following: Needs no assistance at this time   Any transportation issues/concerns?: No   Any patient concerns? Pinched nerve feeling in the back when coughing   Confirmed importance and date/time of follow-up visits scheduled Yes, appointment scheduled 03/15/15  Provider Appointment booked with Dr. Nicki Reaper (PCP)  Confirmed with patient if condition begins to worsen call PCP or go to the ER.  Patient was given the office number and encouraged to call back with question or concerns.  : Yes, patient verbalized understanding

## 2015-03-11 LAB — CULTURE, BLOOD (ROUTINE X 2)
CULTURE: NO GROWTH
Culture: NO GROWTH

## 2015-03-12 ENCOUNTER — Other Ambulatory Visit (INDEPENDENT_AMBULATORY_CARE_PROVIDER_SITE_OTHER): Payer: Medicare HMO

## 2015-03-12 DIAGNOSIS — I10 Essential (primary) hypertension: Secondary | ICD-10-CM | POA: Diagnosis not present

## 2015-03-12 DIAGNOSIS — I4891 Unspecified atrial fibrillation: Secondary | ICD-10-CM

## 2015-03-12 DIAGNOSIS — E7800 Pure hypercholesterolemia, unspecified: Secondary | ICD-10-CM | POA: Diagnosis not present

## 2015-03-12 DIAGNOSIS — D508 Other iron deficiency anemias: Secondary | ICD-10-CM | POA: Diagnosis not present

## 2015-03-12 LAB — CBC WITH DIFFERENTIAL/PLATELET
BASOS PCT: 0.3 % (ref 0.0–3.0)
Basophils Absolute: 0 10*3/uL (ref 0.0–0.1)
EOS ABS: 0.1 10*3/uL (ref 0.0–0.7)
EOS PCT: 0.9 % (ref 0.0–5.0)
HCT: 37.9 % — ABNORMAL LOW (ref 39.0–52.0)
HEMOGLOBIN: 12.8 g/dL — AB (ref 13.0–17.0)
LYMPHS ABS: 0.9 10*3/uL (ref 0.7–4.0)
LYMPHS PCT: 16.2 % (ref 12.0–46.0)
MCHC: 33.7 g/dL (ref 30.0–36.0)
MCV: 90.7 fl (ref 78.0–100.0)
MONO ABS: 0.6 10*3/uL (ref 0.1–1.0)
MONOS PCT: 10.2 % (ref 3.0–12.0)
NEUTROS PCT: 72.4 % (ref 43.0–77.0)
Neutro Abs: 4.2 10*3/uL (ref 1.4–7.7)
PLATELETS: 310 10*3/uL (ref 150.0–400.0)
RBC: 4.18 Mil/uL — ABNORMAL LOW (ref 4.22–5.81)
RDW: 14.5 % (ref 11.5–15.5)
WBC: 5.8 10*3/uL (ref 4.0–10.5)

## 2015-03-12 LAB — LIPID PANEL
Cholesterol: 100 mg/dL (ref 0–200)
HDL: 33.2 mg/dL — AB (ref >39.00)
LDL Cholesterol: 50 mg/dL (ref 0–99)
NONHDL: 66.73
Total CHOL/HDL Ratio: 3
Triglycerides: 83 mg/dL (ref 0.0–149.0)
VLDL: 16.6 mg/dL (ref 0.0–40.0)

## 2015-03-12 LAB — BASIC METABOLIC PANEL
BUN: 21 mg/dL (ref 6–23)
CALCIUM: 9.1 mg/dL (ref 8.4–10.5)
CO2: 26 meq/L (ref 19–32)
CREATININE: 0.99 mg/dL (ref 0.40–1.50)
Chloride: 102 mEq/L (ref 96–112)
GFR: 75.88 mL/min (ref >60.00)
Glucose, Bld: 102 mg/dL — ABNORMAL HIGH (ref 70–99)
Potassium: 4 mEq/L (ref 3.5–5.1)
SODIUM: 136 meq/L (ref 135–145)

## 2015-03-12 LAB — PROTIME-INR
INR: 2.9 ratio — AB (ref 0.8–1.0)
PROTHROMBIN TIME: 30.8 s — AB (ref 9.6–13.1)

## 2015-03-12 LAB — HEPATIC FUNCTION PANEL
ALBUMIN: 4.1 g/dL (ref 3.5–5.2)
ALT: 23 U/L (ref 0–53)
AST: 26 U/L (ref 0–37)
Alkaline Phosphatase: 78 U/L (ref 39–117)
BILIRUBIN TOTAL: 0.4 mg/dL (ref 0.2–1.2)
Bilirubin, Direct: 0.1 mg/dL (ref 0.0–0.3)
Total Protein: 7.3 g/dL (ref 6.0–8.3)

## 2015-03-12 LAB — FERRITIN: FERRITIN: 139.9 ng/mL (ref 22.0–322.0)

## 2015-03-15 ENCOUNTER — Encounter: Payer: Self-pay | Admitting: Internal Medicine

## 2015-03-15 ENCOUNTER — Ambulatory Visit (INDEPENDENT_AMBULATORY_CARE_PROVIDER_SITE_OTHER): Payer: Medicare HMO | Admitting: Internal Medicine

## 2015-03-15 VITALS — BP 110/62 | HR 61 | Temp 97.7°F | Resp 18 | Ht 64.5 in | Wt 156.5 lb

## 2015-03-15 DIAGNOSIS — E7800 Pure hypercholesterolemia, unspecified: Secondary | ICD-10-CM

## 2015-03-15 DIAGNOSIS — I10 Essential (primary) hypertension: Secondary | ICD-10-CM | POA: Diagnosis not present

## 2015-03-15 DIAGNOSIS — A419 Sepsis, unspecified organism: Secondary | ICD-10-CM | POA: Diagnosis not present

## 2015-03-15 DIAGNOSIS — I4891 Unspecified atrial fibrillation: Secondary | ICD-10-CM | POA: Diagnosis not present

## 2015-03-15 DIAGNOSIS — Z7901 Long term (current) use of anticoagulants: Secondary | ICD-10-CM | POA: Diagnosis not present

## 2015-03-15 DIAGNOSIS — D508 Other iron deficiency anemias: Secondary | ICD-10-CM

## 2015-03-15 DIAGNOSIS — Z5181 Encounter for therapeutic drug level monitoring: Secondary | ICD-10-CM

## 2015-03-15 LAB — PROTIME-INR
INR: 2.7 ratio — AB (ref 0.8–1.0)
PROTHROMBIN TIME: 29.1 s — AB (ref 9.6–13.1)

## 2015-03-15 NOTE — Progress Notes (Signed)
Pre-visit discussion using our clinic review tool. No additional management support is needed unless otherwise documented below in the visit note.  

## 2015-03-15 NOTE — Progress Notes (Signed)
[name]: Tyler Stout, male   DOB: April 14, 1925, 82 y.o.   MRN: [id number]   Subjective:    [name]: Tyler Stout, male    DOB: 11/20/1925, 82 y.o.   MRN: [id number]  HPI  Patient with past history of CAD, GERD, aftib, hypertension and hypercholesterolemia.  He comes in today for hospital follow up.  He is accompanied by his wife.  History obtained from both of them.  Was admitted 03/06/15 with tachypnea and tachycardia.  Found to have pneumonia - sepsis.  Treated with levaquin.  No increased cough or congestion.  Breathing stable.  Eating.  No nausea or vomiting.  No bowel change.  Able to do things at home he needs to do.  Energy improving.    Past Medical History  Diagnosis Date  . CAD (coronary artery disease)     s/p bypass graft x 4 (1991)  . GERD (gastroesophageal reflux disease)   . Atrial fibrillation ([place])   . Anemia     iron deficiency  . Hypertension   . Hypercholesterolemia   . Pancreatitis     x2 (1994 and 1998), presumed to be gallstone pancreatitis.  s/p ERCP and sphincterotomy  . Bilateral renal cysts     worked up by Dr Bernardo Heater  . Glaucoma   . BPH (benign prostatic hypertrophy)   . Diverticulosis   . Arthritis    Past Surgical History  Procedure Laterality Date  . Appendectomy    . Coronary artery bypass graft      x 4,  1991  . Cholecystectomy  1994  . Inguinal hernia repair  2009  . Open heart surgery  1991   Family History  Problem Relation Age of Onset  . Throat cancer Father     oropharyngeal cancer  . Kidney disease Sister   . Heart disease    . Colon cancer Neg Hx   . Prostate cancer Neg Hx    Social History   Social History  . Marital Status: Married    Spouse Name: N/A  . Number of Children: N/A  . Years of Education: N/A   Social History Main Topics  . Smoking status: Former Smoker    Quit date: 01/14/1964  . Smokeless tobacco: Never Used  . Alcohol Use: No  . Drug Use: No  . Sexual Activity: Not Asked   Other Topics  Concern  . None   Social History Narrative     Review of Systems  Constitutional: Negative for appetite change and unexpected weight change.  HENT: Negative for congestion and sinus pressure.   Respiratory: Negative for cough, chest tightness and shortness of breath.   Cardiovascular: Negative for chest pain, palpitations and leg swelling.  Gastrointestinal: Negative for nausea, vomiting, abdominal pain and diarrhea.  Genitourinary: Negative for dysuria and difficulty urinating.  Musculoskeletal: Negative for myalgias and joint swelling.  Skin: Negative for color change and rash.  Neurological: Negative for dizziness, light-headedness and headaches.  Psychiatric/Behavioral: Negative for dysphoric mood and agitation.       Objective:    Physical Exam  Constitutional: He appears well-developed and well-nourished. No distress.  HENT:  Nose: Nose normal.  Mouth/Throat: Oropharynx is clear and moist.  Eyes: Conjunctivae are normal. Right eye exhibits no discharge. Left eye exhibits no discharge.  Neck: Neck supple.  Cardiovascular: Normal rate and regular rhythm.   Pulmonary/Chest: Effort normal and breath sounds normal. No respiratory distress.  Abdominal: Soft. Bowel sounds are normal. There is no tenderness.  Musculoskeletal: He exhibits no edema or tenderness.  Lymphadenopathy:    He has no cervical adenopathy.  Skin: No rash noted. No erythema.  Psychiatric: He has a normal mood and affect. His behavior is normal.    BP 110/62 mmHg  Pulse 61  Temp(Src) 97.7 F (36.5 C) (Oral)  Resp 18  Ht 5' 4.5" (1.638 m)  Wt 156 lb 8 oz (70.988 kg)  BMI 26.46 kg/m2  SpO2 97% Wt Readings from Last 3 Encounters:  03/15/15 156 lb 8 oz (70.988 kg)  03/06/15 154 lb 3.2 oz (69.945 kg)  11/08/14 152 lb (68.947 kg)     Lab Results  Component Value Date   WBC 5.8 03/12/2015   HGB 12.8* 03/12/2015   HCT 37.9* 03/12/2015   PLT 310.0 03/12/2015   GLUCOSE 102* 03/12/2015   CHOL 100  03/12/2015   TRIG 83.0 03/12/2015   HDL 33.20* 03/12/2015   LDLCALC 50 03/12/2015   ALT 23 03/12/2015   AST 26 03/12/2015   NA 136 03/12/2015   K 4.0 03/12/2015   CL 102 03/12/2015   CREATININE 0.99 03/12/2015   BUN 21 03/12/2015   CO2 26 03/12/2015   TSH 1.99 05/01/2014   INR 2.7* 03/15/2015   HGBA1C 6.2 03/15/2012    Dg Chest 1 View  03/07/2015  CLINICAL DATA:  Acute onset of shortness of breath. Initial encounter. EXAM: CHEST 1 VIEW COMPARISON:  Chest radiograph performed 03/06/2015 FINDINGS: The lungs are well-aerated. Mild vascular congestion is noted. Mild left-sided airspace opacity raises concern for pneumonia. There is no evidence of pleural effusion or pneumothorax. The cardiomediastinal silhouette is mildly enlarged. The patient is status post median sternotomy, with evidence of prior CABG. No acute osseous abnormalities are seen. IMPRESSION: Mild vascular congestion and mild cardiomegaly. Mild left-sided airspace opacity raises concern for pneumonia, given clinical concern. Electronically Signed   By: Garald Balding M.D.   On: 03/07/2015 03:33   Dg Chest 2 View  03/06/2015  CLINICAL DATA:  Cough, congestion, fever, shortness of Breath EXAM: CHEST  2 VIEW COMPARISON:  10/13/2011 FINDINGS: Prior CABG. Cardiomegaly. There is hyperinflation of the lungs compatible with COPD. No confluent airspace opacities or effusions. No acute bony abnormality. IMPRESSION: Cardiomegaly, COPD.  No active disease. Electronically Signed   By: Rolm Baptise M.D.   On: 03/06/2015 15:50   Ct Head Wo Contrast  03/06/2015  CLINICAL DATA:  Struck left face falling out of bed Friday night. Fever and congestion. EXAM: CT HEAD WITHOUT CONTRAST TECHNIQUE: Contiguous axial images were obtained from the base of the skull through the vertex without intravenous contrast. COMPARISON:  10/25/2012 FINDINGS: The brainstem, cerebellum, cerebral peduncles, thalami, basal ganglia, basilar cisterns, and ventricular system  appear within normal limits. Periventricular white matter and corona radiata hypodensities favor chronic ischemic microvascular white matter disease. Small lacunar infarcts the left lentiform nucleus, stable. Age-appropriate cerebral atrophy. Upper dental prosthesis noted. Chronic bilateral maxillary sinusitis. Mild chronic left sphenoid sinusitis. IMPRESSION: 1. No acute intracranial findings. 2. Remote small lacunar infarcts in the left lentiform nucleus. Periventricular white matter and corona radiata hypodensities favor chronic ischemic microvascular white matter disease. 3. Mild chronic bilateral maxillary sinusitis and mild chronic left sphenoid sinusitis. Electronically Signed   By: Van Clines M.D.   On: 03/06/2015 18:15       Assessment & Plan:   Problem List Items Addressed This Visit    Anemia    Recent hgb stable.  Follow.       Atrial fibrillation (  [place])    On coumadin.  Following pt/inr since treated with levaquin.  Follow.  Heart rate controlled.        Hypercholesteremia    Low cholesterol diet and exercise.  Follow lipid panel.        Hypertension    Blood pressure under good control.  Continue same medication regimen.  Follow pressures.  Follow metabolic panel.        Sepsis ([place])    Admitted with sepsis.  Treated for pneumonia.  Completed levaquin.  Follow.        Other Visit Diagnoses    Anticoagulated on Coumadin    -  Primary    Relevant Orders    Protime-INR (Completed)        [name], MD

## 2015-03-16 DIAGNOSIS — G8929 Other chronic pain: Secondary | ICD-10-CM | POA: Diagnosis not present

## 2015-03-16 DIAGNOSIS — M67912 Unspecified disorder of synovium and tendon, left shoulder: Secondary | ICD-10-CM | POA: Diagnosis not present

## 2015-03-16 DIAGNOSIS — M25512 Pain in left shoulder: Secondary | ICD-10-CM | POA: Diagnosis not present

## 2015-03-16 DIAGNOSIS — M545 Low back pain: Secondary | ICD-10-CM | POA: Diagnosis not present

## 2015-03-16 DIAGNOSIS — M4806 Spinal stenosis, lumbar region: Secondary | ICD-10-CM | POA: Diagnosis not present

## 2015-03-18 ENCOUNTER — Encounter: Payer: Self-pay | Admitting: Internal Medicine

## 2015-03-18 NOTE — Assessment & Plan Note (Signed)
Admitted with sepsis.  Treated for pneumonia.  Completed levaquin.  Follow.

## 2015-03-18 NOTE — Assessment & Plan Note (Signed)
Blood pressure under good control.  Continue same medication regimen.  Follow pressures.  Follow metabolic panel.   

## 2015-03-18 NOTE — Assessment & Plan Note (Signed)
Low cholesterol diet and exercise.  Follow lipid panel.   

## 2015-03-18 NOTE — Assessment & Plan Note (Signed)
On coumadin.  Following pt/inr since treated with levaquin.  Follow.  Heart rate controlled.

## 2015-03-18 NOTE — Assessment & Plan Note (Signed)
Recent hgb stable.  Follow.  

## 2015-03-26 ENCOUNTER — Other Ambulatory Visit: Payer: Self-pay | Admitting: Internal Medicine

## 2015-03-27 DIAGNOSIS — M7542 Impingement syndrome of left shoulder: Secondary | ICD-10-CM | POA: Diagnosis not present

## 2015-03-27 DIAGNOSIS — M4806 Spinal stenosis, lumbar region: Secondary | ICD-10-CM | POA: Diagnosis not present

## 2015-03-27 DIAGNOSIS — M545 Low back pain: Secondary | ICD-10-CM | POA: Diagnosis not present

## 2015-03-27 DIAGNOSIS — M25512 Pain in left shoulder: Secondary | ICD-10-CM | POA: Diagnosis not present

## 2015-03-27 DIAGNOSIS — M461 Sacroiliitis, not elsewhere classified: Secondary | ICD-10-CM | POA: Diagnosis not present

## 2015-03-27 DIAGNOSIS — M1990 Unspecified osteoarthritis, unspecified site: Secondary | ICD-10-CM | POA: Diagnosis not present

## 2015-03-29 DIAGNOSIS — M545 Low back pain: Secondary | ICD-10-CM | POA: Diagnosis not present

## 2015-03-29 DIAGNOSIS — M1990 Unspecified osteoarthritis, unspecified site: Secondary | ICD-10-CM | POA: Diagnosis not present

## 2015-03-29 DIAGNOSIS — M25512 Pain in left shoulder: Secondary | ICD-10-CM | POA: Diagnosis not present

## 2015-03-29 DIAGNOSIS — M4806 Spinal stenosis, lumbar region: Secondary | ICD-10-CM | POA: Diagnosis not present

## 2015-03-29 DIAGNOSIS — M7542 Impingement syndrome of left shoulder: Secondary | ICD-10-CM | POA: Diagnosis not present

## 2015-03-29 DIAGNOSIS — M461 Sacroiliitis, not elsewhere classified: Secondary | ICD-10-CM | POA: Diagnosis not present

## 2015-04-03 DIAGNOSIS — M1990 Unspecified osteoarthritis, unspecified site: Secondary | ICD-10-CM | POA: Diagnosis not present

## 2015-04-03 DIAGNOSIS — M461 Sacroiliitis, not elsewhere classified: Secondary | ICD-10-CM | POA: Diagnosis not present

## 2015-04-03 DIAGNOSIS — M4806 Spinal stenosis, lumbar region: Secondary | ICD-10-CM | POA: Diagnosis not present

## 2015-04-03 DIAGNOSIS — M545 Low back pain: Secondary | ICD-10-CM | POA: Diagnosis not present

## 2015-04-03 DIAGNOSIS — M7542 Impingement syndrome of left shoulder: Secondary | ICD-10-CM | POA: Diagnosis not present

## 2015-04-03 DIAGNOSIS — M25512 Pain in left shoulder: Secondary | ICD-10-CM | POA: Diagnosis not present

## 2015-04-05 DIAGNOSIS — M4806 Spinal stenosis, lumbar region: Secondary | ICD-10-CM | POA: Diagnosis not present

## 2015-04-05 DIAGNOSIS — M545 Low back pain: Secondary | ICD-10-CM | POA: Diagnosis not present

## 2015-04-05 DIAGNOSIS — M1990 Unspecified osteoarthritis, unspecified site: Secondary | ICD-10-CM | POA: Diagnosis not present

## 2015-04-05 DIAGNOSIS — M7542 Impingement syndrome of left shoulder: Secondary | ICD-10-CM | POA: Diagnosis not present

## 2015-04-05 DIAGNOSIS — M25512 Pain in left shoulder: Secondary | ICD-10-CM | POA: Diagnosis not present

## 2015-04-05 DIAGNOSIS — M461 Sacroiliitis, not elsewhere classified: Secondary | ICD-10-CM | POA: Diagnosis not present

## 2015-04-06 DIAGNOSIS — M659 Synovitis and tenosynovitis, unspecified: Secondary | ICD-10-CM | POA: Diagnosis not present

## 2015-04-06 DIAGNOSIS — M79672 Pain in left foot: Secondary | ICD-10-CM | POA: Diagnosis not present

## 2015-04-10 DIAGNOSIS — M545 Low back pain: Secondary | ICD-10-CM | POA: Diagnosis not present

## 2015-04-10 DIAGNOSIS — M7542 Impingement syndrome of left shoulder: Secondary | ICD-10-CM | POA: Diagnosis not present

## 2015-04-10 DIAGNOSIS — M25512 Pain in left shoulder: Secondary | ICD-10-CM | POA: Diagnosis not present

## 2015-04-10 DIAGNOSIS — M461 Sacroiliitis, not elsewhere classified: Secondary | ICD-10-CM | POA: Diagnosis not present

## 2015-04-10 DIAGNOSIS — M1990 Unspecified osteoarthritis, unspecified site: Secondary | ICD-10-CM | POA: Diagnosis not present

## 2015-04-10 DIAGNOSIS — M4806 Spinal stenosis, lumbar region: Secondary | ICD-10-CM | POA: Diagnosis not present

## 2015-04-12 DIAGNOSIS — I480 Paroxysmal atrial fibrillation: Secondary | ICD-10-CM | POA: Diagnosis not present

## 2015-04-13 ENCOUNTER — Telehealth: Payer: Self-pay | Admitting: Internal Medicine

## 2015-04-13 DIAGNOSIS — M7542 Impingement syndrome of left shoulder: Secondary | ICD-10-CM | POA: Diagnosis not present

## 2015-04-13 DIAGNOSIS — M1990 Unspecified osteoarthritis, unspecified site: Secondary | ICD-10-CM | POA: Diagnosis not present

## 2015-04-13 DIAGNOSIS — M4806 Spinal stenosis, lumbar region: Secondary | ICD-10-CM | POA: Diagnosis not present

## 2015-04-13 DIAGNOSIS — M25512 Pain in left shoulder: Secondary | ICD-10-CM | POA: Diagnosis not present

## 2015-04-13 DIAGNOSIS — M545 Low back pain: Secondary | ICD-10-CM | POA: Diagnosis not present

## 2015-04-13 DIAGNOSIS — M461 Sacroiliitis, not elsewhere classified: Secondary | ICD-10-CM | POA: Diagnosis not present

## 2015-04-13 NOTE — Telephone Encounter (Signed)
Ria Comment [phone] from [facility] called wanting to know if a appt to the neurologist is a good thing to do for pt? Due showing some signs of parkinson. Thank you!

## 2015-04-13 NOTE — Telephone Encounter (Signed)
I am ok for referral if pt agreeable.

## 2015-04-13 NOTE — Telephone Encounter (Signed)
Ria Comment notified & states his appt has been scheduled for 4/25 with Dr. Melrose Nakayama

## 2015-04-13 NOTE — Telephone Encounter (Signed)
Please advise, thanks.

## 2015-04-14 NOTE — Telephone Encounter (Signed)
I will place the order.  Just need the specific symptoms concerned about so that I can associate with diagnosis.  Thanks

## 2015-04-16 NOTE — Telephone Encounter (Signed)
She stated that a referral was not needed. They have already made the appt with [facility]

## 2015-04-18 DIAGNOSIS — M4806 Spinal stenosis, lumbar region: Secondary | ICD-10-CM | POA: Diagnosis not present

## 2015-04-18 DIAGNOSIS — M25512 Pain in left shoulder: Secondary | ICD-10-CM | POA: Diagnosis not present

## 2015-04-18 DIAGNOSIS — M1990 Unspecified osteoarthritis, unspecified site: Secondary | ICD-10-CM | POA: Diagnosis not present

## 2015-04-18 DIAGNOSIS — M545 Low back pain: Secondary | ICD-10-CM | POA: Diagnosis not present

## 2015-04-18 DIAGNOSIS — M461 Sacroiliitis, not elsewhere classified: Secondary | ICD-10-CM | POA: Diagnosis not present

## 2015-04-18 DIAGNOSIS — M7542 Impingement syndrome of left shoulder: Secondary | ICD-10-CM | POA: Diagnosis not present

## 2015-04-20 DIAGNOSIS — M4806 Spinal stenosis, lumbar region: Secondary | ICD-10-CM | POA: Diagnosis not present

## 2015-04-20 DIAGNOSIS — M7542 Impingement syndrome of left shoulder: Secondary | ICD-10-CM | POA: Diagnosis not present

## 2015-04-20 DIAGNOSIS — M1990 Unspecified osteoarthritis, unspecified site: Secondary | ICD-10-CM | POA: Diagnosis not present

## 2015-04-20 DIAGNOSIS — M545 Low back pain: Secondary | ICD-10-CM | POA: Diagnosis not present

## 2015-04-20 DIAGNOSIS — M461 Sacroiliitis, not elsewhere classified: Secondary | ICD-10-CM | POA: Diagnosis not present

## 2015-04-20 DIAGNOSIS — M25512 Pain in left shoulder: Secondary | ICD-10-CM | POA: Diagnosis not present

## 2015-04-22 ENCOUNTER — Other Ambulatory Visit: Payer: Self-pay | Admitting: Internal Medicine

## 2015-04-25 ENCOUNTER — Other Ambulatory Visit: Payer: Self-pay | Admitting: Orthopedic Surgery

## 2015-04-25 DIAGNOSIS — M48061 Spinal stenosis, lumbar region without neurogenic claudication: Secondary | ICD-10-CM

## 2015-04-27 DIAGNOSIS — M5127 Other intervertebral disc displacement, lumbosacral region: Secondary | ICD-10-CM | POA: Diagnosis not present

## 2015-04-27 DIAGNOSIS — M47816 Spondylosis without myelopathy or radiculopathy, lumbar region: Secondary | ICD-10-CM | POA: Diagnosis not present

## 2015-04-27 DIAGNOSIS — M4806 Spinal stenosis, lumbar region: Secondary | ICD-10-CM | POA: Diagnosis not present

## 2015-05-08 DIAGNOSIS — R251 Tremor, unspecified: Secondary | ICD-10-CM | POA: Diagnosis not present

## 2015-05-08 DIAGNOSIS — M9983 Other biomechanical lesions of lumbar region: Secondary | ICD-10-CM | POA: Diagnosis not present

## 2015-05-08 DIAGNOSIS — R262 Difficulty in walking, not elsewhere classified: Secondary | ICD-10-CM | POA: Diagnosis not present

## 2015-05-08 DIAGNOSIS — R498 Other voice and resonance disorders: Secondary | ICD-10-CM | POA: Diagnosis not present

## 2015-05-10 DIAGNOSIS — I480 Paroxysmal atrial fibrillation: Secondary | ICD-10-CM | POA: Diagnosis not present

## 2015-05-16 ENCOUNTER — Ambulatory Visit: Payer: Medicare HMO

## 2015-05-28 ENCOUNTER — Encounter: Payer: Self-pay | Admitting: Internal Medicine

## 2015-05-28 ENCOUNTER — Ambulatory Visit (INDEPENDENT_AMBULATORY_CARE_PROVIDER_SITE_OTHER): Payer: Medicare HMO | Admitting: Internal Medicine

## 2015-05-28 VITALS — BP 110/62 | HR 56 | Temp 98.2°F | Resp 18 | Ht 64.5 in | Wt 145.2 lb

## 2015-05-28 DIAGNOSIS — R9389 Abnormal findings on diagnostic imaging of other specified body structures: Secondary | ICD-10-CM

## 2015-05-28 DIAGNOSIS — I480 Paroxysmal atrial fibrillation: Secondary | ICD-10-CM | POA: Diagnosis not present

## 2015-05-28 DIAGNOSIS — R938 Abnormal findings on diagnostic imaging of other specified body structures: Secondary | ICD-10-CM | POA: Diagnosis not present

## 2015-05-28 DIAGNOSIS — I2510 Atherosclerotic heart disease of native coronary artery without angina pectoris: Secondary | ICD-10-CM

## 2015-05-28 DIAGNOSIS — I4891 Unspecified atrial fibrillation: Secondary | ICD-10-CM

## 2015-05-28 DIAGNOSIS — I10 Essential (primary) hypertension: Secondary | ICD-10-CM

## 2015-05-28 DIAGNOSIS — E7800 Pure hypercholesterolemia, unspecified: Secondary | ICD-10-CM

## 2015-05-28 DIAGNOSIS — D508 Other iron deficiency anemias: Secondary | ICD-10-CM | POA: Diagnosis not present

## 2015-05-28 DIAGNOSIS — R251 Tremor, unspecified: Secondary | ICD-10-CM

## 2015-05-28 DIAGNOSIS — R634 Abnormal weight loss: Secondary | ICD-10-CM

## 2015-05-28 NOTE — Progress Notes (Signed)
[name]: Tyler Stout, male   DOB: [birth year], 82 y.o.   MRN: [id number]   Subjective:    [name]: Tyler Stout, male    DOB: September 26, 1925, 82 y.o.   MRN: [id number]  HPI  Patient here for a scheduled follow up.  He has lost weight.  Wife accompanies him today.  History obtained from both of them.  States he is eating.  Appetite is good.  No abdominal pain or cramping.  Breathing stable.  Some question of acid reflux.  On protonix.  Saw neurology for tremor.  Gave him a trial of sinemet.  He stopped.  Did not see any benefit.  Overall feels things are stable.     Past Medical History  Diagnosis Date  . CAD (coronary artery disease)     s/p bypass graft x 4 (1991)  . GERD (gastroesophageal reflux disease)   . Atrial fibrillation ([place])   . Anemia     iron deficiency  . Hypertension   . Hypercholesterolemia   . Pancreatitis     x2 (1994 and 1998), presumed to be gallstone pancreatitis.  s/p ERCP and sphincterotomy  . Bilateral renal cysts     worked up by Dr Bernardo Heater  . Glaucoma   . BPH (benign prostatic hypertrophy)   . Diverticulosis   . Arthritis    Past Surgical History  Procedure Laterality Date  . Appendectomy    . Coronary artery bypass graft      x 4,  1991  . Cholecystectomy  1994  . Inguinal hernia repair  2009  . Open heart surgery  1991   Family History  Problem Relation Age of Onset  . Throat cancer Father     oropharyngeal cancer  . Kidney disease Sister   . Heart disease    . Colon cancer Neg Hx   . Prostate cancer Neg Hx    Social History   Social History  . Marital Status: Married    Spouse Name: N/A  . Number of Children: N/A  . Years of Education: N/A   Social History Main Topics  . Smoking status: Former Smoker    Quit date: 01/14/1964  . Smokeless tobacco: Never Used  . Alcohol Use: No  . Drug Use: No  . Sexual Activity: Not Asked   Other Topics Concern  . None   Social History Narrative    No facility-administered  encounter medications on file as of 05/28/2015.   Outpatient Encounter Prescriptions as of 05/28/2015  Medication Sig  . atorvastatin (LIPITOR) 20 MG tablet Take 20 mg by mouth at bedtime.  . Calcium Carbonate-Vitamin D (CALCIUM 600+D) 600-400 MG-UNIT per tablet Take 1 tablet by mouth daily. Reported on 06/09/2015  . HYDROcodone-acetaminophen (NORCO/VICODIN) 5-325 MG tablet Take 1 tablet by mouth every 6 (six) hours as needed for moderate pain. Reported on 06/09/2015  . latanoprost (XALATAN) 0.005 % ophthalmic solution Place 1 drop into both eyes at bedtime.   . tamsulosin (FLOMAX) 0.4 MG CAPS capsule Take 0.4 mg by mouth daily after supper.   . [DISCONTINUED] acetaminophen (TYLENOL) 500 MG tablet Take 1,000 mg by mouth 2 (two) times daily.  . [DISCONTINUED] diltiazem (CARDIZEM CD) 240 MG 24 hr capsule Take 240 mg by mouth at bedtime.  . [DISCONTINUED] furosemide (LASIX) 20 MG tablet Take 10 mg by mouth daily.   . [DISCONTINUED] metoprolol tartrate (LOPRESSOR) 25 MG tablet Take 25 mg by mouth 2 (two) times daily.  . [DISCONTINUED] Multiple Vitamin (MULTIVITAMIN WITH  MINERALS) TABS tablet Take 1 tablet by mouth daily.  . [DISCONTINUED] Omega-3 Fatty Acids (FISH OIL) 1000 MG CAPS Take 1,000 mg by mouth daily.  . [DISCONTINUED] pantoprazole (PROTONIX) 40 MG tablet TAKE 1 TABLET BY MOUTH DAILY (Patient not taking: Reported on 06/08/2015)  . [DISCONTINUED] tamsulosin (FLOMAX) 0.4 MG CAPS capsule TAKE 1 CAPSULE (0.4 MG TOTAL) BY MOUTH DAILY. (Patient not taking: Reported on 06/08/2015)  . [DISCONTINUED] vitamin C (ASCORBIC ACID) 500 MG tablet Take 500 mg by mouth daily.  . [DISCONTINUED] warfarin (COUMADIN) 2 MG tablet Take 2 mg by mouth every evening. Pt takes with one-half of a 5mg  tablet.  . [DISCONTINUED] warfarin (COUMADIN) 5 MG tablet Take 2.5 mg by mouth every evening. Pt takes with a 2mg  tablet.    Review of Systems  Constitutional: Negative for fever.       Has lost weight.  Is eating.    HENT:  Negative for congestion and sinus pressure.   Respiratory: Negative for cough, chest tightness and shortness of breath.   Cardiovascular: Negative for chest pain, palpitations and leg swelling.  Gastrointestinal: Negative for nausea, vomiting, abdominal pain and diarrhea.  Genitourinary: Negative for dysuria and difficulty urinating.  Musculoskeletal: Negative for myalgias and joint swelling.  Skin: Negative for color change and rash.  Neurological: Negative for dizziness, light-headedness and headaches.  Psychiatric/Behavioral: Negative for dysphoric mood and agitation.       Objective:    Physical Exam  Constitutional: He appears well-developed and well-nourished. No distress.  HENT:  Nose: Nose normal.  Mouth/Throat: Oropharynx is clear and moist.  Neck: Neck supple. No thyromegaly present.  Cardiovascular: Normal rate.   Irregular rhythm.  Rate controlled.    Pulmonary/Chest: Effort normal and breath sounds normal. No respiratory distress.  Abdominal: Soft. Bowel sounds are normal. There is no tenderness.  Musculoskeletal: He exhibits no edema or tenderness.  Lymphadenopathy:    He has no cervical adenopathy.  Skin: No rash noted. No erythema.  Psychiatric: He has a normal mood and affect. His behavior is normal.    BP 110/62 mmHg  Pulse 56  Temp(Src) 98.2 F (36.8 C) (Oral)  Resp 18  Ht 5' 4.5" (1.638 m)  Wt 145 lb 4 oz (65.885 kg)  BMI 24.56 kg/m2  SpO2 94% Wt Readings from Last 3 Encounters:  06/10/15 149 lb 11.1 oz (67.9 kg)  06/08/15 140 lb (63.504 kg)  05/28/15 145 lb 4 oz (65.885 kg)     Lab Results  Component Value Date   WBC 10.2 06/10/2015   HGB 7.7* 06/10/2015   HCT 22.2* 06/10/2015   PLT 74* 06/10/2015   GLUCOSE 97 06/10/2015   CHOL 100 03/12/2015   TRIG 83.0 03/12/2015   HDL 33.20* 03/12/2015   LDLCALC 50 03/12/2015   ALT 21 06/08/2015   AST 27 06/08/2015   NA 139 06/10/2015   K 2.6* 06/10/2015   CL 113* 06/10/2015   CREATININE 0.61  06/10/2015   BUN <5* 06/10/2015   CO2 23 06/10/2015   TSH 1.99 05/01/2014   INR 1.43 06/10/2015   HGBA1C 6.2 03/15/2012    Dg Chest 1 View  03/07/2015  CLINICAL DATA:  Acute onset of shortness of breath. Initial encounter. EXAM: CHEST 1 VIEW COMPARISON:  Chest radiograph performed 03/06/2015 FINDINGS: The lungs are well-aerated. Mild vascular congestion is noted. Mild left-sided airspace opacity raises concern for pneumonia. There is no evidence of pleural effusion or pneumothorax. The cardiomediastinal silhouette is mildly enlarged. The patient is status post median  sternotomy, with evidence of prior CABG. No acute osseous abnormalities are seen. IMPRESSION: Mild vascular congestion and mild cardiomegaly. Mild left-sided airspace opacity raises concern for pneumonia, given clinical concern. Electronically Signed   By: Garald Balding M.D.   On: 03/07/2015 03:33   Dg Chest 2 View  03/06/2015  CLINICAL DATA:  Cough, congestion, fever, shortness of Breath EXAM: CHEST  2 VIEW COMPARISON:  10/13/2011 FINDINGS: Prior CABG. Cardiomegaly. There is hyperinflation of the lungs compatible with COPD. No confluent airspace opacities or effusions. No acute bony abnormality. IMPRESSION: Cardiomegaly, COPD.  No active disease. Electronically Signed   By: Rolm Baptise M.D.   On: 03/06/2015 15:50   Ct Head Wo Contrast  03/06/2015  CLINICAL DATA:  Struck left face falling out of bed Friday night. Fever and congestion. EXAM: CT HEAD WITHOUT CONTRAST TECHNIQUE: Contiguous axial images were obtained from the base of the skull through the vertex without intravenous contrast. COMPARISON:  10/25/2012 FINDINGS: The brainstem, cerebellum, cerebral peduncles, thalami, basal ganglia, basilar cisterns, and ventricular system appear within normal limits. Periventricular white matter and corona radiata hypodensities favor chronic ischemic microvascular white matter disease. Small lacunar infarcts the left lentiform nucleus, stable.  Age-appropriate cerebral atrophy. Upper dental prosthesis noted. Chronic bilateral maxillary sinusitis. Mild chronic left sphenoid sinusitis. IMPRESSION: 1. No acute intracranial findings. 2. Remote small lacunar infarcts in the left lentiform nucleus. Periventricular white matter and corona radiata hypodensities favor chronic ischemic microvascular white matter disease. 3. Mild chronic bilateral maxillary sinusitis and mild chronic left sphenoid sinusitis. Electronically Signed   By: Van Clines M.D.   On: 03/06/2015 18:15       Assessment & Plan:   Problem List Items Addressed This Visit    Anemia    Most recent hgb 12.7.  Had GI w/up previously.  Currently doing well.  Follow cbc.  On coumadin.        Atrial fibrillation ([place])    Rate controlled.  On coumadin.  Followed by cardiology.       CAD (coronary artery disease)    Followed by cardiology.  Continue risk factor modification.       Hypercholesteremia    On lipitor.  Low cholesterol diet and exercise.  Follow lipid panel and liver function tests.        Hypertension    Blood pressure as outlined.  Has been doing well.  Same medication regimen.  Follow pressures.  Follow metabolic panel.       Loss of weight    Discussed with the patient and his wife today.  He is eating now.  Good appetite.  No nausea or vomiting.  Has previously had GI w/up.  Did report some acid reflux.  Add zantac.  Continue protonix.  May need further GI w/up.        Tremor    Saw neurology.  Started on sinemet.  Felt did not help.  Follow.  Keep f/u with neurology.        Other Visit Diagnoses    Abnormal CXR    -  Primary    Relevant Orders    DG Chest 2 View (Completed)        [name], MD

## 2015-05-28 NOTE — Progress Notes (Signed)
Pre-visit discussion using our clinic review tool. No additional management support is needed unless otherwise documented below in the visit note.  

## 2015-05-28 NOTE — Patient Instructions (Signed)
Zantac (ranitidine) 150mg  - take one tablet 30 minutes before evening meal.  Continue protonix in am as you are doing.

## 2015-05-29 DIAGNOSIS — M5416 Radiculopathy, lumbar region: Secondary | ICD-10-CM | POA: Diagnosis not present

## 2015-05-29 DIAGNOSIS — M5136 Other intervertebral disc degeneration, lumbar region: Secondary | ICD-10-CM | POA: Diagnosis not present

## 2015-05-29 DIAGNOSIS — M4806 Spinal stenosis, lumbar region: Secondary | ICD-10-CM | POA: Diagnosis not present

## 2015-06-05 ENCOUNTER — Ambulatory Visit
Admission: RE | Admit: 2015-06-05 | Discharge: 2015-06-05 | Disposition: A | Discharge status: Home / Self Care | Payer: Medicare HMO | Source: Ambulatory Visit | Attending: Internal Medicine | Admitting: Internal Medicine

## 2015-06-05 DIAGNOSIS — R251 Tremor, unspecified: Secondary | ICD-10-CM | POA: Diagnosis not present

## 2015-06-05 DIAGNOSIS — I951 Orthostatic hypotension: Secondary | ICD-10-CM | POA: Diagnosis not present

## 2015-06-05 DIAGNOSIS — J189 Pneumonia, unspecified organism: Secondary | ICD-10-CM | POA: Diagnosis not present

## 2015-06-05 DIAGNOSIS — R262 Difficulty in walking, not elsewhere classified: Secondary | ICD-10-CM | POA: Diagnosis not present

## 2015-06-05 DIAGNOSIS — R938 Abnormal findings on diagnostic imaging of other specified body structures: Secondary | ICD-10-CM | POA: Insufficient documentation

## 2015-06-05 DIAGNOSIS — R9389 Abnormal findings on diagnostic imaging of other specified body structures: Secondary | ICD-10-CM

## 2015-06-08 ENCOUNTER — Telehealth: Payer: Self-pay | Admitting: Internal Medicine

## 2015-06-08 ENCOUNTER — Inpatient Hospital Stay
Admit: 2015-06-08 | Discharge: 2015-06-08 | Disposition: A | Discharge status: Home / Self Care | Payer: Medicare HMO | Attending: Internal Medicine | Admitting: Internal Medicine

## 2015-06-08 ENCOUNTER — Inpatient Hospital Stay
Admission: EM | Admit: 2015-06-08 | Discharge: 2015-06-09 | DRG: 377 | Disposition: A | Discharge status: Other Acute Inpatient Hospital | Payer: Medicare HMO | Attending: Internal Medicine | Admitting: Internal Medicine

## 2015-06-08 ENCOUNTER — Emergency Department: Payer: Medicare HMO

## 2015-06-08 ENCOUNTER — Encounter: Payer: Self-pay | Admitting: Emergency Medicine

## 2015-06-08 ENCOUNTER — Other Ambulatory Visit (HOSPITAL_COMMUNITY): Payer: Medicare HMO

## 2015-06-08 ENCOUNTER — Inpatient Hospital Stay: Payer: Medicare HMO

## 2015-06-08 DIAGNOSIS — I959 Hypotension, unspecified: Secondary | ICD-10-CM

## 2015-06-08 DIAGNOSIS — Z880 Allergy status to penicillin: Secondary | ICD-10-CM

## 2015-06-08 DIAGNOSIS — I480 Paroxysmal atrial fibrillation: Secondary | ICD-10-CM | POA: Diagnosis not present

## 2015-06-08 DIAGNOSIS — Z951 Presence of aortocoronary bypass graft: Secondary | ICD-10-CM | POA: Diagnosis not present

## 2015-06-08 DIAGNOSIS — K5793 Diverticulitis of intestine, part unspecified, without perforation or abscess with bleeding: Principal | ICD-10-CM | POA: Diagnosis present

## 2015-06-08 DIAGNOSIS — R000 Tachycardia, unspecified: Secondary | ICD-10-CM | POA: Diagnosis present

## 2015-06-08 DIAGNOSIS — Z7901 Long term (current) use of anticoagulants: Secondary | ICD-10-CM

## 2015-06-08 DIAGNOSIS — K625 Hemorrhage of anus and rectum: Secondary | ICD-10-CM | POA: Diagnosis not present

## 2015-06-08 DIAGNOSIS — N400 Enlarged prostate without lower urinary tract symptoms: Secondary | ICD-10-CM | POA: Diagnosis present

## 2015-06-08 DIAGNOSIS — K2971 Gastritis, unspecified, with bleeding: Secondary | ICD-10-CM | POA: Diagnosis present

## 2015-06-08 DIAGNOSIS — Z881 Allergy status to other antibiotic agents status: Secondary | ICD-10-CM

## 2015-06-08 DIAGNOSIS — R571 Hypovolemic shock: Secondary | ICD-10-CM | POA: Diagnosis not present

## 2015-06-08 DIAGNOSIS — I2510 Atherosclerotic heart disease of native coronary artery without angina pectoris: Secondary | ICD-10-CM | POA: Diagnosis present

## 2015-06-08 DIAGNOSIS — K922 Gastrointestinal hemorrhage, unspecified: Secondary | ICD-10-CM | POA: Diagnosis not present

## 2015-06-08 DIAGNOSIS — D62 Acute posthemorrhagic anemia: Secondary | ICD-10-CM | POA: Diagnosis not present

## 2015-06-08 DIAGNOSIS — E785 Hyperlipidemia, unspecified: Secondary | ICD-10-CM | POA: Diagnosis present

## 2015-06-08 DIAGNOSIS — Z841 Family history of disorders of kidney and ureter: Secondary | ICD-10-CM

## 2015-06-08 DIAGNOSIS — K219 Gastro-esophageal reflux disease without esophagitis: Secondary | ICD-10-CM | POA: Diagnosis present

## 2015-06-08 DIAGNOSIS — I10 Essential (primary) hypertension: Secondary | ICD-10-CM | POA: Diagnosis not present

## 2015-06-08 DIAGNOSIS — K5791 Diverticulosis of intestine, part unspecified, without perforation or abscess with bleeding: Secondary | ICD-10-CM | POA: Diagnosis not present

## 2015-06-08 DIAGNOSIS — I482 Chronic atrial fibrillation: Secondary | ICD-10-CM | POA: Diagnosis not present

## 2015-06-08 DIAGNOSIS — Z79899 Other long term (current) drug therapy: Secondary | ICD-10-CM

## 2015-06-08 DIAGNOSIS — Z808 Family history of malignant neoplasm of other organs or systems: Secondary | ICD-10-CM

## 2015-06-08 DIAGNOSIS — H409 Unspecified glaucoma: Secondary | ICD-10-CM | POA: Diagnosis present

## 2015-06-08 DIAGNOSIS — Z87891 Personal history of nicotine dependence: Secondary | ICD-10-CM

## 2015-06-08 DIAGNOSIS — K5732 Diverticulitis of large intestine without perforation or abscess without bleeding: Secondary | ICD-10-CM | POA: Diagnosis not present

## 2015-06-08 LAB — CBC WITH DIFFERENTIAL/PLATELET
BASOS ABS: 0 10*3/uL (ref 0–0.1)
Basophils Relative: 1 %
Eosinophils Absolute: 0 10*3/uL (ref 0–0.7)
Eosinophils Relative: 0 %
HEMATOCRIT: 26.1 % — AB (ref 40.0–52.0)
HEMOGLOBIN: 8.7 g/dL — AB (ref 13.0–18.0)
Lymphs Abs: 1.2 10*3/uL (ref 1.0–3.6)
MCH: 30.6 pg (ref 26.0–34.0)
MCHC: 33.5 g/dL (ref 32.0–36.0)
MCV: 91.3 fL (ref 80.0–100.0)
MONO ABS: 1.2 10*3/uL — AB (ref 0.2–1.0)
Monocytes Relative: 12 %
NEUTROS ABS: 7.6 10*3/uL — AB (ref 1.4–6.5)
Platelets: 187 10*3/uL (ref 150–440)
RBC: 2.86 MIL/uL — AB (ref 4.40–5.90)
RDW: 15.2 % — AB (ref 11.5–14.5)
WBC: 10 10*3/uL (ref 3.8–10.6)

## 2015-06-08 LAB — PROTIME-INR
INR: 2.7
INR: 2.79
PROTHROMBIN TIME: 28.3 s — AB (ref 11.4–15.0)
PROTHROMBIN TIME: 29 s — AB (ref 11.4–15.0)

## 2015-06-08 LAB — COMPREHENSIVE METABOLIC PANEL
ALT: 21 U/L (ref 17–63)
AST: 27 U/L (ref 15–41)
Albumin: 3.7 g/dL (ref 3.5–5.0)
Alkaline Phosphatase: 69 U/L (ref 38–126)
Anion gap: 8 (ref 5–15)
BILIRUBIN TOTAL: 0.8 mg/dL (ref 0.3–1.2)
BUN: 20 mg/dL (ref 6–20)
CHLORIDE: 103 mmol/L (ref 101–111)
CO2: 25 mmol/L (ref 22–32)
CREATININE: 1.04 mg/dL (ref 0.61–1.24)
Calcium: 8.7 mg/dL — ABNORMAL LOW (ref 8.9–10.3)
GFR calc Af Amer: >60 mL/min (ref >60)
Glucose, Bld: 185 mg/dL — ABNORMAL HIGH (ref 65–99)
Potassium: 4.2 mmol/L (ref 3.5–5.1)
Sodium: 136 mmol/L (ref 135–145)
Total Protein: 6.6 g/dL (ref 6.5–8.1)

## 2015-06-08 LAB — CBC
HCT: 36.3 % — ABNORMAL LOW (ref 40.0–52.0)
Hemoglobin: 12.2 g/dL — ABNORMAL LOW (ref 13.0–18.0)
MCH: 30.4 pg (ref 26.0–34.0)
MCHC: 33.8 g/dL (ref 32.0–36.0)
MCV: 89.9 fL (ref 80.0–100.0)
PLATELETS: 215 10*3/uL (ref 150–440)
RBC: 4.03 MIL/uL — ABNORMAL LOW (ref 4.40–5.90)
RDW: 15.2 % — AB (ref 11.5–14.5)
WBC: 14.2 10*3/uL — AB (ref 3.8–10.6)

## 2015-06-08 LAB — GLUCOSE, CAPILLARY: Glucose-Capillary: 159 mg/dL — ABNORMAL HIGH (ref 65–99)

## 2015-06-08 LAB — PREPARE RBC (CROSSMATCH)

## 2015-06-08 LAB — ABO/RH: ABO/RH(D): O POS

## 2015-06-08 LAB — HEMOGLOBIN: Hemoglobin: 7.7 g/dL — ABNORMAL LOW (ref 13.0–18.0)

## 2015-06-08 LAB — MRSA PCR SCREENING: MRSA by PCR: NEGATIVE

## 2015-06-08 MED ORDER — LATANOPROST 0.005 % OP SOLN
1.0000 [drp] | Freq: Every day | OPHTHALMIC | Status: DC
  Filled 2015-06-08: qty 2.5

## 2015-06-08 MED ORDER — SODIUM CHLORIDE 0.9 % IV SOLN
Freq: Once | INTRAVENOUS | Status: AC
  Administered 2015-06-08: 23:00:00 via INTRAVENOUS

## 2015-06-08 MED ORDER — VITAMIN C 500 MG PO TABS
500.0000 mg | ORAL_TABLET | Freq: Every day | ORAL | Status: DC
  Filled 2015-06-08: qty 1

## 2015-06-08 MED ORDER — SODIUM CHLORIDE 0.9 % IV SOLN
Freq: Once | INTRAVENOUS | Status: AC
  Administered 2015-06-08: 21:00:00 via INTRAVENOUS

## 2015-06-08 MED ORDER — VITAMIN K1 10 MG/ML IJ SOLN
5.0000 mg | Freq: Once | INTRAMUSCULAR | Status: AC
  Administered 2015-06-08: 5 mg via SUBCUTANEOUS
  Filled 2015-06-08: qty 1

## 2015-06-08 MED ORDER — ACETAMINOPHEN 325 MG PO TABS: 650.0000 mg | ORAL_TABLET | Freq: Four times a day (QID) | ORAL | Status: DC | PRN

## 2015-06-08 MED ORDER — IOPAMIDOL (ISOVUE-300) INJECTION 61%
100.0000 mL | Freq: Once | INTRAVENOUS | Status: AC | PRN
Start: 2015-06-08 — End: 2015-06-08
  Administered 2015-06-08: 100 mL via INTRAVENOUS

## 2015-06-08 MED ORDER — ONDANSETRON HCL 4 MG/2ML IJ SOLN
4.0000 mg | Freq: Four times a day (QID) | INTRAMUSCULAR | Status: DC | PRN
  Administered 2015-06-08 – 2015-06-09 (×2): 4 mg via INTRAVENOUS
  Filled 2015-06-08 (×2): qty 2

## 2015-06-08 MED ORDER — METOPROLOL TARTRATE 25 MG PO TABS: 25.0000 mg | ORAL_TABLET | Freq: Two times a day (BID) | ORAL | Status: DC

## 2015-06-08 MED ORDER — CIPROFLOXACIN IN D5W 400 MG/200ML IV SOLN
400.0000 mg | Freq: Once | INTRAVENOUS | Status: AC
  Administered 2015-06-08: 400 mg via INTRAVENOUS
  Filled 2015-06-08: qty 200

## 2015-06-08 MED ORDER — CALCIUM CARBONATE-VITAMIN D 500-200 MG-UNIT PO TABS
1.0000 | ORAL_TABLET | Freq: Every day | ORAL | Status: DC
  Filled 2015-06-08: qty 1

## 2015-06-08 MED ORDER — OXYCODONE HCL 5 MG PO TABS: 5.0000 mg | ORAL_TABLET | ORAL | Status: DC | PRN

## 2015-06-08 MED ORDER — ADULT MULTIVITAMIN W/MINERALS CH: 1.0000 | ORAL_TABLET | Freq: Every day | ORAL | Status: DC

## 2015-06-08 MED ORDER — SODIUM CHLORIDE 0.9 % IV SOLN
INTRAVENOUS | Status: DC
  Administered 2015-06-08: 22:00:00 via INTRAVENOUS

## 2015-06-08 MED ORDER — TAMSULOSIN HCL 0.4 MG PO CAPS
0.4000 mg | ORAL_CAPSULE | Freq: Every day | ORAL | Status: DC
  Filled 2015-06-08: qty 1

## 2015-06-08 MED ORDER — ONDANSETRON HCL 4 MG/2ML IJ SOLN
INTRAMUSCULAR | Status: AC
  Administered 2015-06-08: 4 mg via INTRAVENOUS
  Filled 2015-06-08: qty 2

## 2015-06-08 MED ORDER — MORPHINE SULFATE (PF) 2 MG/ML IV SOLN
1.0000 mg | INTRAVENOUS | Status: DC | PRN
  Filled 2015-06-08: qty 1

## 2015-06-08 MED ORDER — SODIUM CHLORIDE 0.9 % IV SOLN
Freq: Once | INTRAVENOUS | Status: AC
  Administered 2015-06-08: 18:00:00 via INTRAVENOUS

## 2015-06-08 MED ORDER — DILTIAZEM HCL ER COATED BEADS 240 MG PO CP24: 240.0000 mg | ORAL_CAPSULE | Freq: Every day | ORAL | Status: DC

## 2015-06-08 MED ORDER — HYDROCODONE-ACETAMINOPHEN 5-325 MG PO TABS
1.0000 | ORAL_TABLET | Freq: Four times a day (QID) | ORAL | Status: DC | PRN
  Filled 2015-06-08: qty 1

## 2015-06-08 MED ORDER — ONDANSETRON HCL 4 MG PO TABS: 4.0000 mg | ORAL_TABLET | Freq: Four times a day (QID) | ORAL | Status: DC | PRN

## 2015-06-08 MED ORDER — SODIUM CHLORIDE 0.9 % IV BOLUS (SEPSIS)
500.0000 mL | Freq: Once | INTRAVENOUS | Status: AC
  Administered 2015-06-08: 500 mL via INTRAVENOUS

## 2015-06-08 MED ORDER — PANTOPRAZOLE SODIUM 40 MG IV SOLR
40.0000 mg | Freq: Once | INTRAVENOUS | Status: AC
  Administered 2015-06-08: 40 mg via INTRAVENOUS
  Filled 2015-06-08: qty 40

## 2015-06-08 MED ORDER — DIATRIZOATE MEGLUMINE & SODIUM 66-10 % PO SOLN
15.0000 mL | Freq: Once | ORAL | Status: AC
  Administered 2015-06-08: 15 mL via ORAL

## 2015-06-08 MED ORDER — SODIUM CHLORIDE 0.9 % IV SOLN: Freq: Once | INTRAVENOUS | Status: DC

## 2015-06-08 MED ORDER — MORPHINE SULFATE (PF) 2 MG/ML IV SOLN: 1.0000 mg | INTRAVENOUS | Status: DC | PRN

## 2015-06-08 MED ORDER — TECHNETIUM TC 99M-LABELED RED BLOOD CELLS IV KIT
20.0000 | PACK | Freq: Once | INTRAVENOUS | Status: AC | PRN
  Administered 2015-06-08: 22.47 via INTRAVENOUS

## 2015-06-08 MED ORDER — TAMSULOSIN HCL 0.4 MG PO CAPS: 0.4000 mg | ORAL_CAPSULE | Freq: Every day | ORAL | Status: DC

## 2015-06-08 MED ORDER — METRONIDAZOLE IN NACL 5-0.79 MG/ML-% IV SOLN: 500.0000 mg | Freq: Three times a day (TID) | INTRAVENOUS | Status: DC

## 2015-06-08 MED ORDER — CIPROFLOXACIN IN D5W 400 MG/200ML IV SOLN: 400.0000 mg | Freq: Two times a day (BID) | INTRAVENOUS | Status: DC

## 2015-06-08 MED ORDER — OMEGA-3-ACID ETHYL ESTERS 1 G PO CAPS
1.0000 g | ORAL_CAPSULE | Freq: Every day | ORAL | Status: DC
  Filled 2015-06-08: qty 1

## 2015-06-08 MED ORDER — ONDANSETRON HCL 4 MG/2ML IJ SOLN
4.0000 mg | Freq: Once | INTRAMUSCULAR | Status: AC
  Administered 2015-06-08: 4 mg via INTRAVENOUS

## 2015-06-08 MED ORDER — ACETAMINOPHEN 650 MG RE SUPP: 650.0000 mg | Freq: Four times a day (QID) | RECTAL | Status: DC | PRN

## 2015-06-08 MED ORDER — CIPROFLOXACIN IN D5W 400 MG/200ML IV SOLN
400.0000 mg | Freq: Two times a day (BID) | INTRAVENOUS | Status: DC
  Administered 2015-06-08: 400 mg via INTRAVENOUS
  Filled 2015-06-08 (×2): qty 200

## 2015-06-08 MED ORDER — METRONIDAZOLE IN NACL 5-0.79 MG/ML-% IV SOLN
500.0000 mg | Freq: Once | INTRAVENOUS | Status: AC
Start: 2015-06-08 — End: 2015-06-08
  Administered 2015-06-08: 500 mg via INTRAVENOUS
  Filled 2015-06-08: qty 100

## 2015-06-08 MED ORDER — MORPHINE SULFATE (PF) 2 MG/ML IV SOLN
1.0000 mg | INTRAVENOUS | Status: DC | PRN
  Administered 2015-06-08: 2 mg via INTRAVENOUS

## 2015-06-08 MED ORDER — NOREPINEPHRINE BITARTRATE 1 MG/ML IV SOLN: 0.0000 ug/min | INTRAVENOUS | Status: DC

## 2015-06-08 MED ORDER — METRONIDAZOLE IN NACL 5-0.79 MG/ML-% IV SOLN
500.0000 mg | Freq: Three times a day (TID) | INTRAVENOUS | Status: DC
Start: 2015-06-08 — End: 2015-06-09
  Administered 2015-06-08: 500 mg via INTRAVENOUS
  Filled 2015-06-08 (×3): qty 100

## 2015-06-08 MED ORDER — DEXTROSE 5 % IV SOLN
0.0000 ug/min | INTRAVENOUS | Status: DC
  Administered 2015-06-08: 5 ug/min via INTRAVENOUS
  Administered 2015-06-08: 2 ug/min via INTRAVENOUS
  Administered 2015-06-08: 25 ug/min via INTRAVENOUS
  Filled 2015-06-08: qty 4

## 2015-06-08 MED ORDER — ATORVASTATIN CALCIUM 20 MG PO TABS
20.0000 mg | ORAL_TABLET | Freq: Every day | ORAL | Status: DC
  Filled 2015-06-08: qty 1

## 2015-06-08 MED ORDER — PANTOPRAZOLE SODIUM 40 MG PO TBEC: 40.0000 mg | DELAYED_RELEASE_TABLET | Freq: Every day | ORAL | Status: DC

## 2015-06-08 NOTE — Telephone Encounter (Signed)
Pam with Dr. Bethanne Ginger office called to inform Dr. Nicki Reaper that they transported the patient to [facility].

## 2015-06-08 NOTE — ED Notes (Signed)
Pt ambulated to bathroom x assist of 1 with no problems, NAD noted.

## 2015-06-08 NOTE — ED Notes (Signed)
Pt resting during scan but responds to voice at this time. Pt reports only pain is in his back at this time. Pt remains alert and oriented.

## 2015-06-08 NOTE — ED Notes (Addendum)
Pt experienced 2 large bright red blood BM's at this time that filled 2 entire bed pan's. MD Posey Pronto paged and at bedside. Pt no longer appropriate for 2C, will be admitted to ICU instead. Pt and family made aware and verbalized understanding. Pt pale, placed on non rebreather at 10L along with pads to the crash cart per MD Posey Pronto. Fluids being bolused in at this time. Consent form signed at this time by wife for the administration of blood products. Blood bank called for FFP and unit of blood at this time.

## 2015-06-08 NOTE — Progress Notes (Addendum)
[name]: Tyler Stout, male   DOB: 1925/01/19, 82 y.o.   MRN: [id number] Called by radiologist with positive bleeding scan. Patient is bleeding from cecal and ascending colon area. He has had 8 bloody bowel movements since coming to the emergency room Currently on levophed at 6 mics per minute. He's had 2 units of blood transfusion. One unit of FFP. Vitamin K. Discussed with vascular surgery on call. Vasculature surgeon informed he does not do microembolization. Spoke with ICU attending at [facility] okay for patient to transfer if bed available and intervention radiology able to do microembolization tonight Discussed with family at length and they are agreeable for transfer to [facility]. There second choice is [facility]. We'll give 2 more units of blood transfusion once patient returns to the unit from nuclear medicine  Critical time 40 mins

## 2015-06-08 NOTE — Telephone Encounter (Signed)
Pt wife called stating that her husband is having blood in his stool.. They only wanted to see Dr. Nicki Reaper.. Please advise

## 2015-06-08 NOTE — Telephone Encounter (Signed)
Noted, spoke with wife.

## 2015-06-08 NOTE — Telephone Encounter (Signed)
Agree, if having blood in stool, needs evaluation today.  Please notify.

## 2015-06-08 NOTE — ED Notes (Signed)
Pt returned from CT at this time.  

## 2015-06-08 NOTE — ED Notes (Signed)
Lab called at this time to process the CBC

## 2015-06-08 NOTE — Telephone Encounter (Signed)
Spoke with patient's wife, I explained that Dr. Nicki Reaper is out of the office till Tuesday.  She verbally understood.  I explained that with him on warfarin that he needs to be evaluated.  They called another provider and are going to get his labs checked and then evaluate what needs he needs.  They will follow up with [place] as needed. Thanks

## 2015-06-08 NOTE — Progress Notes (Signed)
[name]: Tyler Stout, male   DOB: 1925-08-29, 82 y.o.   MRN: [id number] Spoke with Dr.Hassle interventional radiology at [facility] about patient's massive GI bleed and requiring MicroBid embolization procedure. Patient has been accepted at [facility]. Spoke with ICU attendingDr Wallis Bamberg and agreeable to accept patient. Discussed with family updated about transfer. Family understands risks and benefits for transfer. Patient still actively bleeding. We'll give 2 more. Units of blood transfusion simultaneously to keep the ahead of the rectal bleed.Marland Kitchen Appears to be tachycardic which is likely due to volume loss. We'll increase IV fluids at 150 cc an hour. Critical time 35 mins

## 2015-06-08 NOTE — Telephone Encounter (Signed)
Pt was advise to go to the ER per Lavella Lemons

## 2015-06-08 NOTE — ED Notes (Addendum)
MD Mungal at bedside at this time inserted central line. Verbal consent obtained from pt and family with MD Mungal at bedside.

## 2015-06-08 NOTE — ED Notes (Signed)
Pt transferred to Nuclear med at this time by ED RN Larene Beach. MD Mogual and MD Posey Pronto verbalized pt stable enough to tolerate scan as long as RN present to monitor vitals and titrate meds. Charge RN made aware as well.

## 2015-06-08 NOTE — ED Notes (Signed)
MD Mungal at bedside at this time.

## 2015-06-08 NOTE — ED Notes (Signed)
Report called to ICU [place] at this time. Pt will be transferred from Nuclear med to ICU, ICU RN made aware and verbalized understanding.

## 2015-06-08 NOTE — ED Notes (Signed)
Pt ambulatory to bathroom at this time with assist x 1. Pt tolerated well, NAD noted at this time.

## 2015-06-08 NOTE — Procedures (Signed)
  Procedure Note: Right Femoral Central Venous Catheter Placement  Micah Flesher , [id number] , ED01A/ED01A  Indications: Hemodynamic monitoring / Intravenous access  Benefits, risks (including bleeding, infection,  Injury, etc.), and alternatives explained to patient and wife and daughter who voiced understanding.  Questions were sought and answered.   All parties agreed to proceed with the procedure.  Consent is signed and on chart. A time-out was completed verifying correct patient, procedure and site.  A 3 lumen catheter available at the time of procedure.  The patient was placed in a dependent position appropriate for central line placement based on the vein to be cannulated.   The patient's RIGHT Femoral Vein was prepped and draped in a sterile fashion.  1% Lidocaine WAS used to anesthetize the surrounding skin area.   A3 lumen catheter was introduced into the RIGHT Femoral Vein using Seldinger technique, visualized under ultrasound.  The catheter was threaded smoothly over the guide wire and appropriate blood return was obtained.  Each lumen of the catheter was evacuated of air and flushed with sterile saline.  The catheter was then sutured in place to the skin and a sterile dressing applied.  Perfusion to the extremity distal to the point of catheter insertion was checked and found to be adequate.     The patient tolerated the procedure well and there were no complications.  Vilinda Boehringer, MD [facility] Pager [phone] (please enter 7-digits) On Call Pager - [phone] (please enter 7-digits)

## 2015-06-08 NOTE — ED Notes (Signed)
Pt reports he is having another bowel movement at this time.This BM is the 8th bloody bowel movement since arrival to ED.

## 2015-06-08 NOTE — Progress Notes (Addendum)
[name]: Tyler Stout, male   DOB: November 13, 1925, 82 y.o.   MRN: [id number] Patient's hemodynamic to 8.7. Hemoglobin was 12.4 on admission. Patient has is having emergent blood transfusion. First unit already has been started. 2 units of FFP will be transfused. His blood pressure is 100/70. Remains tachycardic with heart rate in the 190s. Denies any chest pain. No more rectal bleed.  Nuclear med tech on the way to get a GI bleeding scan.  Critical time 20 mins

## 2015-06-08 NOTE — H&P (Signed)
[facility] [place] NAME: Tyler Stout    MR#:  [id number]  DATE OF BIRTH:  09-23-1925  DATE OF ADMISSION:  06/08/2015  PRIMARY CARE PHYSICIAN: [name], MD   REQUESTING/REFERRING PHYSICIAN: Dr Archie Balboa  CHIEF COMPLAINT:  Bright red blood per rectum 5 this morning lower abdominal pain and nausea  HISTORY OF PRESENT ILLNESS:  Tyler Stout  is a 82 y.o. male with a known history ofHistory of chronic A. fib on Coumadin, BPH, glaucoma, hypertension comes to the emergency room after he had bloody bowel movement 5 this morning. Patient felt a little queasy and dizzy along with nausea. Denies any diarrhea. Has some lower abdominal pain. Denies any fever or vomiting. He was found to have diverticulitis and has multiple diverticuli on CT of the abdomen. Patient received IV Cipro and Flagyl in the emergency room. He is hemodynamically stable he is being admitted for further evaluation and management  PAST MEDICAL HISTORY:   Past Medical History  Diagnosis Date  . CAD (coronary artery disease)     s/p bypass graft x 4 (1991)  . GERD (gastroesophageal reflux disease)   . Atrial fibrillation ([place])   . Anemia     iron deficiency  . Hypertension   . Hypercholesterolemia   . Pancreatitis     x2 (1994 and 1998), presumed to be gallstone pancreatitis.  s/p ERCP and sphincterotomy  . Bilateral renal cysts     worked up by Dr Bernardo Heater  . Glaucoma   . BPH (benign prostatic hypertrophy)   . Diverticulosis   . Arthritis     PAST SURGICAL HISTOIRY:   Past Surgical History  Procedure Laterality Date  . Appendectomy    . Coronary artery bypass graft      x 4,  1991  . Cholecystectomy  1994  . Inguinal hernia repair  2009  . Open heart surgery  1991    SOCIAL HISTORY:   Social History  Substance Use Topics  . Smoking status: Former Smoker    Quit date: 01/14/1964  . Smokeless tobacco: Never Used  . Alcohol Use: No     FAMILY HISTORY:   Family History  Problem Relation Age of Onset  . Throat cancer Father     oropharyngeal cancer  . Kidney disease Sister   . Heart disease    . Colon cancer Neg Hx   . Prostate cancer Neg Hx     DRUG ALLERGIES:   Allergies  Allergen Reactions  . Ilosone [Erythromycin] Other (See Comments)    Reaction:  GI upset   . Penicillins Rash and Other (See Comments)    Has patient had a PCN reaction causing immediate rash, facial/tongue/throat swelling, SOB or lightheadedness with hypotension: No Has patient had a PCN reaction causing severe rash involving mucus membranes or skin necrosis: No Has patient had a PCN reaction that required hospitalization No Has patient had a PCN reaction occurring within the last 10 years: No If all of the above answers are "NO", then may proceed with Cephalosporin use.    REVIEW OF SYSTEMS:  Review of Systems  Constitutional: Negative for fever, chills and weight loss.  HENT: Negative for ear discharge, ear pain and nosebleeds.   Eyes: Negative for blurred vision, pain and discharge.  Respiratory: Negative for sputum production, shortness of breath, wheezing and stridor.   Cardiovascular: Negative for chest pain, palpitations, orthopnea and PND.  Gastrointestinal: Positive for nausea, abdominal pain and blood in  stool. Negative for vomiting and diarrhea.  Genitourinary: Negative for urgency and frequency.  Musculoskeletal: Negative for back pain and joint pain.  Neurological: Negative for sensory change, speech change, focal weakness and weakness.  Psychiatric/Behavioral: Negative for depression and hallucinations. The patient is not nervous/anxious.      MEDICATIONS AT HOME:   Prior to Admission medications   Medication Sig Start Date End Date Taking? Authorizing Provider  acetaminophen (TYLENOL) 500 MG tablet Take 1,000 mg by mouth 2 (two) times daily.    Historical Provider, MD  atorvastatin (LIPITOR) 20 MG tablet Take 20  mg by mouth at bedtime.    Historical Provider, MD  Calcium Carbonate-Vitamin D (CALCIUM 600+D) 600-400 MG-UNIT per tablet Take 1 tablet by mouth daily.    Historical Provider, MD  diltiazem (CARDIZEM CD) 240 MG 24 hr capsule Take 240 mg by mouth at bedtime.    Historical Provider, MD  furosemide (LASIX) 20 MG tablet Take 10 mg by mouth daily.     Historical Provider, MD  HYDROcodone-acetaminophen (NORCO/VICODIN) 5-325 MG tablet Take 1 tablet by mouth every 6 (six) hours as needed. for pain 04/23/15   Historical Provider, MD  latanoprost (XALATAN) 0.005 % ophthalmic solution Place 1 drop into both eyes at bedtime.     Historical Provider, MD  metoprolol tartrate (LOPRESSOR) 25 MG tablet Take 25 mg by mouth 2 (two) times daily.    Historical Provider, MD  Multiple Vitamin (MULTIVITAMIN WITH MINERALS) TABS tablet Take 1 tablet by mouth daily.    Historical Provider, MD  Omega-3 Fatty Acids (FISH OIL) 1000 MG CAPS Take 1,000 mg by mouth daily.    Historical Provider, MD  pantoprazole (PROTONIX) 40 MG tablet TAKE 1 TABLET BY MOUTH DAILY 04/23/15   [name], MD  tamsulosin (FLOMAX) 0.4 MG CAPS capsule Take 0.4 mg by mouth at bedtime.    Historical Provider, MD  tamsulosin (FLOMAX) 0.4 MG CAPS capsule TAKE 1 CAPSULE (0.4 MG TOTAL) BY MOUTH DAILY. 03/26/15   [name], MD  vitamin C (ASCORBIC ACID) 500 MG tablet Take 500 mg by mouth daily.    Historical Provider, MD  warfarin (COUMADIN) 2 MG tablet Take 2 mg by mouth every evening. Pt takes with one-half of a 5mg  tablet.    Historical Provider, MD  warfarin (COUMADIN) 5 MG tablet Take 2.5 mg by mouth every evening. Pt takes with a 2mg  tablet.    Historical Provider, MD      VITAL SIGNS:  Blood pressure 118/52, pulse 79, temperature 96.3 F (35.7 C), temperature source Axillary, resp. rate 18, height 5\' 6"  (1.676 m), weight 63.504 kg (140 lb), SpO2 100 %.  PHYSICAL EXAMINATION:  GENERAL:  82 y.o.-year-old patient lying in the bed with no acute  distress.  EYES: Pupils equal, round, reactive to light and accommodation. No scleral icterus. Extraocular muscles intact.  HEENT: Head atraumatic, normocephalic. Oropharynx and nasopharynx clear.  NECK:  Supple, no jugular venous distention. No thyroid enlargement, no tenderness.  LUNGS: Normal breath sounds bilaterally, no wheezing, rales,rhonchi or crepitation. No use of accessory muscles of respiration.  CARDIOVASCULAR: S1, S2 normal. No murmurs, rubs, or gallops.  ABDOMEN: Soft, lower abdomen tenderness +, nondistended. Bowel sounds present. No organomegaly or mass.  EXTREMITIES: No pedal edema, cyanosis, or clubbing.  NEUROLOGIC: Cranial nerves II through XII are intact. Muscle strength 5/5 in all extremities. Sensation intact. Gait not checked.  PSYCHIATRIC: The patient is alert and oriented x 3.  SKIN: No obvious rash, lesion, or ulcer.  LABORATORY PANEL:   CBC  Recent Labs Lab 06/08/15 0958  WBC 14.2*  HGB 12.2*  HCT 36.3*  PLT 215   ------------------------------------------------------------------------------------------------------------------  Chemistries   Recent Labs Lab 06/08/15 0958  NA 136  K 4.2  CL 103  CO2 25  GLUCOSE 185*  BUN 20  CREATININE 1.04  CALCIUM 8.7*  AST 27  ALT 21  ALKPHOS 69  BILITOT 0.8   ------------------------------------------------------------------------------------------------------------------  Cardiac Enzymes No results for input(s): TROPONINI in the last 168 hours. ------------------------------------------------------------------------------------------------------------------  RADIOLOGY:  Ct Abdomen Pelvis W Contrast  06/08/2015  CLINICAL DATA:  Bright rectal bleeding since 5 a.m. today. Previous cholecystectomy, appendectomy and CABG. EXAM: CT ABDOMEN AND PELVIS WITH CONTRAST TECHNIQUE: Multidetector CT imaging of the abdomen and pelvis was performed using the standard protocol following bolus administration of  intravenous contrast. CONTRAST:  138mL ISOVUE-300 IOPAMIDOL (ISOVUE-300) INJECTION 61% COMPARISON:  11/17/2006. FINDINGS: Lower chest:  Stable minimal linear scarring at both lung bases. Hepatobiliary: Mild diffuse low density of the liver relative to the spleen. Cholecystectomy clips. Pancreas: Moderate diffuse pancreatic atrophy with progression and associated pancreatic ductal dilatation. Spleen: Multiple calcified granulomas.  Normal in size. Adrenals/Urinary Tract: Small bilateral renal cysts. Normal appearing adrenal glands and ureters. Mild diffuse bladder wall thickening. The prostate gland is protruding into the bladder base. Stomach/Bowel: Multiple diverticula throughout the colon. Mild concentric wall thickening and lack of distention of the midportion of the ascending colon, measuring 5 cm in length. Mild adjacent pericolonic soft tissue stranding proximally. Surgically absent appendix. Small sliding hiatal hernia. No small bowel abnormalities. Vascular/Lymphatic: Atheromatous arterial calcifications. No aneurysm or enlarged lymph nodes. Reproductive: Moderately to markedly enlarged and heterogeneous prostate gland with protrusion into the base of the urinary bladder. Other: Small bilateral inguinal hernias containing fat. Musculoskeletal: Lumbar and lower thoracic spine degenerative changes. IMPRESSION: 1. Mild concentric wall thickening and lack of distention of the midportion of the ascending colon, measuring 5 cm in length, in an area of multiple diverticula and mild pericolonic soft tissue stranding. This is most likely due to mild diverticulitis. A colonic neoplasm is less likely but not excluded. 2. Extensive diverticulosis elsewhere throughout the colon. 3. Small sliding hiatal hernia. 4. Moderate diffuse pancreatic atrophy. 5. Mild diffuse hepatic steatosis. 6. Moderately to markedly enlarged and heterogeneous prostate gland. 7. Mild diffuse bladder wall thickening, compatible with chronic  bladder outlet obstruction due to the enlarged prostate gland. Electronically Signed   By: Claudie Revering M.D.   On: 06/08/2015 13:43   IMPRESSION AND PLAN:   Tyler Stout  is a 82 y.o. male with a known history ofHistory of chronic A. fib on Coumadin, BPH, glaucoma, hypertension comes to the emergency room after he had bloody bowel movement 5 this morning. Patient felt a little queasy and dizzy along with nausea.  1. Acute rectal bleed in the setting mild acute diverticulitis and diverticular bleed with patient being on Coumadin with INR 2.7 -Hold off Coumadin. If patient rebleeds will reverse INR  -Monitor H&H  -IV Cipro Flagyl  -IV fluids  Monitor white cell count  -Spoke with Dr. Vira Agar to see patient in consultation   2. Chronic A. fib on Coumadin  -Heart rate controlled, continue diltiazem   3. CAD continue cardiac meds   4. Hypertension continue Cardizem and metoprolol   5. Hyperlipidemia continue statin as   6. DVT prophylaxis SCD and teds. GI bleed hence no antiplatelets  All the records are reviewed and case discussed with ED provider. Management plans  discussed with the patient, family and they are in agreement.  CODE STATUS: full  TOTAL TIME TAKING CARE OF THIS PATIENT: 50inutes.    Tyler Stout M.D on 06/08/2015 at 2:43 PM  Between 7am to 6pm - Pager - [phone]  After 6pm go to [email or web address] - password [email or web address] [facility]  Office  [phone]  CC: Primary care physician; [name], MD

## 2015-06-08 NOTE — ED Notes (Signed)
CT notified that pt completed contrast.

## 2015-06-08 NOTE — ED Notes (Signed)
MD Tiffany Kocher at bedside

## 2015-06-08 NOTE — Consult Note (Signed)
PULMONARY / CRITICAL CARE MEDICINE   Name: Tyler Stout MRN: [id number] DOB: 22-Sep-1925    ADMISSION DATE:  06/08/2015   CONSULTATION DATE:  06/08/2015  REFERRING MD:  Hospitalist  CHIEF COMPLAINT: Acute GI bleed, hypovolemic shock  HISTORY OF PRESENT ILLNESS:   This is an 82 yo Caucasian male with a PMH of CAD s/p CABG X 4, GERD, Afib on coumadin, hypertension, hyperlipidemia, and diverticulosis who presents bloody stools x5. Patient state that he was in his usual state of health when this morning he started having multiple bloody stools. He also reports nausea, dizziness, lower abdominal pain and epigastric pain but denies vomiting, chest pain, and palpitations. He denies having similar symptoms in the past.  His CT abdomen showed diverticulitis, extensive diverticulosis and multiple diverticula and an enlarged prostate.  While in the ED, patient's hemoglobin and hematocrit dropped from 12.2/36.3 to 8.7/26.1 and he became hypotensive with systolic blood pressures in the low 70s despite fluid resuscitation. PCCM was consulted for a central line placement and further management.  He continues to have bright red blood per rectum. He is due to a bleeding scan this evening.   PAST MEDICAL HISTORY :  He  has a past medical history of CAD (coronary artery disease); GERD (gastroesophageal reflux disease); Atrial fibrillation ([place]); Anemia; Hypertension; Hypercholesterolemia; Pancreatitis; Bilateral renal cysts; Glaucoma; BPH (benign prostatic hypertrophy); Diverticulosis; and Arthritis.  PAST SURGICAL HISTORY: He  has past surgical history that includes Appendectomy; Coronary artery bypass graft; Cholecystectomy (1994); Inguinal hernia repair (2009); and open heart surgery (1991).  Allergies  Allergen Reactions  . Ilosone [Erythromycin] Other (See Comments)    Reaction:  GI upset   . Penicillins Rash and Other (See Comments)    Has patient had a PCN reaction causing immediate rash,  facial/tongue/throat swelling, SOB or lightheadedness with hypotension: No Has patient had a PCN reaction causing severe rash involving mucus membranes or skin necrosis: No Has patient had a PCN reaction that required hospitalization No Has patient had a PCN reaction occurring within the last 10 years: No If all of the above answers are "NO", then may proceed with Cephalosporin use.    No current facility-administered medications on file prior to encounter.   Current Outpatient Prescriptions on File Prior to Encounter  Medication Sig  . acetaminophen (TYLENOL) 500 MG tablet Take 1,000 mg by mouth 2 (two) times daily.  Marland Kitchen atorvastatin (LIPITOR) 20 MG tablet Take 20 mg by mouth at bedtime.  . Calcium Carbonate-Vitamin D (CALCIUM 600+D) 600-400 MG-UNIT per tablet Take 1 tablet by mouth daily.  Marland Kitchen diltiazem (CARDIZEM CD) 240 MG 24 hr capsule Take 240 mg by mouth at bedtime.  . furosemide (LASIX) 20 MG tablet Take 10 mg by mouth daily.   Marland Kitchen HYDROcodone-acetaminophen (NORCO/VICODIN) 5-325 MG tablet Take 1 tablet by mouth every 6 (six) hours as needed. for pain  . latanoprost (XALATAN) 0.005 % ophthalmic solution Place 1 drop into both eyes at bedtime.   . metoprolol tartrate (LOPRESSOR) 25 MG tablet Take 25 mg by mouth 2 (two) times daily.  . Multiple Vitamin (MULTIVITAMIN WITH MINERALS) TABS tablet Take 1 tablet by mouth daily.  . Omega-3 Fatty Acids (FISH OIL) 1000 MG CAPS Take 1,000 mg by mouth daily.  . pantoprazole (PROTONIX) 40 MG tablet TAKE 1 TABLET BY MOUTH DAILY  . tamsulosin (FLOMAX) 0.4 MG CAPS capsule Take 0.4 mg by mouth at bedtime.  . tamsulosin (FLOMAX) 0.4 MG CAPS capsule TAKE 1 CAPSULE (0.4 MG TOTAL) BY MOUTH  DAILY.  . vitamin C (ASCORBIC ACID) 500 MG tablet Take 500 mg by mouth daily.  Marland Kitchen warfarin (COUMADIN) 2 MG tablet Take 2 mg by mouth every evening. Pt takes with one-half of a 5mg  tablet.  . warfarin (COUMADIN) 5 MG tablet Take 2.5 mg by mouth every evening. Pt takes with a 2mg   tablet.    FAMILY HISTORY:  His has no family status information on file.   SOCIAL HISTORY: He  reports that he quit smoking about 51 years ago. He has never used smokeless tobacco. He reports that he does not drink alcohol or use illicit drugs.  REVIEW OF SYSTEMS:   Constitutional: Negative for fever and chills.  HENT: Negative for congestion and rhinorrhea.  Eyes: Negative for redness and visual disturbance.  Respiratory: Negative for shortness of breath and wheezing.  Cardiovascular: Negative for chest pain and palpitations.  Gastrointestinal: Positive for nausea, abdominal pain, and bloody stools Genitourinary: Negative for dysuria and urgency.  Musculoskeletal: Negative for myalgias and arthralgias.  Skin: Negative for pallor and wound.  Neurological: Negative for headaches but positive for dizziness  SUBJECTIVE:   VITAL SIGNS: BP 85/52 mmHg  Pulse 118  Temp(Src) 98.7 F (37.1 C) (Oral)  Resp 22  Ht 5\' 6"  (1.676 m)  Wt 140 lb (63.504 kg)  BMI 22.61 kg/m2  SpO2 100%  HEMODYNAMICS:    VENTILATOR SETTINGS:    INTAKE / OUTPUT:    PHYSICAL EXAMINATION: General: Acutely ill-looking, NAD Neuro: AAOX4, speech is normal, no focal deficits HEENT: [place]/AT, PERRLA, oral mucosa moist and pink, trachea midline Cardiovascular: tachycardic, s1/s2, no mRG Lungs: CTAB Abdomen: Non-distended, +bowel sounds, mild epigastric and bilateral lower quadrant tenderness on palpation.  Musculoskeletal: +rom, no visible deformities Extremities: + 2 pulses, no edema Skin:  Warm and dry  LABS:  BMET  Recent Labs Lab 06/08/15 0958  NA 136  K 4.2  CL 103  CO2 25  BUN 20  CREATININE 1.04  GLUCOSE 185*    Electrolytes  Recent Labs Lab 06/08/15 0958  CALCIUM 8.7*    CBC  Recent Labs Lab 06/08/15 0958 06/08/15 1615  WBC 14.2* 10.0  HGB 12.2* 8.7*  HCT 36.3* 26.1*  PLT 215 187    Coag's  Recent Labs Lab 06/08/15 0958  INR 2.70    Sepsis Markers No  results for input(s): LATICACIDVEN, PROCALCITON, O2SATVEN in the last 168 hours.  ABG No results for input(s): PHART, PCO2ART, PO2ART in the last 168 hours.  Liver Enzymes  Recent Labs Lab 06/08/15 0958  AST 27  ALT 21  ALKPHOS 69  BILITOT 0.8  ALBUMIN 3.7    Cardiac Enzymes No results for input(s): TROPONINI, PROBNP in the last 168 hours.  Glucose No results for input(s): GLUCAP in the last 168 hours.  Imaging Ct Abdomen Pelvis W Contrast  06/08/2015  CLINICAL DATA:  Bright rectal bleeding since 5 a.m. today. Previous cholecystectomy, appendectomy and CABG. EXAM: CT ABDOMEN AND PELVIS WITH CONTRAST TECHNIQUE: Multidetector CT imaging of the abdomen and pelvis was performed using the standard protocol following bolus administration of intravenous contrast. CONTRAST:  172mL ISOVUE-300 IOPAMIDOL (ISOVUE-300) INJECTION 61% COMPARISON:  11/17/2006. FINDINGS: Lower chest:  Stable minimal linear scarring at both lung bases. Hepatobiliary: Mild diffuse low density of the liver relative to the spleen. Cholecystectomy clips. Pancreas: Moderate diffuse pancreatic atrophy with progression and associated pancreatic ductal dilatation. Spleen: Multiple calcified granulomas.  Normal in size. Adrenals/Urinary Tract: Small bilateral renal cysts. Normal appearing adrenal glands and ureters. Mild diffuse  bladder wall thickening. The prostate gland is protruding into the bladder base. Stomach/Bowel: Multiple diverticula throughout the colon. Mild concentric wall thickening and lack of distention of the midportion of the ascending colon, measuring 5 cm in length. Mild adjacent pericolonic soft tissue stranding proximally. Surgically absent appendix. Small sliding hiatal hernia. No small bowel abnormalities. Vascular/Lymphatic: Atheromatous arterial calcifications. No aneurysm or enlarged lymph nodes. Reproductive: Moderately to markedly enlarged and heterogeneous prostate gland with protrusion into the base of  the urinary bladder. Other: Small bilateral inguinal hernias containing fat. Musculoskeletal: Lumbar and lower thoracic spine degenerative changes. IMPRESSION: 1. Mild concentric wall thickening and lack of distention of the midportion of the ascending colon, measuring 5 cm in length, in an area of multiple diverticula and mild pericolonic soft tissue stranding. This is most likely due to mild diverticulitis. A colonic neoplasm is less likely but not excluded. 2. Extensive diverticulosis elsewhere throughout the colon. 3. Small sliding hiatal hernia. 4. Moderate diffuse pancreatic atrophy. 5. Mild diffuse hepatic steatosis. 6. Moderately to markedly enlarged and heterogeneous prostate gland. 7. Mild diffuse bladder wall thickening, compatible with chronic bladder outlet obstruction due to the enlarged prostate gland. Electronically Signed   By: Claudie Revering M.D.   On: 06/08/2015 13:43    STUDIES:  05/26: Bleeding scan  CULTURES: None  ANTIBIOTICS: Ciprofloxacin 05/26> Flagyl 05/26>  SIGNIFICANT EVENTS: 05/26>ED with multiple bloody stools>Admitted with acute GIB and diverticulitis  LINES/TUBES: RT Femoral 05/26> PIVs  DISCUSSION: 82 yo male with a h/o afib on coumadin presenting with acute lower GI, hypovolemic shock and diverticulitis. Exact etiology unclear but presentation and imaging studies favor a diverticular bleed; however neoplastic, vascular and infectious causes cannot be completely eliminated.   ASSESSMENT / PLAN:  PULMONARY A: No acute issues P:   -Supplemental O2 prn  CARDIOVASCULAR A:  Hypovolemic shock secondary to rectal bleeding Tachycardia H/o Afib H/o Hypertension P:  -Fluid resuscitation -Levophed and titrate to keep MAP>65 -Hemodynamics per ICU  RENAL A:   No acute issues P:   -Monitor intake and output -Monitor and replace electrolytes  GASTROINTESTINAL A:   Acute lower GI bleed-exact etiology unclear; diverticular versus infectious versus  neoplastic H/O GERD P:   -GI and IR following -Radionuclide scan tonight; will await results -PPI  HEMATOLOGIC A:   Acute blood loss anemia Chronic anticoagulant therapy P:  -D/C all anticoagulants -Transfuse as ordered -Trend hemoglobin and hematocrit -Vitamin K  INFECTIOUS A:   Acute diverticulitis P:   -Cipro and flagyl  ENDOCRINE A:   No acute issues P:   -Monitor blood glucose with BMP  NEUROLOGIC A:   No acute issues P:   RASS goal:  -monitor mental status   Disposition and family update:   Best Practice: Code Status:  Full. Diet:NPO GI prophylaxis:  PPI. VTE prophylaxis:  SCD's /No pharmacologic VTE prophylaxis due to GI Bleed  Aryana Wonnacott S. [facility] ANP-BC Pulmonary and Critical Care Medicine [facility] Pager [phone] or [phone] 06/08/2015, 8:38 PM

## 2015-06-08 NOTE — ED Notes (Addendum)
RN assisted pt to get up and use restroom, pt became lightheaded and dizzy, syncopal episode occurred at this time while pt was sitting up in the bed. Episode lasted approx 10-15 seconds. MD Posey Pronto made aware at this time, verbal orders given to NS bolus. RN repeated EKG due to tachycardia, given to MD Posey Pronto at this time. MD Posey Pronto at bedside at this time re assessing pt.

## 2015-06-08 NOTE — Discharge Summary (Signed)
[facility] [place] NAME: Tyler Stout    MR#:  [id number]  DATE OF BIRTH:  08-Mar-1925  DATE OF ADMISSION:  06/08/2015 ADMITTING PHYSICIAN: Fritzi Mandes, MD  DATE OF DISCHARGE: 06/08/2015  PRIMARY CARE PHYSICIAN: [name], MD    ADMISSION DIAGNOSIS:  Rectal bleed [K62.5] Diverticulitis of intestine without perforation or abscess with bleeding [K57.93] Gastrointestinal hemorrhage, unspecified gastritis, unspecified gastrointestinal hemorrhage type [K92.2]  DISCHARGE DIAGNOSIS:  AcAcute hemorrhagic/rectal bleed appears diverticular with positive GI bleeding scan Hypovolemic shock Tachycardia Acute diverticulitis History of chronic A. fib was on Coumadin now reversed  SECONDARY DIAGNOSIS:   Past Medical History  Diagnosis Date  . CAD (coronary artery disease)     s/p bypass graft x 4 (1991)  . GERD (gastroesophageal reflux disease)   . Atrial fibrillation ([place])   . Anemia     iron deficiency  . Hypertension   . Hypercholesterolemia   . Pancreatitis     x2 (1994 and 1998), presumed to be gallstone pancreatitis.  s/p ERCP and sphincterotomy  . Bilateral renal cysts     worked up by Dr Bernardo Heater  . Glaucoma   . BPH (benign prostatic hypertrophy)   . Diverticulosis   . Arthritis     HOSPITAL COURSE:   Tyler Stout is a 82 y.o. male with a known history ofHistory of chronic A. fib on Coumadin, BPH, glaucoma, hypertension comes to the emergency room after he had bloody bowel movement 5 this morning. Patient felt a little queasy and dizzy along with nausea.  1. Acute hemorrhagic massive rectal bleed/hypovolemic shock in the setting mild acute diverticulitis and diverticular bleed with patient being on Coumadin with INR 2.7 -Hold off Coumadin.  -Patient is having ongoing large rectal bleed. He's had 8 bowel movements with bright red blood per rectum. He's had a positive GI bleeding scan which shows active bleeding  and cecum/ascending colon. -Spoke with interventional radiology at [facility] [place] and agreeable for MicroBid embolization procedure. Patient will be transferred to ICU at [facility]. Family agreeable understandable it's an emergency and this can benefits for transient transfer have been discussed with patient and family -IV Cipro Flagyl  -IV fluids  -Spoke with Dr. Vira Agar GI. Patient recommends vascular consultation.    2. Hypovolemic/hemorrhagic shock -Patient so far has had 1 unit of FFP 3 units of blood transfusion his fourth unit is going on. -He came in with hemoglobin of 12.7 dropped down to 8.7 -Repeat labs at present are pending in the form of hemoglobin and PT/INR. -IV levophed  3. Acute on Chronic A. fib in the setting of hypovolemic shock -Continue to replete volume loss. Unable to give rate blocking agent given 1. and low blood pressure.  4. CAD  Stable. No cp  5. Hyperlipidemia on statin    6. DVT prophylaxis SCD and teds. GI bleed hence no antiplatelets   Patient appears critically ill. Transfer is in his best interest at present. Patient and family understands risks and benefits.  Patient has been accepted at [facility].  CONSULTS OBTAINED:     DRUG ALLERGIES:   Allergies  Allergen Reactions  . Ilosone [Erythromycin] Other (See Comments)    Reaction:  GI upset   . Penicillins Rash and Other (See Comments)    Has patient had a PCN reaction causing immediate rash, facial/tongue/throat swelling, SOB or lightheadedness with hypotension: No Has patient had a PCN reaction causing severe rash involving mucus membranes or skin necrosis:  No Has patient had a PCN reaction that required hospitalization No Has patient had a PCN reaction occurring within the last 10 years: No If all of the above answers are "NO", then may proceed with Cephalosporin use.    DISCHARGE MEDICATIONS:   Current Discharge Medication List    START taking these medications   Details   ciprofloxacin (CIPRO) 400 MG/200ML SOLN Inject 200 mLs (400 mg total) into the vein 2 (two) times daily. Qty: 2000 mL, Refills: 0    metroNIDAZOLE (FLAGYL) 5-0.79 MG/ML-% IVPB Inject 100 mLs (500 mg total) into the vein every 8 (eight) hours. Qty: 100 mL, Refills: 0    morphine 2 MG/ML injection Inject 0.5 mLs (1 mg total) into the vein every 4 (four) hours as needed. Qty: 1 mL, Refills: 0    norepinephrine 4 mg in dextrose 5 % 250 mL Inject 0-40 mcg/min into the vein continuous. Qty: 1 ampule, Refills: 0      CONTINUE these medications which have NOT CHANGED   Details  atorvastatin (LIPITOR) 20 MG tablet Take 20 mg by mouth at bedtime.    Calcium Carbonate-Vitamin D (CALCIUM 600+D) 600-400 MG-UNIT per tablet Take 1 tablet by mouth daily.    carbidopa-levodopa (SINEMET IR) 25-100 MG tablet Take 1 tablet by mouth 3 (three) times daily.    HYDROcodone-acetaminophen (NORCO/VICODIN) 5-325 MG tablet Take 1 tablet by mouth every 6 (six) hours as needed for moderate pain.  Refills: 0    latanoprost (XALATAN) 0.005 % ophthalmic solution Place 1 drop into both eyes at bedtime.     tamsulosin (FLOMAX) 0.4 MG CAPS capsule Take 0.4 mg by mouth daily after supper.       STOP taking these medications     acetaminophen (TYLENOL) 500 MG tablet      diltiazem (CARDIZEM CD) 240 MG 24 hr capsule      furosemide (LASIX) 40 MG tablet      metoprolol tartrate (LOPRESSOR) 25 MG tablet      Multiple Vitamin (MULTIVITAMIN WITH MINERALS) TABS tablet      omega-3 acid ethyl esters (LOVAZA) 1 g capsule      pantoprazole (PROTONIX) 40 MG tablet      vitamin C (ASCORBIC ACID) 500 MG tablet      warfarin (COUMADIN) 2 MG tablet      warfarin (COUMADIN) 5 MG tablet      Omega-3 Fatty Acids (FISH OIL) 1000 MG CAPS      pantoprazole (PROTONIX) 40 MG tablet         If you experience worsening of your admission symptoms, develop shortness of breath, life threatening emergency, suicidal or  homicidal thoughts you must seek medical attention immediately by calling 911 or calling your MD immediately  if symptoms less severe.  You Must read complete instructions/literature along with all the possible adverse reactions/side effects for all the Medicines you take and that have been prescribed to you. Take any new Medicines after you have completely understood and accept all the possible adverse reactions/side effects.   Please note  You were cared for by a hospitalist during your hospital stay. If you have any questions about your discharge medications or the care you received while you were in the hospital after you are discharged, you can call the unit and asked to speak with the hospitalist on call if the hospitalist that took care of you is not available. Once you are discharged, your primary care physician will handle any further medical issues. Please note  that NO REFILLS for any discharge medications will be authorized once you are discharged, as it is imperative that you return to your primary care physician (or establish a relationship with a primary care physician if you do not have one) for your aftercare needs so that they can reassess your need for medications and monitor your lab values.   CBC   Recent Labs Lab 06/08/15 1615 06/08/15 2051  WBC 10.0  --   HGB 8.7* 7.7*  HCT 26.1*  --   PLT 187  --     Chemistries   Recent Labs Lab 06/08/15 0958  NA 136  K 4.2  CL 103  CO2 25  GLUCOSE 185*  BUN 20  CREATININE 1.04  CALCIUM 8.7*  AST 27  ALT 21  ALKPHOS 69  BILITOT 0.8    Microbiology Results   No results found for this or any previous visit (from the past 240 hour(s)).  RADIOLOGY:  Ct Abdomen Pelvis W Contrast  06/08/2015  CLINICAL DATA:  Bright rectal bleeding since 5 a.m. today. Previous cholecystectomy, appendectomy and CABG. EXAM: CT ABDOMEN AND PELVIS WITH CONTRAST TECHNIQUE: Multidetector CT imaging of the abdomen and pelvis was performed using  the standard protocol following bolus administration of intravenous contrast. CONTRAST:  164mL ISOVUE-300 IOPAMIDOL (ISOVUE-300) INJECTION 61% COMPARISON:  11/17/2006. FINDINGS: Lower chest:  Stable minimal linear scarring at both lung bases. Hepatobiliary: Mild diffuse low density of the liver relative to the spleen. Cholecystectomy clips. Pancreas: Moderate diffuse pancreatic atrophy with progression and associated pancreatic ductal dilatation. Spleen: Multiple calcified granulomas.  Normal in size. Adrenals/Urinary Tract: Small bilateral renal cysts. Normal appearing adrenal glands and ureters. Mild diffuse bladder wall thickening. The prostate gland is protruding into the bladder base. Stomach/Bowel: Multiple diverticula throughout the colon. Mild concentric wall thickening and lack of distention of the midportion of the ascending colon, measuring 5 cm in length. Mild adjacent pericolonic soft tissue stranding proximally. Surgically absent appendix. Small sliding hiatal hernia. No small bowel abnormalities. Vascular/Lymphatic: Atheromatous arterial calcifications. No aneurysm or enlarged lymph nodes. Reproductive: Moderately to markedly enlarged and heterogeneous prostate gland with protrusion into the base of the urinary bladder. Other: Small bilateral inguinal hernias containing fat. Musculoskeletal: Lumbar and lower thoracic spine degenerative changes. IMPRESSION: 1. Mild concentric wall thickening and lack of distention of the midportion of the ascending colon, measuring 5 cm in length, in an area of multiple diverticula and mild pericolonic soft tissue stranding. This is most likely due to mild diverticulitis. A colonic neoplasm is less likely but not excluded. 2. Extensive diverticulosis elsewhere throughout the colon. 3. Small sliding hiatal hernia. 4. Moderate diffuse pancreatic atrophy. 5. Mild diffuse hepatic steatosis. 6. Moderately to markedly enlarged and heterogeneous prostate gland. 7. Mild  diffuse bladder wall thickening, compatible with chronic bladder outlet obstruction due to the enlarged prostate gland. Electronically Signed   By: Claudie Revering M.D.   On: 06/08/2015 13:43     Management plans discussed with the patient, family and they are in agreement.  CODE STATUS:     Code Status Orders        Start     Ordered   06/08/15 2147  Full code   Continuous     06/08/15 2147    Code Status History    Date Active Date Inactive Code Status Order ID Comments User Context   03/06/2015  9:17 PM 03/07/2015  5:14 PM Full Code [id number]  Bettey Costa, MD Inpatient      TOTAL  TIME TAKING CARE OF THIS PATIENT: 40 minutes.    Hadley Soileau M.D on 06/08/2015 at 10:20 PM  Between 7am to 6pm - Pager - [phone] After 6pm go to [email or web address] - password [email or web address] [place]  Hospitalists  Office  [phone]  CC: Primary care physician; [name], MD

## 2015-06-08 NOTE — ED Provider Notes (Signed)
[facility] Emergency Department Provider Note    ____________________________________________  Time seen: ~1020  I have reviewed the triage vital signs and the nursing notes.   HISTORY  Chief Complaint Rectal Bleeding   History limited by: Not Limited   HPI Tyler Stout is a 82 y.o. male who presents to the emergency department today because of concerns for rectal bleeding. The first episode started this morning. He has had 4-5 episodes of rectal bleeding. He has had diarrhea. He states the blood is mixed with the diarrhea. They did notice some bright red blood on the toilet.The patient has had some abdominal discomfort but no pain. Denies any vomiting although he has had some nausea. Denies most symptoms in the past. Is on warfarin for A. fib.   Past Medical History  Diagnosis Date  . CAD (coronary artery disease)     s/p bypass graft x 4 (1991)  . GERD (gastroesophageal reflux disease)   . Atrial fibrillation ([place])   . Anemia     iron deficiency  . Hypertension   . Hypercholesterolemia   . Pancreatitis     x2 (1994 and 1998), presumed to be gallstone pancreatitis.  s/p ERCP and sphincterotomy  . Bilateral renal cysts     worked up by Dr Bernardo Heater  . Glaucoma   . BPH (benign prostatic hypertrophy)   . Diverticulosis   . Arthritis     Patient Active Problem List   Diagnosis Date Noted  . Sepsis ([place]) 03/06/2015  . Health care maintenance 08/11/2014  . Light headedness 03/19/2014  . Sinus congestion 08/13/2013  . Nausea 05/09/2013  . Back pain 03/13/2013  . Hypokalemia 02/20/2013  . Diarrhea 02/20/2013  . CAD (coronary artery disease) 12/07/2011  . Hyponatremia 12/07/2011  . Atrial fibrillation ([place]) 12/07/2011  . Hypertension 11/20/2011  . Hypercholesteremia 11/20/2011  . Anemia 11/20/2011    Past Surgical History  Procedure Laterality Date  . Appendectomy    . Coronary artery bypass graft      x 4,  1991  . Cholecystectomy   1994  . Inguinal hernia repair  2009  . Open heart surgery  1991    Current Outpatient Rx  Name  Route  Sig  Dispense  Refill  . acetaminophen (TYLENOL) 500 MG tablet   Oral   Take 1,000 mg by mouth 2 (two) times daily.         Marland Kitchen atorvastatin (LIPITOR) 20 MG tablet   Oral   Take 20 mg by mouth at bedtime.         . Calcium Carbonate-Vitamin D (CALCIUM 600+D) 600-400 MG-UNIT per tablet   Oral   Take 1 tablet by mouth daily.         Marland Kitchen diltiazem (CARDIZEM CD) 240 MG 24 hr capsule   Oral   Take 240 mg by mouth at bedtime.         . furosemide (LASIX) 20 MG tablet   Oral   Take 10 mg by mouth daily.          Marland Kitchen HYDROcodone-acetaminophen (NORCO/VICODIN) 5-325 MG tablet   Oral   Take 1 tablet by mouth every 6 (six) hours as needed. for pain      0   . latanoprost (XALATAN) 0.005 % ophthalmic solution   Both Eyes   Place 1 drop into both eyes at bedtime.          . metoprolol tartrate (LOPRESSOR) 25 MG tablet   Oral  Take 25 mg by mouth 2 (two) times daily.         . Multiple Vitamin (MULTIVITAMIN WITH MINERALS) TABS tablet   Oral   Take 1 tablet by mouth daily.         . Omega-3 Fatty Acids (FISH OIL) 1000 MG CAPS   Oral   Take 1,000 mg by mouth daily.         . pantoprazole (PROTONIX) 40 MG tablet      TAKE 1 TABLET BY MOUTH DAILY   90 tablet   3   . tamsulosin (FLOMAX) 0.4 MG CAPS capsule   Oral   Take 0.4 mg by mouth at bedtime.         . tamsulosin (FLOMAX) 0.4 MG CAPS capsule      TAKE 1 CAPSULE (0.4 MG TOTAL) BY MOUTH DAILY.   30 capsule   5   . vitamin C (ASCORBIC ACID) 500 MG tablet   Oral   Take 500 mg by mouth daily.         Marland Kitchen warfarin (COUMADIN) 2 MG tablet   Oral   Take 2 mg by mouth every evening. Pt takes with one-half of a 5mg  tablet.         . warfarin (COUMADIN) 5 MG tablet   Oral   Take 2.5 mg by mouth every evening. Pt takes with a 2mg  tablet.           Allergies Ilosone and Penicillins  Family  History  Problem Relation Age of Onset  . Throat cancer Father     oropharyngeal cancer  . Kidney disease Sister   . Heart disease    . Colon cancer Neg Hx   . Prostate cancer Neg Hx     Social History Social History  Substance Use Topics  . Smoking status: Former Smoker    Quit date: 01/14/1964  . Smokeless tobacco: Never Used  . Alcohol Use: No    Review of Systems  Constitutional: Negative for fever. Cardiovascular: Negative for chest pain. Respiratory: Negative for shortness of breath. Gastrointestinal: Positive for rectal bleeding. Genitourinary: Negative for dysuria. Musculoskeletal: Negative for back pain. Skin: Negative for rash. Neurological: Negative for headaches, focal weakness or numbness.  10-point ROS otherwise negative.  ____________________________________________   PHYSICAL EXAM:  VITAL SIGNS: ED Triage Vitals  Enc Vitals Group     BP 06/08/15 0951 134/59 mmHg     Pulse Rate 06/08/15 0951 71     Resp --      Temp 06/08/15 0951 96.3 F (35.7 C)     Temp Source 06/08/15 0951 Axillary     SpO2 06/08/15 0951 100 %     Weight 06/08/15 0951 140 lb (63.504 kg)     Height 06/08/15 0951 5\' 6"  (1.676 m)     Head Cir --      Peak Flow --      Pain Score 06/08/15 0942 0   Constitutional: Alert and oriented. Well appearing and in no distress. Eyes: Conjunctivae are normal. PERRL. Normal extraocular movements. ENT   Head: Normocephalic and atraumatic.   Nose: No congestion/rhinnorhea.   Mouth/Throat: Mucous membranes are moist.   Neck: No stridor. Hematological/Lymphatic/Immunilogical: No cervical lymphadenopathy. Cardiovascular: Normal rate, regular rhythm.  No murmurs, rubs, or gallops. Respiratory: Normal respiratory effort without tachypnea nor retractions. Breath sounds are clear and equal bilaterally. No wheezes/rales/rhonchi. Gastrointestinal: Soft and nontender. No distention. There is no CVA tenderness. Genitourinary:  Deferred Musculoskeletal: Normal range of motion  in all extremities. No joint effusions.  No lower extremity tenderness nor edema. Neurologic:  Normal speech and language. No gross focal neurologic deficits are appreciated.  Skin:  Skin is warm, dry and intact. No rash noted. Psychiatric: Mood and affect are normal. Speech and behavior are normal. Patient exhibits appropriate insight and judgment.  ____________________________________________    LABS (pertinent positives/negatives)  Labs Reviewed  COMPREHENSIVE METABOLIC PANEL - Abnormal; Notable for the following:    Glucose, Bld 185 (*)    Calcium 8.7 (*)    All other components within normal limits  CBC - Abnormal; Notable for the following:    WBC 14.2 (*)    RBC 4.03 (*)    Hemoglobin 12.2 (*)    HCT 36.3 (*)    RDW 15.2 (*)    All other components within normal limits  PROTIME-INR - Abnormal; Notable for the following:    Prothrombin Time 28.3 (*)    All other components within normal limits  TYPE AND SCREEN  ABO/RH     ____________________________________________   EKG   Apolonio Schneiders, attending physician, personally viewed and interpreted this EKG  EKG Time: 0952 Rate: 69 Rhythm: normal sinus rhythm Axis: left axis deviation Intervals: qtc 487 QRS: RBBB, LAFB ST changes: no st elevation Impression: abnormal ekg  ____________________________________________    RADIOLOGY  CT abd/pel IMPRESSION: 1. Mild concentric wall thickening and lack of distention of the midportion of the ascending colon, measuring 5 cm in length, in an area of multiple diverticula and mild pericolonic soft tissue stranding. This is most likely due to mild diverticulitis. A colonic neoplasm is less likely but not excluded. 2. Extensive diverticulosis elsewhere throughout the colon. 3. Small sliding hiatal hernia. 4. Moderate diffuse pancreatic atrophy. 5. Mild diffuse hepatic steatosis. 6. Moderately to markedly enlarged  and heterogeneous prostate gland. 7. Mild diffuse bladder wall thickening, compatible with chronic bladder outlet obstruction due to the enlarged prostate gland.   ____________________________________________   PROCEDURES  Procedure(s) performed: None  Critical Care performed: No  ____________________________________________   INITIAL IMPRESSION / ASSESSMENT AND PLAN / ED COURSE  Pertinent labs & imaging results that were available during my care of the patient were reviewed by me and considered in my medical decision making (see chart for details).  Patient presents to the emergency department with concerns for GI bleeding. Patient is on warfarin. Will check PT/INR blood levels. Would anticipate admission.  CT scan concerning for diverticulitis. Will place patient on antibiotics and plan on admission also service.  ____________________________________________   FINAL CLINICAL IMPRESSION(S) / ED DIAGNOSES  Final diagnoses:  Diverticulitis of intestine without perforation or abscess with bleeding  Gastrointestinal hemorrhage, unspecified gastritis, unspecified gastrointestinal hemorrhage type     Note: This dictation was prepared with Dragon dictation. Any transcriptional errors that result from this process are unintentional    Nance Pear, MD 06/08/15 1601

## 2015-06-08 NOTE — ED Notes (Signed)
Will administer Cipro once flagyl completes.

## 2015-06-08 NOTE — ED Notes (Signed)
Patient states he woke up this morning and felt as if he was having a BM. Pt reports 5 episodes of rectal bleeding. Pt c/o feeling weak. Pt is on coumadin also.

## 2015-06-08 NOTE — ED Notes (Signed)
Sent from [facility] , c/o bright red rectal bleeding x 5 this am.  A&o, skin w/d

## 2015-06-08 NOTE — Consult Note (Signed)
Patient with LGI bleed on coumadin with INR 2.7 had a large bowel movement with mostly blood and had syncopal spell.  He complained of some chest pain and an EKG showed tachycardia compared to one eariler.  I spoke with Dr. Posey Pronto that he most likely needed to be reversed, to change from floor admission to CCU and to have a stat GI bleeding scan for possible embolization.

## 2015-06-08 NOTE — Consult Note (Signed)
GI Inpatient Consult Note  Reason for Consult: Lower GI bleed   Attending Requesting Consult: Dr. Posey Pronto  History of Present Illness: Tyler Stout is a 82 y.o. male with a history of Afib on Coumadin, CAD (Tyler/p CABG in 1991), HTN, HLD, GERD, diverticulosis, and IDA admitted with a lower GI bleed.  Patient presented to the [facility] ED after experiencing 4-5 episodes of rectal bleeding with loose stool this morning.  He reports the BRB with intermixed with stool and in the commode water.  Associated symptoms included nausea, dizziness, and lower abdominal discomfort.  No CP or SOB.  Also no epigastric pain, hematemesis, or melena.  Patient denies taking any NSAIDs at home.  His weight is stable, appetite is good.  No previous GI bleed per patient.  Since coming to the ED, patient reports two episodes of BRB with loose stool.  The first episode still contained a large volume of BRB, the later smaller volume per patient.  Notable labs: WBCs 14.2, Hgb 12.2 (baseline 12.8 one month ago), PT/INR 2.7 CT a/p: mild concentric wall thickening and lack of distention of mid-ascending colon (measuring 5cm), in an area of multiple diverticula and mild pericolonic soft tissue stranding Patient was given IV cipro and flagyl in the ED.  Last colonoscopy in 05/11/2013 Tyler Stout, indic: heme + stool) demonstrated diverticulosis throughout colon, as well as internal hemorrhoids.     Patient states his last dose of coumadin was last night.  Past Medical History:  Past Medical History  Diagnosis Date  . CAD (coronary artery disease)     Tyler/p bypass graft x 4 (1991)  . GERD (gastroesophageal reflux disease)   . Atrial fibrillation ([place])   . Anemia     iron deficiency  . Hypertension   . Hypercholesterolemia   . Pancreatitis     x2 (1994 and 1998), presumed to be gallstone pancreatitis.  Tyler/p ERCP and sphincterotomy  . Bilateral renal cysts     worked up by Dr Bernardo Heater  . Glaucoma   . BPH (benign prostatic  hypertrophy)   . Diverticulosis   . Arthritis     Problem List: Patient Active Problem List   Diagnosis Date Noted  . Rectal bleed 06/08/2015  . Sepsis ([place]) 03/06/2015  . Health care maintenance 08/11/2014  . Light headedness 03/19/2014  . Sinus congestion 08/13/2013  . Nausea 05/09/2013  . Back pain 03/13/2013  . Hypokalemia 02/20/2013  . Diarrhea 02/20/2013  . CAD (coronary artery disease) 12/07/2011  . Hyponatremia 12/07/2011  . Atrial fibrillation ([place]) 12/07/2011  . Hypertension 11/20/2011  . Hypercholesteremia 11/20/2011  . Anemia 11/20/2011    Past Surgical History: Past Surgical History  Procedure Laterality Date  . Appendectomy    . Coronary artery bypass graft      x 4,  1991  . Cholecystectomy  1994  . Inguinal hernia repair  2009  . Open heart surgery  1991    Allergies: Allergies  Allergen Reactions  . Ilosone [Erythromycin] Other (See Comments)    Reaction:  GI upset   . Penicillins Rash and Other (See Comments)    Has patient had a PCN reaction causing immediate rash, facial/tongue/throat swelling, SOB or lightheadedness with hypotension: No Has patient had a PCN reaction causing severe rash involving mucus membranes or skin necrosis: No Has patient had a PCN reaction that required hospitalization No Has patient had a PCN reaction occurring within the last 10 years: No If all of the above answers are "NO", then may  proceed with Cephalosporin use.    Home Medications:  (Not in a hospital admission) Home medication reconciliation was completed with the patient.   Scheduled Inpatient Medications:     Continuous Inpatient Infusions:   . ciprofloxacin    . metronidazole      PRN Inpatient Medications:    Family History: family history includes Kidney disease in his sister; Throat cancer in his father. There is no history of Colon cancer or Prostate cancer.    Social History:   reports that he quit smoking about 51 years ago. He has never  used smokeless tobacco. He reports that he does not drink alcohol or use illicit drugs.   Review of Systems: Constitutional: Weight is stable.  Eyes: No changes in vision. ENT: No oral lesions, sore throat.  GI: see HPI.  Heme/Lymph: No easy bruising.  CV: No chest pain.  GU: No hematuria.  Integumentary: No rashes.  Neuro: No headaches.  Psych: No depression/anxiety.  Endocrine: No heat/cold intolerance.  Allergic/Immunologic: No urticaria.  Resp: No cough, SOB.  Musculoskeletal: No joint swelling.    Physical Examination: BP 118/52 mmHg  Pulse 79  Temp(Src) 96.3 F (35.7 C) (Axillary)  Resp 18  Ht 5\' 6"  (1.676 m)  Wt 63.504 kg (140 lb)  BMI 22.61 kg/m2  SpO2 100% Gen: NAD, alert and oriented x 4 HEENT: PEERLA, EOMI, Neck: supple, no JVD or thyromegaly Chest: CTA bilaterally, no wheezes, crackles, or other adventitious sounds CV: RRR, no m/g/c/r Abd: soft, NT, ND, +BS in all four quadrants; no HSM, guarding, ridigity, or rebound tenderness Ext: no edema, well perfused with 2+ pulses, Skin: no rash or lesions noted Lymph: no LAD  Data: Lab Results  Component Value Date   WBC 14.2* 06/08/2015   HGB 12.2* 06/08/2015   HCT 36.3* 06/08/2015   MCV 89.9 06/08/2015   PLT 215 06/08/2015    Recent Labs Lab 06/08/15 0958  HGB 12.2*   Lab Results  Component Value Date   NA 136 06/08/2015   K 4.2 06/08/2015   CL 103 06/08/2015   CO2 25 06/08/2015   BUN 20 06/08/2015   CREATININE 1.04 06/08/2015   Lab Results  Component Value Date   ALT 21 06/08/2015   AST 27 06/08/2015   ALKPHOS 69 06/08/2015   BILITOT 0.8 06/08/2015    Recent Labs Lab 06/08/15 0958  INR 2.70   Assessment/Plan: Tyler Stout is a 82 y.o. male with a history of Afib on Coumadin, CAD (Tyler/p CABG in 1991), HTN, HLD, GERD, diverticulosis, and IDA admitted with a lower GI bleed.  He reports 4-5 episodes of BRB intermixed with stool and in commode water this morning.  WBCs 14.2; Hgb presently  stable at 12.2, INR 2.7.  VSS, including BP (~118/52), presently stable as well. CT a/p demonstrated mild concentric wall thickening in the mid-ascending colon and mild pericolonic soft tissue stranding; this is concerning for diverticulitis vs malignancy.  Patient continues to report BRB with loose BMs (2 episodes since admission), but volume is decreasing per patient.  Will repeat CBC at this time to monitor Hgb.  Will also repeat CBC and obtain CEA in the morning.  Further recs pending these results.  Recommendations: - Repeat CBC now - CBC and CEA with am labs - Continue IV protonix, IV cipro and flagyl - Further recs pending these results  Thank you for the consult. We will follow along with you. Please call with questions or concerns.  Lavera Guise, PA-C [facility] Phone: [phone] Pager: [phone]

## 2015-06-08 NOTE — Progress Notes (Signed)
Received report from [place], ED RN. Pt. Currently with Larene Beach, RN @ bleeding scan currently.  Will assume care once pt. Arrives in ICU.

## 2015-06-08 NOTE — Progress Notes (Addendum)
[name]: Tyler Stout, male   DOB: [birth year], 82 y.o.   MRN: [id number] Patient became hypotensive and had a syncopal episode without losing full consciousness in the emergency room he appears pale he had a large rectal bleed and blood pressure dropped down into the systolics [id number]. 500 cc of IV normal saline bolus was given. Patient was seen by Dr. Vira Agar in the emergency room. We'll reverse his INR and get a stat GI bleeding scan. We'll check a stat H&H and transfuse if needed. Patient will be Admitted in the ICU stepdown unit. He may need IV pressors for his low blood pressure. Critical time 30 mins D/w pt,wife and dr Tiffany Kocher  Spoke with Dr. Stevenson Clinch  from ICU. Patient will be seen by him. We'll transfuse 2 units of FFP given vitamin K and give 1 unit of blood transfusion.  Will start on IV levophed.

## 2015-06-09 ENCOUNTER — Inpatient Hospital Stay (HOSPITAL_COMMUNITY): Payer: Medicare HMO

## 2015-06-09 ENCOUNTER — Inpatient Hospital Stay (HOSPITAL_COMMUNITY)
Admission: AD | Admit: 2015-06-09 | Discharge: 2015-06-14 | DRG: 378 | Disposition: A | Discharge status: Home / Self Care | Payer: Medicare HMO | Source: Other Acute Inpatient Hospital | Attending: Internal Medicine | Admitting: Internal Medicine

## 2015-06-09 DIAGNOSIS — N401 Enlarged prostate with lower urinary tract symptoms: Secondary | ICD-10-CM | POA: Diagnosis present

## 2015-06-09 DIAGNOSIS — I959 Hypotension, unspecified: Secondary | ICD-10-CM | POA: Diagnosis not present

## 2015-06-09 DIAGNOSIS — E876 Hypokalemia: Secondary | ICD-10-CM | POA: Diagnosis not present

## 2015-06-09 DIAGNOSIS — T45515A Adverse effect of anticoagulants, initial encounter: Secondary | ICD-10-CM | POA: Diagnosis present

## 2015-06-09 DIAGNOSIS — K922 Gastrointestinal hemorrhage, unspecified: Secondary | ICD-10-CM | POA: Diagnosis present

## 2015-06-09 DIAGNOSIS — D689 Coagulation defect, unspecified: Secondary | ICD-10-CM | POA: Diagnosis present

## 2015-06-09 DIAGNOSIS — D62 Acute posthemorrhagic anemia: Secondary | ICD-10-CM | POA: Diagnosis present

## 2015-06-09 DIAGNOSIS — I25810 Atherosclerosis of coronary artery bypass graft(s) without angina pectoris: Secondary | ICD-10-CM | POA: Diagnosis not present

## 2015-06-09 DIAGNOSIS — R339 Retention of urine, unspecified: Secondary | ICD-10-CM | POA: Diagnosis present

## 2015-06-09 DIAGNOSIS — Z7901 Long term (current) use of anticoagulants: Secondary | ICD-10-CM | POA: Diagnosis not present

## 2015-06-09 DIAGNOSIS — I340 Nonrheumatic mitral (valve) insufficiency: Secondary | ICD-10-CM | POA: Diagnosis not present

## 2015-06-09 DIAGNOSIS — N400 Enlarged prostate without lower urinary tract symptoms: Secondary | ICD-10-CM | POA: Diagnosis present

## 2015-06-09 DIAGNOSIS — R778 Other specified abnormalities of plasma proteins: Secondary | ICD-10-CM | POA: Diagnosis present

## 2015-06-09 DIAGNOSIS — E785 Hyperlipidemia, unspecified: Secondary | ICD-10-CM | POA: Diagnosis present

## 2015-06-09 DIAGNOSIS — I2510 Atherosclerotic heart disease of native coronary artery without angina pectoris: Secondary | ICD-10-CM | POA: Diagnosis present

## 2015-06-09 DIAGNOSIS — I10 Essential (primary) hypertension: Secondary | ICD-10-CM | POA: Diagnosis present

## 2015-06-09 DIAGNOSIS — D696 Thrombocytopenia, unspecified: Secondary | ICD-10-CM | POA: Diagnosis not present

## 2015-06-09 DIAGNOSIS — R7989 Other specified abnormal findings of blood chemistry: Secondary | ICD-10-CM

## 2015-06-09 DIAGNOSIS — Z87891 Personal history of nicotine dependence: Secondary | ICD-10-CM | POA: Diagnosis not present

## 2015-06-09 DIAGNOSIS — I482 Chronic atrial fibrillation, unspecified: Secondary | ICD-10-CM | POA: Diagnosis present

## 2015-06-09 DIAGNOSIS — K5791 Diverticulosis of intestine, part unspecified, without perforation or abscess with bleeding: Principal | ICD-10-CM | POA: Diagnosis present

## 2015-06-09 DIAGNOSIS — I248 Other forms of acute ischemic heart disease: Secondary | ICD-10-CM | POA: Diagnosis not present

## 2015-06-09 DIAGNOSIS — I4891 Unspecified atrial fibrillation: Secondary | ICD-10-CM | POA: Diagnosis present

## 2015-06-09 DIAGNOSIS — R579 Shock, unspecified: Secondary | ICD-10-CM | POA: Diagnosis not present

## 2015-06-09 DIAGNOSIS — Z951 Presence of aortocoronary bypass graft: Secondary | ICD-10-CM | POA: Diagnosis not present

## 2015-06-09 DIAGNOSIS — R338 Other retention of urine: Secondary | ICD-10-CM | POA: Diagnosis present

## 2015-06-09 DIAGNOSIS — K254 Chronic or unspecified gastric ulcer with hemorrhage: Secondary | ICD-10-CM

## 2015-06-09 DIAGNOSIS — K5793 Diverticulitis of intestine, part unspecified, without perforation or abscess with bleeding: Secondary | ICD-10-CM

## 2015-06-09 DIAGNOSIS — K921 Melena: Secondary | ICD-10-CM | POA: Diagnosis present

## 2015-06-09 DIAGNOSIS — G20 Parkinson's disease: Secondary | ICD-10-CM | POA: Diagnosis present

## 2015-06-09 DIAGNOSIS — K5731 Diverticulosis of large intestine without perforation or abscess with bleeding: Secondary | ICD-10-CM | POA: Diagnosis not present

## 2015-06-09 DIAGNOSIS — R578 Other shock: Secondary | ICD-10-CM | POA: Diagnosis present

## 2015-06-09 LAB — CBC
HCT: 28.7 % — ABNORMAL LOW (ref 39.0–52.0)
HCT: 22 % — ABNORMAL LOW (ref 39.0–52.0)
HEMATOCRIT: 32.3 % — AB (ref 39.0–52.0)
HEMATOCRIT: 22.4 % — AB (ref 39.0–52.0)
HEMATOCRIT: 23.9 % — AB (ref 39.0–52.0)
HEMOGLOBIN: 10.8 g/dL — AB (ref 13.0–17.0)
HEMOGLOBIN: 10.1 g/dL — AB (ref 13.0–17.0)
HEMOGLOBIN: 7.8 g/dL — AB (ref 13.0–17.0)
HEMOGLOBIN: 8.3 g/dL — AB (ref 13.0–17.0)
Hemoglobin: 7.9 g/dL — ABNORMAL LOW (ref 13.0–17.0)
MCH: 29.2 pg (ref 26.0–34.0)
MCH: 30 pg (ref 26.0–34.0)
MCH: 29.3 pg (ref 26.0–34.0)
MCH: 29.7 pg (ref 26.0–34.0)
MCH: 29.4 pg (ref 26.0–34.0)
MCHC: 33.4 g/dL (ref 30.0–36.0)
MCHC: 35.2 g/dL (ref 30.0–36.0)
MCHC: 35.3 g/dL (ref 30.0–36.0)
MCHC: 35.5 g/dL (ref 30.0–36.0)
MCHC: 34.7 g/dL (ref 30.0–36.0)
MCV: 87.3 fL (ref 78.0–100.0)
MCV: 85.2 fL (ref 78.0–100.0)
MCV: 83 fL (ref 78.0–100.0)
MCV: 83.7 fL (ref 78.0–100.0)
MCV: 84.8 fL (ref 78.0–100.0)
PLATELETS: 90 10*3/uL — AB (ref 150–400)
PLATELETS: 84 10*3/uL — AB (ref 150–400)
Platelets: 102 10*3/uL — ABNORMAL LOW (ref 150–400)
Platelets: 78 10*3/uL — ABNORMAL LOW (ref 150–400)
Platelets: 81 10*3/uL — ABNORMAL LOW (ref 150–400)
RBC: 3.7 MIL/uL — ABNORMAL LOW (ref 4.22–5.81)
RBC: 3.37 MIL/uL — AB (ref 4.22–5.81)
RBC: 2.7 MIL/uL — ABNORMAL LOW (ref 4.22–5.81)
RBC: 2.63 MIL/uL — AB (ref 4.22–5.81)
RBC: 2.82 MIL/uL — AB (ref 4.22–5.81)
RDW: 15 % (ref 11.5–15.5)
RDW: 14.9 % (ref 11.5–15.5)
RDW: 15.1 % (ref 11.5–15.5)
RDW: 15.2 % (ref 11.5–15.5)
RDW: 15.4 % (ref 11.5–15.5)
WBC: 24.7 10*3/uL — AB (ref 4.0–10.5)
WBC: 21.2 10*3/uL — ABNORMAL HIGH (ref 4.0–10.5)
WBC: 12.3 10*3/uL — AB (ref 4.0–10.5)
WBC: 10.4 10*3/uL (ref 4.0–10.5)
WBC: 9.7 10*3/uL (ref 4.0–10.5)

## 2015-06-09 LAB — BASIC METABOLIC PANEL
ANION GAP: 6 (ref 5–15)
ANION GAP: 8 (ref 5–15)
BUN: 14 mg/dL (ref 6–20)
BUN: 12 mg/dL (ref 6–20)
CALCIUM: 7.3 mg/dL — AB (ref 8.9–10.3)
CO2: 20 mmol/L — ABNORMAL LOW (ref 22–32)
CO2: 22 mmol/L (ref 22–32)
Calcium: 7.1 mg/dL — ABNORMAL LOW (ref 8.9–10.3)
Chloride: 110 mmol/L (ref 101–111)
Chloride: 105 mmol/L (ref 101–111)
Creatinine, Ser: 0.94 mg/dL (ref 0.61–1.24)
Creatinine, Ser: 0.77 mg/dL (ref 0.61–1.24)
GFR calc Af Amer: >60 mL/min (ref >60)
GLUCOSE: 192 mg/dL — AB (ref 65–99)
Glucose, Bld: 114 mg/dL — ABNORMAL HIGH (ref 65–99)
POTASSIUM: 3.7 mmol/L (ref 3.5–5.1)
POTASSIUM: 3.4 mmol/L — AB (ref 3.5–5.1)
SODIUM: 136 mmol/L (ref 135–145)
SODIUM: 135 mmol/L (ref 135–145)

## 2015-06-09 LAB — TYPE AND SCREEN
ABO/RH(D): O POS
Antibody Screen: NEGATIVE
UNIT DIVISION: 0
Unit division: 0
Unit division: 0
Unit division: 0

## 2015-06-09 LAB — MAGNESIUM: MAGNESIUM: 1.4 mg/dL — AB (ref 1.7–2.4)

## 2015-06-09 LAB — PREPARE FRESH FROZEN PLASMA
UNIT DIVISION: 0
Unit division: 0

## 2015-06-09 LAB — PROTIME-INR
INR: 1.95 — AB (ref 0.00–1.49)
INR: 1.59 — ABNORMAL HIGH (ref 0.00–1.49)
Prothrombin Time: 22.1 seconds — ABNORMAL HIGH (ref 11.6–15.2)
Prothrombin Time: 19 seconds — ABNORMAL HIGH (ref 11.6–15.2)

## 2015-06-09 LAB — ABO/RH: ABO/RH(D): O POS

## 2015-06-09 LAB — TROPONIN I
TROPONIN I: 0.13 ng/mL — AB (ref <0.031)
Troponin I: 0.1 ng/mL — ABNORMAL HIGH (ref <0.031)
Troponin I: 0.11 ng/mL — ABNORMAL HIGH (ref <0.031)

## 2015-06-09 LAB — GLUCOSE, CAPILLARY: GLUCOSE-CAPILLARY: 224 mg/dL — AB (ref 65–99)

## 2015-06-09 LAB — APTT
APTT: 34 s (ref 24–37)
aPTT: 34 seconds (ref 24–37)

## 2015-06-09 LAB — PHOSPHORUS: PHOSPHORUS: 2 mg/dL — AB (ref 2.5–4.6)

## 2015-06-09 MED ORDER — LIDOCAINE HCL 1 % IJ SOLN
INTRAMUSCULAR | Status: AC
  Administered 2015-06-09: 10 mL
  Filled 2015-06-09: qty 20

## 2015-06-09 MED ORDER — DIPHENHYDRAMINE HCL 25 MG PO CAPS
25.0000 mg | ORAL_CAPSULE | Freq: Four times a day (QID) | ORAL | Status: DC | PRN
  Administered 2015-06-09: 25 mg via ORAL
  Filled 2015-06-09: qty 1

## 2015-06-09 MED ORDER — NOREPINEPHRINE BITARTRATE 1 MG/ML IV SOLN: 0.0000 ug/min | INTRAVENOUS | Status: DC

## 2015-06-09 MED ORDER — CARBIDOPA-LEVODOPA 25-100 MG PO TABS
1.0000 | ORAL_TABLET | Freq: Three times a day (TID) | ORAL | Status: DC
  Administered 2015-06-09: 1 via ORAL
  Filled 2015-06-09 (×5): qty 1

## 2015-06-09 MED ORDER — PANTOPRAZOLE SODIUM 40 MG IV SOLR
40.0000 mg | Freq: Every day | INTRAVENOUS | Status: DC
  Administered 2015-06-09 (×2): 40 mg via INTRAVENOUS
  Filled 2015-06-09 (×2): qty 40

## 2015-06-09 MED ORDER — NOREPINEPHRINE BITARTRATE 1 MG/ML IV SOLN
0.0000 ug/min | INTRAVENOUS | Status: DC
  Administered 2015-06-09: 10 ug/min via INTRAVENOUS
  Filled 2015-06-09: qty 4

## 2015-06-09 MED ORDER — SODIUM CHLORIDE 0.9 % IV SOLN
Freq: Once | INTRAVENOUS | Status: AC
  Administered 2015-06-09: 10 mL/h via INTRAVENOUS

## 2015-06-09 MED ORDER — MIDAZOLAM HCL 2 MG/2ML IJ SOLN
INTRAMUSCULAR | Status: AC
  Filled 2015-06-09: qty 2

## 2015-06-09 MED ORDER — TAMSULOSIN HCL 0.4 MG PO CAPS
0.4000 mg | ORAL_CAPSULE | Freq: Every day | ORAL | Status: DC
  Administered 2015-06-09 – 2015-06-13 (×5): 0.4 mg via ORAL
  Filled 2015-06-09 (×5): qty 1

## 2015-06-09 MED ORDER — SODIUM CHLORIDE 0.9 % IV SOLN: Freq: Once | INTRAVENOUS | Status: AC

## 2015-06-09 MED ORDER — SODIUM CHLORIDE 0.9 % IV SOLN: 250.0000 mL | INTRAVENOUS | Status: DC | PRN

## 2015-06-09 MED ORDER — PANTOPRAZOLE SODIUM 40 MG IV SOLR: 40.0000 mg | Freq: Two times a day (BID) | INTRAVENOUS | Status: DC

## 2015-06-09 MED ORDER — FENTANYL CITRATE (PF) 100 MCG/2ML IJ SOLN
INTRAMUSCULAR | Status: AC
  Filled 2015-06-09: qty 2

## 2015-06-09 MED ORDER — LATANOPROST 0.005 % OP SOLN
1.0000 [drp] | Freq: Every day | OPHTHALMIC | Status: DC
  Administered 2015-06-09 – 2015-06-13 (×5): 1 [drp] via OPHTHALMIC
  Filled 2015-06-09 (×2): qty 2.5

## 2015-06-09 MED ORDER — PHYTONADIONE 5 MG PO TABS
5.0000 mg | ORAL_TABLET | Freq: Once | ORAL | Status: AC
  Administered 2015-06-09: 5 mg via ORAL
  Filled 2015-06-09: qty 1

## 2015-06-09 MED ORDER — IOPAMIDOL (ISOVUE-300) INJECTION 61%
INTRAVENOUS | Status: AC
  Administered 2015-06-09: 35 mL
  Filled 2015-06-09: qty 100

## 2015-06-09 MED ORDER — IOHEXOL 300 MG/ML  SOLN
150.0000 mL | Freq: Once | INTRAMUSCULAR | Status: AC | PRN
  Administered 2015-06-09: 105 mL via INTRAVENOUS

## 2015-06-09 MED ORDER — NOREPINEPHRINE 4 MG/250ML-% IV SOLN
INTRAVENOUS | Status: AC
  Filled 2015-06-09: qty 250

## 2015-06-09 MED ORDER — SODIUM CHLORIDE 0.9 % IV SOLN
INTRAVENOUS | Status: DC
  Administered 2015-06-09: 15:00:00 via INTRA_ARTERIAL
  Administered 2015-06-09: 10 mL/h via INTRA_ARTERIAL

## 2015-06-09 NOTE — Sedation Documentation (Signed)
Patient denies pain and is resting comfortably.  

## 2015-06-09 NOTE — Progress Notes (Signed)
[facility] Progress Note Patient Name: Tyler Stout DOB: 11/18/1925 MRN: [id number]   Date of Service  06/09/2015  HPI/Events of Note  Difficulty urinating. Takes Flomax at home. BP = 118/56.  eICU Interventions  Will order: 1. Flomax 0.4 mg PO Q day after supper. 2. Watch BP closely on Flomax.      Intervention Category Intermediate Interventions: Other:  Lysle Dingwall 06/09/2015, 11:17 PM

## 2015-06-09 NOTE — Progress Notes (Signed)
PULMONARY / CRITICAL CARE MEDICINE   Name: Tyler Stout MRN: [id number] DOB: [birth year]    ADMISSION DATE:  06/09/2015  CHIEF COMPLAINT:  GIB  HISTORY OF PRESENT ILLNESS:   82M with h/o CAD, Colonic diverticuli, gastric AVM clipped 2015, Afib on warfarin.  He states that for the last month he has been nauseated, fatigued, SOB with intermittent chest pain.  At about 6AM yesterday he had a loose BM that was bloody, he then had 3 more BMs at home that were progressively bloody.  His family took him to [place] and he had 10 blood BMs there.  He denies NSAID, Aspirin or Goody's powder use.  No EtOH, and no trauma. At OSH he received 1mg  PO Vit K, 2U FFP and 4U PRBC for a Hg drop of 12 to 8.7 then 7.7 in 15hrs.    SUBJECTIVE: No further visible bleeding. Patient underwent angiogram last night without obvious source of bleeding or abnormality. Denies any chest pain or pressure. No nausea, emesis, or abdominal pain.  REVIEW OF SYSTEMS:  No fever, chills, or sweats. No dyspnea or coughing.  VITAL SIGNS: Temp:  [96.3 F (35.7 C)-98.7 F (37.1 C)] 98.7 F (37.1 C) (05/27 0700) Pulse Rate:  [63-130] 100 (05/27 0815) Resp:  [12-32] 17 (05/27 0815) BP: (64-145)/(39-96) 88/49 mmHg (05/27 0815) SpO2:  [84 %-100 %] 99 % (05/27 0815) Arterial Line BP: (98-146)/(43-65) 107/48 mmHg (05/27 0815) FiO2 (%):  [40 %] 40 % (05/26 2155) Weight:  [140 lb (63.504 kg)-149 lb 4 oz (67.7 kg)] 149 lb 4 oz (67.7 kg) (05/27 0443) HEMODYNAMICS:   VENTILATOR SETTINGS: Vent Mode:  [-]  FiO2 (%):  [40 %] 40 % INTAKE / OUTPUT:  Intake/Output Summary (Last 24 hours) at 06/09/15 0824 Last data filed at 06/09/15 0800  Gross per 24 hour  Intake 1201.32 ml  Output    400 ml  Net 801.32 ml    PHYSICAL EXAMINATION: General:  Awake. Laying recumbent in bed. No distress. Neuro:  CN 2-12 in tact. Grossly nonfocal. Moving all 4 extremities.  HEENT:  Moist membranes. No scleral injection or icterus. Cardiovascular:   Regular rate. Irregular rhythm. No edema. Lungs:  Cl;ear to auscultation bilaterally. Normal work of breathing. Abdomen:  Soft. Nontender. Nondistended. Normal bowel sounds. Integument:  Warm & dry. No rash.  LABS:  CBC  Recent Labs Lab 06/08/15 1615 06/08/15 2051 06/09/15 0203 06/09/15 0435  WBC 10.0  --  24.7* 21.2*  HGB 8.7* 7.7* 10.8* 10.1*  HCT 26.1*  --  32.3* 28.7*  PLT 187  --  102* 90*   Coag's  Recent Labs Lab 06/08/15 0958 06/08/15 2159 06/09/15 0430  APTT  --   --  34  INR 2.70 2.79 1.95*   BMET  Recent Labs Lab 06/08/15 0958 06/09/15 0435  NA 136 136  K 4.2 3.7  CL 103 110  CO2 25 20*  BUN 20 14  CREATININE 1.04 0.94  GLUCOSE 185* 192*   Electrolytes  Recent Labs Lab 06/08/15 0958 06/09/15 0435  CALCIUM 8.7* 7.3*  MG  --  1.4*  PHOS  --  2.0*   Sepsis Markers No results for input(s): LATICACIDVEN, PROCALCITON, O2SATVEN in the last 168 hours. ABG No results for input(s): PHART, PCO2ART, PO2ART in the last 168 hours. Liver Enzymes  Recent Labs Lab 06/08/15 0958  AST 27  ALT 21  ALKPHOS 69  BILITOT 0.8  ALBUMIN 3.7   Cardiac Enzymes No results for input(s): TROPONINI, PROBNP in the  last 168 hours. Glucose  Recent Labs Lab 06/08/15 2142 06/09/15 0144  GLUCAP 159* 224*    Imaging Nm Gi Blood Loss  06/08/2015  CLINICAL DATA:  Rectal bleeding.  Bright red blood per rectum. EXAM: NUCLEAR MEDICINE GASTROINTESTINAL BLEEDING SCAN TECHNIQUE: Sequential abdominal images were obtained following intravenous administration of Tc-24m labeled red blood cells. RADIOPHARMACEUTICALS:  22.5 mCi Tc-36m in-vitro labeled red cells. COMPARISON:  CT of earlier today FINDINGS: Active bleeding is identified, originating in the right lower abdomen. Favored to be within the cecum. There may be minimal reflux into the terminal ileum. Tracer/ hemorrhage is seen subsequently in the transverse and descending colon. IMPRESSION: Active hemorrhage, likely  originating within the cecum. These results will be called to the ordering clinician or representative by the Radiologist Assistant, and communication documented in the PACS or zVision Dashboard. Electronically Signed   By: Abigail Miyamoto M.D.   On: 06/08/2015 22:37   Ct Abdomen Pelvis W Contrast  06/08/2015  CLINICAL DATA:  Bright rectal bleeding since 5 a.m. today. Previous cholecystectomy, appendectomy and CABG. EXAM: CT ABDOMEN AND PELVIS WITH CONTRAST TECHNIQUE: Multidetector CT imaging of the abdomen and pelvis was performed using the standard protocol following bolus administration of intravenous contrast. CONTRAST:  167mL ISOVUE-300 IOPAMIDOL (ISOVUE-300) INJECTION 61% COMPARISON:  11/17/2006. FINDINGS: Lower chest:  Stable minimal linear scarring at both lung bases. Hepatobiliary: Mild diffuse low density of the liver relative to the spleen. Cholecystectomy clips. Pancreas: Moderate diffuse pancreatic atrophy with progression and associated pancreatic ductal dilatation. Spleen: Multiple calcified granulomas.  Normal in size. Adrenals/Urinary Tract: Small bilateral renal cysts. Normal appearing adrenal glands and ureters. Mild diffuse bladder wall thickening. The prostate gland is protruding into the bladder base. Stomach/Bowel: Multiple diverticula throughout the colon. Mild concentric wall thickening and lack of distention of the midportion of the ascending colon, measuring 5 cm in length. Mild adjacent pericolonic soft tissue stranding proximally. Surgically absent appendix. Small sliding hiatal hernia. No small bowel abnormalities. Vascular/Lymphatic: Atheromatous arterial calcifications. No aneurysm or enlarged lymph nodes. Reproductive: Moderately to markedly enlarged and heterogeneous prostate gland with protrusion into the base of the urinary bladder. Other: Small bilateral inguinal hernias containing fat. Musculoskeletal: Lumbar and lower thoracic spine degenerative changes. IMPRESSION: 1. Mild  concentric wall thickening and lack of distention of the midportion of the ascending colon, measuring 5 cm in length, in an area of multiple diverticula and mild pericolonic soft tissue stranding. This is most likely due to mild diverticulitis. A colonic neoplasm is less likely but not excluded. 2. Extensive diverticulosis elsewhere throughout the colon. 3. Small sliding hiatal hernia. 4. Moderate diffuse pancreatic atrophy. 5. Mild diffuse hepatic steatosis. 6. Moderately to markedly enlarged and heterogeneous prostate gland. 7. Mild diffuse bladder wall thickening, compatible with chronic bladder outlet obstruction due to the enlarged prostate gland. Electronically Signed   By: Claudie Revering M.D.   On: 06/08/2015 13:43   EVENTS: 5/27 - Transfer from OSH for embolization  STUDIES: CT Abd/Pelvis W/ 5/26:  Multiple diverticula in ascending colon with mild pericolonic soft tisue stranding & concentric wall thickening. Extensive diverticulosis elsewhere throughout the colon. Small sliding hiatal hernia. Enlarged prostate. Bleeding Scan 5/26:  Active bleeding in region of cecum. Mesenteric Angiogram 5/27:  No bleeding or suspicious vascular lesion.  MICROBIOLOGY: MRSA PCR 5/26:  Negative  ANTIBIOTICS: None  LINES/TUBES: R Fem CVL 5/26 >> PIV x2  ASSESSMENT / PLAN:  PULMONARY A: No acute issues.  P:   Continuous Pulse Ox.  CARDIOVASCULAR A:  Shock - Hemorraghic. H/O CAD H/O Atrial Fibrillation - Currently rate controlled. Previously on Coumadin?.  P:  Vitals per unit protocol Monitor on telemetry Holding Metoprolol Weaning Levophed for MAP >65 Trending Troponin I  RENAL A:   H/O BPH H/O Urinary retention  P:   Foley catheter Monitoring UOP Trending electrolytes & renal function daily  GASTROINTESTINAL A:   LGIB - Likely diverticular.  P:   NPO except meds Plan to start Clear liquid diet if Hgb remains stable & no further bleeding Protonix IV  daily  HEMATOLOGIC A:   Anemia - Secondary to blood loss. Coagulopathy - Reportedly secondary to Coumadin.  P:  Trending Hgb/Hct q4hr for now Transfuse for Hgb <7.0 S/P FFP, Vitamin K & PRBC transfusions. SCDs Repeat INR /PTT now Repeat Coags daily  INFECTIOUS A:   No acute issues.  P:   Monitor for fever & leukocytosis.  ENDOCRINE A:   No acute issues.  P:   Monitor blood glucose on daily labs.  NEUROLOGIC A:   H/O Parkinson's Disease.  P:   Continuing Home Sinemet   FAMILY  - Updates: No family at bedside.   TODAY'S SUMMARY:  82 year old male with probable diverticular bleed. Mesenteric angiogram negative for bleeding source. Patient has no symptomatology that would be consistent with diverticulitis therefore I'm holding on antibiotics at this time. Continuing to monitor for repeat bleeding closely. Cautiously starting clear liquid diet today.  I have spent an additional 36 minutes of critical care time today caring for the patient and reviewing the patient's electronic medical record.  Sonia Baller Ashok Cordia, M.D. [facility] Pager:  [phone] After 3pm or if no response, call [phone] 8:24 AM 06/09/2015

## 2015-06-09 NOTE — Progress Notes (Signed)
[facility] Progress Note Patient Name: Tyler Stout DOB: [birth year] MRN: [id number]   Date of Service  06/09/2015  HPI/Events of Note  Platelets = 90K. Patient was not found to be actively bleeding on arteriogram for possible embolization.   eICU Interventions  Will follow serial platelet counts.     Intervention Category Intermediate Interventions: Coagulopathy - evaluation and management  Juanita Streight [place] 06/09/2015, 5:55 AM

## 2015-06-09 NOTE — Progress Notes (Signed)
Pt c/o itching post FFP.  Administered 24mg  PO Benadryl, continued scheduled FFP per Dr. Wallis Bamberg.

## 2015-06-09 NOTE — Procedures (Signed)
3-vessel mesenteric arteriogram: no active bleeding or suspicious vascular lesion No complication No blood loss. See complete dictation in [facility]. L CFA sheath left in place, can be used as art line, will remove once INR <1.7

## 2015-06-09 NOTE — H&P (Signed)
PULMONARY / CRITICAL CARE MEDICINE   Name: Tyler Stout MRN: [id number] DOB: 07-05-1925    ADMISSION DATE:  06/09/2015  CHIEF COMPLAINT:  GIB  HISTORY OF PRESENT ILLNESS:   72M with h/o CAD, Colonic diverticuli, gastric AVM clipped 2015, Afib on warfarin.  He states that for the last month he has been nauseated, fatigued, SOB with intermittent chest pain.  At about 6AM yesterday he had a loose BM that was bloody, he then had 3 more BMs at home that were progressively bloody.  His family took him to [place] and he had 10 blood BMs there.  He denies NSAID, Aspirin or Goody's powder use.  No EtOH, and no trauma.  At OSH he received 1mg  PO Vit K, 2U FFP and 4U PRBC for a Hg drop of 12 to 8.7 then 7.7 in 15hrs.    PAST MEDICAL HISTORY :   has a past medical history of CAD (coronary artery disease); GERD (gastroesophageal reflux disease); Atrial fibrillation ([place]); Anemia; Hypertension; Hypercholesterolemia; Pancreatitis; Bilateral renal cysts; Glaucoma; BPH (benign prostatic hypertrophy); Diverticulosis; and Arthritis.  has past surgical history that includes Appendectomy; Coronary artery bypass graft; Cholecystectomy (1994); Inguinal hernia repair (2009); and open heart surgery (1991). Prior to Admission medications   Medication Sig Start Date End Date Taking? Authorizing Provider  atorvastatin (LIPITOR) 20 MG tablet Take 20 mg by mouth at bedtime.    Historical Provider, MD  Calcium Carbonate-Vitamin D (CALCIUM 600+D) 600-400 MG-UNIT per tablet Take 1 tablet by mouth daily.    Historical Provider, MD  carbidopa-levodopa (SINEMET IR) 25-100 MG tablet Take 1 tablet by mouth 3 (three) times daily.    Historical Provider, MD  ciprofloxacin (CIPRO) 400 MG/200ML SOLN Inject 200 mLs (400 mg total) into the vein 2 (two) times daily. 06/09/15   Fritzi Mandes, MD  HYDROcodone-acetaminophen (NORCO/VICODIN) 5-325 MG tablet Take 1 tablet by mouth every 6 (six) hours as needed for moderate pain.     Historical  Provider, MD  latanoprost (XALATAN) 0.005 % ophthalmic solution Place 1 drop into both eyes at bedtime.     Historical Provider, MD  metroNIDAZOLE (FLAGYL) 5-0.79 MG/ML-% IVPB Inject 100 mLs (500 mg total) into the vein every 8 (eight) hours. 06/08/15   Fritzi Mandes, MD  morphine 2 MG/ML injection Inject 0.5 mLs (1 mg total) into the vein every 4 (four) hours as needed. 06/08/15   Fritzi Mandes, MD  norepinephrine 4 mg in dextrose 5 % 250 mL Inject 0-40 mcg/min into the vein continuous. 06/08/15   Fritzi Mandes, MD  tamsulosin (FLOMAX) 0.4 MG CAPS capsule Take 0.4 mg by mouth daily after supper.     Historical Provider, MD   Allergies  Allergen Reactions  . Ilosone [Erythromycin] Other (See Comments)    Reaction:  GI upset   . Penicillins Rash and Other (See Comments)    Has patient had a PCN reaction causing immediate rash, facial/tongue/throat swelling, SOB or lightheadedness with hypotension: No Has patient had a PCN reaction causing severe rash involving mucus membranes or skin necrosis: No Has patient had a PCN reaction that required hospitalization No Has patient had a PCN reaction occurring within the last 10 years: No If all of the above answers are "NO", then may proceed with Cephalosporin use.    FAMILY HISTORY:  has no family status information on file.  SOCIAL HISTORY:  reports that he quit smoking about 51 years ago. He has never used smokeless tobacco. He reports that he does not drink alcohol  or use illicit drugs.  REVIEW OF SYSTEMS:   + Bloody BM, Nausea, loose stool, dry mouth Denies: CP, SOB, vomiting, hematemesis, numbness, slurred speach  SUBJECTIVE:   VITAL SIGNS: Temp:  [96.3 F (35.7 C)-98.7 F (37.1 C)] 97.9 F (36.6 C) (05/27 0150) Pulse Rate:  [63-130] 120 (05/27 0150) Resp:  [17-32] 20 (05/27 0150) BP: (64-142)/(41-96) 142/62 mmHg (05/27 0150) SpO2:  [84 %-100 %] 100 % (05/27 0150) FiO2 (%):  [40 %] 40 % (05/26 2155) Weight:  [63.504 kg (140 lb)] 63.504 kg (140  lb) (05/26 0951) HEMODYNAMICS:   VENTILATOR SETTINGS: Vent Mode:  [-]  FiO2 (%):  [40 %] 40 % INTAKE / OUTPUT: No intake or output data in the 24 hours ending 06/09/15 0216  PHYSICAL EXAMINATION: General:  NAD Neuro:  CN II-XII intact, no ridgidity HEENT:  MM dry.  PERLA Cardiovascular:  Tachycardic, regular, no m/r/g Lungs:  CTA b/l no w/r/r Abdomen:  Soft, non tender, mild bladder firmness Musculoskeletal:  Normal bulk and tone Skin:  No c/c/e Rectal: no external or internal hemorrhoids, no blood on DRE  LABS:  CBC  Recent Labs Lab 06/08/15 0958 06/08/15 1615 06/08/15 2051  WBC 14.2* 10.0  --   HGB 12.2* 8.7* 7.7*  HCT 36.3* 26.1*  --   PLT 215 187  --    Coag's  Recent Labs Lab 06/08/15 0958 06/08/15 2159  INR 2.70 2.79   BMET  Recent Labs Lab 06/08/15 0958  NA 136  K 4.2  CL 103  CO2 25  BUN 20  CREATININE 1.04  GLUCOSE 185*   Electrolytes  Recent Labs Lab 06/08/15 0958  CALCIUM 8.7*   Sepsis Markers No results for input(s): LATICACIDVEN, PROCALCITON, O2SATVEN in the last 168 hours. ABG No results for input(s): PHART, PCO2ART, PO2ART in the last 168 hours. Liver Enzymes  Recent Labs Lab 06/08/15 0958  AST 27  ALT 21  ALKPHOS 69  BILITOT 0.8  ALBUMIN 3.7   Cardiac Enzymes No results for input(s): TROPONINI, PROBNP in the last 168 hours. Glucose  Recent Labs Lab 06/08/15 2142 06/09/15 0144  GLUCAP 159* 224*    Imaging Nm Gi Blood Loss  06/08/2015  CLINICAL DATA:  Rectal bleeding.  Bright red blood per rectum. EXAM: NUCLEAR MEDICINE GASTROINTESTINAL BLEEDING SCAN TECHNIQUE: Sequential abdominal images were obtained following intravenous administration of Tc-84m labeled red blood cells. RADIOPHARMACEUTICALS:  22.5 mCi Tc-18m in-vitro labeled red cells. COMPARISON:  CT of earlier today FINDINGS: Active bleeding is identified, originating in the right lower abdomen. Favored to be within the cecum. There may be minimal reflux into  the terminal ileum. Tracer/ hemorrhage is seen subsequently in the transverse and descending colon. IMPRESSION: Active hemorrhage, likely originating within the cecum. These results will be called to the ordering clinician or representative by the Radiologist Assistant, and communication documented in the PACS or zVision Dashboard. Electronically Signed   By: Abigail Miyamoto M.D.   On: 06/08/2015 22:37   Ct Abdomen Pelvis W Contrast  06/08/2015  CLINICAL DATA:  Bright rectal bleeding since 5 a.m. today. Previous cholecystectomy, appendectomy and CABG. EXAM: CT ABDOMEN AND PELVIS WITH CONTRAST TECHNIQUE: Multidetector CT imaging of the abdomen and pelvis was performed using the standard protocol following bolus administration of intravenous contrast. CONTRAST:  131mL ISOVUE-300 IOPAMIDOL (ISOVUE-300) INJECTION 61% COMPARISON:  11/17/2006. FINDINGS: Lower chest:  Stable minimal linear scarring at both lung bases. Hepatobiliary: Mild diffuse low density of the liver relative to the spleen. Cholecystectomy clips. Pancreas: Moderate diffuse  pancreatic atrophy with progression and associated pancreatic ductal dilatation. Spleen: Multiple calcified granulomas.  Normal in size. Adrenals/Urinary Tract: Small bilateral renal cysts. Normal appearing adrenal glands and ureters. Mild diffuse bladder wall thickening. The prostate gland is protruding into the bladder base. Stomach/Bowel: Multiple diverticula throughout the colon. Mild concentric wall thickening and lack of distention of the midportion of the ascending colon, measuring 5 cm in length. Mild adjacent pericolonic soft tissue stranding proximally. Surgically absent appendix. Small sliding hiatal hernia. No small bowel abnormalities. Vascular/Lymphatic: Atheromatous arterial calcifications. No aneurysm or enlarged lymph nodes. Reproductive: Moderately to markedly enlarged and heterogeneous prostate gland with protrusion into the base of the urinary bladder. Other: Small  bilateral inguinal hernias containing fat. Musculoskeletal: Lumbar and lower thoracic spine degenerative changes. IMPRESSION: 1. Mild concentric wall thickening and lack of distention of the midportion of the ascending colon, measuring 5 cm in length, in an area of multiple diverticula and mild pericolonic soft tissue stranding. This is most likely due to mild diverticulitis. A colonic neoplasm is less likely but not excluded. 2. Extensive diverticulosis elsewhere throughout the colon. 3. Small sliding hiatal hernia. 4. Moderate diffuse pancreatic atrophy. 5. Mild diffuse hepatic steatosis. 6. Moderately to markedly enlarged and heterogeneous prostate gland. 7. Mild diffuse bladder wall thickening, compatible with chronic bladder outlet obstruction due to the enlarged prostate gland. Electronically Signed   By: Claudie Revering M.D.   On: 06/08/2015 13:43     ASSESSMENT / PLAN:  PULMONARY A: Not active P:   - supplemental O2 PRN  CARDIOVASCULAR A:  Shock - Hemorraghic  Afib- rate controlled  P:  - s/p 4 U PRBC - Pressure improved - 2 PIV in place - Triple lumen CVC - Levophed - down titrating - holding metoprolol for now  RENAL A:   BPH Urinary retention  P:   Foley catheter  GASTROINTESTINAL A:   LGIB - likely diverticular  P:   - protonix - IR for embolization now - S/p Tagged RBC scan that was + - resuscitation as above  HEMATOLOGIC A:   Acute blood loss anemia P:  - tx as above - transfuse for hb <7 - give 5mg  PO Vit K now - FFP x2 U now for IR   INFECTIOUS A:   None suspected P:     ENDOCRINE A:   Not active   P:   - BG <180  NEUROLOGIC A:   Parkinsons P:   - resume sinemet tomorrow  DVT: SCDs NPO Full Code  FAMILY  - Updates: updated family today at bedside    Total critical care time: 30 min  Critical care time was exclusive of separately billable procedures and treating other patients.  Critical care was necessary to treat or  prevent imminent or life-threatening deterioration.  Critical care was time spent personally by me on the following activities: development of treatment plan with patient and/or surrogate as well as nursing, discussions with consultants, evaluation of patient's response to treatment, examination of patient, obtaining history from patient or surrogate, ordering and performing treatments and interventions, ordering and review of laboratory studies, ordering and review of radiographic studies, pulse oximetry and re-evaluation of patient's condition.   Meribeth Mattes, DO., MS [facility]     Pulmonary and [place] Pager: [phone]  06/09/2015, 2:16 AM

## 2015-06-09 NOTE — Progress Notes (Signed)
2300: Called and gave report to [place], RN on 2MW.   0030: Pt. Transported to [facility].  VSS, 4th unit of RBC's transfusing. Levo gtt, NS and ABX running (see EMR for details). Pt. On 2 L [place]. Pt. HR jumps into 150's frequently but not-sustaining.  Ms. Patria Mane, NP and transport team aware. Gave additional dose of zofran per Ms. Patria Mane, NP verbal instructions to combat pt.'s nausea. Pt. A&O x4. Wife alerted of move and given necessary information r/t transfer.  No other complications at this time.

## 2015-06-09 NOTE — Progress Notes (Signed)
[place] Progress Note Patient Name: Tyler Stout DOB: 1925/07/26 MRN: [id number]   Date of Service  06/09/2015  HPI/Events of Note  Patient c/o itching post first unit of FFP.  eICU Interventions  Will order: 1. Benadryl 25 mg PO Q 6 hours PRN itching.     Intervention Category Intermediate Interventions: Other:  Lysle Dingwall 06/09/2015, 5:53 AM

## 2015-06-09 NOTE — Progress Notes (Signed)
eLink Physician-Brief Progress Note Patient Name: Tyler Stout DOB: [birth year] MRN: [id number]   Date of Service  06/09/2015  HPI/Events of Note  INR = 2.79.   eICU Interventions  Will transfuse 3 units FFP now.      Intervention Category Intermediate Interventions: Coagulopathy - evaluation and management  Denver Harder Eugene 06/09/2015, 1:54 AM

## 2015-06-10 ENCOUNTER — Encounter: Payer: Self-pay | Admitting: Internal Medicine

## 2015-06-10 DIAGNOSIS — K922 Gastrointestinal hemorrhage, unspecified: Secondary | ICD-10-CM

## 2015-06-10 DIAGNOSIS — R634 Abnormal weight loss: Secondary | ICD-10-CM | POA: Insufficient documentation

## 2015-06-10 DIAGNOSIS — I4891 Unspecified atrial fibrillation: Secondary | ICD-10-CM

## 2015-06-10 DIAGNOSIS — R251 Tremor, unspecified: Secondary | ICD-10-CM | POA: Insufficient documentation

## 2015-06-10 DIAGNOSIS — K5731 Diverticulosis of large intestine without perforation or abscess with bleeding: Secondary | ICD-10-CM

## 2015-06-10 DIAGNOSIS — R7989 Other specified abnormal findings of blood chemistry: Secondary | ICD-10-CM

## 2015-06-10 DIAGNOSIS — D689 Coagulation defect, unspecified: Secondary | ICD-10-CM

## 2015-06-10 LAB — HEMOGLOBIN AND HEMATOCRIT, BLOOD
HEMATOCRIT: 25.8 % — AB (ref 39.0–52.0)
HEMOGLOBIN: 8.7 g/dL — AB (ref 13.0–17.0)

## 2015-06-10 LAB — CBC WITH DIFFERENTIAL/PLATELET
BASOS PCT: 0 %
Basophils Absolute: 0 10*3/uL (ref 0.0–0.1)
EOS ABS: 0 10*3/uL (ref 0.0–0.7)
Eosinophils Relative: 0 %
HEMATOCRIT: 22.2 % — AB (ref 39.0–52.0)
Hemoglobin: 7.7 g/dL — ABNORMAL LOW (ref 13.0–17.0)
Lymphocytes Relative: 8 %
Lymphs Abs: 0.8 10*3/uL (ref 0.7–4.0)
MCH: 29.7 pg (ref 26.0–34.0)
MCHC: 34.7 g/dL (ref 30.0–36.0)
MCV: 85.7 fL (ref 78.0–100.0)
MONO ABS: 1 10*3/uL (ref 0.1–1.0)
MONOS PCT: 10 %
Neutro Abs: 8.4 10*3/uL — ABNORMAL HIGH (ref 1.7–7.7)
Neutrophils Relative %: 82 %
Platelets: 74 10*3/uL — ABNORMAL LOW (ref 150–400)
RBC: 2.59 MIL/uL — ABNORMAL LOW (ref 4.22–5.81)
RDW: 15.8 % — AB (ref 11.5–15.5)
WBC: 10.2 10*3/uL (ref 4.0–10.5)

## 2015-06-10 LAB — BASIC METABOLIC PANEL
ANION GAP: 6 (ref 5–15)
BUN: <5 mg/dL — ABNORMAL LOW (ref 6–20)
CO2: 25 mmol/L (ref 22–32)
Calcium: 8 mg/dL — ABNORMAL LOW (ref 8.9–10.3)
Chloride: 104 mmol/L (ref 101–111)
Creatinine, Ser: 0.65 mg/dL (ref 0.61–1.24)
GLUCOSE: 114 mg/dL — AB (ref 65–99)
POTASSIUM: 3.9 mmol/L (ref 3.5–5.1)
SODIUM: 135 mmol/L (ref 135–145)

## 2015-06-10 LAB — RENAL FUNCTION PANEL
ALBUMIN: 2.2 g/dL — AB (ref 3.5–5.0)
ANION GAP: 3 — AB (ref 5–15)
BUN: <5 mg/dL — ABNORMAL LOW (ref 6–20)
CO2: 23 mmol/L (ref 22–32)
Calcium: 6.5 mg/dL — ABNORMAL LOW (ref 8.9–10.3)
Chloride: 113 mmol/L — ABNORMAL HIGH (ref 101–111)
Creatinine, Ser: 0.61 mg/dL (ref 0.61–1.24)
GFR calc non Af Amer: >60 mL/min (ref >60)
GLUCOSE: 97 mg/dL (ref 65–99)
PHOSPHORUS: 1.4 mg/dL — AB (ref 2.5–4.6)
POTASSIUM: 2.6 mmol/L — AB (ref 3.5–5.1)
Sodium: 139 mmol/L (ref 135–145)

## 2015-06-10 LAB — POCT I-STAT, CHEM 8
BUN: 3 mg/dL — ABNORMAL LOW (ref 6–20)
CALCIUM ION: 1.13 mmol/L (ref 1.13–1.30)
CREATININE: 0.6 mg/dL — AB (ref 0.61–1.24)
Chloride: 99 mmol/L — ABNORMAL LOW (ref 101–111)
GLUCOSE: 111 mg/dL — AB (ref 65–99)
HCT: 26 % — ABNORMAL LOW (ref 39.0–52.0)
HEMOGLOBIN: 8.8 g/dL — AB (ref 13.0–17.0)
Potassium: 3.9 mmol/L (ref 3.5–5.1)
Sodium: 137 mmol/L (ref 135–145)
TCO2: 24 mmol/L (ref 0–100)

## 2015-06-10 LAB — APTT: aPTT: 27 seconds (ref 24–37)

## 2015-06-10 LAB — MAGNESIUM
MAGNESIUM: 1.9 mg/dL (ref 1.7–2.4)
Magnesium: 1.3 mg/dL — ABNORMAL LOW (ref 1.7–2.4)

## 2015-06-10 LAB — PROTIME-INR
INR: 1.43 (ref 0.00–1.49)
PROTHROMBIN TIME: 17.5 s — AB (ref 11.6–15.2)

## 2015-06-10 LAB — PHOSPHORUS: PHOSPHORUS: 2.7 mg/dL (ref 2.5–4.6)

## 2015-06-10 MED ORDER — SODIUM CHLORIDE 0.9 % IV SOLN
2.0000 g | Freq: Once | INTRAVENOUS | Status: AC
  Administered 2015-06-10: 2 g via INTRAVENOUS
  Filled 2015-06-10: qty 20

## 2015-06-10 MED ORDER — PANTOPRAZOLE SODIUM 40 MG PO TBEC
40.0000 mg | DELAYED_RELEASE_TABLET | Freq: Every day | ORAL | Status: DC
  Administered 2015-06-10 – 2015-06-14 (×5): 40 mg via ORAL
  Filled 2015-06-10 (×6): qty 1

## 2015-06-10 MED ORDER — SODIUM PHOSPHATES 45 MMOLE/15ML IV SOLN
30.0000 mmol | Freq: Once | INTRAVENOUS | Status: AC
  Administered 2015-06-10: 30 mmol via INTRAVENOUS
  Filled 2015-06-10: qty 10

## 2015-06-10 MED ORDER — MAGNESIUM SULFATE 2 GM/50ML IV SOLN
2.0000 g | Freq: Once | INTRAVENOUS | Status: AC
  Administered 2015-06-10: 2 g via INTRAVENOUS
  Filled 2015-06-10: qty 50

## 2015-06-10 MED ORDER — POTASSIUM CHLORIDE 10 MEQ/50ML IV SOLN
10.0000 meq | INTRAVENOUS | Status: AC
  Administered 2015-06-10 (×6): 10 meq via INTRAVENOUS
  Filled 2015-06-10 (×6): qty 50

## 2015-06-10 NOTE — Assessment & Plan Note (Signed)
Saw neurology.  Started on sinemet.  Felt did not help.  Follow.  Keep f/u with neurology.

## 2015-06-10 NOTE — Progress Notes (Signed)
76f sheath removed closed with exoseal.  No complications site reviewed with rn christian.

## 2015-06-10 NOTE — Assessment & Plan Note (Signed)
Rate controlled.  On coumadin.  Followed by cardiology.

## 2015-06-10 NOTE — Assessment & Plan Note (Signed)
Blood pressure as outlined.  Has been doing well.  Same medication regimen.  Follow pressures.  Follow metabolic panel.

## 2015-06-10 NOTE — Assessment & Plan Note (Signed)
Followed by cardiology.  Continue risk factor modification.   

## 2015-06-10 NOTE — Progress Notes (Signed)
[place] Progress Note Patient Name: Tyler Stout DOB: [birth year] MRN: [id number]   Date of Service  06/10/2015  HPI/Events of Note  K+ = 2.6, Mg++ = 1.3, PO4--- = 1.4, Ca++ = 3.5 and Creatinine = 0.61.  eICU Interventions  Will order: 1. Replete K+, Mg++, Ca++ and PO4---. 2. Recheck BMP, Ca++, Mg++ and PO4--- at 2 PM.     Intervention Category Major Interventions: Electrolyte abnormality - evaluation and management  [name] 06/10/2015, 5:19 AM

## 2015-06-10 NOTE — Assessment & Plan Note (Signed)
Most recent hgb 12.7.  Had GI w/up previously.  Currently doing well.  Follow cbc.  On coumadin.

## 2015-06-10 NOTE — Progress Notes (Signed)
PULMONARY / CRITICAL CARE MEDICINE   Name: Tyler Stout MRN: [id number] DOB: 1925/01/19    ADMISSION DATE:  06/09/2015  CHIEF COMPLAINT:  GIB  HISTORY OF PRESENT ILLNESS:   58M with h/o CAD, Colonic diverticuli, gastric AVM clipped 2015, Afib on warfarin.  He states that for the last month he has been nauseated, fatigued, SOB with intermittent chest pain.  At about 6AM yesterday he had a loose BM that was bloody, he then had 3 more BMs at home that were progressively bloody.  His family took him to [place] and he had 10 blood BMs there.  He denies NSAID, Aspirin or Goody's powder use.  No EtOH, and no trauma. At OSH he received 1mg  PO Vit K, 2U FFP and 4U PRBC for a Hg drop of 12 to 8.7 then 7.7 in 15hrs.    SUBJECTIVE: No further bleeding per patient and RN. No abdominal pain. Patient reports only mild chest discomfort this morning that has spontaneously resolved. Denies any nausea.   REVIEW OF SYSTEMS:  No fever, chills, or sweats. Still no dyspnea or cough.  VITAL SIGNS: Temp:  [98 F (36.7 C)-98.8 F (37.1 C)] 98 F (36.7 C) (05/28 0344) Pulse Rate:  [95-117] 100 (05/28 0800) Resp:  [4-26] 21 (05/28 0800) BP: (74-170)/(33-98) 134/58 mmHg (05/28 0700) SpO2:  [89 %-100 %] 98 % (05/28 0800) Arterial Line BP: (81-191)/(39-83) 133/59 mmHg (05/28 0800) Weight:  [149 lb 11.1 oz (67.9 kg)] 149 lb 11.1 oz (67.9 kg) (05/28 0500) HEMODYNAMICS:   VENTILATOR SETTINGS:   INTAKE / OUTPUT:  Intake/Output Summary (Last 24 hours) at 06/10/15 0838 Last data filed at 06/10/15 0800  Gross per 24 hour  Intake 614.13 ml  Output   1600 ml  Net -985.87 ml    PHYSICAL EXAMINATION: General:  Awake. Laying recumbent in bed. No distress. Neuro:  CN 2-12 in tact. Follows commands. Grossly nonfocal.  HEENT:  Moist mucus membranes. No scleral injection or icterus. Cardiovascular:  Regular rate. Irregular rhythm. No edema. Lungs:  Clear to auscultation bilaterally. Normal work of breathing on  room air. Abdomen:  Soft. Nontender. Nondistended. Normal bowel sounds. Brown bowel movement witnessed. Integument:  Warm & dry. No rash.  LABS:  CBC  Recent Labs Lab 06/09/15 1223 06/09/15 1619 06/10/15 0437  WBC 10.4 9.7 10.2  HGB 7.8* 8.3* 7.7*  HCT 22.0* 23.9* 22.2*  PLT 84* 81* 74*   Coag's  Recent Labs Lab 06/09/15 0430 06/09/15 1030 06/10/15 0437  APTT 34 34 27  INR 1.95* 1.59* 1.43   BMET  Recent Labs Lab 06/09/15 0435 06/09/15 1030 06/10/15 0437  NA 136 135 139  K 3.7 3.4* 2.6*  CL 110 105 113*  CO2 20* 22 23  BUN 14 12 <5*  CREATININE 0.94 0.77 0.61  GLUCOSE 192* 114* 97   Electrolytes  Recent Labs Lab 06/09/15 0435 06/09/15 1030 06/10/15 0437  CALCIUM 7.3* 7.1* 6.5*  MG 1.4*  --  1.3*  PHOS 2.0*  --  1.4*   Sepsis Markers No results for input(s): LATICACIDVEN, PROCALCITON, O2SATVEN in the last 168 hours. ABG No results for input(s): PHART, PCO2ART, PO2ART in the last 168 hours. Liver Enzymes  Recent Labs Lab 06/08/15 0958 06/10/15 0437  AST 27  --   ALT 21  --   ALKPHOS 69  --   BILITOT 0.8  --   ALBUMIN 3.7 2.2*   Cardiac Enzymes  Recent Labs Lab 06/09/15 1030 06/09/15 1619 06/09/15 2149  TROPONINI 0.10*  0.11* 0.13*   Glucose  Recent Labs Lab 06/08/15 2142 06/09/15 0144  GLUCAP 159* 224*    Imaging No results found. EVENTS: 5/27 - Transfer from OSH for embolization  STUDIES: CT Abd/Pelvis W/ 5/26:  Multiple diverticula in ascending colon with mild pericolonic soft tisue stranding & concentric wall thickening. Extensive diverticulosis elsewhere throughout the colon. Small sliding hiatal hernia. Enlarged prostate. Bleeding Scan 5/26:  Active bleeding in region of cecum. Mesenteric Angiogram 5/27:  No bleeding or suspicious vascular lesion.  MICROBIOLOGY: MRSA PCR 5/26:  Negative  ANTIBIOTICS: None  LINES/TUBES: R Fem CVL 5/26 >> L Femoral Art Sheath 5/27 >> PIV x2  ASSESSMENT /  PLAN:  PULMONARY A: No acute issues.  P:   Continuous Pulse Ox.  CARDIOVASCULAR A:  Shock - Hemorraghic. Resolved. Elevated Troponin I - Likely demand ischemia.  H/O CAD H/O Atrial Fibrillation - Currently rate controlled. Previously on Coumadin?.  P:  Vitals per unit protocol Monitor on telemetry Holding Metoprolol Checking TTE  RENAL A:   Hypokalemia - Replacing. Hypocalcemia - Replacing. Hypophosphatemia - Replacing. H/O BPH H/O Urinary retention  P:   Foley catheter Monitoring UOP Trending electrolytes & renal function daily Flomax daily SodiumPhos 43mmol KCl 62mEq Calcium Gluconate 2mg  Repeat electrolytes in PM 1200  GASTROINTESTINAL A:   LGIB - Likely diverticular. Stopped.  P:   Advancing to Heart Health Diet as tolerated Protonix PO daily  HEMATOLOGIC A:   Anemia - Secondary to blood loss. Stable. Coagulopathy - Reportedly secondary to Coumadin. Thrombocytopenia - Stable.  P:  Trending Hgb/Hct q12hr Transfuse for Hgb <7.0 S/P FFP, Vitamin K & PRBC transfusions. SCDs Repeat Coags daily  INFECTIOUS A:   No acute issues.  P:   Monitor for fever & leukocytosis.  ENDOCRINE A:   No acute issues.  P:   Monitor blood glucose on daily labs.  NEUROLOGIC A:   H/O Parkinson's Disease - Off Sinemet.  P:   Holding Sinemet   FAMILY  - Updates: No family at bedside.   TODAY'S SUMMARY:  82 year old male with probable diverticular bleed. Mesenteric angiogram negative for bleeding source. Patient has no symptomatology that would be consistent with diverticulitis therefore holding on antibiotics at this time. Continuing to monitor for repeat bleeding closely. Advancing diet to heart healthy diet today. Trending Hgb every 12 hours. Discontinuing groin lines today. Transferring patient to SDU. TRH to assume care & PCCM will sign off as of 5/29.  Sonia Baller Ashok Cordia, M.D. [facility] Pulmonary & Critical Care Pager:  [phone] After 3pm or  if no response, call [phone] 8:38 AM 06/10/2015

## 2015-06-10 NOTE — Assessment & Plan Note (Signed)
On lipitor.  Low cholesterol diet and exercise.  Follow lipid panel and liver function tests.   

## 2015-06-10 NOTE — Assessment & Plan Note (Signed)
Discussed with the patient and his wife today.  He is eating now.  Good appetite.  No nausea or vomiting.  Has previously had GI w/up.  Did report some acid reflux.  Add zantac.  Continue protonix.  May need further GI w/up.

## 2015-06-11 DIAGNOSIS — I25810 Atherosclerosis of coronary artery bypass graft(s) without angina pectoris: Secondary | ICD-10-CM

## 2015-06-11 LAB — RENAL FUNCTION PANEL
ALBUMIN: 2.5 g/dL — AB (ref 3.5–5.0)
ANION GAP: 4 — AB (ref 5–15)
BUN: <5 mg/dL — ABNORMAL LOW (ref 6–20)
CHLORIDE: 105 mmol/L (ref 101–111)
CO2: 28 mmol/L (ref 22–32)
Calcium: 7.9 mg/dL — ABNORMAL LOW (ref 8.9–10.3)
Creatinine, Ser: 0.74 mg/dL (ref 0.61–1.24)
GFR calc Af Amer: >60 mL/min (ref >60)
GLUCOSE: 111 mg/dL — AB (ref 65–99)
PHOSPHORUS: 2.1 mg/dL — AB (ref 2.5–4.6)
POTASSIUM: 3.6 mmol/L (ref 3.5–5.1)
Sodium: 137 mmol/L (ref 135–145)

## 2015-06-11 LAB — CBC WITH DIFFERENTIAL/PLATELET
Basophils Absolute: 0 10*3/uL (ref 0.0–0.1)
Basophils Relative: 0 %
EOS ABS: 0 10*3/uL (ref 0.0–0.7)
Eosinophils Relative: 0 %
HCT: 22 % — ABNORMAL LOW (ref 39.0–52.0)
HEMOGLOBIN: 7.5 g/dL — AB (ref 13.0–17.0)
LYMPHS ABS: 1 10*3/uL (ref 0.7–4.0)
Lymphocytes Relative: 9 %
MCH: 29.6 pg (ref 26.0–34.0)
MCHC: 34.1 g/dL (ref 30.0–36.0)
MCV: 87 fL (ref 78.0–100.0)
MONO ABS: 1.3 10*3/uL — AB (ref 0.1–1.0)
MONOS PCT: 12 %
NEUTROS PCT: 78 %
Neutro Abs: 8.3 10*3/uL — ABNORMAL HIGH (ref 1.7–7.7)
Platelets: 94 10*3/uL — ABNORMAL LOW (ref 150–400)
RBC: 2.53 MIL/uL — ABNORMAL LOW (ref 4.22–5.81)
RDW: 15.5 % (ref 11.5–15.5)
WBC: 10.7 10*3/uL — ABNORMAL HIGH (ref 4.0–10.5)

## 2015-06-11 LAB — CBC
HEMATOCRIT: 29.5 % — AB (ref 39.0–52.0)
HEMOGLOBIN: 9.9 g/dL — AB (ref 13.0–17.0)
MCH: 29.2 pg (ref 26.0–34.0)
MCHC: 33.6 g/dL (ref 30.0–36.0)
MCV: 87 fL (ref 78.0–100.0)
Platelets: 107 10*3/uL — ABNORMAL LOW (ref 150–400)
RBC: 3.39 MIL/uL — ABNORMAL LOW (ref 4.22–5.81)
RDW: 15.2 % (ref 11.5–15.5)
WBC: 10.6 10*3/uL — AB (ref 4.0–10.5)

## 2015-06-11 LAB — PROTIME-INR
INR: 1.27 (ref 0.00–1.49)
Prothrombin Time: 16.1 seconds — ABNORMAL HIGH (ref 11.6–15.2)

## 2015-06-11 LAB — MAGNESIUM: MAGNESIUM: 1.8 mg/dL (ref 1.7–2.4)

## 2015-06-11 LAB — PREPARE RBC (CROSSMATCH)

## 2015-06-11 LAB — TROPONIN I: Troponin I: 0.13 ng/mL — ABNORMAL HIGH (ref <0.031)

## 2015-06-11 MED ORDER — OMEGA-3-ACID ETHYL ESTERS 1 G PO CAPS
1.0000 g | ORAL_CAPSULE | Freq: Every day | ORAL | Status: DC
  Administered 2015-06-11: 1 g via ORAL
  Filled 2015-06-11 (×4): qty 1

## 2015-06-11 MED ORDER — ADULT MULTIVITAMIN W/MINERALS CH
1.0000 | ORAL_TABLET | Freq: Every day | ORAL | Status: DC
  Administered 2015-06-11 – 2015-06-14 (×4): 1 via ORAL
  Filled 2015-06-11 (×4): qty 1

## 2015-06-11 MED ORDER — CITALOPRAM HYDROBROMIDE 20 MG PO TABS
20.0000 mg | ORAL_TABLET | Freq: Every day | ORAL | Status: DC
Start: 2015-06-11 — End: 2015-06-14
  Administered 2015-06-11 – 2015-06-14 (×3): 20 mg via ORAL
  Filled 2015-06-11 (×4): qty 1

## 2015-06-11 MED ORDER — PANTOPRAZOLE SODIUM 40 MG PO TBEC: 40.0000 mg | DELAYED_RELEASE_TABLET | Freq: Every day | ORAL | Status: DC

## 2015-06-11 MED ORDER — CARBIDOPA-LEVODOPA 25-100 MG PO TABS
1.0000 | ORAL_TABLET | Freq: Three times a day (TID) | ORAL | Status: DC
  Administered 2015-06-12: 1 via ORAL
  Filled 2015-06-11 (×7): qty 1

## 2015-06-11 MED ORDER — CALCIUM CARBONATE-VITAMIN D 500-200 MG-UNIT PO TABS
1.0000 | ORAL_TABLET | Freq: Every day | ORAL | Status: DC
  Administered 2015-06-11 – 2015-06-12 (×2): 1 via ORAL
  Filled 2015-06-11 (×5): qty 1

## 2015-06-11 MED ORDER — ATORVASTATIN CALCIUM 20 MG PO TABS
20.0000 mg | ORAL_TABLET | Freq: Every day | ORAL | Status: DC
Start: 2015-06-11 — End: 2015-06-14
  Administered 2015-06-11 – 2015-06-13 (×3): 20 mg via ORAL
  Filled 2015-06-11 (×4): qty 1

## 2015-06-11 MED ORDER — POTASSIUM CHLORIDE CRYS ER 20 MEQ PO TBCR
20.0000 meq | EXTENDED_RELEASE_TABLET | Freq: Once | ORAL | Status: AC
  Administered 2015-06-11: 20 meq via ORAL
  Filled 2015-06-11: qty 1

## 2015-06-11 MED ORDER — TAMSULOSIN HCL 0.4 MG PO CAPS: 0.4000 mg | ORAL_CAPSULE | Freq: Every day | ORAL | Status: DC

## 2015-06-11 NOTE — Progress Notes (Signed)
Tyler Stout - Stepdown/ICU TEAM  Tyler Stout  [id number] DOB: 05-10-1925 DOA: 06/09/2015 PCP: Tyler Pheasant, MD    Brief Narrative:  46M with h/o CAD, Colonic diverticuli, gastric AVM clipped 2015, and Afib on warfarin who c/o nausea, fatigue, and SOB with intermittent chest pain for one month. At 6AM 5/26 he had a loose BM that was bloody, followed by 3 more bloody BMs. His family took him to [facility] and he had 10 more bloody BMs there. He denied NSAID use. At OSH he received 1mg  PO Vit K, 2U FFP and 4U PRBC for a Hg drop of 12 to 8.7 then 7.7 over 15hrs.   Significant Events: 5/26 Admit to [facility] 5/26 Bleeding Scan - Active bleeding in region of cecum 5/27 Transfer from OSH for embolization 5/27 Mesenteric Angiogram - no bleeding or suspicious vascular lesions  Assessment & Plan:  Hemorraghic Shock Resolved  LGIB - Likely diverticular Appears to have stopped for now - follow in SDU  Acute blood loss anemia  Hgb drifting back down - keep Hgb 8.0 or > - transfuse 1U PRBC today and follow   Recent Labs Lab 06/09/15 1619 06/10/15 0437 06/10/15 1418 06/10/15 1715 06/11/15 0350  HGB 8.3* 7.7* 8.8* 8.7* 7.5*    Coagulopathy secondary to Coumadin S/p FFP and Vit K - INR normal   Elevated Troponin I - demand ischemia Peaked at 0.13 thus far - recheck   CAD s/p CABG x4 1991 asymptomatic - denies having a cardiac cath since surgery   Chronic Atrial Fibrillation on Coumadin as outpt - HR controlled at this time   HTN Not an active problem at this time  HLD  Hypokalemia  Corrected  Mild Hypophosphatemia  Follow w/ resumption of diet   BPH - Urinary retention Foley in place for now   Thrombocytopenia Likely due to consumption w/ active bleeding - improving   H/O Parkinson's Disease Resume home med tx   DVT prophylaxis: SCDs Code Status: FULL CODE Family Communication: spoke w/ granddaughter at bedside  Disposition Plan: SDU  Consultants:   PCCM IR  Antimicrobials:  none  Subjective: The pt is alert and conversant.  He denies cp, sob, n/v, or abdom pain.  No BM in last 24hrs.    Objective: Blood pressure 94/56, pulse 99, temperature 97.8 F (36.6 C), temperature source Axillary, resp. rate 15, weight 68.Stout kg (150 lb 2.Stout oz), SpO2 100 %.  Intake/Output Summary (Last 24 hours) at 06/11/15 0942 Last data filed at 06/11/15 0800  Gross per 24 hour  Intake    524 ml  Output   2240 ml  Net  -1716 ml   Filed Weights   06/09/15 0443 06/10/15 0500 06/11/15 0500  Weight: 67.7 kg (149 lb 4 oz) 67.9 kg (149 lb 11.Stout oz) 68.Stout kg (150 lb 2.Stout oz)    Examination: General: No acute respiratory distress Lungs: Clear to auscultation bilaterally without wheezes or crackles Cardiovascular: Regular rate without murmur gallop or rub  Abdomen: Nontender, nondistended, soft, bowel sounds positive, no rebound, no ascites, no appreciable mass Extremities: No significant cyanosis, clubbing, or edema bilateral lower extremities  CBC:  Recent Labs Lab 06/08/15 1615  06/09/15 0930 06/09/15 1223 06/09/15 1619 06/10/15 0437 06/10/15 1418 06/10/15 1715 06/11/15 0350  WBC 10.0  < > 12.3* 10.4 9.7 10.2  --   --  10.7*  NEUTROABS 7.6*  --   --   --   --  8.4*  --   --  8.3*  HGB 8.7*  < > 7.9* 7.8* 8.3* 7.7* 8.8* 8.7* 7.5*  HCT 26.Stout*  < > 22.4* 22.0* 23.9* 22.2* 26.0* 25.8* 22.0*  MCV 91.3  < > 83.0 83.7 84.8 85.7  --   --  87.0  PLT 187  < > 78* 84* 81* 74*  --   --  94*  < > = values in this interval not displayed. Basic Metabolic Panel:  Recent Labs Lab 06/09/15 0435 06/09/15 1030 06/10/15 0437 06/10/15 1415 06/10/15 1418 06/11/15 0350  NA 136 135 139 135 137 137  K 3.7 3.4* 2.6* 3.9 3.9 3.6  CL 110 105 113* 104 99* 105  CO2 20* 22 23 25   --  28  GLUCOSE 192* 114* 97 114* 111* 111*  BUN 14 12 <5* <5* 3* <5*  CREATININE 0.94 0.77 0.61 0.65 0.60* 0.74  CALCIUM 7.3* 7.Stout* 6.5* 8.0*  --  7.9*  MG Stout.4*  --  Stout.3* Stout.9  --   Stout.8  PHOS 2.0*  --  Stout.4* 2.7  --  2.Stout*   GFR: Estimated Creatinine Clearance: 58.7 mL/min (by C-G formula based on Cr of 0.74).  Liver Function Tests:  Recent Labs Lab 06/08/15 0958 06/10/15 0437 06/11/15 0350  AST 27  --   --   ALT 21  --   --   ALKPHOS 69  --   --   BILITOT 0.8  --   --   PROT 6.6  --   --   ALBUMIN 3.7 2.2* 2.5*   Coagulation Profile:  Recent Labs Lab 06/08/15 2159 06/09/15 0430 06/09/15 1030 06/10/15 0437 06/11/15 0350  INR 2.79 Stout.95* Stout.59* Stout.43 Stout.27    Cardiac Enzymes:  Recent Labs Lab 06/09/15 1030 06/09/15 1619 06/09/15 2149  TROPONINI 0.10* 0.11* 0.13*    HbA1C: HGB A1C MFR BLD  Date/Time Value Ref Range Status  03/15/2012 08:00 AM 6.2 4.6 - 6.5 % Final    Comment:    Glycemic Control Guidelines for People with Diabetes:Non Diabetic:  <6%Goal of Therapy: <7%Additional Action Suggested:  >8%   12/03/2011 08:38 AM 5.9 4.6 - 6.5 % Final    Comment:    Glycemic Control Guidelines for People with Diabetes:Non Diabetic:  <6%Goal of Therapy: <7%Additional Action Suggested:  >8%     CBG:  Recent Labs Lab 06/08/15 2142 06/09/15 0144  GLUCAP 159* 224*    Recent Results (from the past 240 hour(s))  MRSA PCR Screening     Status: None   Collection Time: 06/08/15 10:29 PM  Result Value Ref Range Status   MRSA by PCR NEGATIVE NEGATIVE Final    Comment:        The GeneXpert MRSA Assay (FDA approved for NASAL specimens only), is one component of a comprehensive MRSA colonization surveillance program. It is not intended to diagnose MRSA infection nor to guide or monitor treatment for MRSA infections.      Scheduled Meds: . latanoprost  Stout drop Both Eyes QHS  . pantoprazole  40 mg Oral Daily  . tamsulosin  0.4 mg Oral QPC supper     LOS: 2 days   Time spent: 35 minutes   Cherene Altes, MD [facility] Office  [phone] Pager - Text Page per Amion as per below:  On-Call/Text Page:      [email or web address]       password [email or web address]  If 7PM-7AM, please contact night-coverage [email or web address] Password [facility] 06/11/2015, 9:42 AM

## 2015-06-11 NOTE — Progress Notes (Signed)
[place] Progress Note Patient Name: Tyler Stout DOB: 1925/11/06 MRN: [id number]   Date of Service  06/11/2015  HPI/Events of Note  Dr. Ammie Dalton progress note from yesterday specifies removal of groin central venous line. No actual order for removal of central venous line.   eICU Interventions  Will order R groin central venous line removed.      Intervention Category Minor Interventions: Routine modifications to care plan (e.g. PRN medications for pain, fever)  Tyler Stout 06/11/2015, 4:03 AM

## 2015-06-12 ENCOUNTER — Inpatient Hospital Stay (HOSPITAL_COMMUNITY): Payer: Medicare HMO

## 2015-06-12 ENCOUNTER — Telehealth: Payer: Self-pay | Admitting: *Deleted

## 2015-06-12 ENCOUNTER — Encounter (HOSPITAL_COMMUNITY): Payer: Self-pay

## 2015-06-12 DIAGNOSIS — I340 Nonrheumatic mitral (valve) insufficiency: Secondary | ICD-10-CM

## 2015-06-12 DIAGNOSIS — G20 Parkinson's disease: Secondary | ICD-10-CM

## 2015-06-12 DIAGNOSIS — K922 Gastrointestinal hemorrhage, unspecified: Secondary | ICD-10-CM | POA: Diagnosis present

## 2015-06-12 DIAGNOSIS — E876 Hypokalemia: Secondary | ICD-10-CM

## 2015-06-12 DIAGNOSIS — R579 Shock, unspecified: Secondary | ICD-10-CM

## 2015-06-12 DIAGNOSIS — I248 Other forms of acute ischemic heart disease: Secondary | ICD-10-CM | POA: Diagnosis present

## 2015-06-12 DIAGNOSIS — N400 Enlarged prostate without lower urinary tract symptoms: Secondary | ICD-10-CM | POA: Diagnosis present

## 2015-06-12 DIAGNOSIS — R7989 Other specified abnormal findings of blood chemistry: Secondary | ICD-10-CM

## 2015-06-12 DIAGNOSIS — I482 Chronic atrial fibrillation, unspecified: Secondary | ICD-10-CM | POA: Diagnosis present

## 2015-06-12 DIAGNOSIS — R778 Other specified abnormalities of plasma proteins: Secondary | ICD-10-CM | POA: Diagnosis present

## 2015-06-12 DIAGNOSIS — I10 Essential (primary) hypertension: Secondary | ICD-10-CM | POA: Diagnosis present

## 2015-06-12 DIAGNOSIS — D62 Acute posthemorrhagic anemia: Secondary | ICD-10-CM | POA: Diagnosis present

## 2015-06-12 DIAGNOSIS — R578 Other shock: Secondary | ICD-10-CM | POA: Diagnosis present

## 2015-06-12 DIAGNOSIS — E785 Hyperlipidemia, unspecified: Secondary | ICD-10-CM | POA: Diagnosis present

## 2015-06-12 LAB — PREPARE FRESH FROZEN PLASMA
UNIT DIVISION: 0
Unit division: 0
Unit division: 0

## 2015-06-12 LAB — PROTIME-INR
INR: 1.26 (ref 0.00–1.49)
PROTHROMBIN TIME: 15.9 s — AB (ref 11.6–15.2)

## 2015-06-12 LAB — COMPREHENSIVE METABOLIC PANEL
ALBUMIN: 2.6 g/dL — AB (ref 3.5–5.0)
ALT: 21 U/L (ref 17–63)
AST: 29 U/L (ref 15–41)
Alkaline Phosphatase: 57 U/L (ref 38–126)
Anion gap: 3 — ABNORMAL LOW (ref 5–15)
BUN: 9 mg/dL (ref 6–20)
CHLORIDE: 103 mmol/L (ref 101–111)
CO2: 29 mmol/L (ref 22–32)
Calcium: 8.1 mg/dL — ABNORMAL LOW (ref 8.9–10.3)
Creatinine, Ser: 0.82 mg/dL (ref 0.61–1.24)
GFR calc Af Amer: >60 mL/min (ref >60)
GLUCOSE: 109 mg/dL — AB (ref 65–99)
POTASSIUM: 4.4 mmol/L (ref 3.5–5.1)
SODIUM: 135 mmol/L (ref 135–145)
Total Bilirubin: 0.6 mg/dL (ref 0.3–1.2)
Total Protein: 4.8 g/dL — ABNORMAL LOW (ref 6.5–8.1)

## 2015-06-12 LAB — TYPE AND SCREEN
ABO/RH(D): O POS
Antibody Screen: NEGATIVE
UNIT DIVISION: 0

## 2015-06-12 LAB — CBC
HCT: 27.6 % — ABNORMAL LOW (ref 39.0–52.0)
Hemoglobin: 9.2 g/dL — ABNORMAL LOW (ref 13.0–17.0)
MCH: 29.3 pg (ref 26.0–34.0)
MCHC: 33.3 g/dL (ref 30.0–36.0)
MCV: 87.9 fL (ref 78.0–100.0)
PLATELETS: 108 10*3/uL — AB (ref 150–400)
RBC: 3.14 MIL/uL — ABNORMAL LOW (ref 4.22–5.81)
RDW: 15.3 % (ref 11.5–15.5)
WBC: 9.5 10*3/uL (ref 4.0–10.5)

## 2015-06-12 LAB — ECHOCARDIOGRAM COMPLETE
HEIGHTINCHES: 66 in
Weight: 2289.26 oz

## 2015-06-12 LAB — TROPONIN I: TROPONIN I: 0.12 ng/mL — AB (ref <0.031)

## 2015-06-12 MED ORDER — ENSURE ENLIVE PO LIQD: 237.0000 mL | Freq: Two times a day (BID) | ORAL | Status: DC

## 2015-06-12 MED ORDER — METOPROLOL TARTRATE 12.5 MG HALF TABLET
12.5000 mg | ORAL_TABLET | Freq: Two times a day (BID) | ORAL | Status: DC
  Administered 2015-06-12 – 2015-06-13 (×3): 12.5 mg via ORAL
  Filled 2015-06-12 (×4): qty 1

## 2015-06-12 NOTE — Progress Notes (Signed)
  Echocardiogram 2D Echocardiogram has been performed.  Tyler Stout 06/12/2015, 10:22 AM

## 2015-06-12 NOTE — Care Management Important Message (Signed)
Important Message  Patient Details  Name: Tyler Stout MRN: [id number] Date of Birth: 22-Jun-1925   Medicare Important Message Given:  Yes    Sariah Henkin, Leroy Sea 06/12/2015, 10:40 AM

## 2015-06-12 NOTE — Telephone Encounter (Signed)
Patients daughter called in regards to pt.being admitted on Friday to [facility] ICU for an intestinal bleed. Patients daughter requested that Dr. Nicki Reaper read the hospital notes, and give her a call with any questions. She wanted PCP to be aware of the pt. admitting.

## 2015-06-12 NOTE — Progress Notes (Signed)
Patient trasfered from 55M to [phone] via wheelchair; alert and oriented x 4; no complaints of pain; IV saline locked in LFA and RFA. Orient patient to room and unit; instructed how to use the call bell and  fall risk precautions. Will continue to monitor the patient.

## 2015-06-12 NOTE — Progress Notes (Signed)
PROGRESS NOTE    Tyler Stout  [id number] DOB: July 13, 1925 DOA: 06/09/2015 PCP: [name], MD   Brief Narrative:  Brief Narrative:  11M with h/o CAD, Colonic diverticuli, gastric AVM clipped 2015, and Afib on warfarin who c/o nausea, fatigue, and SOB with intermittent chest pain for one month. At 6AM 5/26 he had a loose BM that was bloody, followed by 3 more bloody BMs. His family took him to [facility] and he had 10 more bloody BMs there. He denied NSAID use. At OSH he received 1mg  PO Vit K, 2U FFP and 4U PRBC for a Hg drop of 12 to 8.7 then 7.7 over 15hrs.    Assessment & Plan:   Active Problems:   GIB (gastrointestinal bleeding)   Hemorrhagic shock   Lower GI bleed   Acute blood loss anemia   Elevated troponin   Demand ischemia (HCC)   Chronic atrial fibrillation (HCC)   Essential hypertension   HLD (hyperlipidemia)   BPH (benign prostatic hyperplasia)   Parkinson's disease ([place])  Hemorraghic Shock -Resolved  LGIB - Likely diverticular -Appears to have stopped for now - follow in SDU  Acute blood loss anemia  -Hgb drifting back down - Transfuse for hemoglobin <8  -5/29 transfuse 1U PRBC  Recent Labs Lab 06/10/15 1418 06/10/15 1715 06/11/15 0350 06/11/15 1602 06/12/15 0350  HGB 8.8* 8.7* 7.5* 9.9* 9.2*  -stable  Coagulopathy secondary to Coumadin -On admission patient's INR 2.79, S/p FFP and Vit K - INR normal   Elevated Troponin I - demand ischemia -Peaked at 0.13. -Trending down, continue to follow   CAD s/p CABG x4 1991 -asymptomatic - denies having a cardiac cath since surgery   Chronic Atrial Fibrillation -on Coumadin as outpt -Restart metoprolol 12.5 mg BID (home dose 25 mg BID). Don't believe patient's BP will tolerate, titrate up slowly -Hold diltiazem 240 mg daily -Discussed at length risks and benefits of restarting anticoagulation at this time with patient and his wife. We will hold on restarting anticoagulation until patient  sees his cardiologist on 6/17  HTN -See chronic atrial fibrillation Not an active problem at this time  HLD -Lipid panel within ADA guidelines  Hypokalemia  Corrected  Mild Hypophosphatemia  Follow w/ resumption of diet   BPH - Urinary retention -Foley in place for now   Thrombocytopenia -Likely due to consumption w/ active bleeding  - improving   H/O Parkinson's Disease -Sinemet 25-100  1 tab TID    DVT prophylaxis: SCD Code Status: Full Family Communication: Wife present for discussion Disposition Plan: Discharge in 48 hours after titrating medication for A. fib   Consultants:  DrJennings E Nestor. PC CM   Procedures/Significant Events:  5/26 Admit to [facility] 5/26 Bleeding Scan - Active bleeding in region of cecum 5/27 Transfer from OSH for embolization 5/27 Mesenteric Angiogram - no bleeding or suspicious vascular lesions 5/29 transfuse 1U PRBC   Cultures NA  Antimicrobials: NA   Devices NA   LINES / TUBES:  NA    Continuous Infusions:    Subjective: 5/30 A/O 4, NAD, sitting in chair comfortably   Objective: Filed Vitals:   06/12/15 1619 06/12/15 1700 06/12/15 1800 06/12/15 1849  BP:  122/55 147/64 149/63  Pulse:  94 97 94  Temp: 97.5 F (36.4 C)   98.3 F (36.8 C)  TempSrc: Oral   Oral  Resp:  17 20   Height:      Weight:      SpO2:  100% 100% 100%  Intake/Output Summary (Last 24 hours) at 06/12/15 2042 Last data filed at 06/12/15 1800  Gross per 24 hour  Intake    240 ml  Output   3425 ml  Net  -3185 ml   Filed Weights   06/10/15 0500 06/11/15 0500 06/12/15 0400  Weight: 67.9 kg (149 lb 11.1 oz) 68.1 kg (150 lb 2.1 oz) 64.9 kg (143 lb 1.3 oz)    Examination:  General: A/O 4, NAD, No acute respiratory distress Eyes: negative scleral hemorrhage, negative anisocoria, negative icterus ENT: Negative Runny nose, negative gingival bleeding, Neck:  Negative scars, masses, torticollis, lymphadenopathy, JVD Lungs: Clear  to auscultation bilaterally without wheezes or crackles Cardiovascular: Irregular irregular rhythm and rate, without murmur gallop or rub normal S1 and S2 Abdomen: negative abdominal pain, nondistended, positive soft, bowel sounds, no rebound, no ascites, no appreciable mass Extremities: No significant cyanosis, clubbing, or edema bilateral lower extremities Skin: Negative rashes, lesions, ulcers Psychiatric:  Negative depression, negative anxiety, negative fatigue, negative mania  Central nervous system:  Cranial nerves II through XII intact, tongue/uvula midline, all extremities muscle strength 5/5, sensation intact throughout, finger nose finger bilateral within normal limits, quick finger touch bilateral within normal limits, negative dysarthria, negative expressive aphasia, negative receptive aphasia.  .     Data Reviewed: Care during the described time interval was provided by me .  I have reviewed this patient's available data, including medical history, events of note, physical examination, and all test results as part of my evaluation. I have personally reviewed and interpreted all radiology studies.  CBC:  Recent Labs Lab 06/08/15 1615  06/09/15 1619 06/10/15 0437 06/10/15 1418 06/10/15 1715 06/11/15 0350 06/11/15 1602 06/12/15 0350  WBC 10.0  < > 9.7 10.2  --   --  10.7* 10.6* 9.5  NEUTROABS 7.6*  --   --  8.4*  --   --  8.3*  --   --   HGB 8.7*  < > 8.3* 7.7* 8.8* 8.7* 7.5* 9.9* 9.2*  HCT 26.1*  < > 23.9* 22.2* 26.0* 25.8* 22.0* 29.5* 27.6*  MCV 91.3  < > 84.8 85.7  --   --  87.0 87.0 87.9  PLT 187  < > 81* 74*  --   --  94* 107* 108*  < > = values in this interval not displayed. Basic Metabolic Panel:  Recent Labs Lab 06/09/15 0435 06/09/15 1030 06/10/15 0437 06/10/15 1415 06/10/15 1418 06/11/15 0350 06/12/15 0350  NA 136 135 139 135 137 137 135  K 3.7 3.4* 2.6* 3.9 3.9 3.6 4.4  CL 110 105 113* 104 99* 105 103  CO2 20* 22 23 25   --  28 29  GLUCOSE 192*  114* 97 114* 111* 111* 109*  BUN 14 12 <5* <5* 3* <5* 9  CREATININE 0.94 0.77 0.61 0.65 0.60* 0.74 0.82  CALCIUM 7.3* 7.1* 6.5* 8.0*  --  7.9* 8.1*  MG 1.4*  --  1.3* 1.9  --  1.8  --   PHOS 2.0*  --  1.4* 2.7  --  2.1*  --    GFR: Estimated Creatinine Clearance: 57.3 mL/min (by C-G formula based on Cr of 0.82). Liver Function Tests:  Recent Labs Lab 06/08/15 0958 06/10/15 0437 06/11/15 0350 06/12/15 0350  AST 27  --   --  29  ALT 21  --   --  21  ALKPHOS 69  --   --  57  BILITOT 0.8  --   --  0.6  PROT 6.6  --   --  4.8*  ALBUMIN 3.7 2.2* 2.5* 2.6*   No results for input(s): LIPASE, AMYLASE in the last 168 hours. No results for input(s): AMMONIA in the last 168 hours. Coagulation Profile:  Recent Labs Lab 06/09/15 0430 06/09/15 1030 06/10/15 0437 06/11/15 0350 06/12/15 0350  INR 1.95* 1.59* 1.43 1.27 1.26   Cardiac Enzymes:  Recent Labs Lab 06/09/15 1030 06/09/15 1619 06/09/15 2149 06/11/15 1602 06/12/15 0350  TROPONINI 0.10* 0.11* 0.13* 0.13* 0.12*   BNP (last 3 results) No results for input(s): PROBNP in the last 8760 hours. HbA1C: No results for input(s): HGBA1C in the last 72 hours. CBG:  Recent Labs Lab 06/08/15 2142 06/09/15 0144  GLUCAP 159* 224*   Lipid Profile: No results for input(s): CHOL, HDL, LDLCALC, TRIG, CHOLHDL, LDLDIRECT in the last 72 hours. Thyroid Function Tests: No results for input(s): TSH, T4TOTAL, FREET4, T3FREE, THYROIDAB in the last 72 hours. Anemia Panel: No results for input(s): VITAMINB12, FOLATE, FERRITIN, TIBC, IRON, RETICCTPCT in the last 72 hours. Urine analysis:    Component Value Date/Time   COLORURINE YELLOW* 03/06/2015 1717   COLORURINE Yellow 03/01/2012 0720   APPEARANCEUR CLEAR* 03/06/2015 1717   APPEARANCEUR Hazy 03/01/2012 0720   LABSPEC 1.012 03/06/2015 1717   LABSPEC 1.016 03/01/2012 0720   PHURINE 5.0 03/06/2015 1717   PHURINE 7.0 03/01/2012 0720   GLUCOSEU NEGATIVE 03/06/2015 1717   GLUCOSEU  Negative 03/01/2012 0720   HGBUR 2+* 03/06/2015 1717   HGBUR Negative 03/01/2012 0720   BILIRUBINUR NEGATIVE 03/06/2015 1717   BILIRUBINUR Negative 03/01/2012 0720   KETONESUR NEGATIVE 03/06/2015 1717   KETONESUR Negative 03/01/2012 0720   PROTEINUR NEGATIVE 03/06/2015 1717   PROTEINUR Negative 03/01/2012 0720   NITRITE NEGATIVE 03/06/2015 1717   NITRITE Negative 03/01/2012 0720   LEUKOCYTESUR NEGATIVE 03/06/2015 1717   LEUKOCYTESUR Negative 03/01/2012 0720   Sepsis Labs: @LABRCNTIP (procalcitonin:4,lacticidven:4)  ) Recent Results (from the past 240 hour(s))  MRSA PCR Screening     Status: None   Collection Time: 06/08/15 10:29 PM  Result Value Ref Range Status   MRSA by PCR NEGATIVE NEGATIVE Final    Comment:        The GeneXpert MRSA Assay (FDA approved for NASAL specimens only), is one component of a comprehensive MRSA colonization surveillance program. It is not intended to diagnose MRSA infection nor to guide or monitor treatment for MRSA infections.          Radiology Studies: No results found.      Scheduled Meds: . atorvastatin  20 mg Oral QHS  . calcium-vitamin D  1 tablet Oral Daily  . carbidopa-levodopa  1 tablet Oral TID WC  . citalopram  20 mg Oral Daily  . feeding supplement (ENSURE ENLIVE)  237 mL Oral BID BM  . latanoprost  1 drop Both Eyes QHS  . metoprolol tartrate  12.5 mg Oral BID  . multivitamin with minerals  1 tablet Oral Daily  . omega-3 acid ethyl esters  1 g Oral Daily  . pantoprazole  40 mg Oral Daily  . tamsulosin  0.4 mg Oral QPC supper   Continuous Infusions:    LOS: 3 days    Time spent: 40 minutes    Warda Mcqueary, Geraldo Docker, MD [facility] Pager [phone]   If 7PM-7AM, please contact night-coverage [email or web address] Password [facility] 06/12/2015, 8:42 PM

## 2015-06-13 LAB — PROTIME-INR
INR: 1.13 (ref 0.00–1.49)
PROTHROMBIN TIME: 14.7 s (ref 11.6–15.2)

## 2015-06-13 LAB — CBC
HEMATOCRIT: 30 % — AB (ref 39.0–52.0)
HEMOGLOBIN: 9.8 g/dL — AB (ref 13.0–17.0)
MCH: 28.8 pg (ref 26.0–34.0)
MCHC: 32.7 g/dL (ref 30.0–36.0)
MCV: 88.2 fL (ref 78.0–100.0)
PLATELETS: 183 10*3/uL (ref 150–400)
RBC: 3.4 MIL/uL — AB (ref 4.22–5.81)
RDW: 15.2 % (ref 11.5–15.5)
WBC: 8.1 10*3/uL (ref 4.0–10.5)

## 2015-06-13 MED ORDER — BETHANECHOL CHLORIDE 10 MG PO TABS: 10.0000 mg | ORAL_TABLET | Freq: Three times a day (TID) | ORAL | Status: DC

## 2015-06-13 MED ORDER — BETHANECHOL CHLORIDE 10 MG PO TABS
10.0000 mg | ORAL_TABLET | Freq: Three times a day (TID) | ORAL | Status: DC
  Administered 2015-06-13 – 2015-06-14 (×3): 10 mg via ORAL
  Filled 2015-06-13 (×5): qty 1

## 2015-06-13 NOTE — Progress Notes (Signed)
[place] TEAM 1 - Stepdown/ICU TEAM  DAULTON BRITTS  [id number] DOB: 10-13-1925 DOA: 06/09/2015 PCP: [name], MD    Brief Narrative:  46M with h/o CAD, Colonic diverticuli, gastric AVM clipped 2015, and Afib on warfarin who c/o nausea, fatigue, and SOB with intermittent chest pain for one month. At 6AM 5/26 he had a loose BM that was bloody, followed by 3 more bloody BMs. His family took him to [facility] and he had 10 more bloody BMs there. He denied NSAID use. At OSH he received 1mg  PO Vit K, 2U FFP and 4U PRBC for a Hg drop of 12 to 8.7 then 7.7 over 15hrs.   Significant Events: 5/26 Admit to [facility] 5/26 Bleeding Scan - Active bleeding in region of cecum 5/27 Transfer from OSH for embolization 5/27 Mesenteric Angiogram - no bleeding or suspicious vascular lesions  Assessment & Plan:  Hemorraghic Shock Resolved  LGIB - Likely diverticular Appears to have stopped for now - pt denies any further BRBPR   Acute blood loss anemia  Hgb may be slowly drifting downward, or may simply be re-equilibrating - check CBC this afternoon and again in AM - no evident blood loss    Recent Labs Lab 06/10/15 1418 06/10/15 1715 06/11/15 0350 06/11/15 1602 06/12/15 0350  HGB 8.8* 8.7* 7.5* 9.9* 9.2*    Coagulopathy secondary to Coumadin S/p FFP and Vit K - INR normal - cont to hold coumadin for now   Elevated Troponin I - demand ischemia Peaked at 0.13 - no chest pain   CAD s/p CABG x4 1991 asymptomatic - denies having a cardiac cath since surgery   Chronic Atrial Fibrillation on Coumadin as outpt - HR controlled at this time but BP marginal - follow - holding anticoag for now in setting of GIB   HTN Not an active problem at this time  HLD Lipitor resumed   Hypokalemia  Corrected  Mild Hypophosphatemia  resolved w/ resumption of diet   BPH - Urinary retention flomax resumed 48hrs prior to foley d/c (5/31) - only minimal output so far - give 6 doses of  urecholine to assist - follow UOP   Thrombocytopenia Likely due to consumption w/ active bleeding - stable   H/O Parkinson's Disease Resumed home med tx   DVT prophylaxis: SCDs Code Status: FULL CODE Family Communication: No family present at time of exam today Disposition Plan: f/u CBC - assure UOP adequate - possible d/c home in AM   Consultants:  PCCM IR  Antimicrobials:  none  Subjective: The patient is sitting up in a bedside chair.  He denies any further bright red blood per rectum.  He denies chest pain shortness breath fevers chills nausea or vomiting.  He is in good spirits.  Objective: Blood pressure 105/54, pulse 80, temperature 97.8 F (36.6 C), temperature source Oral, resp. rate 18, height 5\' 6"  (1.676 m), weight 64.9 kg (143 lb 1.3 oz), SpO2 98 %.  Intake/Output Summary (Last 24 hours) at 06/13/15 1211 Last data filed at 06/13/15 1025  Gross per 24 hour  Intake    480 ml  Output   1650 ml  Net  -1170 ml   Filed Weights   06/10/15 0500 06/11/15 0500 06/12/15 0400  Weight: 67.9 kg (149 lb 11.1 oz) 68.1 kg (150 lb 2.1 oz) 64.9 kg (143 lb 1.3 oz)    Examination: General: No acute respiratory distress Lungs: Clear to auscultation bilaterally - no wheezes or crackles Cardiovascular: Regular rate without murmur  Abdomen: Nontender, nondistended, soft, bowel sounds positive, no rebound, no ascites Extremities: No significant cyanosis, clubbing, edema bilateral lower extremities  CBC:  Recent Labs Lab 06/08/15 1615  06/09/15 1619 06/10/15 0437 06/10/15 1418 06/10/15 1715 06/11/15 0350 06/11/15 1602 06/12/15 0350  WBC 10.0  < > 9.7 10.2  --   --  10.7* 10.6* 9.5  NEUTROABS 7.6*  --   --  8.4*  --   --  8.3*  --   --   HGB 8.7*  < > 8.3* 7.7* 8.8* 8.7* 7.5* 9.9* 9.2*  HCT 26.1*  < > 23.9* 22.2* 26.0* 25.8* 22.0* 29.5* 27.6*  MCV 91.3  < > 84.8 85.7  --   --  87.0 87.0 87.9  PLT 187  < > 81* 74*  --   --  94* 107* 108*  < > = values in this interval  not displayed. Basic Metabolic Panel:  Recent Labs Lab 06/09/15 0435 06/09/15 1030 06/10/15 0437 06/10/15 1415 06/10/15 1418 06/11/15 0350 06/12/15 0350  NA 136 135 139 135 137 137 135  K 3.7 3.4* 2.6* 3.9 3.9 3.6 4.4  CL 110 105 113* 104 99* 105 103  CO2 20* 22 23 25   --  28 29  GLUCOSE 192* 114* 97 114* 111* 111* 109*  BUN 14 12 <5* <5* 3* <5* 9  CREATININE 0.94 0.77 0.61 0.65 0.60* 0.74 0.82  CALCIUM 7.3* 7.1* 6.5* 8.0*  --  7.9* 8.1*  MG 1.4*  --  1.3* 1.9  --  1.8  --   PHOS 2.0*  --  1.4* 2.7  --  2.1*  --    GFR: Estimated Creatinine Clearance: 57.3 mL/min (by C-G formula based on Cr of 0.82).  Liver Function Tests:  Recent Labs Lab 06/08/15 0958 06/10/15 0437 06/11/15 0350 06/12/15 0350  AST 27  --   --  29  ALT 21  --   --  21  ALKPHOS 69  --   --  57  BILITOT 0.8  --   --  0.6  PROT 6.6  --   --  4.8*  ALBUMIN 3.7 2.2* 2.5* 2.6*   Coagulation Profile:  Recent Labs Lab 06/09/15 1030 06/10/15 0437 06/11/15 0350 06/12/15 0350 06/13/15 0556  INR 1.59* 1.43 1.27 1.26 1.13    Cardiac Enzymes:  Recent Labs Lab 06/09/15 1030 06/09/15 1619 06/09/15 2149 06/11/15 1602 06/12/15 0350  TROPONINI 0.10* 0.11* 0.13* 0.13* 0.12*    HbA1C: HGB A1C MFR BLD  Date/Time Value Ref Range Status  03/15/2012 08:00 AM 6.2 4.6 - 6.5 % Final    Comment:    Glycemic Control Guidelines for People with Diabetes:Non Diabetic:  <6%Goal of Therapy: <7%Additional Action Suggested:  >8%   12/03/2011 08:38 AM 5.9 4.6 - 6.5 % Final    Comment:    Glycemic Control Guidelines for People with Diabetes:Non Diabetic:  <6%Goal of Therapy: <7%Additional Action Suggested:  >8%     CBG:  Recent Labs Lab 06/08/15 2142 06/09/15 0144  GLUCAP 159* 224*    Recent Results (from the past 240 hour(s))  MRSA PCR Screening     Status: None   Collection Time: 06/08/15 10:29 PM  Result Value Ref Range Status   MRSA by PCR NEGATIVE NEGATIVE Final    Comment:        The  GeneXpert MRSA Assay (FDA approved for NASAL specimens only), is one component of a comprehensive MRSA colonization surveillance program. It is not intended to diagnose MRSA  infection nor to guide or monitor treatment for MRSA infections.      Scheduled Meds: . atorvastatin  20 mg Oral QHS  . bethanechol  10 mg Oral TID  . calcium-vitamin D  1 tablet Oral Daily  . carbidopa-levodopa  1 tablet Oral TID WC  . citalopram  20 mg Oral Daily  . feeding supplement (ENSURE ENLIVE)  237 mL Oral BID BM  . latanoprost  1 drop Both Eyes QHS  . metoprolol tartrate  12.5 mg Oral BID  . multivitamin with minerals  1 tablet Oral Daily  . omega-3 acid ethyl esters  1 g Oral Daily  . pantoprazole  40 mg Oral Daily  . tamsulosin  0.4 mg Oral QPC supper     LOS: 4 days   Time spent: 35 minutes   Cherene Altes, MD [facility] Office  [phone] Pager - Text Page per Amion as per below:  On-Call/Text Page:      [email or web address]      password [email or web address]  If 7PM-7AM, please contact night-coverage [email or web address] Password [email or web address] 06/13/2015, 12:11 PM

## 2015-06-13 NOTE — Progress Notes (Signed)
Patient has a urethral catheter for acute urinary retention. Patient stated he ran out of his flomax RX at home and has now been taking it here. MD paged make aware and order to d/c catheter.

## 2015-06-13 NOTE — Progress Notes (Signed)
Nutrition Brief Note  Patient identified on the Malnutrition Screening Tool (MST) Report  Wt Readings from Last 15 Encounters:  06/12/15 143 lb 1.3 oz (64.9 kg)  06/08/15 140 lb (63.504 kg)  05/28/15 145 lb 4 oz (65.885 kg)  03/15/15 156 lb 8 oz (70.988 kg)  03/06/15 154 lb 3.2 oz (69.945 kg)  11/08/14 152 lb (68.947 kg)  08/08/14 155 lb 2 oz (70.364 kg)  05/02/14 153 lb 6 oz (69.57 kg)  03/14/14 158 lb 8 oz (71.895 kg)  12/28/13 153 lb 12 oz (69.741 kg)  11/09/13 151 lb 8 oz (68.72 kg)  08/09/13 156 lb 8 oz (70.988 kg)  05/09/13 152 lb 8 oz (69.174 kg)  03/11/13 158 lb 8 oz (71.895 kg)  02/15/13 153 lb (69.4 kg)   61M with h/o CAD, Colonic diverticuli, gastric AVM clipped 2015, Afib on warfarin. He states that for the last month he has been nauseated, fatigued, SOB with intermittent chest pain. At about 6AM yesterday he had a loose BM that was bloody, he then had 3 more BMs at home that were progressively bloody. His family took him to [place] and he had 10 blood BMs there. He denies NSAID, Aspirin or Goody's powder use. No EtOH, and no trauma.  Pt admitted with GIB.   Pt reports he's feeling great and always has a good appetite. He reports that he consumes 4-6 small meals per day. He reports that he's been consuming almost all of his meal trays.   He denies any weight loss. Per wt hx, UBW around 152#. Pt has experienced some mild wt loss, but no weight changes significant for time frame.   Body mass index is 23.1 kg/(m^2). Patient meets criteria for normal weight range based on current BMI.   Current diet order is Heart Healthy, patient is consuming approximately 25-100% of meals at this time. Labs and medications reviewed.   No nutrition interventions warranted at this time. If nutrition issues arise, please consult RD.   Emilina Smarr A. Jimmye Norman, RD, LDN, CDE Pager: [phone] After hours Pager: [phone]

## 2015-06-13 NOTE — Evaluation (Signed)
Physical Therapy Evaluation Patient Details Name: Tyler Stout MRN: [id number] DOB: 1925/02/14 Today's Date: 06/13/2015   History of Present Illness  82 y.o. male with h/o Parkinsons, CAD, GERD, A fib, glaucoma, CABG admitted with lower GIB.   Clinical Impression  Pt admitted with above diagnosis. Pt currently with functional limitations due to the deficits listed below (see PT Problem List). Pt ambulated 240' with RW and min/guard assist for safety/balance. He would benefit from HHPT and RW.  Pt will benefit from skilled PT to increase their independence and safety with mobility to allow discharge to the venue listed below.       Follow Up Recommendations Home health PT;Supervision for mobility/OOB    Equipment Recommendations  Rolling walker with 5" wheels    Recommendations for Other Services       Precautions / Restrictions Precautions Precautions: Fall Precaution Comments: 2-3 falls in past year Restrictions Weight Bearing Restrictions: No      Mobility  Bed Mobility               General bed mobility comments: up in recliner  Transfers Overall transfer level: Needs assistance Equipment used: Rolling walker (2 wheeled) Transfers: Sit to/from Stand Sit to Stand: Min assist         General transfer comment: VCs hand placement, min A to control descent for stand to sit, min A to rise  Ambulation/Gait Ambulation/Gait assistance: Min guard Ambulation Distance (Feet): 240 Feet Assistive device: Rolling walker (2 wheeled) Gait Pattern/deviations: Decreased step length - left;Decreased stance time - right;Trunk flexed   Gait velocity interpretation: at or above normal speed for age/gender General Gait Details: min/guard for safety, no LOB  Stairs            Wheelchair Mobility    Modified Rankin (Stroke Patients Only)       Balance Overall balance assessment: History of Falls;Needs assistance   Sitting balance-Leahy Scale: Good        Standing balance-Leahy Scale: Fair                               Pertinent Vitals/Pain Pain Assessment: No/denies pain    Home Living Family/patient expects to be discharged to:: Private residence Living Arrangements: Spouse/significant other Available Help at Discharge: Family;Available 24 hours/day   Home Access: Stairs to enter   Entrance Stairs-Number of Steps: 3 Home Layout: One level Home Equipment: Cane - single point;Shower seat      Prior Function Level of Independence: Independent with assistive device(s)         Comments: uses cane PRN     Hand Dominance        Extremity/Trunk Assessment   Upper Extremity Assessment: Overall WFL for tasks assessed           Lower Extremity Assessment: Overall WFL for tasks assessed      Cervical / Trunk Assessment: Kyphotic  Communication   Communication: No difficulties  Cognition Arousal/Alertness: Awake/alert Behavior During Therapy: WFL for tasks assessed/performed Overall Cognitive Status: Within Functional Limits for tasks assessed                      General Comments      Exercises        Assessment/Plan    PT Assessment    PT Diagnosis Generalized weakness   PT Problem List    PT Treatment Interventions     PT Goals (Current  goals can be found in the Care Plan section) Acute Rehab PT Goals Patient Stated Goal: return to independence PT Goal Formulation: With patient Time For Goal Achievement: 06/27/15 Potential to Achieve Goals: Good    Frequency     Barriers to discharge        Co-evaluation               End of Session Equipment Utilized During Treatment: Gait belt Activity Tolerance: Patient tolerated treatment well;No increased pain Patient left: in chair;with call bell/phone within reach;with chair alarm set           Time: 1129-1150 PT Time Calculation (min) (ACUTE ONLY): 21 min   Charges:   PT Evaluation $PT Eval Low Complexity: 1  Procedure     PT G CodesPhilomena Doheny 06/13/2015, 12:07 PM [phone]

## 2015-06-14 LAB — BASIC METABOLIC PANEL
Anion gap: 6 (ref 5–15)
BUN: 12 mg/dL (ref 6–20)
CHLORIDE: 102 mmol/L (ref 101–111)
CO2: 26 mmol/L (ref 22–32)
Calcium: 8.3 mg/dL — ABNORMAL LOW (ref 8.9–10.3)
Creatinine, Ser: 0.78 mg/dL (ref 0.61–1.24)
Glucose, Bld: 95 mg/dL (ref 65–99)
POTASSIUM: 4 mmol/L (ref 3.5–5.1)
SODIUM: 134 mmol/L — AB (ref 135–145)

## 2015-06-14 LAB — PROTIME-INR
INR: 1.2 (ref 0.00–1.49)
PROTHROMBIN TIME: 15.4 s — AB (ref 11.6–15.2)

## 2015-06-14 LAB — CBC
HCT: 29.5 % — ABNORMAL LOW (ref 39.0–52.0)
HEMOGLOBIN: 9.8 g/dL — AB (ref 13.0–17.0)
MCH: 29 pg (ref 26.0–34.0)
MCHC: 33.2 g/dL (ref 30.0–36.0)
MCV: 87.3 fL (ref 78.0–100.0)
PLATELETS: 192 10*3/uL (ref 150–400)
RBC: 3.38 MIL/uL — AB (ref 4.22–5.81)
RDW: 15.3 % (ref 11.5–15.5)
WBC: 7.3 10*3/uL (ref 4.0–10.5)

## 2015-06-14 MED ORDER — METOPROLOL TARTRATE 25 MG PO TABS
25.0000 mg | ORAL_TABLET | Freq: Two times a day (BID) | ORAL | Status: DC
  Administered 2015-06-14: 25 mg via ORAL
  Filled 2015-06-14: qty 1

## 2015-06-14 NOTE — Progress Notes (Signed)
Pt given discharge instructions, prescriptions, and care notes. Pt verbalized understanding AEB no further questions or concerns at this time. IV was discontinued, no redness, pain, or swelling noted at this time. Telemetry discontinued and Centralized Telemetry was notified. Pt left the floor via wheelchair with staff in stable condition. 

## 2015-06-14 NOTE — Care Management Note (Signed)
Case Management Note  Patient Details  Name: ARTIS SOUTHAM MRN: [id number] Date of Birth: 06/01/1925  Subjective/Objective:                    Action/Plan: Discharge to home today with Home health services (PT).  Expected Discharge Date:   06/14/2015            Expected Discharge Plan:  [place]  In-House Referral:     Discharge planning Services  CM Consult  Post Acute Care Choice:    Choice offered to:  Patient  DME Arranged:  Gilford Rile rolling/Jermaine [id number] DME Agency:  [place]:  PT [place]:  [place]  Status of Service:  Completed, signed off   Additional Comments:  Flora Lipps [id number] 06/14/2015, 4:00 PM

## 2015-06-14 NOTE — Discharge Summary (Signed)
Physician Discharge Summary  Tyler Stout [id number] DOB: Aug 24, 1925 DOA: 06/09/2015  PCP: [name], MD  Admit date: 06/09/2015 Discharge date: 06/14/2015  Time spent: 40 minutes  Recommendations for Outpatient Follow-up: Hemorraghic Shock -Resolved  LGIB - Likely diverticular -Appears to have stopped for now - follow in SDU  Acute blood loss anemia  -Hgb drifting back down - Transfuse for hemoglobin <8  -5/29 transfuse 1U PRBC  Recent Labs Lab 06/11/15 0350 06/11/15 1602 06/12/15 0350 06/13/15 1344 06/14/15 0536  HGB 7.5* 9.9* 9.2* 9.8* 9.8*  -At discharge continuing to improve  Coagulopathy secondary to Coumadin -On admission patient's INR 2.79, S/p FFP and Vit K - INR normal   Elevated Troponin I - demand ischemia -Peaked at 0.13. -Trending down, continue to follow   CAD s/p CABG x4 1991 -asymptomatic - denies having a cardiac cath since surgery   Chronic Atrial Fibrillation -on Coumadin as outpt -Increase Metoprolol 25 mg BID (home dose).  -Hold diltiazem 240 mg daily -Discussed at length risks and benefits of restarting anticoagulation at this time with patient and his wife. We will hold on restarting anticoagulation until patient sees his cardiologist on 6/17. Recommend that he discuss risks and benefits of restarting Coumadin for A. Fib vs recent GI bleed.  HTN -See chronic atrial fibrillation Not an active problem at this time  HLD -Lipid panel within ADA guidelines  Hypokalemia  Corrected  BPH - Urinary retention -Flomax 0.4 mg daily  Thrombocytopenia -Likely due to consumption w/ active bleeding  - improving   H/O Parkinson's Disease -Sinemet 25-100 1 tab TID     Discharge Diagnoses:  Active Problems:   GIB (gastrointestinal bleeding)   Hemorrhagic shock   Lower GI bleed   Acute blood loss anemia   Elevated troponin   Demand ischemia (HCC)   Chronic atrial fibrillation (HCC)   Essential hypertension   HLD  (hyperlipidemia)   BPH (benign prostatic hyperplasia)   Parkinson's disease ([place])   Discharge Condition: stable  Diet recommendation: Heart healthy  Filed Weights   06/10/15 0500 06/11/15 0500 06/12/15 0400  Weight: 67.9 kg (149 lb 11.1 oz) 68.1 kg (150 lb 2.1 oz) 64.9 kg (143 lb 1.3 oz)    History of present illness:  48 WM with h/o CAD, Colonic diverticuli, gastric AVM clipped 2015, and Afib on warfarin who c/o nausea, fatigue, and SOB with intermittent chest pain for one month. At 6AM 5/26 he had a loose BM that was bloody, followed by 3 more bloody BMs. His family took him to [facility] and he had 10 more bloody BMs there. He denied NSAID use. At OSH he received 1mg  PO Vit K, 2U FFP and 4U PRBC for a Hg drop of 12 to 8.7 then 7.7 over 15hrs During his hospitalization patient was treated for lower GI bleed most likely from diverticulosis. On admission patient's INR was 2.79, although within therapeutic window per patient his INR normally stays 2.2-2.4. Patient had spontaneous resolution of bleeding. In addition patient's atrial fibrillation was brought under better control. Patient will be discharged WITHOUT ANTICOAGULATION. Patient has appointment with his cardiologist on 6/16, at which time they will discuss when/if to restart anticoagulation.    Consultants:  DrJennings E Nestor. PC CM   Procedures/Significant Events:  5/26 Admit to [facility] 5/26 Bleeding Scan - Active bleeding in region of cecum 5/27 Transfer from OSH for embolization 5/27 Mesenteric Angiogram - no bleeding or suspicious vascular lesions 5/29 transfuse 1U PRBC    Discharge Exam: Danley Danker  Vitals:   06/13/15 1040 06/13/15 1435 06/13/15 2138 06/14/15 0548  BP: 105/54 136/55 130/53 132/65  Pulse: 80 84 81 98  Temp:  97.6 F (36.4 C) 97.8 F (36.6 C) 98 F (36.7 C)  TempSrc:   Oral Oral  Resp:  18 16 20   Height:      Weight:      SpO2: 98% 100% 99% 99%    General: A/O 4, NAD, ambulating hallway with  the assistance of a walker without difficulty, No acute respiratory distress Eyes: negative scleral hemorrhage, negative anisocoria, negative icterus ENT: Negative Runny nose, negative gingival bleeding, Neck: Negative scars, masses, torticollis, lymphadenopathy, JVD Lungs: Clear to auscultation bilaterally without wheezes or crackles Cardiovascular: Irregular irregular rhythm and rate, without murmur gallop or rub normal S1 and S2  Discharge Instructions     Medication List    STOP taking these medications        CALCIUM PO     diltiazem 240 MG 24 hr capsule  Commonly known as:  DILACOR XR     furosemide 40 MG tablet  Commonly known as:  LASIX     pantoprazole 40 MG tablet  Commonly known as:  PROTONIX     warfarin 2 MG tablet  Commonly known as:  COUMADIN     warfarin 5 MG tablet  Commonly known as:  COUMADIN      TAKE these medications        atorvastatin 20 MG tablet  Commonly known as:  LIPITOR  Take 20 mg by mouth at bedtime.     CALCIUM 600+D 600-400 MG-UNIT tablet  Generic drug:  Calcium Carbonate-Vitamin D  Take 1 tablet by mouth daily. Reported on 06/09/2015     carbidopa-levodopa 25-100 MG tablet  Commonly known as:  SINEMET IR  Take 1 tablet by mouth 3 (three) times daily. Reported on 06/09/2015     citalopram 20 MG tablet  Commonly known as:  CELEXA  Take 20 mg by mouth daily.     HYDROcodone-acetaminophen 5-325 MG tablet  Commonly known as:  NORCO/VICODIN  Take 1 tablet by mouth every 6 (six) hours as needed for moderate pain. Reported on 06/09/2015     MAGNESIUM PO  Take 1 tablet by mouth daily.     metoprolol tartrate 25 MG tablet  Commonly known as:  LOPRESSOR  Take 25 mg by mouth 2 (two) times daily.     multivitamin with minerals Tabs tablet  Take 1 tablet by mouth daily.     omega-3 acid ethyl esters 1 g capsule  Commonly known as:  LOVAZA  Take 1 g by mouth daily.     omeprazole 20 MG capsule  Commonly known as:  PRILOSEC  Take  20 mg by mouth daily.     tamsulosin 0.4 MG Caps capsule  Commonly known as:  FLOMAX  Take 0.4 mg by mouth daily after supper.     VITAMIN C PO  Take 1 tablet by mouth daily.     XALATAN 0.005 % ophthalmic solution  Generic drug:  latanoprost  Place 1 drop into both eyes at bedtime.       Allergies  Allergen Reactions  . Ilosone [Erythromycin] Other (See Comments)    Reaction:  GI upset   . Penicillins Rash and Other (See Comments)    Has patient had a PCN reaction causing immediate rash, facial/tongue/throat swelling, SOB or lightheadedness with hypotension: No Has patient had a PCN reaction causing severe rash involving mucus membranes  or skin necrosis: No Has patient had a PCN reaction that required hospitalization No Has patient had a PCN reaction occurring within the last 10 years: No If all of the above answers are "NO", then may proceed with Cephalosporin use.      The results of significant diagnostics from this hospitalization (including imaging, microbiology, ancillary and laboratory) are listed below for reference.    Significant Diagnostic Studies: Dg Chest 2 View  06/05/2015  CLINICAL DATA:  Follow-up of pneumonia ; history of previous CABG EXAM: CHEST  2 VIEW COMPARISON:  Chest x-ray of March 07, 2015 FINDINGS: The lungs are well-expanded. There is no focal infiltrate. There is no pleural effusion. The heart is mildly enlarged but stable. The pulmonary vascularity is not engorged. There are post CABG changes. The mediastinum is normal in width. There is calcification of portions of the anterior longitudinal ligament of the thoracic spine. IMPRESSION: There is no evidence of pneumonia, CHF, nor other acute cardiopulmonary abnormality. Electronically Signed   By: David  Martinique M.D.   On: 06/05/2015 13:16   Nm Gi Blood Loss  06/08/2015  CLINICAL DATA:  Rectal bleeding.  Bright red blood per rectum. EXAM: NUCLEAR MEDICINE GASTROINTESTINAL BLEEDING SCAN TECHNIQUE:  Sequential abdominal images were obtained following intravenous administration of Tc-69m labeled red blood cells. RADIOPHARMACEUTICALS:  22.5 mCi Tc-62m in-vitro labeled red cells. COMPARISON:  CT of earlier today FINDINGS: Active bleeding is identified, originating in the right lower abdomen. Favored to be within the cecum. There may be minimal reflux into the terminal ileum. Tracer/ hemorrhage is seen subsequently in the transverse and descending colon. IMPRESSION: Active hemorrhage, likely originating within the cecum. These results will be called to the ordering clinician or representative by the Radiologist Assistant, and communication documented in the PACS or zVision Dashboard. Electronically Signed   By: Abigail Miyamoto M.D.   On: 06/08/2015 22:37   Ct Abdomen Pelvis W Contrast  06/08/2015  CLINICAL DATA:  Bright rectal bleeding since 5 a.m. today. Previous cholecystectomy, appendectomy and CABG. EXAM: CT ABDOMEN AND PELVIS WITH CONTRAST TECHNIQUE: Multidetector CT imaging of the abdomen and pelvis was performed using the standard protocol following bolus administration of intravenous contrast. CONTRAST:  169mL ISOVUE-300 IOPAMIDOL (ISOVUE-300) INJECTION 61% COMPARISON:  11/17/2006. FINDINGS: Lower chest:  Stable minimal linear scarring at both lung bases. Hepatobiliary: Mild diffuse low density of the liver relative to the spleen. Cholecystectomy clips. Pancreas: Moderate diffuse pancreatic atrophy with progression and associated pancreatic ductal dilatation. Spleen: Multiple calcified granulomas.  Normal in size. Adrenals/Urinary Tract: Small bilateral renal cysts. Normal appearing adrenal glands and ureters. Mild diffuse bladder wall thickening. The prostate gland is protruding into the bladder base. Stomach/Bowel: Multiple diverticula throughout the colon. Mild concentric wall thickening and lack of distention of the midportion of the ascending colon, measuring 5 cm in length. Mild adjacent pericolonic  soft tissue stranding proximally. Surgically absent appendix. Small sliding hiatal hernia. No small bowel abnormalities. Vascular/Lymphatic: Atheromatous arterial calcifications. No aneurysm or enlarged lymph nodes. Reproductive: Moderately to markedly enlarged and heterogeneous prostate gland with protrusion into the base of the urinary bladder. Other: Small bilateral inguinal hernias containing fat. Musculoskeletal: Lumbar and lower thoracic spine degenerative changes. IMPRESSION: 1. Mild concentric wall thickening and lack of distention of the midportion of the ascending colon, measuring 5 cm in length, in an area of multiple diverticula and mild pericolonic soft tissue stranding. This is most likely due to mild diverticulitis. A colonic neoplasm is less likely but not excluded. 2. Extensive diverticulosis  elsewhere throughout the colon. 3. Small sliding hiatal hernia. 4. Moderate diffuse pancreatic atrophy. 5. Mild diffuse hepatic steatosis. 6. Moderately to markedly enlarged and heterogeneous prostate gland. 7. Mild diffuse bladder wall thickening, compatible with chronic bladder outlet obstruction due to the enlarged prostate gland. Electronically Signed   By: Claudie Revering M.D.   On: 06/08/2015 13:43   Ir Angiogram Visceral Selective  06/09/2015  CLINICAL DATA:  Acute lower GI bleed. Positive tagged red blood cell study localizing to the ascending colon. Unstable on pressors. Recent Coumadin use, partially reversed. EXAM: SELECTIVE VISCERAL ARTERIOGRAPHY; IR ULTRASOUND GUIDANCE VASC ACCESS LEFT ANESTHESIA/SEDATION: none MEDICATIONS: Lidocaine 1% subcutaneous CONTRAST:  132mL OMNIPAQUE IOHEXOL 300 MG/ML SOLN, 72mL ISOVUE-300 IOPAMIDOL (ISOVUE-300) INJECTION 61% PROCEDURE: The procedure, risks (including but not limited to bleeding, infection, organ damage ), benefits, and alternatives were explained to the patient and family. Questions regarding the procedure were encouraged and answered. The patient  understands and consents to the procedure. Review of previous CT demonstrated significant atheromatous change in the right iliac arterial system, so left-sided approach was selected. The left femoral region prepped and draped in usual sterile fashion. Maximal barrier sterile technique was utilized including caps, mask, sterile gowns, sterile gloves, sterile drape, hand hygiene and skin antiseptic. The left common femoral artery was localized under ultrasound, patency confirmed and documented. Under real-time ultrasound guidance, the vessel was accessed with a 21-gauge micropuncture needle, exchanged over a 018 guidewire for a transitional dilator, through which a 035 guidewire was advanced. Over this, a 5 [place] vascular sheath was placed, through which a 5 [place] C2 catheter was advanced and used to selectively catheterize the superior mesenteric artery for selective arteriography in multiple projections. A RIM catheter was then utilized to selectively catheterize the inferior mesenteric artery for selective arteriography in multiple projections. The C2 was then utilized to selectively catheterize the celiac axis for selective arteriography. The catheter was removed. The sheath was secured externally with 0 Prolene suture and placed to saline flush. Site covered with a sterile dressing. The patient tolerated the procedure well. COMPLICATIONS: None immediate FINDINGS: No evidence of active extravasation, early draining vein, AVM, or other lesion to suggest a site or etiology of the patient's GI bleeding. Venous phase confirms patency of the SMV, IMV and portal venous system. IMPRESSION: 1. Negative three-vessel mesenteric arteriogram. No evidence of active extravasation or other focal lesion to suggest etiology of GI bleed. Critical Value/emergent results were called by telephone at the conclusion of the procedure on 06/09/2015 at 3:50am to Dr. Boone Master , who verbally acknowledged these results. Electronically  Signed   By: Lucrezia Europe M.D.   On: 06/09/2015 09:09   Ir Angiogram Visceral Selective  06/09/2015  CLINICAL DATA:  Acute lower GI bleed. Positive tagged red blood cell study localizing to the ascending colon. Unstable on pressors. Recent Coumadin use, partially reversed. EXAM: SELECTIVE VISCERAL ARTERIOGRAPHY; IR ULTRASOUND GUIDANCE VASC ACCESS LEFT ANESTHESIA/SEDATION: none MEDICATIONS: Lidocaine 1% subcutaneous CONTRAST:  170mL OMNIPAQUE IOHEXOL 300 MG/ML SOLN, 22mL ISOVUE-300 IOPAMIDOL (ISOVUE-300) INJECTION 61% PROCEDURE: The procedure, risks (including but not limited to bleeding, infection, organ damage ), benefits, and alternatives were explained to the patient and family. Questions regarding the procedure were encouraged and answered. The patient understands and consents to the procedure. Review of previous CT demonstrated significant atheromatous change in the right iliac arterial system, so left-sided approach was selected. The left femoral region prepped and draped in usual sterile fashion. Maximal barrier sterile technique was utilized including caps, mask,  sterile gowns, sterile gloves, sterile drape, hand hygiene and skin antiseptic. The left common femoral artery was localized under ultrasound, patency confirmed and documented. Under real-time ultrasound guidance, the vessel was accessed with a 21-gauge micropuncture needle, exchanged over a 018 guidewire for a transitional dilator, through which a 035 guidewire was advanced. Over this, a 5 [place] vascular sheath was placed, through which a 5 [place] C2 catheter was advanced and used to selectively catheterize the superior mesenteric artery for selective arteriography in multiple projections. A RIM catheter was then utilized to selectively catheterize the inferior mesenteric artery for selective arteriography in multiple projections. The C2 was then utilized to selectively catheterize the celiac axis for selective arteriography. The catheter was  removed. The sheath was secured externally with 0 Prolene suture and placed to saline flush. Site covered with a sterile dressing. The patient tolerated the procedure well. COMPLICATIONS: None immediate FINDINGS: No evidence of active extravasation, early draining vein, AVM, or other lesion to suggest a site or etiology of the patient's GI bleeding. Venous phase confirms patency of the SMV, IMV and portal venous system. IMPRESSION: 1. Negative three-vessel mesenteric arteriogram. No evidence of active extravasation or other focal lesion to suggest etiology of GI bleed. Critical Value/emergent results were called by telephone at the conclusion of the procedure on 06/09/2015 at 3:50am to Dr. Boone Master , who verbally acknowledged these results. Electronically Signed   By: Lucrezia Europe M.D.   On: 06/09/2015 09:09   Ir Angiogram Visceral Selective  06/09/2015  CLINICAL DATA:  Acute lower GI bleed. Positive tagged red blood cell study localizing to the ascending colon. Unstable on pressors. Recent Coumadin use, partially reversed. EXAM: SELECTIVE VISCERAL ARTERIOGRAPHY; IR ULTRASOUND GUIDANCE VASC ACCESS LEFT ANESTHESIA/SEDATION: none MEDICATIONS: Lidocaine 1% subcutaneous CONTRAST:  115mL OMNIPAQUE IOHEXOL 300 MG/ML SOLN, 6mL ISOVUE-300 IOPAMIDOL (ISOVUE-300) INJECTION 61% PROCEDURE: The procedure, risks (including but not limited to bleeding, infection, organ damage ), benefits, and alternatives were explained to the patient and family. Questions regarding the procedure were encouraged and answered. The patient understands and consents to the procedure. Review of previous CT demonstrated significant atheromatous change in the right iliac arterial system, so left-sided approach was selected. The left femoral region prepped and draped in usual sterile fashion. Maximal barrier sterile technique was utilized including caps, mask, sterile gowns, sterile gloves, sterile drape, hand hygiene and skin antiseptic. The left  common femoral artery was localized under ultrasound, patency confirmed and documented. Under real-time ultrasound guidance, the vessel was accessed with a 21-gauge micropuncture needle, exchanged over a 018 guidewire for a transitional dilator, through which a 035 guidewire was advanced. Over this, a 5 [place] vascular sheath was placed, through which a 5 [place] C2 catheter was advanced and used to selectively catheterize the superior mesenteric artery for selective arteriography in multiple projections. A RIM catheter was then utilized to selectively catheterize the inferior mesenteric artery for selective arteriography in multiple projections. The C2 was then utilized to selectively catheterize the celiac axis for selective arteriography. The catheter was removed. The sheath was secured externally with 0 Prolene suture and placed to saline flush. Site covered with a sterile dressing. The patient tolerated the procedure well. COMPLICATIONS: None immediate FINDINGS: No evidence of active extravasation, early draining vein, AVM, or other lesion to suggest a site or etiology of the patient's GI bleeding. Venous phase confirms patency of the SMV, IMV and portal venous system. IMPRESSION: 1. Negative three-vessel mesenteric arteriogram. No evidence of active extravasation or other focal lesion to  suggest etiology of GI bleed. Critical Value/emergent results were called by telephone at the conclusion of the procedure on 06/09/2015 at 3:50am to Dr. Boone Master , who verbally acknowledged these results. Electronically Signed   By: Lucrezia Europe M.D.   On: 06/09/2015 09:09   Ir US Guide Vasc Access Left  06/09/2015  CLINICAL DATA:  Acute lower GI bleed. Positive tagged red blood cell study localizing to the ascending colon. Unstable on pressors. Recent Coumadin use, partially reversed. EXAM: SELECTIVE VISCERAL ARTERIOGRAPHY; IR ULTRASOUND GUIDANCE VASC ACCESS LEFT ANESTHESIA/SEDATION: none MEDICATIONS: Lidocaine 1%  subcutaneous CONTRAST:  112mL OMNIPAQUE IOHEXOL 300 MG/ML SOLN, 97mL ISOVUE-300 IOPAMIDOL (ISOVUE-300) INJECTION 61% PROCEDURE: The procedure, risks (including but not limited to bleeding, infection, organ damage ), benefits, and alternatives were explained to the patient and family. Questions regarding the procedure were encouraged and answered. The patient understands and consents to the procedure. Review of previous CT demonstrated significant atheromatous change in the right iliac arterial system, so left-sided approach was selected. The left femoral region prepped and draped in usual sterile fashion. Maximal barrier sterile technique was utilized including caps, mask, sterile gowns, sterile gloves, sterile drape, hand hygiene and skin antiseptic. The left common femoral artery was localized under ultrasound, patency confirmed and documented. Under real-time ultrasound guidance, the vessel was accessed with a 21-gauge micropuncture needle, exchanged over a 018 guidewire for a transitional dilator, through which a 035 guidewire was advanced. Over this, a 5 [place] vascular sheath was placed, through which a 5 [place] C2 catheter was advanced and used to selectively catheterize the superior mesenteric artery for selective arteriography in multiple projections. A RIM catheter was then utilized to selectively catheterize the inferior mesenteric artery for selective arteriography in multiple projections. The C2 was then utilized to selectively catheterize the celiac axis for selective arteriography. The catheter was removed. The sheath was secured externally with 0 Prolene suture and placed to saline flush. Site covered with a sterile dressing. The patient tolerated the procedure well. COMPLICATIONS: None immediate FINDINGS: No evidence of active extravasation, early draining vein, AVM, or other lesion to suggest a site or etiology of the patient's GI bleeding. Venous phase confirms patency of the SMV, IMV and portal  venous system. IMPRESSION: 1. Negative three-vessel mesenteric arteriogram. No evidence of active extravasation or other focal lesion to suggest etiology of GI bleed. Critical Value/emergent results were called by telephone at the conclusion of the procedure on 06/09/2015 at 3:50am to Dr. Boone Master , who verbally acknowledged these results. Electronically Signed   By: Lucrezia Europe M.D.   On: 06/09/2015 09:09    Microbiology: Recent Results (from the past 240 hour(s))  MRSA PCR Screening     Status: None   Collection Time: 06/08/15 10:29 PM  Result Value Ref Range Status   MRSA by PCR NEGATIVE NEGATIVE Final    Comment:        The GeneXpert MRSA Assay (FDA approved for NASAL specimens only), is one component of a comprehensive MRSA colonization surveillance program. It is not intended to diagnose MRSA infection nor to guide or monitor treatment for MRSA infections.      Labs: Basic Metabolic Panel:  Recent Labs Lab 06/09/15 0435  06/10/15 0437 06/10/15 1415 06/10/15 1418 06/11/15 0350 06/12/15 0350 06/14/15 0536  NA 136  < > 139 135 137 137 135 134*  K 3.7  < > 2.6* 3.9 3.9 3.6 4.4 4.0  CL 110  < > 113* 104 99* 105 103 102  CO2  20*  < > 23 25  --  28 29 26   GLUCOSE 192*  < > 97 114* 111* 111* 109* 95  BUN 14  < > <5* <5* 3* <5* 9 12  CREATININE 0.94  < > 0.61 0.65 0.60* 0.74 0.82 0.78  CALCIUM 7.3*  < > 6.5* 8.0*  --  7.9* 8.1* 8.3*  MG 1.4*  --  1.3* 1.9  --  1.8  --   --   PHOS 2.0*  --  1.4* 2.7  --  2.1*  --   --   < > = values in this interval not displayed. Liver Function Tests:  Recent Labs Lab 06/08/15 0958 06/10/15 0437 06/11/15 0350 06/12/15 0350  AST 27  --   --  29  ALT 21  --   --  21  ALKPHOS 69  --   --  57  BILITOT 0.8  --   --  0.6  PROT 6.6  --   --  4.8*  ALBUMIN 3.7 2.2* 2.5* 2.6*   No results for input(s): LIPASE, AMYLASE in the last 168 hours. No results for input(s): AMMONIA in the last 168 hours. CBC:  Recent Labs Lab  06/08/15 1615  06/10/15 0437  06/11/15 0350 06/11/15 1602 06/12/15 0350 06/13/15 1344 06/14/15 0536  WBC 10.0  < > 10.2  --  10.7* 10.6* 9.5 8.1 7.3  NEUTROABS 7.6*  --  8.4*  --  8.3*  --   --   --   --   HGB 8.7*  < > 7.7*  < > 7.5* 9.9* 9.2* 9.8* 9.8*  HCT 26.1*  < > 22.2*  < > 22.0* 29.5* 27.6* 30.0* 29.5*  MCV 91.3  < > 85.7  --  87.0 87.0 87.9 88.2 87.3  PLT 187  < > 74*  --  94* 107* 108* 183 192  < > = values in this interval not displayed. Cardiac Enzymes:  Recent Labs Lab 06/09/15 1030 06/09/15 1619 06/09/15 2149 06/11/15 1602 06/12/15 0350  TROPONINI 0.10* 0.11* 0.13* 0.13* 0.12*   BNP: BNP (last 3 results) No results for input(s): BNP in the last 8760 hours.  ProBNP (last 3 results) No results for input(s): PROBNP in the last 8760 hours.  CBG:  Recent Labs Lab 06/08/15 2142 06/09/15 [id number]  GLUCAP 159* [phone]*       Signed:  Dia Crawford, MD [facility] [phone] pager

## 2015-06-14 NOTE — Telephone Encounter (Signed)
Called [place].  Discussed pts hospitalization.  Will notify me if any questions or problems.

## 2015-06-19 DIAGNOSIS — I2510 Atherosclerotic heart disease of native coronary artery without angina pectoris: Secondary | ICD-10-CM | POA: Diagnosis not present

## 2015-06-19 DIAGNOSIS — I482 Chronic atrial fibrillation: Secondary | ICD-10-CM | POA: Diagnosis not present

## 2015-06-19 DIAGNOSIS — I10 Essential (primary) hypertension: Secondary | ICD-10-CM | POA: Diagnosis not present

## 2015-06-19 DIAGNOSIS — R339 Retention of urine, unspecified: Secondary | ICD-10-CM | POA: Diagnosis not present

## 2015-06-19 DIAGNOSIS — Z951 Presence of aortocoronary bypass graft: Secondary | ICD-10-CM | POA: Diagnosis not present

## 2015-06-19 DIAGNOSIS — N401 Enlarged prostate with lower urinary tract symptoms: Secondary | ICD-10-CM | POA: Diagnosis not present

## 2015-06-19 DIAGNOSIS — G20 Parkinson's disease: Secondary | ICD-10-CM | POA: Diagnosis not present

## 2015-06-20 ENCOUNTER — Ambulatory Visit (INDEPENDENT_AMBULATORY_CARE_PROVIDER_SITE_OTHER): Payer: Medicare HMO | Admitting: Internal Medicine

## 2015-06-20 ENCOUNTER — Encounter: Payer: Self-pay | Admitting: Internal Medicine

## 2015-06-20 VITALS — BP 122/70 | HR 70 | Temp 98.7°F | Resp 16 | Ht 66.0 in | Wt 142.0 lb

## 2015-06-20 DIAGNOSIS — I10 Essential (primary) hypertension: Secondary | ICD-10-CM | POA: Diagnosis not present

## 2015-06-20 DIAGNOSIS — R350 Frequency of micturition: Secondary | ICD-10-CM

## 2015-06-20 DIAGNOSIS — K922 Gastrointestinal hemorrhage, unspecified: Secondary | ICD-10-CM

## 2015-06-20 DIAGNOSIS — I2510 Atherosclerotic heart disease of native coronary artery without angina pectoris: Secondary | ICD-10-CM

## 2015-06-20 DIAGNOSIS — I4891 Unspecified atrial fibrillation: Secondary | ICD-10-CM

## 2015-06-20 DIAGNOSIS — D508 Other iron deficiency anemias: Secondary | ICD-10-CM | POA: Diagnosis not present

## 2015-06-20 LAB — CBC WITH DIFFERENTIAL/PLATELET
BASOS PCT: 0.5 % (ref 0.0–3.0)
Basophils Absolute: 0 10*3/uL (ref 0.0–0.1)
EOS PCT: 0.9 % (ref 0.0–5.0)
Eosinophils Absolute: 0 10*3/uL (ref 0.0–0.7)
HCT: 33.5 % — ABNORMAL LOW (ref 39.0–52.0)
HEMOGLOBIN: 11.1 g/dL — AB (ref 13.0–17.0)
Lymphocytes Relative: 18.6 % (ref 12.0–46.0)
Lymphs Abs: 1.1 10*3/uL (ref 0.7–4.0)
MCHC: 33.1 g/dL (ref 30.0–36.0)
MCV: 89.9 fl (ref 78.0–100.0)
MONO ABS: 0.8 10*3/uL (ref 0.1–1.0)
MONOS PCT: 14.4 % — AB (ref 3.0–12.0)
Neutro Abs: 3.7 10*3/uL (ref 1.4–7.7)
Neutrophils Relative %: 65.6 % (ref 43.0–77.0)
Platelets: 395 10*3/uL (ref 150.0–400.0)
RBC: 3.72 Mil/uL — AB (ref 4.22–5.81)
RDW: 16.4 % — AB (ref 11.5–15.5)
WBC: 5.7 10*3/uL (ref 4.0–10.5)

## 2015-06-20 LAB — URINALYSIS, ROUTINE W REFLEX MICROSCOPIC
Bilirubin Urine: NEGATIVE
Hgb urine dipstick: NEGATIVE
KETONES UR: NEGATIVE
Leukocytes, UA: NEGATIVE
Nitrite: NEGATIVE
PH: 6 (ref 5.0–8.0)
SPECIFIC GRAVITY, URINE: 1.015 (ref 1.000–1.030)
Total Protein, Urine: NEGATIVE
Urine Glucose: NEGATIVE
Urobilinogen, UA: 0.2 (ref 0.0–1.0)

## 2015-06-20 LAB — BASIC METABOLIC PANEL
BUN: 13 mg/dL (ref 6–23)
CALCIUM: 8.9 mg/dL (ref 8.4–10.5)
CO2: 29 meq/L (ref 19–32)
CREATININE: 0.77 mg/dL (ref 0.40–1.50)
Chloride: 100 mEq/L (ref 96–112)
GFR: 101.35 mL/min (ref >60.00)
GLUCOSE: 103 mg/dL — AB (ref 70–99)
Potassium: 4.8 mEq/L (ref 3.5–5.1)
SODIUM: 135 meq/L (ref 135–145)

## 2015-06-20 LAB — FERRITIN: FERRITIN: 94.2 ng/mL (ref 22.0–322.0)

## 2015-06-20 NOTE — Progress Notes (Signed)
[name]: Tyler Stout, male   DOB: [birth year], 82 y.o.   MRN: [id number]   Subjective:    [name]: Tyler Stout, male    DOB: 20-Dec-1925, 82 y.o.   MRN: [id number]  HPI  Patient here for a hospital follow up.  Was recently hospitalized with lower GI bleed - felt to be diverticular bleed. Required transfusions.  Since his discharge, he has not noticed any bleeding.  Did notice some dark stool today.  On multivitamin.  No chest pain.  No sob.  Some fatigue.  Feels energy is improving.  No abdominal pain or cramping.  No diarrhea.  On metoprolol.  cardizem on hold.     Past Medical History  Diagnosis Date  . CAD (coronary artery disease)     s/p bypass graft x 4 (1991)  . GERD (gastroesophageal reflux disease)   . Atrial fibrillation ([place])   . Anemia     iron deficiency  . Hypertension   . Hypercholesterolemia   . Pancreatitis     x2 (1994 and 1998), presumed to be gallstone pancreatitis.  s/p ERCP and sphincterotomy  . Bilateral renal cysts     worked up by Dr Bernardo Heater  . Glaucoma   . BPH (benign prostatic hypertrophy)   . Diverticulosis   . Arthritis    Past Surgical History  Procedure Laterality Date  . Appendectomy    . Coronary artery bypass graft      x 4,  1991  . Cholecystectomy  1994  . Inguinal hernia repair  2009  . Open heart surgery  1991   Family History  Problem Relation Age of Onset  . Throat cancer Father     oropharyngeal cancer  . Kidney disease Sister   . Heart disease    . Colon cancer Neg Hx   . Prostate cancer Neg Hx    Social History   Social History  . Marital Status: Married    Spouse Name: N/A  . Number of Children: N/A  . Years of Education: N/A   Social History Main Topics  . Smoking status: Former Smoker    Quit date: 01/14/1964  . Smokeless tobacco: Never Used  . Alcohol Use: No  . Drug Use: No  . Sexual Activity: Not Asked   Other Topics Concern  . None   Social History Narrative    Outpatient Encounter  Prescriptions as of 06/20/2015  Medication Sig  . atorvastatin (LIPITOR) 20 MG tablet Take 20 mg by mouth at bedtime.  . citalopram (CELEXA) 20 MG tablet Take 20 mg by mouth daily.  Marland Kitchen HYDROcodone-acetaminophen (NORCO/VICODIN) 5-325 MG tablet Take 1 tablet by mouth every 6 (six) hours as needed for moderate pain. Reported on 06/09/2015  . latanoprost (XALATAN) 0.005 % ophthalmic solution Place 1 drop into both eyes at bedtime.   Marland Kitchen MAGNESIUM PO Take 1 tablet by mouth daily. Reported on 06/20/2015  . metoprolol tartrate (LOPRESSOR) 25 MG tablet Take 25 mg by mouth 2 (two) times daily.  . Multiple Vitamin (MULTIVITAMIN WITH MINERALS) TABS tablet Take 1 tablet by mouth daily.  Marland Kitchen omeprazole (PRILOSEC) 20 MG capsule Take 20 mg by mouth daily.  . tamsulosin (FLOMAX) 0.4 MG CAPS capsule Take 0.4 mg by mouth daily after supper.   . Ascorbic Acid (VITAMIN C PO) Take 1 tablet by mouth daily. Reported on 06/20/2015  . Calcium Carbonate-Vitamin D (CALCIUM 600+D) 600-400 MG-UNIT per tablet Take 1 tablet by mouth daily. Reported on 06/20/2015  . carbidopa-levodopa (  SINEMET IR) 25-100 MG tablet Take 1 tablet by mouth 3 (three) times daily. Reported on 06/20/2015  . omega-3 acid ethyl esters (LOVAZA) 1 g capsule Take 1 g by mouth daily. Reported on 06/20/2015   No facility-administered encounter medications on file as of 06/20/2015.    Review of Systems  Constitutional: Positive for fatigue. Negative for appetite change and unexpected weight change.  HENT: Negative for congestion and sinus pressure.   Respiratory: Negative for cough, chest tightness and shortness of breath.   Cardiovascular: Negative for chest pain, palpitations and leg swelling.  Gastrointestinal: Negative for nausea, vomiting and abdominal pain.  Genitourinary: Negative for dysuria and difficulty urinating.  Musculoskeletal: Negative for myalgias and joint swelling.  Skin: Negative for color change and rash.  Neurological: Negative for dizziness,  light-headedness and headaches.  Psychiatric/Behavioral: Negative for dysphoric mood and agitation.       Objective:    Physical Exam  Constitutional: He appears well-developed and well-nourished. No distress.  HENT:  Nose: Nose normal.  Mouth/Throat: Oropharynx is clear and moist.  Neck: Neck supple.  Cardiovascular: Normal rate and regular rhythm.   Pulmonary/Chest: Effort normal and breath sounds normal. No respiratory distress.  Abdominal: Soft. Bowel sounds are normal. There is no tenderness.  Genitourinary:  Rectal exam - no gross blood.  Heme negative.   Musculoskeletal: He exhibits no edema or tenderness.  Lymphadenopathy:    He has no cervical adenopathy.  Skin: No rash noted. No erythema.  Psychiatric: He has a normal mood and affect. His behavior is normal.    BP 122/70 mmHg  Pulse 70  Temp(Src) 98.7 F (37.1 C) (Oral)  Resp 16  Ht 5\' 6"  (1.676 m)  Wt 142 lb (64.411 kg)  BMI 22.93 kg/m2  SpO2 78% Wt Readings from Last 3 Encounters:  06/20/15 142 lb (64.411 kg)  06/12/15 143 lb 1.3 oz (64.9 kg)  06/08/15 140 lb (63.504 kg)     Lab Results  Component Value Date   WBC 5.7 06/20/2015   HGB 11.1* 06/20/2015   HCT 33.5* 06/20/2015   PLT 395.0 06/20/2015   GLUCOSE 103* 06/20/2015   CHOL 100 03/12/2015   TRIG 83.0 03/12/2015   HDL 33.20* 03/12/2015   LDLCALC 50 03/12/2015   ALT 21 06/12/2015   AST 29 06/12/2015   NA 135 06/20/2015   K 4.8 06/20/2015   CL 100 06/20/2015   CREATININE 0.77 06/20/2015   BUN 13 06/20/2015   CO2 29 06/20/2015   TSH 1.99 05/01/2014   INR 1.20 06/14/2015   HGBA1C 6.2 03/15/2012    Ir Angiogram Visceral Selective  06/09/2015  CLINICAL DATA:  Acute lower GI bleed. Positive tagged red blood cell study localizing to the ascending colon. Unstable on pressors. Recent Coumadin use, partially reversed. EXAM: SELECTIVE VISCERAL ARTERIOGRAPHY; IR ULTRASOUND GUIDANCE VASC ACCESS LEFT ANESTHESIA/SEDATION: none MEDICATIONS: Lidocaine  1% subcutaneous CONTRAST:  13mL OMNIPAQUE IOHEXOL 300 MG/ML SOLN, 76mL ISOVUE-300 IOPAMIDOL (ISOVUE-300) INJECTION 61% PROCEDURE: The procedure, risks (including but not limited to bleeding, infection, organ damage ), benefits, and alternatives were explained to the patient and family. Questions regarding the procedure were encouraged and answered. The patient understands and consents to the procedure. Review of previous CT demonstrated significant atheromatous change in the right iliac arterial system, so left-sided approach was selected. The left femoral region prepped and draped in usual sterile fashion. Maximal barrier sterile technique was utilized including caps, mask, sterile gowns, sterile gloves, sterile drape, hand hygiene and skin antiseptic. The left common  femoral artery was localized under ultrasound, patency confirmed and documented. Under real-time ultrasound guidance, the vessel was accessed with a 21-gauge micropuncture needle, exchanged over a 018 guidewire for a transitional dilator, through which a 035 guidewire was advanced. Over this, a 5 [place] vascular sheath was placed, through which a 5 [place] C2 catheter was advanced and used to selectively catheterize the superior mesenteric artery for selective arteriography in multiple projections. A RIM catheter was then utilized to selectively catheterize the inferior mesenteric artery for selective arteriography in multiple projections. The C2 was then utilized to selectively catheterize the celiac axis for selective arteriography. The catheter was removed. The sheath was secured externally with 0 Prolene suture and placed to saline flush. Site covered with a sterile dressing. The patient tolerated the procedure well. COMPLICATIONS: None immediate FINDINGS: No evidence of active extravasation, early draining vein, AVM, or other lesion to suggest a site or etiology of the patient's GI bleeding. Venous phase confirms patency of the SMV, IMV and  portal venous system. IMPRESSION: 1. Negative three-vessel mesenteric arteriogram. No evidence of active extravasation or other focal lesion to suggest etiology of GI bleed. Critical Value/emergent results were called by telephone at the conclusion of the procedure on 06/09/2015 at 3:50am to Dr. Boone Master , who verbally acknowledged these results. Electronically Signed   By: Lucrezia Europe M.D.   On: 06/09/2015 09:09   Ir Angiogram Visceral Selective  06/09/2015  CLINICAL DATA:  Acute lower GI bleed. Positive tagged red blood cell study localizing to the ascending colon. Unstable on pressors. Recent Coumadin use, partially reversed. EXAM: SELECTIVE VISCERAL ARTERIOGRAPHY; IR ULTRASOUND GUIDANCE VASC ACCESS LEFT ANESTHESIA/SEDATION: none MEDICATIONS: Lidocaine 1% subcutaneous CONTRAST:  16mL OMNIPAQUE IOHEXOL 300 MG/ML SOLN, 14mL ISOVUE-300 IOPAMIDOL (ISOVUE-300) INJECTION 61% PROCEDURE: The procedure, risks (including but not limited to bleeding, infection, organ damage ), benefits, and alternatives were explained to the patient and family. Questions regarding the procedure were encouraged and answered. The patient understands and consents to the procedure. Review of previous CT demonstrated significant atheromatous change in the right iliac arterial system, so left-sided approach was selected. The left femoral region prepped and draped in usual sterile fashion. Maximal barrier sterile technique was utilized including caps, mask, sterile gowns, sterile gloves, sterile drape, hand hygiene and skin antiseptic. The left common femoral artery was localized under ultrasound, patency confirmed and documented. Under real-time ultrasound guidance, the vessel was accessed with a 21-gauge micropuncture needle, exchanged over a 018 guidewire for a transitional dilator, through which a 035 guidewire was advanced. Over this, a 5 [place] vascular sheath was placed, through which a 5 [place] C2 catheter was advanced and used to  selectively catheterize the superior mesenteric artery for selective arteriography in multiple projections. A RIM catheter was then utilized to selectively catheterize the inferior mesenteric artery for selective arteriography in multiple projections. The C2 was then utilized to selectively catheterize the celiac axis for selective arteriography. The catheter was removed. The sheath was secured externally with 0 Prolene suture and placed to saline flush. Site covered with a sterile dressing. The patient tolerated the procedure well. COMPLICATIONS: None immediate FINDINGS: No evidence of active extravasation, early draining vein, AVM, or other lesion to suggest a site or etiology of the patient's GI bleeding. Venous phase confirms patency of the SMV, IMV and portal venous system. IMPRESSION: 1. Negative three-vessel mesenteric arteriogram. No evidence of active extravasation or other focal lesion to suggest etiology of GI bleed. Critical Value/emergent results were called by telephone at the  conclusion of the procedure on 06/09/2015 at 3:50am to Dr. Boone Master , who verbally acknowledged these results. Electronically Signed   By: Lucrezia Europe M.D.   On: 06/09/2015 09:09   Ir Angiogram Visceral Selective  06/09/2015  CLINICAL DATA:  Acute lower GI bleed. Positive tagged red blood cell study localizing to the ascending colon. Unstable on pressors. Recent Coumadin use, partially reversed. EXAM: SELECTIVE VISCERAL ARTERIOGRAPHY; IR ULTRASOUND GUIDANCE VASC ACCESS LEFT ANESTHESIA/SEDATION: none MEDICATIONS: Lidocaine 1% subcutaneous CONTRAST:  182mL OMNIPAQUE IOHEXOL 300 MG/ML SOLN, 82mL ISOVUE-300 IOPAMIDOL (ISOVUE-300) INJECTION 61% PROCEDURE: The procedure, risks (including but not limited to bleeding, infection, organ damage ), benefits, and alternatives were explained to the patient and family. Questions regarding the procedure were encouraged and answered. The patient understands and consents to the procedure.  Review of previous CT demonstrated significant atheromatous change in the right iliac arterial system, so left-sided approach was selected. The left femoral region prepped and draped in usual sterile fashion. Maximal barrier sterile technique was utilized including caps, mask, sterile gowns, sterile gloves, sterile drape, hand hygiene and skin antiseptic. The left common femoral artery was localized under ultrasound, patency confirmed and documented. Under real-time ultrasound guidance, the vessel was accessed with a 21-gauge micropuncture needle, exchanged over a 018 guidewire for a transitional dilator, through which a 035 guidewire was advanced. Over this, a 5 [place] vascular sheath was placed, through which a 5 [place] C2 catheter was advanced and used to selectively catheterize the superior mesenteric artery for selective arteriography in multiple projections. A RIM catheter was then utilized to selectively catheterize the inferior mesenteric artery for selective arteriography in multiple projections. The C2 was then utilized to selectively catheterize the celiac axis for selective arteriography. The catheter was removed. The sheath was secured externally with 0 Prolene suture and placed to saline flush. Site covered with a sterile dressing. The patient tolerated the procedure well. COMPLICATIONS: None immediate FINDINGS: No evidence of active extravasation, early draining vein, AVM, or other lesion to suggest a site or etiology of the patient's GI bleeding. Venous phase confirms patency of the SMV, IMV and portal venous system. IMPRESSION: 1. Negative three-vessel mesenteric arteriogram. No evidence of active extravasation or other focal lesion to suggest etiology of GI bleed. Critical Value/emergent results were called by telephone at the conclusion of the procedure on 06/09/2015 at 3:50am to Dr. Boone Master , who verbally acknowledged these results. Electronically Signed   By: Lucrezia Europe M.D.   On:  06/09/2015 09:09   Ir US Guide Vasc Access Left  06/09/2015  CLINICAL DATA:  Acute lower GI bleed. Positive tagged red blood cell study localizing to the ascending colon. Unstable on pressors. Recent Coumadin use, partially reversed. EXAM: SELECTIVE VISCERAL ARTERIOGRAPHY; IR ULTRASOUND GUIDANCE VASC ACCESS LEFT ANESTHESIA/SEDATION: none MEDICATIONS: Lidocaine 1% subcutaneous CONTRAST:  134mL OMNIPAQUE IOHEXOL 300 MG/ML SOLN, 39mL ISOVUE-300 IOPAMIDOL (ISOVUE-300) INJECTION 61% PROCEDURE: The procedure, risks (including but not limited to bleeding, infection, organ damage ), benefits, and alternatives were explained to the patient and family. Questions regarding the procedure were encouraged and answered. The patient understands and consents to the procedure. Review of previous CT demonstrated significant atheromatous change in the right iliac arterial system, so left-sided approach was selected. The left femoral region prepped and draped in usual sterile fashion. Maximal barrier sterile technique was utilized including caps, mask, sterile gowns, sterile gloves, sterile drape, hand hygiene and skin antiseptic. The left common femoral artery was localized under ultrasound, patency confirmed and documented. Under real-time  ultrasound guidance, the vessel was accessed with a 21-gauge micropuncture needle, exchanged over a 018 guidewire for a transitional dilator, through which a 035 guidewire was advanced. Over this, a 5 [place] vascular sheath was placed, through which a 5 [place] C2 catheter was advanced and used to selectively catheterize the superior mesenteric artery for selective arteriography in multiple projections. A RIM catheter was then utilized to selectively catheterize the inferior mesenteric artery for selective arteriography in multiple projections. The C2 was then utilized to selectively catheterize the celiac axis for selective arteriography. The catheter was removed. The sheath was secured  externally with 0 Prolene suture and placed to saline flush. Site covered with a sterile dressing. The patient tolerated the procedure well. COMPLICATIONS: None immediate FINDINGS: No evidence of active extravasation, early draining vein, AVM, or other lesion to suggest a site or etiology of the patient's GI bleeding. Venous phase confirms patency of the SMV, IMV and portal venous system. IMPRESSION: 1. Negative three-vessel mesenteric arteriogram. No evidence of active extravasation or other focal lesion to suggest etiology of GI bleed. Critical Value/emergent results were called by telephone at the conclusion of the procedure on 06/09/2015 at 3:50am to Dr. Boone Master , who verbally acknowledged these results. Electronically Signed   By: Lucrezia Europe M.D.   On: 06/09/2015 09:09       Assessment & Plan:   Problem List Items Addressed This Visit    Anemia    Recently hospitalized with lower GI bleed.  See notes.  Required multiple transfusions.  Recheck cbc today.        Atrial fibrillation ([place])    Rate controlled.  Had been maintained on coumadin.  Off now given recent GI bleed.  Has f/u with Dr Ubaldo Glassing next week.  Will discuss anticoagulation with him.  Remain off for now.        CAD (coronary artery disease)    Currently asymptomatic.  Followed by cardiology.        Hypertension    Blood pressure doing well on current medication regimen.  His outside checks mostly averaging 120-140/60s.  Remain off cardizem.  On metoprolol.  Follow pressures.  Follow metabolic panel.        Lower GI bleed - Primary    Recently admitted.  Hospital notes reviewed.  Required multiple transfusions.  Dark stool noticed.  Heme negative on exam today.  Check cbc today.       Relevant Orders   CBC with Differential/Platelet (Completed)   Ferritin (Completed)   Urinary frequency   Relevant Orders   Basic metabolic panel (Completed)   Urinalysis, Routine w reflex microscopic (not at [facility]) (Completed)    CULTURE, URINE COMPREHENSIVE (Completed)       [name], MD

## 2015-06-22 DIAGNOSIS — H402232 Chronic angle-closure glaucoma, bilateral, moderate stage: Secondary | ICD-10-CM | POA: Diagnosis not present

## 2015-06-22 DIAGNOSIS — H02056 Trichiasis without entropian left eye, unspecified eyelid: Secondary | ICD-10-CM | POA: Diagnosis not present

## 2015-06-22 LAB — CULTURE, URINE COMPREHENSIVE

## 2015-06-23 ENCOUNTER — Encounter: Payer: Self-pay | Admitting: Internal Medicine

## 2015-06-23 NOTE — Assessment & Plan Note (Signed)
Rate controlled.  Had been maintained on coumadin.  Off now given recent GI bleed.  Has f/u with Dr Ubaldo Glassing next week.  Will discuss anticoagulation with him.  Remain off for now.

## 2015-06-23 NOTE — Assessment & Plan Note (Signed)
Currently asymptomatic.  Followed by cardiology.

## 2015-06-23 NOTE — Assessment & Plan Note (Signed)
Recently hospitalized with lower GI bleed.  See notes.  Required multiple transfusions.  Recheck cbc today.

## 2015-06-23 NOTE — Assessment & Plan Note (Signed)
Recently admitted.  Hospital notes reviewed.  Required multiple transfusions.  Dark stool noticed.  Heme negative on exam today.  Check cbc today.

## 2015-06-23 NOTE — Assessment & Plan Note (Signed)
Blood pressure doing well on current medication regimen.  His outside checks mostly averaging 120-140/60s.  Remain off cardizem.  On metoprolol.  Follow pressures.  Follow metabolic panel.

## 2015-06-26 DIAGNOSIS — Z0120 Encounter for dental examination and cleaning without abnormal findings: Secondary | ICD-10-CM | POA: Diagnosis not present

## 2015-06-27 DIAGNOSIS — M9933 Osseous stenosis of neural canal of lumbar region: Secondary | ICD-10-CM | POA: Diagnosis not present

## 2015-06-28 DIAGNOSIS — I2510 Atherosclerotic heart disease of native coronary artery without angina pectoris: Secondary | ICD-10-CM | POA: Diagnosis not present

## 2015-06-28 DIAGNOSIS — R339 Retention of urine, unspecified: Secondary | ICD-10-CM | POA: Diagnosis not present

## 2015-06-28 DIAGNOSIS — I482 Chronic atrial fibrillation: Secondary | ICD-10-CM | POA: Diagnosis not present

## 2015-06-28 DIAGNOSIS — I10 Essential (primary) hypertension: Secondary | ICD-10-CM | POA: Diagnosis not present

## 2015-06-28 DIAGNOSIS — N401 Enlarged prostate with lower urinary tract symptoms: Secondary | ICD-10-CM | POA: Diagnosis not present

## 2015-06-28 DIAGNOSIS — Z951 Presence of aortocoronary bypass graft: Secondary | ICD-10-CM | POA: Diagnosis not present

## 2015-06-28 DIAGNOSIS — G20 Parkinson's disease: Secondary | ICD-10-CM | POA: Diagnosis not present

## 2015-06-29 DIAGNOSIS — N401 Enlarged prostate with lower urinary tract symptoms: Secondary | ICD-10-CM | POA: Diagnosis not present

## 2015-06-29 DIAGNOSIS — I480 Paroxysmal atrial fibrillation: Secondary | ICD-10-CM | POA: Diagnosis not present

## 2015-06-29 DIAGNOSIS — I10 Essential (primary) hypertension: Secondary | ICD-10-CM | POA: Diagnosis not present

## 2015-06-29 DIAGNOSIS — E7800 Pure hypercholesterolemia, unspecified: Secondary | ICD-10-CM | POA: Diagnosis not present

## 2015-06-29 DIAGNOSIS — Z951 Presence of aortocoronary bypass graft: Secondary | ICD-10-CM | POA: Diagnosis not present

## 2015-06-29 DIAGNOSIS — G20 Parkinson's disease: Secondary | ICD-10-CM | POA: Diagnosis not present

## 2015-06-29 DIAGNOSIS — I2510 Atherosclerotic heart disease of native coronary artery without angina pectoris: Secondary | ICD-10-CM | POA: Diagnosis not present

## 2015-06-29 DIAGNOSIS — R339 Retention of urine, unspecified: Secondary | ICD-10-CM | POA: Diagnosis not present

## 2015-06-29 DIAGNOSIS — I482 Chronic atrial fibrillation: Secondary | ICD-10-CM | POA: Diagnosis not present

## 2015-06-29 DIAGNOSIS — E782 Mixed hyperlipidemia: Secondary | ICD-10-CM | POA: Diagnosis not present

## 2015-07-03 DIAGNOSIS — I10 Essential (primary) hypertension: Secondary | ICD-10-CM | POA: Diagnosis not present

## 2015-07-03 DIAGNOSIS — I2510 Atherosclerotic heart disease of native coronary artery without angina pectoris: Secondary | ICD-10-CM | POA: Diagnosis not present

## 2015-07-03 DIAGNOSIS — N401 Enlarged prostate with lower urinary tract symptoms: Secondary | ICD-10-CM | POA: Diagnosis not present

## 2015-07-03 DIAGNOSIS — Z951 Presence of aortocoronary bypass graft: Secondary | ICD-10-CM | POA: Diagnosis not present

## 2015-07-03 DIAGNOSIS — I482 Chronic atrial fibrillation: Secondary | ICD-10-CM | POA: Diagnosis not present

## 2015-07-03 DIAGNOSIS — R339 Retention of urine, unspecified: Secondary | ICD-10-CM | POA: Diagnosis not present

## 2015-07-03 DIAGNOSIS — G20 Parkinson's disease: Secondary | ICD-10-CM | POA: Diagnosis not present

## 2015-07-05 DIAGNOSIS — R339 Retention of urine, unspecified: Secondary | ICD-10-CM | POA: Diagnosis not present

## 2015-07-05 DIAGNOSIS — N401 Enlarged prostate with lower urinary tract symptoms: Secondary | ICD-10-CM | POA: Diagnosis not present

## 2015-07-05 DIAGNOSIS — I2510 Atherosclerotic heart disease of native coronary artery without angina pectoris: Secondary | ICD-10-CM | POA: Diagnosis not present

## 2015-07-05 DIAGNOSIS — Z951 Presence of aortocoronary bypass graft: Secondary | ICD-10-CM | POA: Diagnosis not present

## 2015-07-05 DIAGNOSIS — I10 Essential (primary) hypertension: Secondary | ICD-10-CM | POA: Diagnosis not present

## 2015-07-05 DIAGNOSIS — G20 Parkinson's disease: Secondary | ICD-10-CM | POA: Diagnosis not present

## 2015-07-05 DIAGNOSIS — I482 Chronic atrial fibrillation: Secondary | ICD-10-CM | POA: Diagnosis not present

## 2015-07-09 DIAGNOSIS — R339 Retention of urine, unspecified: Secondary | ICD-10-CM | POA: Diagnosis not present

## 2015-07-09 DIAGNOSIS — Z951 Presence of aortocoronary bypass graft: Secondary | ICD-10-CM | POA: Diagnosis not present

## 2015-07-09 DIAGNOSIS — I2510 Atherosclerotic heart disease of native coronary artery without angina pectoris: Secondary | ICD-10-CM | POA: Diagnosis not present

## 2015-07-09 DIAGNOSIS — I10 Essential (primary) hypertension: Secondary | ICD-10-CM | POA: Diagnosis not present

## 2015-07-09 DIAGNOSIS — I482 Chronic atrial fibrillation: Secondary | ICD-10-CM | POA: Diagnosis not present

## 2015-07-09 DIAGNOSIS — G20 Parkinson's disease: Secondary | ICD-10-CM | POA: Diagnosis not present

## 2015-07-09 DIAGNOSIS — N401 Enlarged prostate with lower urinary tract symptoms: Secondary | ICD-10-CM | POA: Diagnosis not present

## 2015-07-12 DIAGNOSIS — Z951 Presence of aortocoronary bypass graft: Secondary | ICD-10-CM | POA: Diagnosis not present

## 2015-07-12 DIAGNOSIS — G20 Parkinson's disease: Secondary | ICD-10-CM | POA: Diagnosis not present

## 2015-07-12 DIAGNOSIS — R339 Retention of urine, unspecified: Secondary | ICD-10-CM | POA: Diagnosis not present

## 2015-07-12 DIAGNOSIS — N401 Enlarged prostate with lower urinary tract symptoms: Secondary | ICD-10-CM | POA: Diagnosis not present

## 2015-07-12 DIAGNOSIS — I10 Essential (primary) hypertension: Secondary | ICD-10-CM | POA: Diagnosis not present

## 2015-07-12 DIAGNOSIS — I2510 Atherosclerotic heart disease of native coronary artery without angina pectoris: Secondary | ICD-10-CM | POA: Diagnosis not present

## 2015-07-12 DIAGNOSIS — I482 Chronic atrial fibrillation: Secondary | ICD-10-CM | POA: Diagnosis not present

## 2015-07-17 DIAGNOSIS — I10 Essential (primary) hypertension: Secondary | ICD-10-CM | POA: Diagnosis not present

## 2015-07-17 DIAGNOSIS — N401 Enlarged prostate with lower urinary tract symptoms: Secondary | ICD-10-CM | POA: Diagnosis not present

## 2015-07-17 DIAGNOSIS — I2510 Atherosclerotic heart disease of native coronary artery without angina pectoris: Secondary | ICD-10-CM | POA: Diagnosis not present

## 2015-07-17 DIAGNOSIS — G20 Parkinson's disease: Secondary | ICD-10-CM | POA: Diagnosis not present

## 2015-07-17 DIAGNOSIS — I482 Chronic atrial fibrillation: Secondary | ICD-10-CM | POA: Diagnosis not present

## 2015-07-17 DIAGNOSIS — R339 Retention of urine, unspecified: Secondary | ICD-10-CM | POA: Diagnosis not present

## 2015-07-17 DIAGNOSIS — Z951 Presence of aortocoronary bypass graft: Secondary | ICD-10-CM | POA: Diagnosis not present

## 2015-07-20 ENCOUNTER — Telehealth: Payer: Self-pay | Admitting: *Deleted

## 2015-07-20 DIAGNOSIS — N401 Enlarged prostate with lower urinary tract symptoms: Secondary | ICD-10-CM | POA: Diagnosis not present

## 2015-07-20 DIAGNOSIS — I10 Essential (primary) hypertension: Secondary | ICD-10-CM | POA: Diagnosis not present

## 2015-07-20 DIAGNOSIS — R339 Retention of urine, unspecified: Secondary | ICD-10-CM | POA: Diagnosis not present

## 2015-07-20 DIAGNOSIS — Z951 Presence of aortocoronary bypass graft: Secondary | ICD-10-CM | POA: Diagnosis not present

## 2015-07-20 DIAGNOSIS — I2510 Atherosclerotic heart disease of native coronary artery without angina pectoris: Secondary | ICD-10-CM | POA: Diagnosis not present

## 2015-07-20 DIAGNOSIS — G20 Parkinson's disease: Secondary | ICD-10-CM | POA: Diagnosis not present

## 2015-07-20 DIAGNOSIS — I482 Chronic atrial fibrillation: Secondary | ICD-10-CM | POA: Diagnosis not present

## 2015-07-20 NOTE — Telephone Encounter (Signed)
[facility] has requested to have physical therapy extended 2 times a week for 4 weeks.  Contact  [facility]  [phone]

## 2015-07-20 NOTE — Telephone Encounter (Signed)
Looks like her may have had PT post hospital stay, okay to give orders, thanks

## 2015-07-20 NOTE — Telephone Encounter (Signed)
If we have been giving the home health orders then ok to extend.

## 2015-07-20 NOTE — Telephone Encounter (Signed)
A verbal order was given to [place] at [facility]

## 2015-07-20 NOTE — Telephone Encounter (Signed)
If you are okay with this i will call and have it faxed over.

## 2015-07-24 DIAGNOSIS — R339 Retention of urine, unspecified: Secondary | ICD-10-CM | POA: Diagnosis not present

## 2015-07-24 DIAGNOSIS — G20 Parkinson's disease: Secondary | ICD-10-CM | POA: Diagnosis not present

## 2015-07-24 DIAGNOSIS — I482 Chronic atrial fibrillation: Secondary | ICD-10-CM | POA: Diagnosis not present

## 2015-07-24 DIAGNOSIS — I2510 Atherosclerotic heart disease of native coronary artery without angina pectoris: Secondary | ICD-10-CM | POA: Diagnosis not present

## 2015-07-24 DIAGNOSIS — N401 Enlarged prostate with lower urinary tract symptoms: Secondary | ICD-10-CM | POA: Diagnosis not present

## 2015-07-24 DIAGNOSIS — I10 Essential (primary) hypertension: Secondary | ICD-10-CM | POA: Diagnosis not present

## 2015-07-24 DIAGNOSIS — Z951 Presence of aortocoronary bypass graft: Secondary | ICD-10-CM | POA: Diagnosis not present

## 2015-07-26 DIAGNOSIS — Z951 Presence of aortocoronary bypass graft: Secondary | ICD-10-CM | POA: Diagnosis not present

## 2015-07-26 DIAGNOSIS — I10 Essential (primary) hypertension: Secondary | ICD-10-CM | POA: Diagnosis not present

## 2015-07-26 DIAGNOSIS — N401 Enlarged prostate with lower urinary tract symptoms: Secondary | ICD-10-CM | POA: Diagnosis not present

## 2015-07-26 DIAGNOSIS — R339 Retention of urine, unspecified: Secondary | ICD-10-CM | POA: Diagnosis not present

## 2015-07-26 DIAGNOSIS — I482 Chronic atrial fibrillation: Secondary | ICD-10-CM | POA: Diagnosis not present

## 2015-07-26 DIAGNOSIS — I2510 Atherosclerotic heart disease of native coronary artery without angina pectoris: Secondary | ICD-10-CM | POA: Diagnosis not present

## 2015-07-26 DIAGNOSIS — G20 Parkinson's disease: Secondary | ICD-10-CM | POA: Diagnosis not present

## 2015-07-30 DIAGNOSIS — I10 Essential (primary) hypertension: Secondary | ICD-10-CM | POA: Diagnosis not present

## 2015-07-30 DIAGNOSIS — G20 Parkinson's disease: Secondary | ICD-10-CM | POA: Diagnosis not present

## 2015-07-30 DIAGNOSIS — N401 Enlarged prostate with lower urinary tract symptoms: Secondary | ICD-10-CM | POA: Diagnosis not present

## 2015-07-30 DIAGNOSIS — I482 Chronic atrial fibrillation: Secondary | ICD-10-CM | POA: Diagnosis not present

## 2015-07-30 DIAGNOSIS — Z951 Presence of aortocoronary bypass graft: Secondary | ICD-10-CM | POA: Diagnosis not present

## 2015-07-30 DIAGNOSIS — R339 Retention of urine, unspecified: Secondary | ICD-10-CM | POA: Diagnosis not present

## 2015-07-30 DIAGNOSIS — I2510 Atherosclerotic heart disease of native coronary artery without angina pectoris: Secondary | ICD-10-CM | POA: Diagnosis not present

## 2015-08-02 DIAGNOSIS — Z951 Presence of aortocoronary bypass graft: Secondary | ICD-10-CM | POA: Diagnosis not present

## 2015-08-02 DIAGNOSIS — I10 Essential (primary) hypertension: Secondary | ICD-10-CM | POA: Diagnosis not present

## 2015-08-02 DIAGNOSIS — R339 Retention of urine, unspecified: Secondary | ICD-10-CM | POA: Diagnosis not present

## 2015-08-02 DIAGNOSIS — G20 Parkinson's disease: Secondary | ICD-10-CM | POA: Diagnosis not present

## 2015-08-02 DIAGNOSIS — N401 Enlarged prostate with lower urinary tract symptoms: Secondary | ICD-10-CM | POA: Diagnosis not present

## 2015-08-02 DIAGNOSIS — I482 Chronic atrial fibrillation: Secondary | ICD-10-CM | POA: Diagnosis not present

## 2015-08-02 DIAGNOSIS — I2510 Atherosclerotic heart disease of native coronary artery without angina pectoris: Secondary | ICD-10-CM | POA: Diagnosis not present

## 2015-08-07 DIAGNOSIS — I10 Essential (primary) hypertension: Secondary | ICD-10-CM | POA: Diagnosis not present

## 2015-08-07 DIAGNOSIS — I2510 Atherosclerotic heart disease of native coronary artery without angina pectoris: Secondary | ICD-10-CM | POA: Diagnosis not present

## 2015-08-07 DIAGNOSIS — R339 Retention of urine, unspecified: Secondary | ICD-10-CM | POA: Diagnosis not present

## 2015-08-07 DIAGNOSIS — Z951 Presence of aortocoronary bypass graft: Secondary | ICD-10-CM | POA: Diagnosis not present

## 2015-08-07 DIAGNOSIS — I482 Chronic atrial fibrillation: Secondary | ICD-10-CM | POA: Diagnosis not present

## 2015-08-07 DIAGNOSIS — G20 Parkinson's disease: Secondary | ICD-10-CM | POA: Diagnosis not present

## 2015-08-07 DIAGNOSIS — N401 Enlarged prostate with lower urinary tract symptoms: Secondary | ICD-10-CM | POA: Diagnosis not present

## 2015-08-09 DIAGNOSIS — I482 Chronic atrial fibrillation: Secondary | ICD-10-CM | POA: Diagnosis not present

## 2015-08-09 DIAGNOSIS — R339 Retention of urine, unspecified: Secondary | ICD-10-CM | POA: Diagnosis not present

## 2015-08-09 DIAGNOSIS — I2510 Atherosclerotic heart disease of native coronary artery without angina pectoris: Secondary | ICD-10-CM | POA: Diagnosis not present

## 2015-08-09 DIAGNOSIS — N401 Enlarged prostate with lower urinary tract symptoms: Secondary | ICD-10-CM | POA: Diagnosis not present

## 2015-08-09 DIAGNOSIS — I10 Essential (primary) hypertension: Secondary | ICD-10-CM | POA: Diagnosis not present

## 2015-08-09 DIAGNOSIS — G20 Parkinson's disease: Secondary | ICD-10-CM | POA: Diagnosis not present

## 2015-08-09 DIAGNOSIS — Z951 Presence of aortocoronary bypass graft: Secondary | ICD-10-CM | POA: Diagnosis not present

## 2015-08-14 DIAGNOSIS — N401 Enlarged prostate with lower urinary tract symptoms: Secondary | ICD-10-CM | POA: Diagnosis not present

## 2015-08-14 DIAGNOSIS — I482 Chronic atrial fibrillation: Secondary | ICD-10-CM | POA: Diagnosis not present

## 2015-08-14 DIAGNOSIS — R339 Retention of urine, unspecified: Secondary | ICD-10-CM | POA: Diagnosis not present

## 2015-08-14 DIAGNOSIS — Z951 Presence of aortocoronary bypass graft: Secondary | ICD-10-CM | POA: Diagnosis not present

## 2015-08-14 DIAGNOSIS — I10 Essential (primary) hypertension: Secondary | ICD-10-CM | POA: Diagnosis not present

## 2015-08-14 DIAGNOSIS — I2510 Atherosclerotic heart disease of native coronary artery without angina pectoris: Secondary | ICD-10-CM | POA: Diagnosis not present

## 2015-08-14 DIAGNOSIS — G20 Parkinson's disease: Secondary | ICD-10-CM | POA: Diagnosis not present

## 2015-08-15 ENCOUNTER — Ambulatory Visit (INDEPENDENT_AMBULATORY_CARE_PROVIDER_SITE_OTHER): Payer: Medicare HMO | Admitting: Internal Medicine

## 2015-08-15 ENCOUNTER — Encounter: Payer: Self-pay | Admitting: Internal Medicine

## 2015-08-15 VITALS — BP 128/70 | HR 64 | Temp 97.9°F | Resp 18 | Ht 66.0 in | Wt 144.1 lb

## 2015-08-15 DIAGNOSIS — K922 Gastrointestinal hemorrhage, unspecified: Secondary | ICD-10-CM | POA: Diagnosis not present

## 2015-08-15 DIAGNOSIS — I2510 Atherosclerotic heart disease of native coronary artery without angina pectoris: Secondary | ICD-10-CM | POA: Diagnosis not present

## 2015-08-15 DIAGNOSIS — I10 Essential (primary) hypertension: Secondary | ICD-10-CM

## 2015-08-15 DIAGNOSIS — E7800 Pure hypercholesterolemia, unspecified: Secondary | ICD-10-CM

## 2015-08-15 DIAGNOSIS — D508 Other iron deficiency anemias: Secondary | ICD-10-CM | POA: Diagnosis not present

## 2015-08-15 DIAGNOSIS — I4891 Unspecified atrial fibrillation: Secondary | ICD-10-CM | POA: Diagnosis not present

## 2015-08-15 DIAGNOSIS — R42 Dizziness and giddiness: Secondary | ICD-10-CM

## 2015-08-15 LAB — BASIC METABOLIC PANEL
BUN: 14 mg/dL (ref 6–23)
CALCIUM: 9.2 mg/dL (ref 8.4–10.5)
CO2: 28 mEq/L (ref 19–32)
Chloride: 99 mEq/L (ref 96–112)
Creatinine, Ser: 0.89 mg/dL (ref 0.40–1.50)
GFR: 85.72 mL/min (ref >60.00)
GLUCOSE: 113 mg/dL — AB (ref 70–99)
Potassium: 4.2 mEq/L (ref 3.5–5.1)
SODIUM: 136 meq/L (ref 135–145)

## 2015-08-15 LAB — CBC WITH DIFFERENTIAL/PLATELET
BASOS PCT: 0.4 % (ref 0.0–3.0)
Basophils Absolute: 0 10*3/uL (ref 0.0–0.1)
EOS ABS: 0.1 10*3/uL (ref 0.0–0.7)
Eosinophils Relative: 1.8 % (ref 0.0–5.0)
HEMATOCRIT: 40.4 % (ref 39.0–52.0)
Hemoglobin: 13.3 g/dL (ref 13.0–17.0)
LYMPHS ABS: 1.4 10*3/uL (ref 0.7–4.0)
LYMPHS PCT: 23.2 % (ref 12.0–46.0)
MCHC: 32.8 g/dL (ref 30.0–36.0)
MCV: 91.4 fl (ref 78.0–100.0)
Monocytes Absolute: 0.6 10*3/uL (ref 0.1–1.0)
Monocytes Relative: 9.4 % (ref 3.0–12.0)
NEUTROS ABS: 4 10*3/uL (ref 1.4–7.7)
NEUTROS PCT: 65.2 % (ref 43.0–77.0)
PLATELETS: 187 10*3/uL (ref 150.0–400.0)
RBC: 4.42 Mil/uL (ref 4.22–5.81)
RDW: 15.4 % (ref 11.5–15.5)
WBC: 6.2 10*3/uL (ref 4.0–10.5)

## 2015-08-15 LAB — FERRITIN: Ferritin: 39 ng/mL (ref 22.0–322.0)

## 2015-08-15 NOTE — Progress Notes (Signed)
Pre-visit discussion using our clinic review tool. No additional management support is needed unless otherwise documented below in the visit note.  

## 2015-08-15 NOTE — Progress Notes (Signed)
[name]: Tyler Stout, male   DOB: 03/09/1925, 82 y.o.   MRN: [id number]   Subjective:    [name]: Tyler Stout, male    DOB: Nov 01, 1925, 82 y.o.   MRN: [id number]  HPI  Patient here for a scheduled follow up.  He is accompanied by his wife.  History obtained from both of them.  States he is doing better.  Energy has improved some.  No further bleeding.  No abdominal pain or cramping.  Breathing stable.  No chest pain.  Blood pressures overall doing well.  See attached list.  Mostly averaging 130-140/60-70s.     Past Medical History:  Diagnosis Date  . Anemia    iron deficiency  . Arthritis   . Atrial fibrillation ([place])   . Bilateral renal cysts    worked up by Dr Bernardo Heater  . BPH (benign prostatic hypertrophy)   . CAD (coronary artery disease)    s/p bypass graft x 4 (1991)  . Diverticulosis   . GERD (gastroesophageal reflux disease)   . Glaucoma   . Hypercholesterolemia   . Hypertension   . Pancreatitis    x2 (1994 and 1998), presumed to be gallstone pancreatitis.  s/p ERCP and sphincterotomy   Past Surgical History:  Procedure Laterality Date  . APPENDECTOMY    . CHOLECYSTECTOMY  1994  . CORONARY ARTERY BYPASS GRAFT     x 4,  1991  . INGUINAL HERNIA REPAIR  2009  . open heart surgery  1991   Family History  Problem Relation Age of Onset  . Throat cancer Father     oropharyngeal cancer  . Kidney disease Sister   . Heart disease    . Colon cancer Neg Hx   . Prostate cancer Neg Hx    Social History   Social History  . Marital status: Married    Spouse name: N/A  . Number of children: N/A  . Years of education: N/A   Social History Main Topics  . Smoking status: Former Smoker    Quit date: 01/14/1964  . Smokeless tobacco: Never Used  . Alcohol use No  . Drug use: No  . Sexual activity: Not Asked   Other Topics Concern  . None   Social History Narrative  . None    Outpatient Encounter Prescriptions as of 08/15/2015  Medication Sig  . Ascorbic  Acid (VITAMIN C PO) Take 1 tablet by mouth daily. Reported on 06/20/2015  . atorvastatin (LIPITOR) 20 MG tablet Take 20 mg by mouth at bedtime.  . Calcium Carbonate-Vitamin D (CALCIUM 600+D) 600-400 MG-UNIT per tablet Take 1 tablet by mouth daily. Reported on 06/20/2015  . carbidopa-levodopa (SINEMET IR) 25-100 MG tablet Take 1 tablet by mouth 3 (three) times daily. Reported on 06/20/2015  . citalopram (CELEXA) 20 MG tablet Take 20 mg by mouth daily.  Marland Kitchen HYDROcodone-acetaminophen (NORCO/VICODIN) 5-325 MG tablet Take 1 tablet by mouth every 6 (six) hours as needed for moderate pain. Reported on 06/09/2015  . latanoprost (XALATAN) 0.005 % ophthalmic solution Place 1 drop into both eyes at bedtime.   Marland Kitchen MAGNESIUM PO Take 1 tablet by mouth daily. Reported on 06/20/2015  . metoprolol tartrate (LOPRESSOR) 25 MG tablet Take 25 mg by mouth 2 (two) times daily.  . Multiple Vitamin (MULTIVITAMIN WITH MINERALS) TABS tablet Take 1 tablet by mouth daily.  Marland Kitchen omega-3 acid ethyl esters (LOVAZA) 1 g capsule Take 1 g by mouth daily. Reported on 06/20/2015  . omeprazole (PRILOSEC) 20 MG capsule  Take 20 mg by mouth daily.  . tamsulosin (FLOMAX) 0.4 MG CAPS capsule Take 0.4 mg by mouth daily after supper.    No facility-administered encounter medications on file as of 08/15/2015.     Review of Systems  Constitutional: Negative for appetite change and unexpected weight change.       Energy improved some.   HENT: Negative for congestion and sinus pressure.   Respiratory: Negative for cough and chest tightness.        Breathing stable.   Cardiovascular: Negative for chest pain, palpitations and leg swelling.  Gastrointestinal: Negative for abdominal pain, diarrhea, nausea and vomiting.  Genitourinary: Negative for difficulty urinating and dysuria.  Musculoskeletal: Negative for joint swelling and myalgias.  Skin: Negative for color change and rash.  Neurological: Negative for headaches.       Still some light headedness with  getting up fast.    Psychiatric/Behavioral: Negative for agitation and dysphoric mood.       Objective:     Blood pressure rechecked by me:  124/72  Physical Exam  Constitutional: He appears well-developed and well-nourished. No distress.  HENT:  Nose: Nose normal.  Mouth/Throat: Oropharynx is clear and moist.  Neck: Neck supple. No thyromegaly present.  Cardiovascular: Normal rate and regular rhythm.   Pulmonary/Chest: Effort normal and breath sounds normal. No respiratory distress.  Abdominal: Soft. Bowel sounds are normal. There is no tenderness.  Musculoskeletal: He exhibits no edema or tenderness.  Lymphadenopathy:    He has no cervical adenopathy.  Skin: No rash noted. No erythema.  Psychiatric: He has a normal mood and affect. His behavior is normal.    BP 128/70 (BP Location: Left Arm, Patient Position: Sitting, Cuff Size: Normal)   Pulse 64   Temp 97.9 F (36.6 C) (Oral)   Resp 18   Ht 5\' 6"  (1.676 m)   Wt 144 lb 2 oz (65.4 kg)   SpO2 96%   BMI 23.26 kg/m  Wt Readings from Last 3 Encounters:  08/15/15 144 lb 2 oz (65.4 kg)  06/20/15 142 lb (64.4 kg)  06/12/15 143 lb 1.3 oz (64.9 kg)     Lab Results  Component Value Date   WBC 6.2 08/15/2015   HGB 13.3 08/15/2015   HCT 40.4 08/15/2015   PLT 187.0 08/15/2015   GLUCOSE 113 (H) 08/15/2015   CHOL 100 03/12/2015   TRIG 83.0 03/12/2015   HDL 33.20 (L) 03/12/2015   LDLCALC 50 03/12/2015   ALT 21 06/12/2015   AST 29 06/12/2015   NA 136 08/15/2015   K 4.2 08/15/2015   CL 99 08/15/2015   CREATININE 0.89 08/15/2015   BUN 14 08/15/2015   CO2 28 08/15/2015   TSH 1.99 05/01/2014   INR 1.20 06/14/2015   HGBA1C 6.2 03/15/2012    Ir Angiogram Visceral Selective  Result Date: 06/09/2015 CLINICAL DATA:  Acute lower GI bleed. Positive tagged red blood cell study localizing to the ascending colon. Unstable on pressors. Recent Coumadin use, partially reversed. EXAM: SELECTIVE VISCERAL ARTERIOGRAPHY; IR ULTRASOUND  GUIDANCE VASC ACCESS LEFT ANESTHESIA/SEDATION: none MEDICATIONS: Lidocaine 1% subcutaneous CONTRAST:  135mL OMNIPAQUE IOHEXOL 300 MG/ML SOLN, 46mL ISOVUE-300 IOPAMIDOL (ISOVUE-300) INJECTION 61% PROCEDURE: The procedure, risks (including but not limited to bleeding, infection, organ damage ), benefits, and alternatives were explained to the patient and family. Questions regarding the procedure were encouraged and answered. The patient understands and consents to the procedure. Review of previous CT demonstrated significant atheromatous change in the right iliac arterial system, so  left-sided approach was selected. The left femoral region prepped and draped in usual sterile fashion. Maximal barrier sterile technique was utilized including caps, mask, sterile gowns, sterile gloves, sterile drape, hand hygiene and skin antiseptic. The left common femoral artery was localized under ultrasound, patency confirmed and documented. Under real-time ultrasound guidance, the vessel was accessed with a 21-gauge micropuncture needle, exchanged over a 018 guidewire for a transitional dilator, through which a 035 guidewire was advanced. Over this, a 5 [place] vascular sheath was placed, through which a 5 [place] C2 catheter was advanced and used to selectively catheterize the superior mesenteric artery for selective arteriography in multiple projections. A RIM catheter was then utilized to selectively catheterize the inferior mesenteric artery for selective arteriography in multiple projections. The C2 was then utilized to selectively catheterize the celiac axis for selective arteriography. The catheter was removed. The sheath was secured externally with 0 Prolene suture and placed to saline flush. Site covered with a sterile dressing. The patient tolerated the procedure well. COMPLICATIONS: None immediate FINDINGS: No evidence of active extravasation, early draining vein, AVM, or other lesion to suggest a site or etiology of the  patient's GI bleeding. Venous phase confirms patency of the SMV, IMV and portal venous system. IMPRESSION: 1. Negative three-vessel mesenteric arteriogram. No evidence of active extravasation or other focal lesion to suggest etiology of GI bleed. Critical Value/emergent results were called by telephone at the conclusion of the procedure on 06/09/2015 at 3:50am to Dr. Boone Master , who verbally acknowledged these results. Electronically Signed   By: Lucrezia Europe M.D.   On: 06/09/2015 09:09   Ir Angiogram Visceral Selective  Result Date: 06/09/2015 CLINICAL DATA:  Acute lower GI bleed. Positive tagged red blood cell study localizing to the ascending colon. Unstable on pressors. Recent Coumadin use, partially reversed. EXAM: SELECTIVE VISCERAL ARTERIOGRAPHY; IR ULTRASOUND GUIDANCE VASC ACCESS LEFT ANESTHESIA/SEDATION: none MEDICATIONS: Lidocaine 1% subcutaneous CONTRAST:  176mL OMNIPAQUE IOHEXOL 300 MG/ML SOLN, 58mL ISOVUE-300 IOPAMIDOL (ISOVUE-300) INJECTION 61% PROCEDURE: The procedure, risks (including but not limited to bleeding, infection, organ damage ), benefits, and alternatives were explained to the patient and family. Questions regarding the procedure were encouraged and answered. The patient understands and consents to the procedure. Review of previous CT demonstrated significant atheromatous change in the right iliac arterial system, so left-sided approach was selected. The left femoral region prepped and draped in usual sterile fashion. Maximal barrier sterile technique was utilized including caps, mask, sterile gowns, sterile gloves, sterile drape, hand hygiene and skin antiseptic. The left common femoral artery was localized under ultrasound, patency confirmed and documented. Under real-time ultrasound guidance, the vessel was accessed with a 21-gauge micropuncture needle, exchanged over a 018 guidewire for a transitional dilator, through which a 035 guidewire was advanced. Over this, a 5 [place]  vascular sheath was placed, through which a 5 [place] C2 catheter was advanced and used to selectively catheterize the superior mesenteric artery for selective arteriography in multiple projections. A RIM catheter was then utilized to selectively catheterize the inferior mesenteric artery for selective arteriography in multiple projections. The C2 was then utilized to selectively catheterize the celiac axis for selective arteriography. The catheter was removed. The sheath was secured externally with 0 Prolene suture and placed to saline flush. Site covered with a sterile dressing. The patient tolerated the procedure well. COMPLICATIONS: None immediate FINDINGS: No evidence of active extravasation, early draining vein, AVM, or other lesion to suggest a site or etiology of the patient's GI bleeding. Venous phase confirms  patency of the SMV, IMV and portal venous system. IMPRESSION: 1. Negative three-vessel mesenteric arteriogram. No evidence of active extravasation or other focal lesion to suggest etiology of GI bleed. Critical Value/emergent results were called by telephone at the conclusion of the procedure on 06/09/2015 at 3:50am to Dr. Boone Master , who verbally acknowledged these results. Electronically Signed   By: Lucrezia Europe M.D.   On: 06/09/2015 09:09   Ir Angiogram Visceral Selective  Result Date: 06/09/2015 CLINICAL DATA:  Acute lower GI bleed. Positive tagged red blood cell study localizing to the ascending colon. Unstable on pressors. Recent Coumadin use, partially reversed. EXAM: SELECTIVE VISCERAL ARTERIOGRAPHY; IR ULTRASOUND GUIDANCE VASC ACCESS LEFT ANESTHESIA/SEDATION: none MEDICATIONS: Lidocaine 1% subcutaneous CONTRAST:  168mL OMNIPAQUE IOHEXOL 300 MG/ML SOLN, 86mL ISOVUE-300 IOPAMIDOL (ISOVUE-300) INJECTION 61% PROCEDURE: The procedure, risks (including but not limited to bleeding, infection, organ damage ), benefits, and alternatives were explained to the patient and family. Questions  regarding the procedure were encouraged and answered. The patient understands and consents to the procedure. Review of previous CT demonstrated significant atheromatous change in the right iliac arterial system, so left-sided approach was selected. The left femoral region prepped and draped in usual sterile fashion. Maximal barrier sterile technique was utilized including caps, mask, sterile gowns, sterile gloves, sterile drape, hand hygiene and skin antiseptic. The left common femoral artery was localized under ultrasound, patency confirmed and documented. Under real-time ultrasound guidance, the vessel was accessed with a 21-gauge micropuncture needle, exchanged over a 018 guidewire for a transitional dilator, through which a 035 guidewire was advanced. Over this, a 5 [place] vascular sheath was placed, through which a 5 [place] C2 catheter was advanced and used to selectively catheterize the superior mesenteric artery for selective arteriography in multiple projections. A RIM catheter was then utilized to selectively catheterize the inferior mesenteric artery for selective arteriography in multiple projections. The C2 was then utilized to selectively catheterize the celiac axis for selective arteriography. The catheter was removed. The sheath was secured externally with 0 Prolene suture and placed to saline flush. Site covered with a sterile dressing. The patient tolerated the procedure well. COMPLICATIONS: None immediate FINDINGS: No evidence of active extravasation, early draining vein, AVM, or other lesion to suggest a site or etiology of the patient's GI bleeding. Venous phase confirms patency of the SMV, IMV and portal venous system. IMPRESSION: 1. Negative three-vessel mesenteric arteriogram. No evidence of active extravasation or other focal lesion to suggest etiology of GI bleed. Critical Value/emergent results were called by telephone at the conclusion of the procedure on 06/09/2015 at 3:50am to Dr. Boone Master , who verbally acknowledged these results. Electronically Signed   By: Lucrezia Europe M.D.   On: 06/09/2015 09:09   Ir US Guide Vasc Access Left  Result Date: 06/09/2015 CLINICAL DATA:  Acute lower GI bleed. Positive tagged red blood cell study localizing to the ascending colon. Unstable on pressors. Recent Coumadin use, partially reversed. EXAM: SELECTIVE VISCERAL ARTERIOGRAPHY; IR ULTRASOUND GUIDANCE VASC ACCESS LEFT ANESTHESIA/SEDATION: none MEDICATIONS: Lidocaine 1% subcutaneous CONTRAST:  171mL OMNIPAQUE IOHEXOL 300 MG/ML SOLN, 40mL ISOVUE-300 IOPAMIDOL (ISOVUE-300) INJECTION 61% PROCEDURE: The procedure, risks (including but not limited to bleeding, infection, organ damage ), benefits, and alternatives were explained to the patient and family. Questions regarding the procedure were encouraged and answered. The patient understands and consents to the procedure. Review of previous CT demonstrated significant atheromatous change in the right iliac arterial system, so left-sided approach was selected. The left femoral region prepped  and draped in usual sterile fashion. Maximal barrier sterile technique was utilized including caps, mask, sterile gowns, sterile gloves, sterile drape, hand hygiene and skin antiseptic. The left common femoral artery was localized under ultrasound, patency confirmed and documented. Under real-time ultrasound guidance, the vessel was accessed with a 21-gauge micropuncture needle, exchanged over a 018 guidewire for a transitional dilator, through which a 035 guidewire was advanced. Over this, a 5 [place] vascular sheath was placed, through which a 5 [place] C2 catheter was advanced and used to selectively catheterize the superior mesenteric artery for selective arteriography in multiple projections. A RIM catheter was then utilized to selectively catheterize the inferior mesenteric artery for selective arteriography in multiple projections. The C2 was then utilized to selectively  catheterize the celiac axis for selective arteriography. The catheter was removed. The sheath was secured externally with 0 Prolene suture and placed to saline flush. Site covered with a sterile dressing. The patient tolerated the procedure well. COMPLICATIONS: None immediate FINDINGS: No evidence of active extravasation, early draining vein, AVM, or other lesion to suggest a site or etiology of the patient's GI bleeding. Venous phase confirms patency of the SMV, IMV and portal venous system. IMPRESSION: 1. Negative three-vessel mesenteric arteriogram. No evidence of active extravasation or other focal lesion to suggest etiology of GI bleed. Critical Value/emergent results were called by telephone at the conclusion of the procedure on 06/09/2015 at 3:50am to Dr. Boone Master , who verbally acknowledged these results. Electronically Signed   By: Lucrezia Europe M.D.   On: 06/09/2015 09:09       Assessment & Plan:   Problem List Items Addressed This Visit    Anemia - Primary    Recent admission for GI bleed.  Required transfusion.  Last check improved.  Recheck cbc today.        Relevant Orders   CBC with Differential/Platelet (Completed)   Ferritin (Completed)   Atrial fibrillation ([facility])    Previously on coumadin.  Off now secondary to GI bleed.  Dr Ubaldo Glassing following.  Restarting - to be determined at next cardiology visit.        CAD (coronary artery disease)    Followed by cardiology.  Stable.       GI bleed    No further bleeding.  Recheck cbc.       Hypercholesteremia    On lipitor.  Low cholesterol diet and exercise.  Follow lipid panel and liver function tests.        Hypertension    Blood pressure under good control.  Continue same medication regimen.  Follow pressures.  Follow metabolic panel.        Relevant Orders   Basic metabolic panel (Completed)   Light headedness    Persistent intermittent episodes.  Brief.  Sometimes positional.  Followed by cardiology.  Overall feels  stable.         Other Visit Diagnoses   None.      [name], MD

## 2015-08-16 DIAGNOSIS — I482 Chronic atrial fibrillation: Secondary | ICD-10-CM | POA: Diagnosis not present

## 2015-08-16 DIAGNOSIS — I10 Essential (primary) hypertension: Secondary | ICD-10-CM | POA: Diagnosis not present

## 2015-08-16 DIAGNOSIS — Z951 Presence of aortocoronary bypass graft: Secondary | ICD-10-CM | POA: Diagnosis not present

## 2015-08-16 DIAGNOSIS — I2510 Atherosclerotic heart disease of native coronary artery without angina pectoris: Secondary | ICD-10-CM | POA: Diagnosis not present

## 2015-08-16 DIAGNOSIS — G20 Parkinson's disease: Secondary | ICD-10-CM | POA: Diagnosis not present

## 2015-08-16 DIAGNOSIS — N401 Enlarged prostate with lower urinary tract symptoms: Secondary | ICD-10-CM | POA: Diagnosis not present

## 2015-08-16 DIAGNOSIS — R339 Retention of urine, unspecified: Secondary | ICD-10-CM | POA: Diagnosis not present

## 2015-08-19 ENCOUNTER — Encounter: Payer: Self-pay | Admitting: Internal Medicine

## 2015-08-19 NOTE — Assessment & Plan Note (Signed)
Persistent intermittent episodes.  Brief.  Sometimes positional.  Followed by cardiology.  Overall feels stable.

## 2015-08-19 NOTE — Assessment & Plan Note (Signed)
Blood pressure under good control.  Continue same medication regimen.  Follow pressures.  Follow metabolic panel.   

## 2015-08-19 NOTE — Assessment & Plan Note (Signed)
No further bleeding.  Recheck cbc.

## 2015-08-19 NOTE — Assessment & Plan Note (Signed)
Recent admission for GI bleed.  Required transfusion.  Last check improved.  Recheck cbc today.

## 2015-08-19 NOTE — Assessment & Plan Note (Signed)
Followed by cardiology. Stable.   

## 2015-08-19 NOTE — Assessment & Plan Note (Signed)
Previously on coumadin.  Off now secondary to GI bleed.  Dr Ubaldo Glassing following.  Restarting - to be determined at next cardiology visit.

## 2015-08-19 NOTE — Assessment & Plan Note (Signed)
On lipitor.  Low cholesterol diet and exercise.  Follow lipid panel and liver function tests.   

## 2015-08-27 DIAGNOSIS — H402232 Chronic angle-closure glaucoma, bilateral, moderate stage: Secondary | ICD-10-CM | POA: Diagnosis not present

## 2015-08-29 DIAGNOSIS — L570 Actinic keratosis: Secondary | ICD-10-CM | POA: Diagnosis not present

## 2015-08-29 DIAGNOSIS — L821 Other seborrheic keratosis: Secondary | ICD-10-CM | POA: Diagnosis not present

## 2015-09-04 DIAGNOSIS — I4892 Unspecified atrial flutter: Secondary | ICD-10-CM | POA: Diagnosis not present

## 2015-09-04 DIAGNOSIS — I4891 Unspecified atrial fibrillation: Secondary | ICD-10-CM | POA: Diagnosis not present

## 2015-09-04 DIAGNOSIS — K219 Gastro-esophageal reflux disease without esophagitis: Secondary | ICD-10-CM | POA: Diagnosis not present

## 2015-09-04 DIAGNOSIS — Z0000 Encounter for general adult medical examination without abnormal findings: Secondary | ICD-10-CM | POA: Diagnosis not present

## 2015-09-04 DIAGNOSIS — R69 Illness, unspecified: Secondary | ICD-10-CM | POA: Diagnosis not present

## 2015-09-04 DIAGNOSIS — M461 Sacroiliitis, not elsewhere classified: Secondary | ICD-10-CM | POA: Diagnosis not present

## 2015-09-04 DIAGNOSIS — I2510 Atherosclerotic heart disease of native coronary artery without angina pectoris: Secondary | ICD-10-CM | POA: Diagnosis not present

## 2015-09-05 DIAGNOSIS — H402232 Chronic angle-closure glaucoma, bilateral, moderate stage: Secondary | ICD-10-CM | POA: Diagnosis not present

## 2015-09-26 DIAGNOSIS — E7800 Pure hypercholesterolemia, unspecified: Secondary | ICD-10-CM | POA: Diagnosis not present

## 2015-09-26 DIAGNOSIS — R0989 Other specified symptoms and signs involving the circulatory and respiratory systems: Secondary | ICD-10-CM | POA: Diagnosis not present

## 2015-09-26 DIAGNOSIS — R0602 Shortness of breath: Secondary | ICD-10-CM | POA: Diagnosis not present

## 2015-09-26 DIAGNOSIS — R42 Dizziness and giddiness: Secondary | ICD-10-CM | POA: Diagnosis not present

## 2015-09-26 DIAGNOSIS — R251 Tremor, unspecified: Secondary | ICD-10-CM | POA: Diagnosis not present

## 2015-09-26 DIAGNOSIS — I480 Paroxysmal atrial fibrillation: Secondary | ICD-10-CM | POA: Diagnosis not present

## 2015-09-26 DIAGNOSIS — E782 Mixed hyperlipidemia: Secondary | ICD-10-CM | POA: Diagnosis not present

## 2015-09-26 DIAGNOSIS — I951 Orthostatic hypotension: Secondary | ICD-10-CM | POA: Diagnosis not present

## 2015-09-26 DIAGNOSIS — I2510 Atherosclerotic heart disease of native coronary artery without angina pectoris: Secondary | ICD-10-CM | POA: Diagnosis not present

## 2015-09-26 DIAGNOSIS — R262 Difficulty in walking, not elsewhere classified: Secondary | ICD-10-CM | POA: Diagnosis not present

## 2015-10-05 ENCOUNTER — Other Ambulatory Visit: Payer: Self-pay | Admitting: Internal Medicine

## 2015-10-09 DIAGNOSIS — Z23 Encounter for immunization: Secondary | ICD-10-CM | POA: Diagnosis not present

## 2015-10-19 DIAGNOSIS — R42 Dizziness and giddiness: Secondary | ICD-10-CM | POA: Diagnosis not present

## 2015-10-19 DIAGNOSIS — R0989 Other specified symptoms and signs involving the circulatory and respiratory systems: Secondary | ICD-10-CM | POA: Diagnosis not present

## 2015-10-19 DIAGNOSIS — I6523 Occlusion and stenosis of bilateral carotid arteries: Secondary | ICD-10-CM | POA: Diagnosis not present

## 2015-10-19 DIAGNOSIS — R0602 Shortness of breath: Secondary | ICD-10-CM | POA: Diagnosis not present

## 2015-11-01 DIAGNOSIS — E782 Mixed hyperlipidemia: Secondary | ICD-10-CM | POA: Diagnosis not present

## 2015-11-01 DIAGNOSIS — I480 Paroxysmal atrial fibrillation: Secondary | ICD-10-CM | POA: Diagnosis not present

## 2015-11-01 DIAGNOSIS — I951 Orthostatic hypotension: Secondary | ICD-10-CM | POA: Diagnosis not present

## 2015-11-01 DIAGNOSIS — I2510 Atherosclerotic heart disease of native coronary artery without angina pectoris: Secondary | ICD-10-CM | POA: Diagnosis not present

## 2015-11-09 ENCOUNTER — Other Ambulatory Visit: Payer: Self-pay | Admitting: Internal Medicine

## 2015-11-14 DIAGNOSIS — C4440 Unspecified malignant neoplasm of skin of scalp and neck: Secondary | ICD-10-CM | POA: Diagnosis not present

## 2015-11-14 DIAGNOSIS — C44311 Basal cell carcinoma of skin of nose: Secondary | ICD-10-CM | POA: Diagnosis not present

## 2015-11-14 DIAGNOSIS — D485 Neoplasm of uncertain behavior of skin: Secondary | ICD-10-CM | POA: Diagnosis not present

## 2015-11-14 DIAGNOSIS — C4441 Basal cell carcinoma of skin of scalp and neck: Secondary | ICD-10-CM | POA: Diagnosis not present

## 2015-11-14 DIAGNOSIS — L570 Actinic keratosis: Secondary | ICD-10-CM | POA: Diagnosis not present

## 2015-11-21 ENCOUNTER — Encounter: Payer: Self-pay | Admitting: Internal Medicine

## 2015-11-21 ENCOUNTER — Ambulatory Visit (INDEPENDENT_AMBULATORY_CARE_PROVIDER_SITE_OTHER): Payer: Medicare HMO | Admitting: Internal Medicine

## 2015-11-21 DIAGNOSIS — R42 Dizziness and giddiness: Secondary | ICD-10-CM

## 2015-11-21 DIAGNOSIS — K922 Gastrointestinal hemorrhage, unspecified: Secondary | ICD-10-CM

## 2015-11-21 DIAGNOSIS — E7800 Pure hypercholesterolemia, unspecified: Secondary | ICD-10-CM | POA: Diagnosis not present

## 2015-11-21 DIAGNOSIS — I482 Chronic atrial fibrillation, unspecified: Secondary | ICD-10-CM

## 2015-11-21 DIAGNOSIS — I2510 Atherosclerotic heart disease of native coronary artery without angina pectoris: Secondary | ICD-10-CM

## 2015-11-21 DIAGNOSIS — I10 Essential (primary) hypertension: Secondary | ICD-10-CM | POA: Diagnosis not present

## 2015-11-21 MED ORDER — CITALOPRAM HYDROBROMIDE 20 MG PO TABS: 20.0000 mg | ORAL_TABLET | Freq: Every day | ORAL | 1 refills | Status: DC

## 2015-11-21 MED ORDER — PANTOPRAZOLE SODIUM 40 MG PO TBEC: 40.0000 mg | DELAYED_RELEASE_TABLET | Freq: Every day | ORAL | 3 refills | Status: DC

## 2015-11-21 MED ORDER — AMLODIPINE BESYLATE 5 MG PO TABS: 5.0000 mg | ORAL_TABLET | Freq: Every day | ORAL | 1 refills | Status: DC

## 2015-11-21 NOTE — Progress Notes (Signed)
[name]: Tyler Stout, male   DOB: August 22, 1925, 82 y.o.   MRN: [id number]   Subjective:    [name]: Tyler Stout, male    DOB: 12-07-1925, 82 y.o.   MRN: [id number]  HPI  Patient here for a scheduled follow up.  He is accompanied by his wife.  History obtained from both of them.  His main complaint is that of persistent dizziness and a feeling or a pressure sensation in his head.  Has had issues with dizziness for a while now.  Saw cardiology recently.  Refer to Dr Bethanne Ginger note for details.  Had echo and carotid ultrasound.  EF 45-50%.  Carotid with <70% stenosis.  Dr Ubaldo Glassing had made mention if dizziness persists, could refer to vascular surgery.  On discussion with him, he describes a pressure sensation in his head.  No actual headache.  No increased sinus congestion.  Also reports some dizziness with rolling over in bed and with certain position changes.  States ears feel full.  No chest pain.  Breathing stable.  Eating.  No nausea or vomiting.  Blood pressure has been elevated.  See attached list.  Recently checks 160-180/60-70s.     Past Medical History:  Diagnosis Date  . Anemia    iron deficiency  . Arthritis   . Atrial fibrillation ([place])   . Bilateral renal cysts    worked up by Dr Bernardo Heater  . BPH (benign prostatic hypertrophy)   . CAD (coronary artery disease)    s/p bypass graft x 4 (1991)  . Diverticulosis   . GERD (gastroesophageal reflux disease)   . Glaucoma   . Hypercholesterolemia   . Hypertension   . Pancreatitis    x2 (1994 and 1998), presumed to be gallstone pancreatitis.  s/p ERCP and sphincterotomy   Past Surgical History:  Procedure Laterality Date  . APPENDECTOMY    . CHOLECYSTECTOMY  1994  . CORONARY ARTERY BYPASS GRAFT     x 4,  1991  . INGUINAL HERNIA REPAIR  2009  . open heart surgery  1991   Family History  Problem Relation Age of Onset  . Throat cancer Father     oropharyngeal cancer  . Kidney disease Sister   . Heart disease    . Colon  cancer Neg Hx   . Prostate cancer Neg Hx    Social History   Social History  . Marital status: Married    Spouse name: N/A  . Number of children: N/A  . Years of education: N/A   Social History Main Topics  . Smoking status: Former Smoker    Quit date: 01/14/1964  . Smokeless tobacco: Never Used  . Alcohol use No  . Drug use: No  . Sexual activity: Not Asked   Other Topics Concern  . None   Social History Narrative  . None    Outpatient Encounter Prescriptions as of 11/21/2015  Medication Sig  . Ascorbic Acid (VITAMIN C PO) Take 1 tablet by mouth daily. Reported on 06/20/2015  . atorvastatin (LIPITOR) 20 MG tablet Take 20 mg by mouth at bedtime.  . Calcium Carbonate-Vitamin D (CALCIUM 600+D) 600-400 MG-UNIT per tablet Take 1 tablet by mouth daily. Reported on 06/20/2015  . carbidopa-levodopa (SINEMET IR) 25-100 MG tablet Take 1 tablet by mouth 3 (three) times daily. Reported on 06/20/2015  . citalopram (CELEXA) 20 MG tablet Take 1 tablet (20 mg total) by mouth daily.  Marland Kitchen HYDROcodone-acetaminophen (NORCO/VICODIN) 5-325 MG tablet Take 1 tablet by mouth  every 6 (six) hours as needed for moderate pain. Reported on 06/09/2015  . latanoprost (XALATAN) 0.005 % ophthalmic solution Place 1 drop into both eyes at bedtime.   Marland Kitchen MAGNESIUM PO Take 1 tablet by mouth daily. Reported on 06/20/2015  . metoprolol tartrate (LOPRESSOR) 25 MG tablet Take 25 mg by mouth 2 (two) times daily.  . Multiple Vitamin (MULTIVITAMIN WITH MINERALS) TABS tablet Take 1 tablet by mouth daily.  Marland Kitchen omega-3 acid ethyl esters (LOVAZA) 1 g capsule Take 1 g by mouth daily. Reported on 06/20/2015  . tamsulosin (FLOMAX) 0.4 MG CAPS capsule Take 0.4 mg by mouth daily after supper.   . tamsulosin (FLOMAX) 0.4 MG CAPS capsule TAKE 1 CAPSULE (0.4 MG TOTAL) BY MOUTH DAILY.  . [DISCONTINUED] citalopram (CELEXA) 20 MG tablet Take 20 mg by mouth daily.  . [DISCONTINUED] omeprazole (PRILOSEC) 20 MG capsule Take 20 mg by mouth daily.  Marland Kitchen  amLODipine (NORVASC) 5 MG tablet Take 1 tablet (5 mg total) by mouth daily.  . pantoprazole (PROTONIX) 40 MG tablet Take 1 tablet (40 mg total) by mouth daily.   No facility-administered encounter medications on file as of 11/21/2015.     Review of Systems  Constitutional: Negative for appetite change and unexpected weight change.  HENT: Negative for congestion and sinus pressure.   Respiratory: Negative for cough, chest tightness and shortness of breath.   Cardiovascular: Negative for chest pain, palpitations and leg swelling.  Gastrointestinal: Negative for abdominal pain, diarrhea, nausea and vomiting.  Genitourinary: Negative for difficulty urinating and dysuria.  Musculoskeletal: Negative for back pain and joint swelling.  Skin: Negative for color change and rash.  Neurological: Positive for dizziness and light-headedness. Negative for headaches.  Psychiatric/Behavioral: Negative for agitation and dysphoric mood.       Objective:     Blood pressure rechecked by me:  164/78  Physical Exam  Constitutional: He appears well-developed and well-nourished. No distress.  HENT:  Nose: Nose normal.  Mouth/Throat: Oropharynx is clear and moist.  Neck: Neck supple. No thyromegaly present.  Cardiovascular: Normal rate and regular rhythm.   Pulmonary/Chest: Effort normal and breath sounds normal. No respiratory distress.  Abdominal: Soft. Bowel sounds are normal. There is no tenderness.  Musculoskeletal: He exhibits no edema or tenderness.  Lymphadenopathy:    He has no cervical adenopathy.  Skin: No rash noted. No erythema.  Psychiatric: He has a normal mood and affect. His behavior is normal.    BP (!) 172/80   Pulse 64   Temp 97.6 F (36.4 C) (Oral)   Ht 5\' 6"  (1.676 m)   Wt 150 lb (68 kg)   SpO2 95%   BMI 24.21 kg/m  Wt Readings from Last 3 Encounters:  11/21/15 150 lb (68 kg)  08/15/15 144 lb 2 oz (65.4 kg)  06/20/15 142 lb (64.4 kg)     Lab Results  Component  Value Date   WBC 6.2 08/15/2015   HGB 13.3 08/15/2015   HCT 40.4 08/15/2015   PLT 187.0 08/15/2015   GLUCOSE 113 (H) 08/15/2015   CHOL 100 03/12/2015   TRIG 83.0 03/12/2015   HDL 33.20 (L) 03/12/2015   LDLCALC 50 03/12/2015   ALT 21 06/12/2015   AST 29 06/12/2015   NA 136 08/15/2015   K 4.2 08/15/2015   CL 99 08/15/2015   CREATININE 0.89 08/15/2015   BUN 14 08/15/2015   CO2 28 08/15/2015   TSH 1.99 05/01/2014   INR 1.20 06/14/2015   HGBA1C 6.2 03/15/2012  Ir Angiogram Visceral Selective  Result Date: 06/09/2015 CLINICAL DATA:  Acute lower GI bleed. Positive tagged red blood cell study localizing to the ascending colon. Unstable on pressors. Recent Coumadin use, partially reversed. EXAM: SELECTIVE VISCERAL ARTERIOGRAPHY; IR ULTRASOUND GUIDANCE VASC ACCESS LEFT ANESTHESIA/SEDATION: none MEDICATIONS: Lidocaine 1% subcutaneous CONTRAST:  161mL OMNIPAQUE IOHEXOL 300 MG/ML SOLN, 8mL ISOVUE-300 IOPAMIDOL (ISOVUE-300) INJECTION 61% PROCEDURE: The procedure, risks (including but not limited to bleeding, infection, organ damage ), benefits, and alternatives were explained to the patient and family. Questions regarding the procedure were encouraged and answered. The patient understands and consents to the procedure. Review of previous CT demonstrated significant atheromatous change in the right iliac arterial system, so left-sided approach was selected. The left femoral region prepped and draped in usual sterile fashion. Maximal barrier sterile technique was utilized including caps, mask, sterile gowns, sterile gloves, sterile drape, hand hygiene and skin antiseptic. The left common femoral artery was localized under ultrasound, patency confirmed and documented. Under real-time ultrasound guidance, the vessel was accessed with a 21-gauge micropuncture needle, exchanged over a 018 guidewire for a transitional dilator, through which a 035 guidewire was advanced. Over this, a 5 [place] vascular sheath  was placed, through which a 5 [place] C2 catheter was advanced and used to selectively catheterize the superior mesenteric artery for selective arteriography in multiple projections. A RIM catheter was then utilized to selectively catheterize the inferior mesenteric artery for selective arteriography in multiple projections. The C2 was then utilized to selectively catheterize the celiac axis for selective arteriography. The catheter was removed. The sheath was secured externally with 0 Prolene suture and placed to saline flush. Site covered with a sterile dressing. The patient tolerated the procedure well. COMPLICATIONS: None immediate FINDINGS: No evidence of active extravasation, early draining vein, AVM, or other lesion to suggest a site or etiology of the patient's GI bleeding. Venous phase confirms patency of the SMV, IMV and portal venous system. IMPRESSION: 1. Negative three-vessel mesenteric arteriogram. No evidence of active extravasation or other focal lesion to suggest etiology of GI bleed. Critical Value/emergent results were called by telephone at the conclusion of the procedure on 06/09/2015 at 3:50am to Dr. Boone Master , who verbally acknowledged these results. Electronically Signed   By: Lucrezia Europe M.D.   On: 06/09/2015 09:09   Ir Angiogram Visceral Selective  Result Date: 06/09/2015 CLINICAL DATA:  Acute lower GI bleed. Positive tagged red blood cell study localizing to the ascending colon. Unstable on pressors. Recent Coumadin use, partially reversed. EXAM: SELECTIVE VISCERAL ARTERIOGRAPHY; IR ULTRASOUND GUIDANCE VASC ACCESS LEFT ANESTHESIA/SEDATION: none MEDICATIONS: Lidocaine 1% subcutaneous CONTRAST:  130mL OMNIPAQUE IOHEXOL 300 MG/ML SOLN, 93mL ISOVUE-300 IOPAMIDOL (ISOVUE-300) INJECTION 61% PROCEDURE: The procedure, risks (including but not limited to bleeding, infection, organ damage ), benefits, and alternatives were explained to the patient and family. Questions regarding the procedure  were encouraged and answered. The patient understands and consents to the procedure. Review of previous CT demonstrated significant atheromatous change in the right iliac arterial system, so left-sided approach was selected. The left femoral region prepped and draped in usual sterile fashion. Maximal barrier sterile technique was utilized including caps, mask, sterile gowns, sterile gloves, sterile drape, hand hygiene and skin antiseptic. The left common femoral artery was localized under ultrasound, patency confirmed and documented. Under real-time ultrasound guidance, the vessel was accessed with a 21-gauge micropuncture needle, exchanged over a 018 guidewire for a transitional dilator, through which a 035 guidewire was advanced. Over this, a 5 [place] vascular sheath  was placed, through which a 5 [place] C2 catheter was advanced and used to selectively catheterize the superior mesenteric artery for selective arteriography in multiple projections. A RIM catheter was then utilized to selectively catheterize the inferior mesenteric artery for selective arteriography in multiple projections. The C2 was then utilized to selectively catheterize the celiac axis for selective arteriography. The catheter was removed. The sheath was secured externally with 0 Prolene suture and placed to saline flush. Site covered with a sterile dressing. The patient tolerated the procedure well. COMPLICATIONS: None immediate FINDINGS: No evidence of active extravasation, early draining vein, AVM, or other lesion to suggest a site or etiology of the patient's GI bleeding. Venous phase confirms patency of the SMV, IMV and portal venous system. IMPRESSION: 1. Negative three-vessel mesenteric arteriogram. No evidence of active extravasation or other focal lesion to suggest etiology of GI bleed. Critical Value/emergent results were called by telephone at the conclusion of the procedure on 06/09/2015 at 3:50am to Dr. Boone Master , who verbally  acknowledged these results. Electronically Signed   By: Lucrezia Europe M.D.   On: 06/09/2015 09:09   Ir Angiogram Visceral Selective  Result Date: 06/09/2015 CLINICAL DATA:  Acute lower GI bleed. Positive tagged red blood cell study localizing to the ascending colon. Unstable on pressors. Recent Coumadin use, partially reversed. EXAM: SELECTIVE VISCERAL ARTERIOGRAPHY; IR ULTRASOUND GUIDANCE VASC ACCESS LEFT ANESTHESIA/SEDATION: none MEDICATIONS: Lidocaine 1% subcutaneous CONTRAST:  151mL OMNIPAQUE IOHEXOL 300 MG/ML SOLN, 62mL ISOVUE-300 IOPAMIDOL (ISOVUE-300) INJECTION 61% PROCEDURE: The procedure, risks (including but not limited to bleeding, infection, organ damage ), benefits, and alternatives were explained to the patient and family. Questions regarding the procedure were encouraged and answered. The patient understands and consents to the procedure. Review of previous CT demonstrated significant atheromatous change in the right iliac arterial system, so left-sided approach was selected. The left femoral region prepped and draped in usual sterile fashion. Maximal barrier sterile technique was utilized including caps, mask, sterile gowns, sterile gloves, sterile drape, hand hygiene and skin antiseptic. The left common femoral artery was localized under ultrasound, patency confirmed and documented. Under real-time ultrasound guidance, the vessel was accessed with a 21-gauge micropuncture needle, exchanged over a 018 guidewire for a transitional dilator, through which a 035 guidewire was advanced. Over this, a 5 [place] vascular sheath was placed, through which a 5 [place] C2 catheter was advanced and used to selectively catheterize the superior mesenteric artery for selective arteriography in multiple projections. A RIM catheter was then utilized to selectively catheterize the inferior mesenteric artery for selective arteriography in multiple projections. The C2 was then utilized to selectively catheterize the  celiac axis for selective arteriography. The catheter was removed. The sheath was secured externally with 0 Prolene suture and placed to saline flush. Site covered with a sterile dressing. The patient tolerated the procedure well. COMPLICATIONS: None immediate FINDINGS: No evidence of active extravasation, early draining vein, AVM, or other lesion to suggest a site or etiology of the patient's GI bleeding. Venous phase confirms patency of the SMV, IMV and portal venous system. IMPRESSION: 1. Negative three-vessel mesenteric arteriogram. No evidence of active extravasation or other focal lesion to suggest etiology of GI bleed. Critical Value/emergent results were called by telephone at the conclusion of the procedure on 06/09/2015 at 3:50am to Dr. Boone Master , who verbally acknowledged these results. Electronically Signed   By: Lucrezia Europe M.D.   On: 06/09/2015 09:09   Ir US Guide Vasc Access Left  Result Date: 06/09/2015 CLINICAL  DATA:  Acute lower GI bleed. Positive tagged red blood cell study localizing to the ascending colon. Unstable on pressors. Recent Coumadin use, partially reversed. EXAM: SELECTIVE VISCERAL ARTERIOGRAPHY; IR ULTRASOUND GUIDANCE VASC ACCESS LEFT ANESTHESIA/SEDATION: none MEDICATIONS: Lidocaine 1% subcutaneous CONTRAST:  19mL OMNIPAQUE IOHEXOL 300 MG/ML SOLN, 75mL ISOVUE-300 IOPAMIDOL (ISOVUE-300) INJECTION 61% PROCEDURE: The procedure, risks (including but not limited to bleeding, infection, organ damage ), benefits, and alternatives were explained to the patient and family. Questions regarding the procedure were encouraged and answered. The patient understands and consents to the procedure. Review of previous CT demonstrated significant atheromatous change in the right iliac arterial system, so left-sided approach was selected. The left femoral region prepped and draped in usual sterile fashion. Maximal barrier sterile technique was utilized including caps, mask, sterile gowns, sterile  gloves, sterile drape, hand hygiene and skin antiseptic. The left common femoral artery was localized under ultrasound, patency confirmed and documented. Under real-time ultrasound guidance, the vessel was accessed with a 21-gauge micropuncture needle, exchanged over a 018 guidewire for a transitional dilator, through which a 035 guidewire was advanced. Over this, a 5 [place] vascular sheath was placed, through which a 5 [place] C2 catheter was advanced and used to selectively catheterize the superior mesenteric artery for selective arteriography in multiple projections. A RIM catheter was then utilized to selectively catheterize the inferior mesenteric artery for selective arteriography in multiple projections. The C2 was then utilized to selectively catheterize the celiac axis for selective arteriography. The catheter was removed. The sheath was secured externally with 0 Prolene suture and placed to saline flush. Site covered with a sterile dressing. The patient tolerated the procedure well. COMPLICATIONS: None immediate FINDINGS: No evidence of active extravasation, early draining vein, AVM, or other lesion to suggest a site or etiology of the patient's GI bleeding. Venous phase confirms patency of the SMV, IMV and portal venous system. IMPRESSION: 1. Negative three-vessel mesenteric arteriogram. No evidence of active extravasation or other focal lesion to suggest etiology of GI bleed. Critical Value/emergent results were called by telephone at the conclusion of the procedure on 06/09/2015 at 3:50am to Dr. Boone Master , who verbally acknowledged these results. Electronically Signed   By: Lucrezia Europe M.D.   On: 06/09/2015 09:09       Assessment & Plan:   Problem List Items Addressed This Visit    CAD (coronary artery disease)    Followed by cardiology.  Stable.        Relevant Medications   amLODipine (NORVASC) 5 MG tablet   Chronic atrial fibrillation (HCC)    Not on coumadin now.  Had significant  bleeding.  See cardiology note.  Controlled.  Follow.       Relevant Medications   amLODipine (NORVASC) 5 MG tablet   Hypercholesteremia    On lipitor.  Follow lipid panel and liver function tests.        Relevant Medications   amLODipine (NORVASC) 5 MG tablet   Hypertension    Blood pressure elevated.  Will start amlodipine 5mg  q day.  Continue metoprolol.  Follow.        Relevant Medications   amLODipine (NORVASC) 5 MG tablet   Light headedness    Persistent intermittent episodes.  Just evaluated by cardiology as outlined.  Had echo and carotid ultrasound.  Treat blood pressure.  Elevated recently.  He also describes dizziness when rolls over and with certain position changes.  Explained I feel his symptoms are multifactorial.  Given the dizziness with rolling  over and position changes, will have ENT to evaluate.  Discussed further evaluation for his carotid disease.  He wants to hold at this time.  Discussed referral back to neurology.  Also discussed scanning, given the increased pressure sensation.  He declines.  Does desire ENT evaluation.  Treat blood pressure and get him back in soon to reassess.        Relevant Orders   Ambulatory referral to ENT   Lower GI bleed    Has had no further bleeding.  Follow.          I spent 40 minutes with the patient and more than 50% of the time was spent in consultation regarding the above.  Time was spent obtaining history from the pt and his wife.  Also time spent examining pt and discussed further treatment options and plans for further evaluation.     [name], MD

## 2015-11-21 NOTE — Patient Instructions (Signed)
Stop prilosec and start protonix  - one per day.    Start amlodipine 5mg  per day for blood pressure

## 2015-11-21 NOTE — Progress Notes (Signed)
Pre visit review using our clinic review tool, if applicable. No additional management support is needed unless otherwise documented below in the visit note. 

## 2015-11-22 ENCOUNTER — Encounter: Payer: Self-pay | Admitting: Internal Medicine

## 2015-11-22 NOTE — Assessment & Plan Note (Signed)
Not on coumadin now.  Had significant bleeding.  See cardiology note.  Controlled.  Follow.

## 2015-11-22 NOTE — Assessment & Plan Note (Signed)
Blood pressure elevated.  Will start amlodipine 5mg  q day.  Continue metoprolol.  Follow.

## 2015-11-22 NOTE — Assessment & Plan Note (Signed)
On lipitor.  Follow lipid panel and liver function tests.   

## 2015-11-22 NOTE — Assessment & Plan Note (Signed)
Persistent intermittent episodes.  Just evaluated by cardiology as outlined.  Had echo and carotid ultrasound.  Treat blood pressure.  Elevated recently.  He also describes dizziness when rolls over and with certain position changes.  Explained I feel his symptoms are multifactorial.  Given the dizziness with rolling over and position changes, will have ENT to evaluate.  Discussed further evaluation for his carotid disease.  He wants to hold at this time.  Discussed referral back to neurology.  Also discussed scanning, given the increased pressure sensation.  He declines.  Does desire ENT evaluation.  Treat blood pressure and get him back in soon to reassess.

## 2015-11-22 NOTE — Assessment & Plan Note (Signed)
Has had no further bleeding.  Follow.

## 2015-11-22 NOTE — Assessment & Plan Note (Signed)
Followed by cardiology. Stable.   

## 2015-11-26 ENCOUNTER — Other Ambulatory Visit: Payer: Self-pay | Admitting: Internal Medicine

## 2015-11-28 DIAGNOSIS — H8112 Benign paroxysmal vertigo, left ear: Secondary | ICD-10-CM | POA: Diagnosis not present

## 2015-11-28 DIAGNOSIS — R42 Dizziness and giddiness: Secondary | ICD-10-CM | POA: Diagnosis not present

## 2015-11-28 DIAGNOSIS — H903 Sensorineural hearing loss, bilateral: Secondary | ICD-10-CM | POA: Diagnosis not present

## 2015-11-28 DIAGNOSIS — J301 Allergic rhinitis due to pollen: Secondary | ICD-10-CM | POA: Diagnosis not present

## 2015-12-12 DIAGNOSIS — C4449 Other specified malignant neoplasm of skin of scalp and neck: Secondary | ICD-10-CM | POA: Diagnosis not present

## 2015-12-12 DIAGNOSIS — C4441 Basal cell carcinoma of skin of scalp and neck: Secondary | ICD-10-CM | POA: Diagnosis not present

## 2015-12-19 DIAGNOSIS — C44311 Basal cell carcinoma of skin of nose: Secondary | ICD-10-CM | POA: Diagnosis not present

## 2015-12-26 ENCOUNTER — Ambulatory Visit (INDEPENDENT_AMBULATORY_CARE_PROVIDER_SITE_OTHER): Payer: Medicare HMO | Admitting: Internal Medicine

## 2015-12-26 ENCOUNTER — Encounter: Payer: Self-pay | Admitting: Internal Medicine

## 2015-12-26 VITALS — BP 138/74 | HR 60 | Temp 97.6°F | Resp 20 | Wt 150.4 lb

## 2015-12-26 DIAGNOSIS — R634 Abnormal weight loss: Secondary | ICD-10-CM

## 2015-12-26 DIAGNOSIS — I10 Essential (primary) hypertension: Secondary | ICD-10-CM | POA: Diagnosis not present

## 2015-12-26 DIAGNOSIS — D649 Anemia, unspecified: Secondary | ICD-10-CM | POA: Diagnosis not present

## 2015-12-26 DIAGNOSIS — I2510 Atherosclerotic heart disease of native coronary artery without angina pectoris: Secondary | ICD-10-CM | POA: Diagnosis not present

## 2015-12-26 DIAGNOSIS — E7800 Pure hypercholesterolemia, unspecified: Secondary | ICD-10-CM | POA: Diagnosis not present

## 2015-12-26 DIAGNOSIS — I482 Chronic atrial fibrillation, unspecified: Secondary | ICD-10-CM

## 2015-12-26 DIAGNOSIS — R42 Dizziness and giddiness: Secondary | ICD-10-CM | POA: Diagnosis not present

## 2015-12-26 DIAGNOSIS — K922 Gastrointestinal hemorrhage, unspecified: Secondary | ICD-10-CM

## 2015-12-26 NOTE — Progress Notes (Signed)
Pre visit review using our clinic review tool, if applicable. No additional management support is needed unless otherwise documented below in the visit note. 

## 2015-12-26 NOTE — Progress Notes (Signed)
[name]: Tyler Stout, male   DOB: 05/17/1925, 82 y.o.   MRN: [id number]   Subjective:    [name]: Tyler Stout, male    DOB: 1925/05/01, 82 y.o.   MRN: [id number]  HPI  Patient here for a scheduled follow up.  He is accompanied by his wife.  History obtained from both of them.  He had been having increased problems with dizziness.  See last note.  Referred to ENT.  Found to have BPPV affecting the left ear.  Is s/p epley maneuvers.  Is better.  Feels better.  Eating.  Discussed importance of staying hydrated.  Blood pressure is doing better.  His out side checks are now averaging 120-130s/50-60s.  No chest pain.  Breathing stable.  No abdominal pain.  Bowels stable.     Past Medical History:  Diagnosis Date  . Anemia    iron deficiency  . Arthritis   . Atrial fibrillation ([place])   . Bilateral renal cysts    worked up by Dr Bernardo Heater  . BPH (benign prostatic hypertrophy)   . CAD (coronary artery disease)    s/p bypass graft x 4 (1991)  . Diverticulosis   . GERD (gastroesophageal reflux disease)   . Glaucoma   . Hypercholesterolemia   . Hypertension   . Pancreatitis    x2 (1994 and 1998), presumed to be gallstone pancreatitis.  s/p ERCP and sphincterotomy   Past Surgical History:  Procedure Laterality Date  . APPENDECTOMY    . CHOLECYSTECTOMY  1994  . CORONARY ARTERY BYPASS GRAFT     x 4,  1991  . INGUINAL HERNIA REPAIR  2009  . open heart surgery  1991   Family History  Problem Relation Age of Onset  . Throat cancer Father     oropharyngeal cancer  . Kidney disease Sister   . Heart disease    . Colon cancer Neg Hx   . Prostate cancer Neg Hx    Social History   Social History  . Marital status: Married    Spouse name: N/A  . Number of children: N/A  . Years of education: N/A   Social History Main Topics  . Smoking status: Former Smoker    Quit date: 01/14/1964  . Smokeless tobacco: Never Used  . Alcohol use No  . Drug use: No  . Sexual activity: Not  Asked   Other Topics Concern  . None   Social History Narrative  . None    Outpatient Encounter Prescriptions as of 12/26/2015  Medication Sig  . amLODipine (NORVASC) 5 MG tablet Take 1 tablet (5 mg total) by mouth daily.  . Ascorbic Acid (VITAMIN C PO) Take 1 tablet by mouth daily. Reported on 06/20/2015  . atorvastatin (LIPITOR) 20 MG tablet Take 20 mg by mouth at bedtime.  . Calcium Carbonate-Vitamin D (CALCIUM 600+D) 600-400 MG-UNIT per tablet Take 1 tablet by mouth daily. Reported on 06/20/2015  . citalopram (CELEXA) 20 MG tablet Take 1 tablet (20 mg total) by mouth daily.  Marland Kitchen latanoprost (XALATAN) 0.005 % ophthalmic solution Place 1 drop into both eyes at bedtime.   Marland Kitchen MAGNESIUM PO Take 1 tablet by mouth daily. Reported on 06/20/2015  . metoprolol tartrate (LOPRESSOR) 25 MG tablet Take 25 mg by mouth 2 (two) times daily.  . Multiple Vitamin (MULTIVITAMIN WITH MINERALS) TABS tablet Take 1 tablet by mouth daily.  . pantoprazole (PROTONIX) 40 MG tablet Take 1 tablet (40 mg total) by mouth daily.  . tamsulosin (  FLOMAX) 0.4 MG CAPS capsule Take 0.4 mg by mouth daily after supper.   . [DISCONTINUED] omega-3 acid ethyl esters (LOVAZA) 1 g capsule Take 1 g by mouth daily. Reported on 06/20/2015  . [DISCONTINUED] tamsulosin (FLOMAX) 0.4 MG CAPS capsule TAKE 1 CAPSULE (0.4 MG TOTAL) BY MOUTH DAILY.  . [DISCONTINUED] atorvastatin (LIPITOR) 20 MG tablet TAKE 1 TABLET (20 MG TOTAL) BY MOUTH DAILY. (Patient not taking: Reported on 12/26/2015)  . [DISCONTINUED] carbidopa-levodopa (SINEMET IR) 25-100 MG tablet Take 1 tablet by mouth 3 (three) times daily. Reported on 06/20/2015  . [DISCONTINUED] HYDROcodone-acetaminophen (NORCO/VICODIN) 5-325 MG tablet Take 1 tablet by mouth every 6 (six) hours as needed for moderate pain. Reported on 06/09/2015   No facility-administered encounter medications on file as of 12/26/2015.     Review of Systems  Constitutional: Negative for appetite change and unexpected weight  change.  HENT: Negative for congestion and sinus pressure.   Respiratory: Negative for cough, chest tightness and shortness of breath.   Cardiovascular: Negative for chest pain, palpitations and leg swelling.  Gastrointestinal: Negative for abdominal pain, diarrhea, nausea and vomiting.  Musculoskeletal: Negative for joint swelling and myalgias.  Skin: Negative for color change and rash.  Neurological: Positive for dizziness. Negative for headaches.  Psychiatric/Behavioral: Negative for agitation and dysphoric mood.       Objective:     Blood pressure rechecked by me:  138/72  Physical Exam  Constitutional: He appears well-developed and well-nourished. No distress.  HENT:  Nose: Nose normal.  Mouth/Throat: Oropharynx is clear and moist.  Neck: Neck supple. No thyromegaly present.  Cardiovascular: Normal rate and regular rhythm.   Pulmonary/Chest: Effort normal and breath sounds normal. No respiratory distress.  Abdominal: Soft. Bowel sounds are normal. There is no tenderness.  Musculoskeletal: He exhibits no edema or tenderness.  Lymphadenopathy:    He has no cervical adenopathy.  Skin: No rash noted. No erythema.  Psychiatric: He has a normal mood and affect. His behavior is normal.    BP 138/74 (BP Location: Left Arm, Patient Position: Sitting, Cuff Size: Normal)   Pulse 60   Temp 97.6 F (36.4 C) (Oral)   Resp 20   Wt 150 lb 6 oz (68.2 kg)   SpO2 96%   BMI 24.27 kg/m  Wt Readings from Last 3 Encounters:  12/26/15 150 lb 6 oz (68.2 kg)  11/21/15 150 lb (68 kg)  08/15/15 144 lb 2 oz (65.4 kg)     Lab Results  Component Value Date   WBC 6.2 08/15/2015   HGB 13.3 08/15/2015   HCT 40.4 08/15/2015   PLT 187.0 08/15/2015   GLUCOSE 113 (H) 08/15/2015   CHOL 100 03/12/2015   TRIG 83.0 03/12/2015   HDL 33.20 (L) 03/12/2015   LDLCALC 50 03/12/2015   ALT 21 06/12/2015   AST 29 06/12/2015   NA 136 08/15/2015   K 4.2 08/15/2015   CL 99 08/15/2015   CREATININE 0.89  08/15/2015   BUN 14 08/15/2015   CO2 28 08/15/2015   TSH 1.99 05/01/2014   INR 1.20 06/14/2015   HGBA1C 6.2 03/15/2012       Assessment & Plan:   Problem List Items Addressed This Visit    Anemia - Primary   Relevant Orders   CBC with Differential/Platelet   Ferritin   CAD (coronary artery disease)    Followed by cardiology.  Stable.        Chronic atrial fibrillation ([facility])    Not on coumadin now.  Had significant bleeding.  Rate controlled.  Continue f/u with cardiology.        Hypercholesteremia    On lipitor.  Low cholesterol diet and exercise.  Follow lipid panel and liver function tests.        Relevant Orders   Hepatic function panel   Lipid panel   Hypertension    Blood pressure doing better.  Continue same medication regimen.  Follow metabolic panel.        Relevant Orders   TSH   Basic metabolic panel   Light headedness    Overall is better.  Saw ENT.  Diagnosed with BPPV - left ear.  S/p epley maneuvers.  Better.  Follow.  See last note for w/up.        Loss of weight    Weight stable from previous check and increased from the check prior.  Follow.       Lower GI bleed    Has had no further bleeding.  Off coumadin.  Followed by cardiology.  Will contact cardiology.  See if can restart aspirin.            [name], MD

## 2015-12-31 ENCOUNTER — Encounter: Payer: Self-pay | Admitting: Internal Medicine

## 2015-12-31 NOTE — Assessment & Plan Note (Signed)
Overall is better.  Saw ENT.  Diagnosed with BPPV - left ear.  S/p epley maneuvers.  Better.  Follow.  See last note for w/up.

## 2015-12-31 NOTE — Assessment & Plan Note (Signed)
Has had no further bleeding.  Off coumadin.  Followed by cardiology.  Will contact cardiology.  See if can restart aspirin.

## 2015-12-31 NOTE — Assessment & Plan Note (Signed)
Followed by cardiology. Stable.   

## 2015-12-31 NOTE — Assessment & Plan Note (Signed)
Blood pressure doing better.  Continue same medication regimen.  Follow metabolic panel.

## 2015-12-31 NOTE — Assessment & Plan Note (Signed)
On lipitor.  Low cholesterol diet and exercise.  Follow lipid panel and liver function tests.   

## 2015-12-31 NOTE — Assessment & Plan Note (Signed)
Weight stable from previous check and increased from the check prior.  Follow.

## 2015-12-31 NOTE — Assessment & Plan Note (Signed)
Not on coumadin now.  Had significant bleeding.  Rate controlled.  Continue f/u with cardiology.

## 2016-01-02 DIAGNOSIS — C44311 Basal cell carcinoma of skin of nose: Secondary | ICD-10-CM | POA: Diagnosis not present

## 2016-01-12 ENCOUNTER — Other Ambulatory Visit: Payer: Self-pay | Admitting: Internal Medicine

## 2016-01-17 ENCOUNTER — Other Ambulatory Visit: Payer: Medicare HMO

## 2016-01-23 ENCOUNTER — Other Ambulatory Visit (INDEPENDENT_AMBULATORY_CARE_PROVIDER_SITE_OTHER): Payer: Medicare HMO

## 2016-01-23 DIAGNOSIS — I10 Essential (primary) hypertension: Secondary | ICD-10-CM

## 2016-01-23 DIAGNOSIS — E7800 Pure hypercholesterolemia, unspecified: Secondary | ICD-10-CM

## 2016-01-23 DIAGNOSIS — D649 Anemia, unspecified: Secondary | ICD-10-CM

## 2016-01-23 LAB — HEPATIC FUNCTION PANEL
ALBUMIN: 4.3 g/dL (ref 3.5–5.2)
ALK PHOS: 74 U/L (ref 39–117)
ALT: 14 U/L (ref 0–53)
AST: 21 U/L (ref 0–37)
BILIRUBIN DIRECT: 0.2 mg/dL (ref 0.0–0.3)
TOTAL PROTEIN: 6.7 g/dL (ref 6.0–8.3)
Total Bilirubin: 0.9 mg/dL (ref 0.2–1.2)

## 2016-01-23 LAB — CBC WITH DIFFERENTIAL/PLATELET
BASOS ABS: 0 10*3/uL (ref 0.0–0.1)
Basophils Relative: 0.5 % (ref 0.0–3.0)
EOS PCT: 2.6 % (ref 0.0–5.0)
Eosinophils Absolute: 0.2 10*3/uL (ref 0.0–0.7)
HEMATOCRIT: 40.5 % (ref 39.0–52.0)
Hemoglobin: 13.7 g/dL (ref 13.0–17.0)
LYMPHS ABS: 1.2 10*3/uL (ref 0.7–4.0)
LYMPHS PCT: 19.9 % (ref 12.0–46.0)
MCHC: 33.8 g/dL (ref 30.0–36.0)
MCV: 94.3 fl (ref 78.0–100.0)
MONOS PCT: 10.8 % (ref 3.0–12.0)
Monocytes Absolute: 0.7 10*3/uL (ref 0.1–1.0)
NEUTROS PCT: 66.2 % (ref 43.0–77.0)
Neutro Abs: 4.1 10*3/uL (ref 1.4–7.7)
Platelets: 202 10*3/uL (ref 150.0–400.0)
RBC: 4.29 Mil/uL (ref 4.22–5.81)
RDW: 14.9 % (ref 11.5–15.5)
WBC: 6.2 10*3/uL (ref 4.0–10.5)

## 2016-01-23 LAB — LIPID PANEL
CHOL/HDL RATIO: 3
Cholesterol: 134 mg/dL (ref 0–200)
HDL: 50 mg/dL (ref >39.00)
LDL Cholesterol: 62 mg/dL (ref 0–99)
NONHDL: 84.32
Triglycerides: 110 mg/dL (ref 0.0–149.0)
VLDL: 22 mg/dL (ref 0.0–40.0)

## 2016-01-23 LAB — BASIC METABOLIC PANEL
BUN: 17 mg/dL (ref 6–23)
CHLORIDE: 100 meq/L (ref 96–112)
CO2: 26 mEq/L (ref 19–32)
CREATININE: 0.87 mg/dL (ref 0.40–1.50)
Calcium: 9.1 mg/dL (ref 8.4–10.5)
GFR: 87.91 mL/min (ref >60.00)
Glucose, Bld: 95 mg/dL (ref 70–99)
Potassium: 4.4 mEq/L (ref 3.5–5.1)
Sodium: 135 mEq/L (ref 135–145)

## 2016-01-23 LAB — FERRITIN: Ferritin: 36.4 ng/mL (ref 22.0–322.0)

## 2016-01-23 LAB — TSH: TSH: 1.66 u[IU]/mL (ref 0.35–4.50)

## 2016-02-07 DIAGNOSIS — C44311 Basal cell carcinoma of skin of nose: Secondary | ICD-10-CM | POA: Diagnosis not present

## 2016-03-05 DIAGNOSIS — H402232 Chronic angle-closure glaucoma, bilateral, moderate stage: Secondary | ICD-10-CM | POA: Diagnosis not present

## 2016-03-21 DIAGNOSIS — E782 Mixed hyperlipidemia: Secondary | ICD-10-CM | POA: Diagnosis not present

## 2016-03-21 DIAGNOSIS — E7800 Pure hypercholesterolemia, unspecified: Secondary | ICD-10-CM | POA: Diagnosis not present

## 2016-03-21 DIAGNOSIS — I951 Orthostatic hypotension: Secondary | ICD-10-CM | POA: Diagnosis not present

## 2016-03-21 DIAGNOSIS — I2510 Atherosclerotic heart disease of native coronary artery without angina pectoris: Secondary | ICD-10-CM | POA: Diagnosis not present

## 2016-03-21 DIAGNOSIS — I482 Chronic atrial fibrillation: Secondary | ICD-10-CM | POA: Diagnosis not present

## 2016-03-21 DIAGNOSIS — I480 Paroxysmal atrial fibrillation: Secondary | ICD-10-CM | POA: Diagnosis not present

## 2016-03-26 ENCOUNTER — Encounter: Payer: Self-pay | Admitting: Internal Medicine

## 2016-03-26 ENCOUNTER — Ambulatory Visit (INDEPENDENT_AMBULATORY_CARE_PROVIDER_SITE_OTHER): Payer: Medicare HMO | Admitting: Internal Medicine

## 2016-03-26 DIAGNOSIS — E7800 Pure hypercholesterolemia, unspecified: Secondary | ICD-10-CM | POA: Diagnosis not present

## 2016-03-26 DIAGNOSIS — I10 Essential (primary) hypertension: Secondary | ICD-10-CM

## 2016-03-26 DIAGNOSIS — I2510 Atherosclerotic heart disease of native coronary artery without angina pectoris: Secondary | ICD-10-CM

## 2016-03-26 DIAGNOSIS — I4891 Unspecified atrial fibrillation: Secondary | ICD-10-CM

## 2016-03-26 DIAGNOSIS — D508 Other iron deficiency anemias: Secondary | ICD-10-CM

## 2016-03-26 NOTE — Progress Notes (Signed)
Pre-visit discussion using our clinic review tool. No additional management support is needed unless otherwise documented below in the visit note.  

## 2016-03-26 NOTE — Progress Notes (Signed)
[name]: Tyler Stout, male   DOB: 08-24-1925, 83 y.o.   MRN: [id number]   Subjective:    [name]: Tyler Stout, male    DOB: 07-Dec-1925, 82 y.o.   MRN: [id number]  HPI  Patient here for a scheduled physical exam.  He is accompanied by his wife.  History obtained from both of them.  He is doing relatively well.  No chest pain.  Seeing cardiology.  Evaluated 03/21/16.  Dr Ubaldo Glassing recommended remaining off coumadin.  ECHO 45-50% with mild to moderate pulmonary hypertension.  Symptoms stable.  Breathing stable.  No abdominal pain.  Bowels moving.  No further rectal bleeding.  Blood pressures as outlined.     Past Medical History:  Diagnosis Date  . Anemia    iron deficiency  . Arthritis   . Atrial fibrillation ([place])   . Bilateral renal cysts    worked up by Dr Bernardo Heater  . BPH (benign prostatic hypertrophy)   . CAD (coronary artery disease)    s/p bypass graft x 4 (1991)  . Diverticulosis   . GERD (gastroesophageal reflux disease)   . Glaucoma   . Hypercholesterolemia   . Hypertension   . Pancreatitis    x2 (1994 and 1998), presumed to be gallstone pancreatitis.  s/p ERCP and sphincterotomy   Past Surgical History:  Procedure Laterality Date  . APPENDECTOMY    . CHOLECYSTECTOMY  1994  . CORONARY ARTERY BYPASS GRAFT     x 4,  1991  . INGUINAL HERNIA REPAIR  2009  . open heart surgery  1991   Family History  Problem Relation Age of Onset  . Throat cancer Father     oropharyngeal cancer  . Kidney disease Sister   . Heart disease    . Colon cancer Neg Hx   . Prostate cancer Neg Hx    Social History   Social History  . Marital status: Married    Spouse name: N/A  . Number of children: N/A  . Years of education: N/A   Social History Main Topics  . Smoking status: Former Smoker    Quit date: 01/14/1964  . Smokeless tobacco: Never Used  . Alcohol use No  . Drug use: No  . Sexual activity: Not Asked   Other Topics Concern  . None   Social History Narrative  .  None    Outpatient Encounter Prescriptions as of 03/26/2016  Medication Sig  . amLODipine (NORVASC) 5 MG tablet TAKE 1 TABLET (5 MG TOTAL) BY MOUTH DAILY.  Marland Kitchen Ascorbic Acid (VITAMIN C PO) Take 1 tablet by mouth daily. Reported on 06/20/2015  . atorvastatin (LIPITOR) 20 MG tablet Take 20 mg by mouth at bedtime.  . Calcium Carbonate-Vitamin D (CALCIUM 600+D) 600-400 MG-UNIT per tablet Take 1 tablet by mouth daily. Reported on 06/20/2015  . citalopram (CELEXA) 20 MG tablet TAKE 1 TABLET (20 MG TOTAL) BY MOUTH DAILY.  Marland Kitchen latanoprost (XALATAN) 0.005 % ophthalmic solution Place 1 drop into both eyes at bedtime.   Marland Kitchen MAGNESIUM PO Take 1 tablet by mouth daily. Reported on 06/20/2015  . metoprolol tartrate (LOPRESSOR) 25 MG tablet Take 25 mg by mouth 2 (two) times daily.  . Multiple Vitamin (MULTIVITAMIN WITH MINERALS) TABS tablet Take 1 tablet by mouth daily.  . pantoprazole (PROTONIX) 40 MG tablet Take 1 tablet (40 mg total) by mouth daily.  . tamsulosin (FLOMAX) 0.4 MG CAPS capsule Take 0.4 mg by mouth daily after supper.   . fluticasone (FLONASE) 50  MCG/ACT nasal spray Place 2 sprays into the nose daily.   No facility-administered encounter medications on file as of 03/26/2016.     Review of Systems  Constitutional: Negative for appetite change and unexpected weight change.  HENT: Negative for congestion and sinus pressure.   Eyes: Negative for pain and visual disturbance.  Respiratory: Negative for cough, chest tightness and shortness of breath.   Cardiovascular: Negative for chest pain, palpitations and leg swelling.  Gastrointestinal: Negative for abdominal pain, diarrhea, nausea and vomiting.  Genitourinary: Negative for difficulty urinating and dysuria.  Musculoskeletal: Negative for joint swelling and myalgias.  Skin: Negative for color change and rash.  Neurological: Positive for dizziness. Negative for headaches.  Hematological: Negative for adenopathy. Does not bruise/bleed easily.    Psychiatric/Behavioral: Negative for agitation and dysphoric mood.       Objective:     Blood pressure rechecked by me:  132/68  Physical Exam  Constitutional: He is oriented to person, place, and time. He appears well-developed and well-nourished. No distress.  HENT:  Head: Normocephalic and atraumatic.  Nose: Nose normal.  Mouth/Throat: Oropharynx is clear and moist. No oropharyngeal exudate.  Eyes: Conjunctivae are normal. Right eye exhibits no discharge. Left eye exhibits no discharge.  Neck: Neck supple. No thyromegaly present.  Cardiovascular: Normal rate and regular rhythm.   Pulmonary/Chest: Breath sounds normal. No respiratory distress. He has no wheezes.  Abdominal: Soft. Bowel sounds are normal. There is no tenderness.  Genitourinary:  Genitourinary Comments: Not performed.   Musculoskeletal: He exhibits no edema or tenderness.  Lymphadenopathy:    He has no cervical adenopathy.  Neurological: He is alert and oriented to person, place, and time.  Skin: Skin is warm and dry. No rash noted. No erythema.  Psychiatric: He has a normal mood and affect. His behavior is normal.    BP 134/70 (BP Location: Left Arm, Patient Position: Sitting, Cuff Size: Large)   Pulse 61   Temp 97.6 F (36.4 C) (Oral)   Resp 16   Ht 5\' 6"  (1.676 m)   Wt 155 lb 6.4 oz (70.5 kg)   SpO2 96%   BMI 25.08 kg/m  Wt Readings from Last 3 Encounters:  03/26/16 155 lb 6.4 oz (70.5 kg)  12/26/15 150 lb 6 oz (68.2 kg)  11/21/15 150 lb (68 kg)     Lab Results  Component Value Date   WBC 6.2 01/23/2016   HGB 13.7 01/23/2016   HCT 40.5 01/23/2016   PLT 202.0 01/23/2016   GLUCOSE 95 01/23/2016   CHOL 134 01/23/2016   TRIG 110.0 01/23/2016   HDL 50.00 01/23/2016   LDLCALC 62 01/23/2016   ALT 14 01/23/2016   AST 21 01/23/2016   NA 135 01/23/2016   K 4.4 01/23/2016   CL 100 01/23/2016   CREATININE 0.87 01/23/2016   BUN 17 01/23/2016   CO2 26 01/23/2016   TSH 1.66 01/23/2016   INR  1.20 06/14/2015   HGBA1C 6.2 03/15/2012    Ir Angiogram Visceral Selective  Result Date: 06/09/2015 CLINICAL DATA:  Acute lower GI bleed. Positive tagged red blood cell study localizing to the ascending colon. Unstable on pressors. Recent Coumadin use, partially reversed. EXAM: SELECTIVE VISCERAL ARTERIOGRAPHY; IR ULTRASOUND GUIDANCE VASC ACCESS LEFT ANESTHESIA/SEDATION: none MEDICATIONS: Lidocaine 1% subcutaneous CONTRAST:  155mL OMNIPAQUE IOHEXOL 300 MG/ML SOLN, 4mL ISOVUE-300 IOPAMIDOL (ISOVUE-300) INJECTION 61% PROCEDURE: The procedure, risks (including but not limited to bleeding, infection, organ damage ), benefits, and alternatives were explained to the patient and family.  Questions regarding the procedure were encouraged and answered. The patient understands and consents to the procedure. Review of previous CT demonstrated significant atheromatous change in the right iliac arterial system, so left-sided approach was selected. The left femoral region prepped and draped in usual sterile fashion. Maximal barrier sterile technique was utilized including caps, mask, sterile gowns, sterile gloves, sterile drape, hand hygiene and skin antiseptic. The left common femoral artery was localized under ultrasound, patency confirmed and documented. Under real-time ultrasound guidance, the vessel was accessed with a 21-gauge micropuncture needle, exchanged over a 018 guidewire for a transitional dilator, through which a 035 guidewire was advanced. Over this, a 5 [place] vascular sheath was placed, through which a 5 [place] C2 catheter was advanced and used to selectively catheterize the superior mesenteric artery for selective arteriography in multiple projections. A RIM catheter was then utilized to selectively catheterize the inferior mesenteric artery for selective arteriography in multiple projections. The C2 was then utilized to selectively catheterize the celiac axis for selective arteriography. The catheter was  removed. The sheath was secured externally with 0 Prolene suture and placed to saline flush. Site covered with a sterile dressing. The patient tolerated the procedure well. COMPLICATIONS: None immediate FINDINGS: No evidence of active extravasation, early draining vein, AVM, or other lesion to suggest a site or etiology of the patient's GI bleeding. Venous phase confirms patency of the SMV, IMV and portal venous system. IMPRESSION: 1. Negative three-vessel mesenteric arteriogram. No evidence of active extravasation or other focal lesion to suggest etiology of GI bleed. Critical Value/emergent results were called by telephone at the conclusion of the procedure on 06/09/2015 at 3:50am to Dr. Boone Master , who verbally acknowledged these results. Electronically Signed   By: Lucrezia Europe M.D.   On: 06/09/2015 09:09   Ir Angiogram Visceral Selective  Result Date: 06/09/2015 CLINICAL DATA:  Acute lower GI bleed. Positive tagged red blood cell study localizing to the ascending colon. Unstable on pressors. Recent Coumadin use, partially reversed. EXAM: SELECTIVE VISCERAL ARTERIOGRAPHY; IR ULTRASOUND GUIDANCE VASC ACCESS LEFT ANESTHESIA/SEDATION: none MEDICATIONS: Lidocaine 1% subcutaneous CONTRAST:  161mL OMNIPAQUE IOHEXOL 300 MG/ML SOLN, 12mL ISOVUE-300 IOPAMIDOL (ISOVUE-300) INJECTION 61% PROCEDURE: The procedure, risks (including but not limited to bleeding, infection, organ damage ), benefits, and alternatives were explained to the patient and family. Questions regarding the procedure were encouraged and answered. The patient understands and consents to the procedure. Review of previous CT demonstrated significant atheromatous change in the right iliac arterial system, so left-sided approach was selected. The left femoral region prepped and draped in usual sterile fashion. Maximal barrier sterile technique was utilized including caps, mask, sterile gowns, sterile gloves, sterile drape, hand hygiene and skin  antiseptic. The left common femoral artery was localized under ultrasound, patency confirmed and documented. Under real-time ultrasound guidance, the vessel was accessed with a 21-gauge micropuncture needle, exchanged over a 018 guidewire for a transitional dilator, through which a 035 guidewire was advanced. Over this, a 5 [place] vascular sheath was placed, through which a 5 [place] C2 catheter was advanced and used to selectively catheterize the superior mesenteric artery for selective arteriography in multiple projections. A RIM catheter was then utilized to selectively catheterize the inferior mesenteric artery for selective arteriography in multiple projections. The C2 was then utilized to selectively catheterize the celiac axis for selective arteriography. The catheter was removed. The sheath was secured externally with 0 Prolene suture and placed to saline flush. Site covered with a sterile dressing. The patient tolerated the procedure  well. COMPLICATIONS: None immediate FINDINGS: No evidence of active extravasation, early draining vein, AVM, or other lesion to suggest a site or etiology of the patient's GI bleeding. Venous phase confirms patency of the SMV, IMV and portal venous system. IMPRESSION: 1. Negative three-vessel mesenteric arteriogram. No evidence of active extravasation or other focal lesion to suggest etiology of GI bleed. Critical Value/emergent results were called by telephone at the conclusion of the procedure on 06/09/2015 at 3:50am to Dr. Boone Master , who verbally acknowledged these results. Electronically Signed   By: Lucrezia Europe M.D.   On: 06/09/2015 09:09   Ir Angiogram Visceral Selective  Result Date: 06/09/2015 CLINICAL DATA:  Acute lower GI bleed. Positive tagged red blood cell study localizing to the ascending colon. Unstable on pressors. Recent Coumadin use, partially reversed. EXAM: SELECTIVE VISCERAL ARTERIOGRAPHY; IR ULTRASOUND GUIDANCE VASC ACCESS LEFT ANESTHESIA/SEDATION:  none MEDICATIONS: Lidocaine 1% subcutaneous CONTRAST:  133mL OMNIPAQUE IOHEXOL 300 MG/ML SOLN, 58mL ISOVUE-300 IOPAMIDOL (ISOVUE-300) INJECTION 61% PROCEDURE: The procedure, risks (including but not limited to bleeding, infection, organ damage ), benefits, and alternatives were explained to the patient and family. Questions regarding the procedure were encouraged and answered. The patient understands and consents to the procedure. Review of previous CT demonstrated significant atheromatous change in the right iliac arterial system, so left-sided approach was selected. The left femoral region prepped and draped in usual sterile fashion. Maximal barrier sterile technique was utilized including caps, mask, sterile gowns, sterile gloves, sterile drape, hand hygiene and skin antiseptic. The left common femoral artery was localized under ultrasound, patency confirmed and documented. Under real-time ultrasound guidance, the vessel was accessed with a 21-gauge micropuncture needle, exchanged over a 018 guidewire for a transitional dilator, through which a 035 guidewire was advanced. Over this, a 5 [place] vascular sheath was placed, through which a 5 [place] C2 catheter was advanced and used to selectively catheterize the superior mesenteric artery for selective arteriography in multiple projections. A RIM catheter was then utilized to selectively catheterize the inferior mesenteric artery for selective arteriography in multiple projections. The C2 was then utilized to selectively catheterize the celiac axis for selective arteriography. The catheter was removed. The sheath was secured externally with 0 Prolene suture and placed to saline flush. Site covered with a sterile dressing. The patient tolerated the procedure well. COMPLICATIONS: None immediate FINDINGS: No evidence of active extravasation, early draining vein, AVM, or other lesion to suggest a site or etiology of the patient's GI bleeding. Venous phase confirms  patency of the SMV, IMV and portal venous system. IMPRESSION: 1. Negative three-vessel mesenteric arteriogram. No evidence of active extravasation or other focal lesion to suggest etiology of GI bleed. Critical Value/emergent results were called by telephone at the conclusion of the procedure on 06/09/2015 at 3:50am to Dr. Boone Master , who verbally acknowledged these results. Electronically Signed   By: Lucrezia Europe M.D.   On: 06/09/2015 09:09   Ir US Guide Vasc Access Left  Result Date: 06/09/2015 CLINICAL DATA:  Acute lower GI bleed. Positive tagged red blood cell study localizing to the ascending colon. Unstable on pressors. Recent Coumadin use, partially reversed. EXAM: SELECTIVE VISCERAL ARTERIOGRAPHY; IR ULTRASOUND GUIDANCE VASC ACCESS LEFT ANESTHESIA/SEDATION: none MEDICATIONS: Lidocaine 1% subcutaneous CONTRAST:  128mL OMNIPAQUE IOHEXOL 300 MG/ML SOLN, 32mL ISOVUE-300 IOPAMIDOL (ISOVUE-300) INJECTION 61% PROCEDURE: The procedure, risks (including but not limited to bleeding, infection, organ damage ), benefits, and alternatives were explained to the patient and family. Questions regarding the procedure were encouraged and answered. The  patient understands and consents to the procedure. Review of previous CT demonstrated significant atheromatous change in the right iliac arterial system, so left-sided approach was selected. The left femoral region prepped and draped in usual sterile fashion. Maximal barrier sterile technique was utilized including caps, mask, sterile gowns, sterile gloves, sterile drape, hand hygiene and skin antiseptic. The left common femoral artery was localized under ultrasound, patency confirmed and documented. Under real-time ultrasound guidance, the vessel was accessed with a 21-gauge micropuncture needle, exchanged over a 018 guidewire for a transitional dilator, through which a 035 guidewire was advanced. Over this, a 5 [place] vascular sheath was placed, through which a 5 [place]  C2 catheter was advanced and used to selectively catheterize the superior mesenteric artery for selective arteriography in multiple projections. A RIM catheter was then utilized to selectively catheterize the inferior mesenteric artery for selective arteriography in multiple projections. The C2 was then utilized to selectively catheterize the celiac axis for selective arteriography. The catheter was removed. The sheath was secured externally with 0 Prolene suture and placed to saline flush. Site covered with a sterile dressing. The patient tolerated the procedure well. COMPLICATIONS: None immediate FINDINGS: No evidence of active extravasation, early draining vein, AVM, or other lesion to suggest a site or etiology of the patient's GI bleeding. Venous phase confirms patency of the SMV, IMV and portal venous system. IMPRESSION: 1. Negative three-vessel mesenteric arteriogram. No evidence of active extravasation or other focal lesion to suggest etiology of GI bleed. Critical Value/emergent results were called by telephone at the conclusion of the procedure on 06/09/2015 at 3:50am to Dr. Boone Master , who verbally acknowledged these results. Electronically Signed   By: Lucrezia Europe M.D.   On: 06/09/2015 09:09       Assessment & Plan:   Problem List Items Addressed This Visit    Anemia    Admitted previously for GI bleed.  Follow cbc.       Atrial fibrillation ([facility])    Previously on coumadin.  Off now secondary to GI bleed.  He discussed with Dr Ubaldo Glassing.  Elected to remain off anticoagulation.  Will d/w cardiology regarding aspirin therapy.        CAD (coronary artery disease)    Followed by cardiology.  Had elected to remain off coumadin - per Dr Ubaldo Glassing.  Question about starting aspirin.  D/w cardiology.        Hypercholesteremia    Low cholesterol diet and exercise.  On lipitor.  Follow lipid panel and liver function tests.        Relevant Orders   Hepatic function panel   Lipid panel    Hypertension    Blood pressure as outlined.  Continue same medication regimen.  Follow pressures.  Follow metabolic panel.        Relevant Orders   Basic metabolic panel       [name], MD

## 2016-03-30 ENCOUNTER — Encounter: Payer: Self-pay | Admitting: Internal Medicine

## 2016-03-30 NOTE — Assessment & Plan Note (Signed)
Low cholesterol diet and exercise.  On lipitor.  Follow lipid panel and liver function tests.   

## 2016-03-30 NOTE — Assessment & Plan Note (Signed)
Admitted previously for GI bleed.  Follow cbc.

## 2016-03-30 NOTE — Assessment & Plan Note (Signed)
Previously on coumadin.  Off now secondary to GI bleed.  He discussed with Dr Ubaldo Glassing.  Elected to remain off anticoagulation.  Will d/w cardiology regarding aspirin therapy.

## 2016-03-30 NOTE — Assessment & Plan Note (Signed)
Blood pressure as outlined.  Continue same medication regimen.  Follow pressures.  Follow metabolic panel.  

## 2016-03-30 NOTE — Assessment & Plan Note (Addendum)
Followed by cardiology.  Had elected to remain off coumadin - per Dr Ubaldo Glassing.  Question about starting aspirin.  D/w cardiology.

## 2016-04-04 ENCOUNTER — Telehealth: Payer: Self-pay

## 2016-04-04 DIAGNOSIS — I509 Heart failure, unspecified: Secondary | ICD-10-CM | POA: Diagnosis not present

## 2016-04-04 DIAGNOSIS — R69 Illness, unspecified: Secondary | ICD-10-CM | POA: Diagnosis not present

## 2016-04-04 DIAGNOSIS — Z87891 Personal history of nicotine dependence: Secondary | ICD-10-CM | POA: Diagnosis not present

## 2016-04-04 DIAGNOSIS — M13111 Monoarthritis, not elsewhere classified, right shoulder: Secondary | ICD-10-CM | POA: Diagnosis not present

## 2016-04-04 DIAGNOSIS — H409 Unspecified glaucoma: Secondary | ICD-10-CM | POA: Diagnosis not present

## 2016-04-04 DIAGNOSIS — K219 Gastro-esophageal reflux disease without esophagitis: Secondary | ICD-10-CM | POA: Diagnosis not present

## 2016-04-04 DIAGNOSIS — I110 Hypertensive heart disease with heart failure: Secondary | ICD-10-CM | POA: Diagnosis not present

## 2016-04-04 DIAGNOSIS — H9113 Presbycusis, bilateral: Secondary | ICD-10-CM | POA: Diagnosis not present

## 2016-04-04 DIAGNOSIS — Z951 Presence of aortocoronary bypass graft: Secondary | ICD-10-CM | POA: Diagnosis not present

## 2016-04-04 DIAGNOSIS — N400 Enlarged prostate without lower urinary tract symptoms: Secondary | ICD-10-CM | POA: Diagnosis not present

## 2016-04-04 DIAGNOSIS — R609 Edema, unspecified: Secondary | ICD-10-CM | POA: Diagnosis not present

## 2016-04-04 DIAGNOSIS — I2510 Atherosclerotic heart disease of native coronary artery without angina pectoris: Secondary | ICD-10-CM | POA: Diagnosis not present

## 2016-04-04 DIAGNOSIS — I4891 Unspecified atrial fibrillation: Secondary | ICD-10-CM | POA: Diagnosis not present

## 2016-04-04 DIAGNOSIS — Z6824 Body mass index (BMI) 24.0-24.9, adult: Secondary | ICD-10-CM | POA: Diagnosis not present

## 2016-04-04 DIAGNOSIS — Z0000 Encounter for general adult medical examination without abnormal findings: Secondary | ICD-10-CM | POA: Diagnosis not present

## 2016-04-04 DIAGNOSIS — M47816 Spondylosis without myelopathy or radiculopathy, lumbar region: Secondary | ICD-10-CM | POA: Diagnosis not present

## 2016-04-04 DIAGNOSIS — Z974 Presence of external hearing-aid: Secondary | ICD-10-CM | POA: Diagnosis not present

## 2016-04-04 DIAGNOSIS — Z9181 History of falling: Secondary | ICD-10-CM | POA: Diagnosis not present

## 2016-04-04 NOTE — Telephone Encounter (Signed)
Call received from [facility] at cardiologist. Per Dr. Ubaldo Glassing ok to take 81mg  ASA.

## 2016-04-04 NOTE — Telephone Encounter (Signed)
Please notify pt of Dr Bethanne Ginger response.

## 2016-04-04 NOTE — Telephone Encounter (Signed)
Patients daughter informed no questions.

## 2016-04-07 DIAGNOSIS — L905 Scar conditions and fibrosis of skin: Secondary | ICD-10-CM | POA: Diagnosis not present

## 2016-04-07 DIAGNOSIS — L570 Actinic keratosis: Secondary | ICD-10-CM | POA: Diagnosis not present

## 2016-04-08 ENCOUNTER — Other Ambulatory Visit: Payer: Self-pay | Admitting: Internal Medicine

## 2016-04-08 DIAGNOSIS — H8111 Benign paroxysmal vertigo, right ear: Secondary | ICD-10-CM | POA: Diagnosis not present

## 2016-04-08 DIAGNOSIS — R262 Difficulty in walking, not elsewhere classified: Secondary | ICD-10-CM | POA: Diagnosis not present

## 2016-04-16 DIAGNOSIS — H8111 Benign paroxysmal vertigo, right ear: Secondary | ICD-10-CM | POA: Diagnosis not present

## 2016-04-16 DIAGNOSIS — R262 Difficulty in walking, not elsewhere classified: Secondary | ICD-10-CM | POA: Diagnosis not present

## 2016-04-20 ENCOUNTER — Other Ambulatory Visit: Payer: Self-pay | Admitting: Internal Medicine

## 2016-04-23 DIAGNOSIS — H8111 Benign paroxysmal vertigo, right ear: Secondary | ICD-10-CM | POA: Diagnosis not present

## 2016-04-23 DIAGNOSIS — R262 Difficulty in walking, not elsewhere classified: Secondary | ICD-10-CM | POA: Diagnosis not present

## 2016-05-07 DIAGNOSIS — R262 Difficulty in walking, not elsewhere classified: Secondary | ICD-10-CM | POA: Diagnosis not present

## 2016-05-07 DIAGNOSIS — H8111 Benign paroxysmal vertigo, right ear: Secondary | ICD-10-CM | POA: Diagnosis not present

## 2016-05-11 ENCOUNTER — Other Ambulatory Visit: Payer: Self-pay | Admitting: Internal Medicine

## 2016-06-16 ENCOUNTER — Other Ambulatory Visit: Payer: Self-pay | Admitting: Orthopedic Surgery

## 2016-06-16 DIAGNOSIS — M9933 Osseous stenosis of neural canal of lumbar region: Secondary | ICD-10-CM

## 2016-06-20 ENCOUNTER — Ambulatory Visit
Admission: RE | Admit: 2016-06-20 | Discharge: 2016-06-20 | Disposition: A | Discharge status: Home / Self Care | Payer: Medicare HMO | Source: Ambulatory Visit | Attending: Orthopedic Surgery | Admitting: Orthopedic Surgery

## 2016-06-20 ENCOUNTER — Other Ambulatory Visit: Payer: Self-pay | Admitting: Orthopedic Surgery

## 2016-06-20 DIAGNOSIS — M48061 Spinal stenosis, lumbar region without neurogenic claudication: Secondary | ICD-10-CM | POA: Diagnosis not present

## 2016-06-20 DIAGNOSIS — M9933 Osseous stenosis of neural canal of lumbar region: Secondary | ICD-10-CM

## 2016-06-20 MED ORDER — METHYLPREDNISOLONE ACETATE 40 MG/ML INJ SUSP (RADIOLOG
120.0000 mg | Freq: Once | INTRAMUSCULAR | Status: AC
  Administered 2016-06-20: 120 mg via EPIDURAL

## 2016-06-20 MED ORDER — IOPAMIDOL (ISOVUE-M 200) INJECTION 41%
1.0000 mL | Freq: Once | INTRAMUSCULAR | Status: AC
  Administered 2016-06-20: 1 mL via EPIDURAL

## 2016-06-20 NOTE — Discharge Instructions (Signed)
Post Procedure Spinal Discharge Instruction Sheet ° °1. You may resume a regular diet and any medications that you routinely take (including pain medications). ° °2. No driving day of procedure. ° °3. Light activity throughout the rest of the day.  Do not do any strenuous work, exercise, bending or lifting.  The day following the procedure, you can resume normal physical activity but you should refrain from exercising or physical therapy for at least three days thereafter. ° ° °Common Side Effects: ° °· Restlessness or inability to sleep- you may have trouble sleeping for the next few days.  Ask your referring physician if you need any medication for sleep. ° °· Facial flushing or redness- should subside within a few days. ° °· Increased pain- a temporary increase in pain a day or two following your procedure is not unusual.  Take your pain medication as prescribed by your referring physician. ° °· Leg cramps ° °Please contact our office at [phone] for the following symptoms: °· Fever greater than 100 degrees. °· Headaches unresolved with medication after 2-3 days. °· Increased swelling, pain, or redness at injection site. ° °Thank you for visiting our office. °

## 2016-06-25 ENCOUNTER — Other Ambulatory Visit (INDEPENDENT_AMBULATORY_CARE_PROVIDER_SITE_OTHER): Payer: Medicare HMO

## 2016-06-25 DIAGNOSIS — E7800 Pure hypercholesterolemia, unspecified: Secondary | ICD-10-CM

## 2016-06-25 DIAGNOSIS — I10 Essential (primary) hypertension: Secondary | ICD-10-CM

## 2016-06-25 LAB — BASIC METABOLIC PANEL
BUN: 21 mg/dL (ref 6–23)
CO2: 31 mEq/L (ref 19–32)
Calcium: 9.7 mg/dL (ref 8.4–10.5)
Chloride: 99 mEq/L (ref 96–112)
Creatinine, Ser: 0.92 mg/dL (ref 0.40–1.50)
GFR: 82.34 mL/min (ref >60.00)
Glucose, Bld: 105 mg/dL — ABNORMAL HIGH (ref 70–99)
POTASSIUM: 5 meq/L (ref 3.5–5.1)
SODIUM: 136 meq/L (ref 135–145)

## 2016-06-25 LAB — HEPATIC FUNCTION PANEL
ALT: 14 U/L (ref 0–53)
AST: 17 U/L (ref 0–37)
Albumin: 4.6 g/dL (ref 3.5–5.2)
Alkaline Phosphatase: 84 U/L (ref 39–117)
BILIRUBIN DIRECT: 0.1 mg/dL (ref 0.0–0.3)
BILIRUBIN TOTAL: 0.7 mg/dL (ref 0.2–1.2)
Total Protein: 7.3 g/dL (ref 6.0–8.3)

## 2016-06-25 LAB — LIPID PANEL
CHOL/HDL RATIO: 3
Cholesterol: 152 mg/dL (ref 0–200)
HDL: 59.8 mg/dL (ref >39.00)
LDL CALC: 77 mg/dL (ref 0–99)
NONHDL: 92.27
Triglycerides: 74 mg/dL (ref 0.0–149.0)
VLDL: 14.8 mg/dL (ref 0.0–40.0)

## 2016-06-27 ENCOUNTER — Encounter: Payer: Self-pay | Admitting: Internal Medicine

## 2016-06-27 ENCOUNTER — Ambulatory Visit (INDEPENDENT_AMBULATORY_CARE_PROVIDER_SITE_OTHER): Payer: Medicare HMO | Admitting: Internal Medicine

## 2016-06-27 VITALS — BP 138/62 | HR 57 | Temp 97.6°F | Ht 66.0 in | Wt 149.8 lb

## 2016-06-27 DIAGNOSIS — R42 Dizziness and giddiness: Secondary | ICD-10-CM

## 2016-06-27 DIAGNOSIS — I10 Essential (primary) hypertension: Secondary | ICD-10-CM | POA: Diagnosis not present

## 2016-06-27 DIAGNOSIS — M545 Low back pain: Secondary | ICD-10-CM

## 2016-06-27 DIAGNOSIS — R634 Abnormal weight loss: Secondary | ICD-10-CM

## 2016-06-27 DIAGNOSIS — E7800 Pure hypercholesterolemia, unspecified: Secondary | ICD-10-CM

## 2016-06-27 DIAGNOSIS — R8290 Unspecified abnormal findings in urine: Secondary | ICD-10-CM

## 2016-06-27 DIAGNOSIS — I2510 Atherosclerotic heart disease of native coronary artery without angina pectoris: Secondary | ICD-10-CM | POA: Diagnosis not present

## 2016-06-27 DIAGNOSIS — I482 Chronic atrial fibrillation, unspecified: Secondary | ICD-10-CM

## 2016-06-27 LAB — URINALYSIS, ROUTINE W REFLEX MICROSCOPIC
Bilirubin Urine: NEGATIVE
Hgb urine dipstick: NEGATIVE
Ketones, ur: NEGATIVE
Leukocytes, UA: NEGATIVE
Nitrite: NEGATIVE
Specific Gravity, Urine: 1.015 (ref 1.000–1.030)
Total Protein, Urine: NEGATIVE
Urine Glucose: NEGATIVE
Urobilinogen, UA: 0.2 (ref 0.0–1.0)
WBC, UA: NONE SEEN (ref 0–?)
pH: 7 (ref 5.0–8.0)

## 2016-06-27 NOTE — Progress Notes (Signed)
Pre visit review using our clinic review tool, if applicable. No additional management support is needed unless otherwise documented below in the visit note. 

## 2016-06-27 NOTE — Progress Notes (Signed)
[name]: CAELUM FEDERICI, male   DOB: January 03, 1926, 83 y.o.   MRN: [id number]   Subjective:    [name]: GLADYS GUTMAN, male    DOB: February 28, 1925, 83 y.o.   MRN: [id number]  HPI  Patient here for a scheduled follow up.  He is accompanied by his wife.  History obtained from both of them.  Brings in blood pressure readings.  Blood pressures averaging 120-140s/60s.  He reports still has dizziness.  Intermittently worse.  Has gone to ENT for Epley maneuvers.  Helps for a brief period and then symptoms return.  He plans to return for f/u maneuvers and to discuss more long term exercise and treatment.  He has not been doing exercise at home they showed.  Discussed the need to do these.  No chest pain.  Breathing stable.  Having some back pain.  Seeing ortho.  This does limit his activity at times.  Eating.  No nausea or vomiting.  Bowels moving.     Past Medical History:  Diagnosis Date  . Anemia    iron deficiency  . Arthritis   . Atrial fibrillation ([facility])   . Bilateral renal cysts    worked up by Dr Bernardo Heater  . BPH (benign prostatic hypertrophy)   . CAD (coronary artery disease)    s/p bypass graft x 4 (1991)  . Diverticulosis   . GERD (gastroesophageal reflux disease)   . Glaucoma   . Hypercholesterolemia   . Hypertension   . Pancreatitis    x2 (1994 and 1998), presumed to be gallstone pancreatitis.  s/p ERCP and sphincterotomy   Past Surgical History:  Procedure Laterality Date  . APPENDECTOMY    . CHOLECYSTECTOMY  1994  . CORONARY ARTERY BYPASS GRAFT     x 4,  1991  . INGUINAL HERNIA REPAIR  2009  . open heart surgery  1991   Family History  Problem Relation Age of Onset  . Throat cancer Father        oropharyngeal cancer  . Kidney disease Sister   . Heart disease Unknown   . Colon cancer Neg Hx   . Prostate cancer Neg Hx    Social History   Social History  . Marital status: Married    Spouse name: N/A  . Number of children: N/A  . Years of education: N/A    Social History Main Topics  . Smoking status: Former Smoker    Quit date: 01/14/1964  . Smokeless tobacco: Never Used  . Alcohol use No  . Drug use: No  . Sexual activity: Not Asked   Other Topics Concern  . None   Social History Narrative  . None    Outpatient Encounter Prescriptions as of 06/27/2016  Medication Sig  . amLODipine (NORVASC) 5 MG tablet TAKE 1 TABLET (5 MG TOTAL) BY MOUTH DAILY.  Marland Kitchen Ascorbic Acid (VITAMIN C PO) Take 1 tablet by mouth daily. Reported on 06/20/2015  . atorvastatin (LIPITOR) 20 MG tablet Take 20 mg by mouth at bedtime.  . Calcium Carbonate-Vitamin D (CALCIUM 600+D) 600-400 MG-UNIT per tablet Take 1 tablet by mouth daily. Reported on 06/20/2015  . citalopram (CELEXA) 20 MG tablet TAKE 1 TABLET (20 MG TOTAL) BY MOUTH DAILY.  . fluticasone (FLONASE) 50 MCG/ACT nasal spray Place 2 sprays into the nose daily.  Marland Kitchen latanoprost (XALATAN) 0.005 % ophthalmic solution Place 1 drop into both eyes at bedtime.   Marland Kitchen MAGNESIUM PO Take 1 tablet by mouth daily. Reported on 06/20/2015  .  metoprolol tartrate (LOPRESSOR) 25 MG tablet TAKE 1 TABLET BY MOUTH TWICE A DAY  . Multiple Vitamin (MULTIVITAMIN WITH MINERALS) TABS tablet Take 1 tablet by mouth daily.  . pantoprazole (PROTONIX) 40 MG tablet Take 1 tablet (40 mg total) by mouth daily.  . pantoprazole (PROTONIX) 40 MG tablet TAKE 1 TABLET BY MOUTH DAILY  . tamsulosin (FLOMAX) 0.4 MG CAPS capsule Take 0.4 mg by mouth daily after supper.   . tamsulosin (FLOMAX) 0.4 MG CAPS capsule TAKE 1 CAPSULE (0.4 MG TOTAL) BY MOUTH DAILY.  . [DISCONTINUED] metoprolol tartrate (LOPRESSOR) 25 MG tablet Take 25 mg by mouth 2 (two) times daily.   No facility-administered encounter medications on file as of 06/27/2016.     Review of Systems  Constitutional: Negative for appetite change and unexpected weight change.  HENT: Negative for congestion and sinus pressure.   Respiratory: Negative for cough, chest tightness and shortness of breath.    Cardiovascular: Negative for chest pain, palpitations and leg swelling.  Gastrointestinal: Negative for abdominal pain, diarrhea, nausea and vomiting.  Genitourinary: Negative for difficulty urinating and dysuria.  Musculoskeletal: Positive for back pain. Negative for joint swelling.  Skin: Negative for color change and rash.  Neurological: Positive for dizziness. Negative for headaches.  Psychiatric/Behavioral: Negative for agitation and dysphoric mood.       Objective:    Physical Exam  Constitutional: He appears well-developed and well-nourished. No distress.  HENT:  Nose: Nose normal.  Mouth/Throat: Oropharynx is clear and moist.  Neck: Neck supple. No thyromegaly present.  Cardiovascular: Normal rate and regular rhythm.   Pulmonary/Chest: Effort normal and breath sounds normal. No respiratory distress.  Abdominal: Soft. Bowel sounds are normal. There is no tenderness.  Musculoskeletal: He exhibits no edema or tenderness.  Lymphadenopathy:    He has no cervical adenopathy.  Skin: No rash noted. No erythema.  Psychiatric: He has a normal mood and affect. His behavior is normal.    BP 138/62   Pulse (!) 57   Temp 97.6 F (36.4 C) (Oral)   Ht 5\' 6"  (1.676 m)   Wt 149 lb 12.8 oz (67.9 kg)   SpO2 96%   BMI 24.18 kg/m  Wt Readings from Last 3 Encounters:  06/27/16 149 lb 12.8 oz (67.9 kg)  03/26/16 155 lb 6.4 oz (70.5 kg)  12/26/15 150 lb 6 oz (68.2 kg)     Lab Results  Component Value Date   WBC 6.2 01/23/2016   HGB 13.7 01/23/2016   HCT 40.5 01/23/2016   PLT 202.0 01/23/2016   GLUCOSE 105 (H) 06/25/2016   CHOL 152 06/25/2016   TRIG 74.0 06/25/2016   HDL 59.80 06/25/2016   LDLCALC 77 06/25/2016   ALT 14 06/25/2016   AST 17 06/25/2016   NA 136 06/25/2016   K 5.0 06/25/2016   CL 99 06/25/2016   CREATININE 0.92 06/25/2016   BUN 21 06/25/2016   CO2 31 06/25/2016   TSH 1.66 01/23/2016   INR 1.20 06/14/2015   HGBA1C 6.2 03/15/2012    Dg Epidural/nerve  Root  Result Date: 06/20/2016 CLINICAL DATA:  Lumbosacral spondylosis without myelopathy. Spinal stenosis. BILATERAL S1 transforaminal injections are requested. EXAM: EPIDURAL/NERVE ROOT FLUOROSCOPY TIME:  18 seconds corresponding to a Dose Area Product of 27 Gy*m2 PROCEDURE: The procedure, risks, benefits, and alternatives were explained to the patient. Questions regarding the procedure were encouraged and answered. The patient understands and consents to the procedure. BILATERAL S1 NERVE ROOT BLOCK AND TRANSFORAMINAL EPIDURAL: A posterior oblique approach was  taken to the intervertebral foramen on the RIGHT and LEFT at first sacral segment using a curved 22 gauge spinal needle. Injection of Isovue-M 200 outlined the RIGHT and LEFT S1 nerve root and showed good epidural spread. No vascular opacification is seen. 60.0 mg of Depo-Medrol mixed with 2 mL 1% lidocaine were instilled. The procedure was well-tolerated, and the patient was discharged thirty minutes following the injection in good condition. COMPLICATIONS: None IMPRESSION: Technically successful injection consisting of a BILATERAL S1 nerve root block and transforaminal epidural. Electronically Signed   By: Staci Righter M.D.   On: 06/20/2016 13:49   Dg Epidural/nerve Root  Result Date: 06/20/2016 CLINICAL DATA:  Lumbosacral spondylosis without myelopathy. Spinal stenosis. BILATERAL S1 transforaminal injections are requested. EXAM: EPIDURAL/NERVE ROOT FLUOROSCOPY TIME:  18 seconds corresponding to a Dose Area Product of 27 Gy*m2 PROCEDURE: The procedure, risks, benefits, and alternatives were explained to the patient. Questions regarding the procedure were encouraged and answered. The patient understands and consents to the procedure. BILATERAL S1 NERVE ROOT BLOCK AND TRANSFORAMINAL EPIDURAL: A posterior oblique approach was taken to the intervertebral foramen on the RIGHT and LEFT at first sacral segment using a curved 22 gauge spinal needle.  Injection of Isovue-M 200 outlined the RIGHT and LEFT S1 nerve root and showed good epidural spread. No vascular opacification is seen. 60.0 mg of Depo-Medrol mixed with 2 mL 1% lidocaine were instilled. The procedure was well-tolerated, and the patient was discharged thirty minutes following the injection in good condition. COMPLICATIONS: None IMPRESSION: Technically successful injection consisting of a BILATERAL S1 nerve root block and transforaminal epidural. Electronically Signed   By: Staci Righter M.D.   On: 06/20/2016 13:49       Assessment & Plan:   Problem List Items Addressed This Visit    Back pain    Persistent.  Seeing ortho.  S/p injection.        CAD (coronary artery disease)    Followed by cardiology.  Off coumadin as outlined.  Currently stable.  Sees Dr Ubaldo Glassing.        Relevant Medications   metoprolol tartrate (LOPRESSOR) 25 MG tablet   Chronic atrial fibrillation ([facility])    Not on coumadin now.  Had significant bleeding.  He has started back on aspirin.  Discussed taking daily.        Relevant Medications   metoprolol tartrate (LOPRESSOR) 25 MG tablet   Essential hypertension    Blood pressure as outlined.  Continue same medication regimen.  Follow pressures.  Follow metabolic panel.        Relevant Medications   metoprolol tartrate (LOPRESSOR) 25 MG tablet   Other Relevant Orders   Basic metabolic panel   Hypercholesteremia    Low cholesterol diet and exercise.  On lipitor.  Follow lipid panel and liver function tests.        Relevant Medications   metoprolol tartrate (LOPRESSOR) 25 MG tablet   Other Relevant Orders   Hepatic function panel   Lipid panel   Light headedness    Still with persistent dizziness/light headedness as outlined.  Saw ENT.  Diagnosed with BPPV - left ear.  S/p epley maneuvers.  Help for brief period.  Plans to f/u with ENT and discuss further treatment given persistent intermittent episodes.  Did encourage him to do the exercise they  gave him - at home.        Loss of weight    Weight down from last check, but stable from 12/2015 check.  Follow.  States eating well.         Other Visit Diagnoses    Abnormal urine odor    -  Primary   Check urinalysis to confirm no infection.    Relevant Orders   Urinalysis, Routine w reflex microscopic (Completed)       [name], MD

## 2016-06-29 ENCOUNTER — Encounter: Payer: Self-pay | Admitting: Internal Medicine

## 2016-06-29 NOTE — Assessment & Plan Note (Signed)
Low cholesterol diet and exercise.  On lipitor.  Follow lipid panel and liver function tests.   

## 2016-06-29 NOTE — Assessment & Plan Note (Signed)
Still with persistent dizziness/light headedness as outlined.  Saw ENT.  Diagnosed with BPPV - left ear.  S/p epley maneuvers.  Help for brief period.  Plans to f/u with ENT and discuss further treatment given persistent intermittent episodes.  Did encourage him to do the exercise they gave him - at home.

## 2016-06-29 NOTE — Assessment & Plan Note (Signed)
Blood pressure as outlined.  Continue same medication regimen.  Follow pressures.  Follow metabolic panel.  

## 2016-06-29 NOTE — Assessment & Plan Note (Signed)
Followed by cardiology.  Off coumadin as outlined.  Currently stable.  Sees Dr Ubaldo Glassing.

## 2016-06-29 NOTE — Assessment & Plan Note (Signed)
Not on coumadin now.  Had significant bleeding.  He has started back on aspirin.  Discussed taking daily.

## 2016-06-29 NOTE — Assessment & Plan Note (Signed)
Weight down from last check, but stable from 12/2015 check.  Follow.  States eating well.

## 2016-06-29 NOTE — Assessment & Plan Note (Signed)
Persistent.  Seeing ortho.  S/p injection.

## 2016-07-06 ENCOUNTER — Other Ambulatory Visit: Payer: Self-pay

## 2016-07-06 ENCOUNTER — Emergency Department
Admission: EM | Admit: 2016-07-06 | Discharge: 2016-07-06 | Disposition: A | Discharge status: Home / Self Care | Payer: Medicare HMO | Attending: Student in an Organized Health Care Education/Training Program | Admitting: Student in an Organized Health Care Education/Training Program

## 2016-07-06 ENCOUNTER — Emergency Department: Payer: Medicare HMO

## 2016-07-06 ENCOUNTER — Encounter: Payer: Self-pay | Admitting: Emergency Medicine

## 2016-07-06 DIAGNOSIS — Z7982 Long term (current) use of aspirin: Secondary | ICD-10-CM | POA: Insufficient documentation

## 2016-07-06 DIAGNOSIS — I4891 Unspecified atrial fibrillation: Secondary | ICD-10-CM | POA: Diagnosis not present

## 2016-07-06 DIAGNOSIS — I10 Essential (primary) hypertension: Secondary | ICD-10-CM | POA: Diagnosis not present

## 2016-07-06 DIAGNOSIS — Z79899 Other long term (current) drug therapy: Secondary | ICD-10-CM | POA: Diagnosis not present

## 2016-07-06 DIAGNOSIS — I2510 Atherosclerotic heart disease of native coronary artery without angina pectoris: Secondary | ICD-10-CM | POA: Insufficient documentation

## 2016-07-06 DIAGNOSIS — E876 Hypokalemia: Secondary | ICD-10-CM

## 2016-07-06 DIAGNOSIS — R05 Cough: Secondary | ICD-10-CM | POA: Insufficient documentation

## 2016-07-06 DIAGNOSIS — R059 Cough, unspecified: Secondary | ICD-10-CM

## 2016-07-06 DIAGNOSIS — Z87891 Personal history of nicotine dependence: Secondary | ICD-10-CM | POA: Diagnosis not present

## 2016-07-06 LAB — CBC WITH DIFFERENTIAL/PLATELET
BASOS ABS: 0 10*3/uL (ref 0–0.1)
Basophils Relative: 0 %
EOS PCT: 1 %
Eosinophils Absolute: 0.1 10*3/uL (ref 0–0.7)
HCT: 38.3 % — ABNORMAL LOW (ref 40.0–52.0)
Hemoglobin: 13.1 g/dL (ref 13.0–18.0)
LYMPHS PCT: 8 %
Lymphs Abs: 0.7 10*3/uL — ABNORMAL LOW (ref 1.0–3.6)
MCH: 32.9 pg (ref 26.0–34.0)
MCHC: 34.2 g/dL (ref 32.0–36.0)
MCV: 96 fL (ref 80.0–100.0)
Monocytes Absolute: 0.9 10*3/uL (ref 0.2–1.0)
Monocytes Relative: 11 %
NEUTROS ABS: 6.8 10*3/uL — AB (ref 1.4–6.5)
NEUTROS PCT: 80 %
PLATELETS: 153 10*3/uL (ref 150–440)
RBC: 3.99 MIL/uL — AB (ref 4.40–5.90)
RDW: 14.2 % (ref 11.5–14.5)
WBC: 8.5 10*3/uL (ref 3.8–10.6)

## 2016-07-06 LAB — BASIC METABOLIC PANEL
ANION GAP: 4 — AB (ref 5–15)
ANION GAP: 10 (ref 5–15)
BUN: 13 mg/dL (ref 6–20)
BUN: 14 mg/dL (ref 6–20)
CO2: 19 mmol/L — ABNORMAL LOW (ref 22–32)
CO2: 26 mmol/L (ref 22–32)
Calcium: 5.7 mg/dL — CL (ref 8.9–10.3)
Calcium: 8.9 mg/dL (ref 8.9–10.3)
Chloride: 114 mmol/L — ABNORMAL HIGH (ref 101–111)
Chloride: 99 mmol/L — ABNORMAL LOW (ref 101–111)
Creatinine, Ser: 0.48 mg/dL — ABNORMAL LOW (ref 0.61–1.24)
Creatinine, Ser: 0.65 mg/dL (ref 0.61–1.24)
GFR calc Af Amer: >60 mL/min (ref >60)
GFR calc Af Amer: >60 mL/min (ref >60)
GLUCOSE: 77 mg/dL (ref 65–99)
Glucose, Bld: 111 mg/dL — ABNORMAL HIGH (ref 65–99)
POTASSIUM: 2.7 mmol/L — AB (ref 3.5–5.1)
POTASSIUM: 5.2 mmol/L — AB (ref 3.5–5.1)
SODIUM: 135 mmol/L (ref 135–145)
Sodium: 137 mmol/L (ref 135–145)

## 2016-07-06 MED ORDER — MAGNESIUM SULFATE 2 GM/50ML IV SOLN
2.0000 g | Freq: Once | INTRAVENOUS | Status: AC
  Administered 2016-07-06: 2 g via INTRAVENOUS
  Filled 2016-07-06: qty 50

## 2016-07-06 MED ORDER — POTASSIUM CHLORIDE 20 MEQ PO PACK
40.0000 meq | PACK | Freq: Once | ORAL | Status: AC
Start: 2016-07-06 — End: 2016-07-06
  Administered 2016-07-06: 40 meq via ORAL

## 2016-07-06 MED ORDER — CALCIUM CARBONATE ANTACID 500 MG PO CHEW: 1.0000 | CHEWABLE_TABLET | Freq: Every day | ORAL | 3 refills | Status: AC

## 2016-07-06 MED ORDER — SODIUM CHLORIDE 0.9 % IV SOLN
1.0000 g | Freq: Once | INTRAVENOUS | Status: AC
  Administered 2016-07-06: 1 g via INTRAVENOUS
  Filled 2016-07-06: qty 10

## 2016-07-06 MED ORDER — CALCIUM CARBONATE ANTACID 500 MG PO CHEW
1.0000 | CHEWABLE_TABLET | Freq: Once | ORAL | Status: AC
Start: 2016-07-06 — End: 2016-07-06
  Administered 2016-07-06: 200 mg via ORAL
  Filled 2016-07-06: qty 1

## 2016-07-06 MED ORDER — DOXYCYCLINE HYCLATE 50 MG PO CAPS: 100.0000 mg | ORAL_CAPSULE | Freq: Two times a day (BID) | ORAL | 0 refills | Status: AC

## 2016-07-06 MED ORDER — SODIUM CHLORIDE 0.9 % IV BOLUS (SEPSIS)
1000.0000 mL | Freq: Once | INTRAVENOUS | Status: AC
  Administered 2016-07-06: 1000 mL via INTRAVENOUS

## 2016-07-06 MED ORDER — ALBUTEROL SULFATE HFA 108 (90 BASE) MCG/ACT IN AERS: 2.0000 | INHALATION_SPRAY | Freq: Four times a day (QID) | RESPIRATORY_TRACT | 2 refills | Status: DC | PRN

## 2016-07-06 MED ORDER — POTASSIUM CHLORIDE 20 MEQ PO PACK
PACK | ORAL | Status: AC
  Filled 2016-07-06: qty 2

## 2016-07-06 MED ORDER — POTASSIUM CHLORIDE CRYS ER 20 MEQ PO TBCR: 40.0000 meq | EXTENDED_RELEASE_TABLET | Freq: Once | ORAL | Status: DC

## 2016-07-06 MED ORDER — DOXYCYCLINE HYCLATE 100 MG PO TABS
100.0000 mg | ORAL_TABLET | Freq: Once | ORAL | Status: AC
  Administered 2016-07-06: 100 mg via ORAL
  Filled 2016-07-06: qty 1

## 2016-07-06 MED ORDER — IPRATROPIUM-ALBUTEROL 0.5-2.5 (3) MG/3ML IN SOLN
3.0000 mL | Freq: Once | RESPIRATORY_TRACT | Status: AC
Start: 2016-07-06 — End: 2016-07-06
  Administered 2016-07-06: 3 mL via RESPIRATORY_TRACT
  Filled 2016-07-06: qty 3

## 2016-07-06 NOTE — ED Triage Notes (Signed)
Pt with productive cough started yesterday, green in color. Pt denies any pain, shortness and denies any chest pain. No fever or chills.

## 2016-07-06 NOTE — ED Notes (Signed)
See triage note. States he developed a prod cough about 1` day ago   Cough is prod   Yellowish /greenish phlegm  Unsure of fever and denies any other sx's

## 2016-07-06 NOTE — ED Notes (Addendum)
Dr Quentin Cornwall informed of critical Ca and K - see new orders

## 2016-07-06 NOTE — ED Notes (Signed)
Pt ambulated without difficulty and O2 sat stayed within 95-96% - Dr Quentin Cornwall was made aware

## 2016-07-06 NOTE — ED Provider Notes (Signed)
[facility] Emergency Department Provider Note    First MD Initiated Contact with Patient 07/06/16 1101     (approximate)  I have reviewed the triage vital signs and the nursing notes.   HISTORY  Chief Complaint Cough    HPI Tyler Stout is a 83 y.o. male who presents with chief complaint of cough that started last night.   No fevers. States that cough is productive of yellow-green sputum. Denies any chest pain. No orthopnea. No worsening shortness of breath. Does have a significant history of CAD but has not had any significant issues recently. No recent hospitalizations. Patient does complain of sore throat after frequent coughing and not being able to sleep last night.   Past Medical History:  Diagnosis Date  . Anemia    iron deficiency  . Arthritis   . Atrial fibrillation ([place])   . Bilateral renal cysts    worked up by Dr Bernardo Heater  . BPH (benign prostatic hypertrophy)   . CAD (coronary artery disease)    s/p bypass graft x 4 (1991)  . Diverticulosis   . GERD (gastroesophageal reflux disease)   . Glaucoma   . Hypercholesterolemia   . Hypertension   . Pancreatitis    x2 (1994 and 1998), presumed to be gallstone pancreatitis.  s/p ERCP and sphincterotomy   Family History  Problem Relation Age of Onset  . Throat cancer Father        oropharyngeal cancer  . Kidney disease Sister   . Heart disease Unknown   . Colon cancer Neg Hx   . Prostate cancer Neg Hx    Past Surgical History:  Procedure Laterality Date  . APPENDECTOMY    . CHOLECYSTECTOMY  1994  . CORONARY ARTERY BYPASS GRAFT     x 4,  1991  . INGUINAL HERNIA REPAIR  2009  . open heart surgery  1991   Patient Active Problem List   Diagnosis Date Noted  . Urinary frequency 06/20/2015  . Hemorrhagic shock ([facility])   . Lower GI bleed   . Acute blood loss anemia   . Elevated troponin   . Demand ischemia ([facility])   . Chronic atrial fibrillation ([place])   . Essential hypertension   .  HLD (hyperlipidemia)   . BPH (benign prostatic hyperplasia)   . Tremor 06/10/2015  . Loss of weight 06/10/2015  . GIB (gastrointestinal bleeding) 06/09/2015  . Rectal bleed 06/08/2015  . GI bleed 06/08/2015  . Bleeding gastrointestinal   . Arterial hypotension   . Health care maintenance 08/11/2014  . Light headedness 03/19/2014  . Sinus congestion 08/13/2013  . Nausea 05/09/2013  . Back pain 03/13/2013  . Hypokalemia 02/20/2013  . Diarrhea 02/20/2013  . CAD (coronary artery disease) 12/07/2011  . Hyponatremia 12/07/2011  . Atrial fibrillation ([place]) 12/07/2011  . Hypertension 11/20/2011  . Hypercholesteremia 11/20/2011  . Anemia 11/20/2011      Prior to Admission medications   Medication Sig Start Date End Date Taking? Authorizing Provider  amLODipine (NORVASC) 5 MG tablet TAKE 1 TABLET (5 MG TOTAL) BY MOUTH DAILY. 01/15/16  Yes [name], MD  Ascorbic Acid (VITAMIN C PO) Take 1 tablet by mouth daily. Reported on 06/20/2015   Yes [provider]  aspirin EC 81 MG tablet Take 81 mg by mouth daily.   Yes [provider]  atorvastatin (LIPITOR) 20 MG tablet Take 20 mg by mouth at bedtime.   Yes [provider]  citalopram (CELEXA) 20 MG tablet  TAKE 1 TABLET (20 MG TOTAL) BY MOUTH DAILY. 04/09/16  Yes [name], MD  fluticasone (FLONASE) 50 MCG/ACT nasal spray Place 2 sprays into the nose daily.   Yes [provider]  furosemide (LASIX) 20 MG tablet Take 20 mg by mouth daily as needed.   Yes [provider]  latanoprost (XALATAN) 0.005 % ophthalmic solution Place 1 drop into both eyes at bedtime.    Yes [provider]  MAGNESIUM PO Take 1 tablet by mouth daily. Reported on 06/20/2015   Yes [provider]  metoprolol tartrate (LOPRESSOR) 25 MG tablet TAKE 1 TABLET BY MOUTH TWICE A DAY 05/27/16  Yes [provider]  Multiple Vitamin (MULTIVITAMIN WITH MINERALS) TABS tablet Take 1 tablet by mouth daily.   Yes  [provider]  pantoprazole (PROTONIX) 40 MG tablet Take 1 tablet (40 mg total) by mouth daily. 11/21/15  Yes [name], MD  tamsulosin (FLOMAX) 0.4 MG CAPS capsule TAKE 1 CAPSULE (0.4 MG TOTAL) BY MOUTH DAILY. 04/21/16  Yes [name], MD  albuterol (PROVENTIL HFA;VENTOLIN HFA) 108 (90 Base) MCG/ACT inhaler Inhale 2 puffs into the lungs every 6 (six) hours as needed for wheezing or shortness of breath. 07/06/16   Merlyn Lot, MD  calcium carbonate (TUMS) 500 MG chewable tablet Chew 1 tablet (200 mg of elemental calcium total) by mouth daily. 07/06/16 07/06/17  Merlyn Lot, MD  doxycycline (VIBRAMYCIN) 50 MG capsule Take 2 capsules (100 mg total) by mouth 2 (two) times daily. 07/06/16 07/13/16  Merlyn Lot, MD    Allergies Ilosone [erythromycin] and Penicillins    Social History Social History  Substance Use Topics  . Smoking status: Former Smoker    Quit date: 01/14/1964  . Smokeless tobacco: Never Used  . Alcohol use No    Review of Systems Patient denies headaches, rhinorrhea, blurry vision, numbness, shortness of breath, chest pain, edema, cough, abdominal pain, nausea, vomiting, diarrhea, dysuria, fevers, rashes or hallucinations unless otherwise stated above in HPI. ____________________________________________   PHYSICAL EXAM:  VITAL SIGNS: Vitals:   07/06/16 1500 07/06/16 1530  BP: 130/69 (!) 151/63  Pulse: 70   Resp: 20 (!) 21  Temp:      Constitutional: Alert and oriented.  in no acute distress. Eyes: Conjunctivae are normal.  Head: Atraumatic. Nose: No congestion/rhinnorhea. Mouth/Throat: Mucous membranes are moist.   Neck: No stridor. Painless ROM.  Cardiovascular: Normal rate, regular rhythm. Grossly normal heart sounds.  Good peripheral circulation. Respiratory: Normal respiratory effort.  No retractions. Lungs CTAB. Gastrointestinal: Soft and nontender. No distention. No abdominal bruits. No CVA tenderness. Musculoskeletal: No  lower extremity tenderness nor edema.  No joint effusions. Neurologic:  Normal speech and language. No gross focal neurologic deficits are appreciated. No facial droop Skin:  Skin is warm, dry and intact. No rash noted. Psychiatric: Mood and affect are normal. Speech and behavior are normal.  ____________________________________________   LABS (all labs ordered are listed, but only abnormal results are displayed)  Results for orders placed or performed during the hospital encounter of 07/06/16 (from the past 24 hour(s))  CBC with Differential/Platelet     Status: Abnormal   Collection Time: 07/06/16 11:28 AM  Result Value Ref Range   WBC 8.5 3.8 - 10.6 K/uL   RBC 3.99 (L) 4.40 - 5.90 MIL/uL   Hemoglobin 13.1 13.0 - 18.0 g/dL   HCT 38.3 (L) 40.0 - 52.0 %   MCV 96.0 80.0 - 100.0 fL   MCH 32.9 26.0 - 34.0 pg  MCHC 34.2 32.0 - 36.0 g/dL   RDW 14.2 11.5 - 14.5 %   Platelets 153 150 - 440 K/uL   Neutrophils Relative % 80 %   Neutro Abs 6.8 (H) 1.4 - 6.5 K/uL   Lymphocytes Relative 8 %   Lymphs Abs 0.7 (L) 1.0 - 3.6 K/uL   Monocytes Relative 11 %   Monocytes Absolute 0.9 0.2 - 1.0 K/uL   Eosinophils Relative 1 %   Eosinophils Absolute 0.1 0 - 0.7 K/uL   Basophils Relative 0 %   Basophils Absolute 0.0 0 - 0.1 K/uL  Basic metabolic panel     Status: Abnormal   Collection Time: 07/06/16 11:28 AM  Result Value Ref Range   Sodium 137 135 - 145 mmol/L   Potassium 2.7 (LL) 3.5 - 5.1 mmol/L   Chloride 114 (H) 101 - 111 mmol/L   CO2 19 (L) 22 - 32 mmol/L   Glucose, Bld 77 65 - 99 mg/dL   BUN 13 6 - 20 mg/dL   Creatinine, Ser 0.48 (L) 0.61 - 1.24 mg/dL   Calcium 5.7 (LL) 8.9 - 10.3 mg/dL   GFR calc non Af Amer >60 >60 mL/min   GFR calc Af Amer >60 >60 mL/min   Anion gap 4 (L) 5 - 15  Basic metabolic panel     Status: Abnormal   Collection Time: 07/06/16  4:00 PM  Result Value Ref Range   Sodium 135 135 - 145 mmol/L   Potassium 5.2 (H) 3.5 - 5.1 mmol/L   Chloride 99 (L) 101 - 111  mmol/L   CO2 26 22 - 32 mmol/L   Glucose, Bld 111 (H) 65 - 99 mg/dL   BUN 14 6 - 20 mg/dL   Creatinine, Ser 0.65 0.61 - 1.24 mg/dL   Calcium 8.9 8.9 - 10.3 mg/dL   GFR calc non Af Amer >60 >60 mL/min   GFR calc Af Amer >60 >60 mL/min   Anion gap 10 5 - 15   ____________________________________________  EKG My review and personal interpretation at Time: 11:31   Indication: cough  Rate: 55  Rhythm: sinus with 1st degree av block Axis: left Other: IVCD no STEMI, o therwise normal intervals ____________________________________________  RADIOLOGY  I personally reviewed all radiographic images ordered to evaluate for the above acute complaints and reviewed radiology reports and findings.  These findings were personally discussed with the patient.  Please see medical record for radiology report.  ____________________________________________   PROCEDURES  Procedure(s) performed:  Procedures    Critical Care performed: no ____________________________________________   INITIAL IMPRESSION / ASSESSMENT AND PLAN / ED COURSE  Pertinent labs & imaging results that were available during my care of the patient were reviewed by me and considered in my medical decision making (see chart for details).  DDX: pha, chf, copd, electrolye abn  RAYNOLD BLANKENBAKER is a 83 y.o. who presents to the ED with Coughing above complaints. Patient otherwise well appearing and in no acute distress. Less consistent with ACS or heart failure based on exam. Do suspect some component of bronchitis or pneumonia. We'll order chest x-ray to evaluate for infiltrate  The patient will be placed on continuous pulse oximetry and telemetry for monitoring.  Laboratory evaluation will be sent to evaluate for the above complaints.     Clinical Course as of Jul 07 2006  Sun Jul 06, 2016  1220 Lymphocyte #: (!) 0.7 [PR]  1224 Patient with low measured calcium normal renal function. Mild metabolic acidosis  and low potassium.  We'll replete with both oral and IV calcium, magnesium and oral potassium. Patient otherwise well appearing clinically. We will give dose of doxycycline for productive cough. Patient without any hypoxia. Will continue to monitor.  [PR]  1612 Patient ambulated with no distress. No desaturations.  [PR]    Clinical Course User Index [PR] Merlyn Lot, MD   Repeat blood work shows interval improvement and correction of his electrolyte imbalance. This point patient remains a symptomatically and is requesting discharge home. I do feel this is appropriate as he is tolerating oral hydration and tolerating oral antibiotics.  ____________________________________________   FINAL CLINICAL IMPRESSION(S) / ED DIAGNOSES  Final diagnoses:  Cough  Hypocalcemia  Hypokalemia      NEW MEDICATIONS STARTED DURING THIS VISIT:  Discharge Medication List as of 07/06/2016  4:58 PM    START taking these medications   Details  albuterol (PROVENTIL HFA;VENTOLIN HFA) 108 (90 Base) MCG/ACT inhaler Inhale 2 puffs into the lungs every 6 (six) hours as needed for wheezing or shortness of breath., Starting Sun 07/06/2016, Print    calcium carbonate (TUMS) 500 MG chewable tablet Chew 1 tablet (200 mg of elemental calcium total) by mouth daily., Starting Sun 07/06/2016, Until Mon 07/06/2017, Print    doxycycline (VIBRAMYCIN) 50 MG capsule Take 2 capsules (100 mg total) by mouth 2 (two) times daily., Starting Sun 07/06/2016, Until Sun 07/13/2016, Print         Note:  This document was prepared using Dragon voice recognition software and may include unintentional dictation errors.    Merlyn Lot, MD 07/06/16 2008

## 2016-07-10 ENCOUNTER — Other Ambulatory Visit: Payer: Self-pay | Admitting: Internal Medicine

## 2016-07-15 ENCOUNTER — Encounter: Payer: Self-pay | Admitting: Internal Medicine

## 2016-07-15 ENCOUNTER — Ambulatory Visit (INDEPENDENT_AMBULATORY_CARE_PROVIDER_SITE_OTHER): Payer: Medicare HMO | Admitting: Internal Medicine

## 2016-07-15 VITALS — BP 140/68 | HR 100 | Temp 98.7°F | Resp 12 | Ht 66.0 in | Wt 149.0 lb

## 2016-07-15 DIAGNOSIS — R42 Dizziness and giddiness: Secondary | ICD-10-CM | POA: Diagnosis not present

## 2016-07-15 DIAGNOSIS — I482 Chronic atrial fibrillation, unspecified: Secondary | ICD-10-CM

## 2016-07-15 DIAGNOSIS — R059 Cough, unspecified: Secondary | ICD-10-CM

## 2016-07-15 DIAGNOSIS — E7800 Pure hypercholesterolemia, unspecified: Secondary | ICD-10-CM

## 2016-07-15 DIAGNOSIS — E871 Hypo-osmolality and hyponatremia: Secondary | ICD-10-CM | POA: Diagnosis not present

## 2016-07-15 DIAGNOSIS — R05 Cough: Secondary | ICD-10-CM

## 2016-07-15 DIAGNOSIS — I10 Essential (primary) hypertension: Secondary | ICD-10-CM

## 2016-07-15 DIAGNOSIS — R938 Abnormal findings on diagnostic imaging of other specified body structures: Secondary | ICD-10-CM | POA: Diagnosis not present

## 2016-07-15 DIAGNOSIS — I2510 Atherosclerotic heart disease of native coronary artery without angina pectoris: Secondary | ICD-10-CM

## 2016-07-15 DIAGNOSIS — R9389 Abnormal findings on diagnostic imaging of other specified body structures: Secondary | ICD-10-CM

## 2016-07-15 LAB — BASIC METABOLIC PANEL
BUN: 16 mg/dL (ref 7–25)
CO2: 27 mmol/L (ref 20–31)
Calcium: 9 mg/dL (ref 8.6–10.3)
Chloride: 99 mmol/L (ref 98–110)
Creat: 0.92 mg/dL (ref 0.70–1.11)
GLUCOSE: 92 mg/dL (ref 65–99)
Potassium: 5.1 mmol/L (ref 3.5–5.3)
SODIUM: 134 mmol/L — AB (ref 135–146)

## 2016-07-15 NOTE — Progress Notes (Signed)
[name]: Tyler Stout, male   DOB: 1925-06-29, 83 y.o.   MRN: [id number]   Subjective:    [name]: Tyler Stout, male    DOB: [birth year], 83 y.o.   MRN: [id number]  HPI  Patient here for ER follow up.  He is accompanied by his wife.  History obtained from both of them.  Was seen on 07/06/16 for persistent cough.  Notes reviewed.  Had cough - productive of yellow green sputum.  Was placed on doxycycline.  cxr revealed no acute cardiopulmonary disease.  Did report symmetric small round densities felt to like be nipples.  See report.  He states he is doing better.  Wife reports cough is much better.  Still some congestion,but overall improved.  Still with dizziness.  This is chronic.  No headache.  No chest pain.  Describes some increased fatigue and decreased energy.  Eating.  No nausea or vomiting.  Bowels moving.  Found to have electrolyte abnormality in ER.  Corrected some with replacement.  Outside blood pressure checks averaging 130-140s/60-70.     Past Medical History:  Diagnosis Date  . Anemia    iron deficiency  . Arthritis   . Atrial fibrillation ([place])   . Bilateral renal cysts    worked up by Dr Bernardo Heater  . BPH (benign prostatic hypertrophy)   . CAD (coronary artery disease)    s/p bypass graft x 4 (1991)  . Diverticulosis   . GERD (gastroesophageal reflux disease)   . Glaucoma   . Hypercholesterolemia   . Hypertension   . Pancreatitis    x2 (1994 and 1998), presumed to be gallstone pancreatitis.  s/p ERCP and sphincterotomy   Past Surgical History:  Procedure Laterality Date  . APPENDECTOMY    . CHOLECYSTECTOMY  1994  . CORONARY ARTERY BYPASS GRAFT     x 4,  1991  . INGUINAL HERNIA REPAIR  2009  . open heart surgery  1991   Family History  Problem Relation Age of Onset  . Throat cancer Father        oropharyngeal cancer  . Kidney disease Sister   . Heart disease Unknown   . Colon cancer Neg Hx   . Prostate cancer Neg Hx    Social History   Social  History  . Marital status: Married    Spouse name: N/A  . Number of children: N/A  . Years of education: N/A   Social History Main Topics  . Smoking status: Former Smoker    Quit date: 01/14/1964  . Smokeless tobacco: Never Used  . Alcohol use No  . Drug use: No  . Sexual activity: Not Asked   Other Topics Concern  . None   Social History Narrative  . None    Outpatient Encounter Prescriptions as of 07/15/2016  Medication Sig  . albuterol (PROVENTIL HFA;VENTOLIN HFA) 108 (90 Base) MCG/ACT inhaler Inhale 2 puffs into the lungs every 6 (six) hours as needed for wheezing or shortness of breath.  Marland Kitchen amLODipine (NORVASC) 5 MG tablet TAKE 1 TABLET (5 MG TOTAL) BY MOUTH DAILY.  Marland Kitchen Ascorbic Acid (VITAMIN C PO) Take 1 tablet by mouth daily. Reported on 06/20/2015  . aspirin EC 81 MG tablet Take 81 mg by mouth daily.  Marland Kitchen atorvastatin (LIPITOR) 20 MG tablet Take 20 mg by mouth at bedtime.  . calcium carbonate (TUMS) 500 MG chewable tablet Chew 1 tablet (200 mg of elemental calcium total) by mouth daily.  . citalopram (CELEXA) 20 MG  tablet TAKE 1 TABLET (20 MG TOTAL) BY MOUTH DAILY.  . fluticasone (FLONASE) 50 MCG/ACT nasal spray Place 2 sprays into the nose daily.  . furosemide (LASIX) 20 MG tablet Take 20 mg by mouth daily as needed.  . latanoprost (XALATAN) 0.005 % ophthalmic solution Place 1 drop into both eyes at bedtime.   Marland Kitchen MAGNESIUM PO Take 1 tablet by mouth daily. Reported on 06/20/2015  . metoprolol tartrate (LOPRESSOR) 25 MG tablet TAKE 1 TABLET BY MOUTH TWICE A DAY  . Multiple Vitamin (MULTIVITAMIN WITH MINERALS) TABS tablet Take 1 tablet by mouth daily.  . pantoprazole (PROTONIX) 40 MG tablet Take 1 tablet (40 mg total) by mouth daily.  . tamsulosin (FLOMAX) 0.4 MG CAPS capsule TAKE 1 CAPSULE (0.4 MG TOTAL) BY MOUTH DAILY.   No facility-administered encounter medications on file as of 07/15/2016.     Review of Systems  Constitutional: Positive for fatigue. Negative for appetite change  and unexpected weight change.  HENT: Positive for congestion. Negative for sinus pressure.   Respiratory: Positive for cough. Negative for chest tightness and shortness of breath.   Cardiovascular: Negative for chest pain, palpitations and leg swelling.  Gastrointestinal: Negative for abdominal pain, diarrhea, nausea and vomiting.  Genitourinary: Negative for difficulty urinating and dysuria.  Musculoskeletal: Negative for joint swelling and myalgias.  Skin: Negative for color change and rash.  Neurological: Positive for dizziness. Negative for headaches.  Psychiatric/Behavioral: Negative for agitation and dysphoric mood.       Objective:    Physical Exam  Constitutional: He appears well-developed and well-nourished. No distress.  HENT:  Nose: Nose normal.  Mouth/Throat: Oropharynx is clear and moist.  Neck: Neck supple. No thyromegaly present.  Cardiovascular: Normal rate and regular rhythm.   Pulmonary/Chest: Effort normal and breath sounds normal. No respiratory distress.  Abdominal: Soft. Bowel sounds are normal. There is no tenderness.  Musculoskeletal: He exhibits no edema or tenderness.  Lymphadenopathy:    He has no cervical adenopathy.  Skin: No rash noted. No erythema.  Psychiatric: He has a normal mood and affect. His behavior is normal.    BP 140/68 (BP Location: Left Arm, Patient Position: Sitting, Cuff Size: Normal)   Pulse 100   Temp 98.7 F (37.1 C) (Oral)   Resp 12   Ht 5\' 6"  (1.676 m)   Wt 149 lb (67.6 kg)   SpO2 97%   BMI 24.05 kg/m  Wt Readings from Last 3 Encounters:  07/15/16 149 lb (67.6 kg)  07/06/16 149 lb (67.6 kg)  06/27/16 149 lb 12.8 oz (67.9 kg)     Lab Results  Component Value Date   WBC 8.5 07/06/2016   HGB 13.1 07/06/2016   HCT 38.3 (L) 07/06/2016   PLT 153 07/06/2016   GLUCOSE 92 07/15/2016   CHOL 152 06/25/2016   TRIG 74.0 06/25/2016   HDL 59.80 06/25/2016   LDLCALC 77 06/25/2016   ALT 14 06/25/2016   AST 17 06/25/2016    NA 134 (L) 07/15/2016   K 5.1 07/15/2016   CL 99 07/15/2016   CREATININE 0.92 07/15/2016   BUN 16 07/15/2016   CO2 27 07/15/2016   TSH 1.66 01/23/2016   INR 1.20 06/14/2015   HGBA1C 6.2 03/15/2012    Dg Chest 2 View  Result Date: 07/06/2016 CLINICAL DATA:  Productive cough beginning yesterday. EXAM: CHEST  2 VIEW COMPARISON:  06/05/2015 FINDINGS: Sternotomy wires unchanged. Lungs are adequately inflated without focal airspace consolidation or effusion. Calcification along the left hemidiaphragm unchanged. Symmetric  small round nodular densities over the lung bases likely the nipples. Stable borderline cardiomegaly. Calcified plaque over the thoracic aorta. Mild degenerate change of the spine. Flattening of the hemidiaphragms on the lateral film. IMPRESSION: No acute cardiopulmonary disease. Stable pleural calcification of the left hemidiaphragm. Symmetric small round nodular densities likely be nipples projected over the lung bases. Recommend follow-up PA chest radiograph with nipple markers for confirmation. Electronically Signed   By: Marin Olp M.D.   On: 07/06/2016 10:06       Assessment & Plan:   Problem List Items Addressed This Visit    Abnormal CXR    cxr as outlined.  Unable to repeat cxr today.  Will return for f/u cxr.  Clinically improved.        CAD (coronary artery disease)    Followed by cardiollogy.  Stable.        Chronic atrial fibrillation (HCC)    Off coumadin secondary to increased bleeding.  See previous notes.  On aspirin.  Follow. Sees cardiology.  Stable.       Hypercholesteremia    On lipitor.  Follow lipid panel and liver function tests.        Hypertension    Blood pressure under reasonable control.  Follow pressures.  Same medication regimen.  Follow.        Hyponatremia - Primary    Recheck metabolic panel to confirm stable.        Relevant Orders   Basic metabolic panel (Completed)   Light headedness    Still with dizziness/light  headedness as outlined.  See previous notes.  Has seen ENT.  Diagnosed with BPPV - left ear.  S/p epley maneuvers.  Helped for a brief period.  Discussed with him today.  F/u with ENT.  Also, slow position changes and movements.  Follow.         Other Visit Diagnoses    Cough       recently evaluated.  treated with doxycycline.  cough better.  congestion better.  will need f/u cxr.         [name], MD

## 2016-07-15 NOTE — Progress Notes (Signed)
Pre-visit discussion using our clinic review tool. No additional management support is needed unless otherwise documented below in the visit note.  

## 2016-07-18 ENCOUNTER — Encounter: Payer: Self-pay | Admitting: Internal Medicine

## 2016-07-18 ENCOUNTER — Telehealth: Payer: Self-pay

## 2016-07-18 ENCOUNTER — Other Ambulatory Visit: Payer: Self-pay | Admitting: Internal Medicine

## 2016-07-18 DIAGNOSIS — R9389 Abnormal findings on diagnostic imaging of other specified body structures: Secondary | ICD-10-CM

## 2016-07-18 DIAGNOSIS — D72810 Lymphocytopenia: Secondary | ICD-10-CM

## 2016-07-18 DIAGNOSIS — E871 Hypo-osmolality and hyponatremia: Secondary | ICD-10-CM

## 2016-07-18 NOTE — Assessment & Plan Note (Signed)
Followed by cardiollogy.  Stable.

## 2016-07-18 NOTE — Assessment & Plan Note (Signed)
cxr as outlined.  Unable to repeat cxr today.  Will return for f/u cxr.  Clinically improved.

## 2016-07-18 NOTE — Assessment & Plan Note (Signed)
Blood pressure under reasonable control.  Follow pressures.  Same medication regimen.  Follow.   

## 2016-07-18 NOTE — Assessment & Plan Note (Signed)
On lipitor.  Follow lipid panel and liver function tests.   

## 2016-07-18 NOTE — Telephone Encounter (Signed)
-----   Message from [name], MD sent at 07/18/2016  5:03 AM EDT ----- Regarding: follow up Would like for pt to return for f/u cxr in 3-4 weeks.  Also would like to recheck cbc.  Please notify his wife and schedule.  Thanks.    Dr Nicki Reaper

## 2016-07-18 NOTE — Assessment & Plan Note (Signed)
Still with dizziness/light headedness as outlined.  See previous notes.  Has seen ENT.  Diagnosed with BPPV - left ear.  S/p epley maneuvers.  Helped for a brief period.  Discussed with him today.  F/u with ENT.  Also, slow position changes and movements.  Follow.

## 2016-07-18 NOTE — Progress Notes (Signed)
Orders placed for f/u labs and cxr.

## 2016-07-18 NOTE — Assessment & Plan Note (Signed)
Off coumadin secondary to increased bleeding.  See previous notes.  On aspirin.  Follow. Sees cardiology.  Stable.

## 2016-07-18 NOTE — Assessment & Plan Note (Signed)
Recheck metabolic panel to confirm stable.

## 2016-07-18 NOTE — Telephone Encounter (Signed)
Left message to return call to our office.  

## 2016-07-21 NOTE — Telephone Encounter (Signed)
Spoke to wife gave all information app has been made.

## 2016-08-07 ENCOUNTER — Other Ambulatory Visit: Payer: Self-pay | Admitting: Internal Medicine

## 2016-08-11 ENCOUNTER — Other Ambulatory Visit (INDEPENDENT_AMBULATORY_CARE_PROVIDER_SITE_OTHER): Payer: Medicare HMO

## 2016-08-11 ENCOUNTER — Ambulatory Visit (INDEPENDENT_AMBULATORY_CARE_PROVIDER_SITE_OTHER): Payer: Medicare HMO

## 2016-08-11 DIAGNOSIS — R938 Abnormal findings on diagnostic imaging of other specified body structures: Secondary | ICD-10-CM | POA: Diagnosis not present

## 2016-08-11 DIAGNOSIS — E871 Hypo-osmolality and hyponatremia: Secondary | ICD-10-CM | POA: Diagnosis not present

## 2016-08-11 DIAGNOSIS — R918 Other nonspecific abnormal finding of lung field: Secondary | ICD-10-CM | POA: Diagnosis not present

## 2016-08-11 DIAGNOSIS — D72810 Lymphocytopenia: Secondary | ICD-10-CM

## 2016-08-11 DIAGNOSIS — R9389 Abnormal findings on diagnostic imaging of other specified body structures: Secondary | ICD-10-CM

## 2016-08-11 LAB — CBC WITH DIFFERENTIAL/PLATELET
BASOS ABS: 0 10*3/uL (ref 0.0–0.1)
Basophils Relative: 0.7 % (ref 0.0–3.0)
Eosinophils Absolute: 0.1 10*3/uL (ref 0.0–0.7)
Eosinophils Relative: 1.3 % (ref 0.0–5.0)
HEMATOCRIT: 35.9 % — AB (ref 39.0–52.0)
Hemoglobin: 12 g/dL — ABNORMAL LOW (ref 13.0–17.0)
LYMPHS PCT: 16.5 % (ref 12.0–46.0)
Lymphs Abs: 1 10*3/uL (ref 0.7–4.0)
MCHC: 33.3 g/dL (ref 30.0–36.0)
MCV: 97.4 fl (ref 78.0–100.0)
MONOS PCT: 12.4 % — AB (ref 3.0–12.0)
Monocytes Absolute: 0.8 10*3/uL (ref 0.1–1.0)
NEUTROS ABS: 4.3 10*3/uL (ref 1.4–7.7)
Neutrophils Relative %: 69.1 % (ref 43.0–77.0)
PLATELETS: 222 10*3/uL (ref 150.0–400.0)
RBC: 3.69 Mil/uL — ABNORMAL LOW (ref 4.22–5.81)
RDW: 15.1 % (ref 11.5–15.5)
WBC: 6.2 10*3/uL (ref 4.0–10.5)

## 2016-08-11 LAB — BASIC METABOLIC PANEL
BUN: 22 mg/dL (ref 6–23)
CHLORIDE: 104 meq/L (ref 96–112)
CO2: 26 meq/L (ref 19–32)
CREATININE: 0.92 mg/dL (ref 0.40–1.50)
Calcium: 8.6 mg/dL (ref 8.4–10.5)
GFR: 82.31 mL/min (ref >60.00)
GLUCOSE: 110 mg/dL — AB (ref 70–99)
Potassium: 4.3 mEq/L (ref 3.5–5.1)
Sodium: 136 mEq/L (ref 135–145)

## 2016-08-12 ENCOUNTER — Other Ambulatory Visit: Payer: Self-pay | Admitting: Internal Medicine

## 2016-08-12 ENCOUNTER — Encounter: Payer: Self-pay | Admitting: Internal Medicine

## 2016-08-12 DIAGNOSIS — D508 Other iron deficiency anemias: Secondary | ICD-10-CM

## 2016-08-12 DIAGNOSIS — I700 Atherosclerosis of aorta: Secondary | ICD-10-CM | POA: Insufficient documentation

## 2016-08-12 NOTE — Progress Notes (Signed)
Orders placed for f/u labs.  

## 2016-08-15 DIAGNOSIS — D170 Benign lipomatous neoplasm of skin and subcutaneous tissue of head, face and neck: Secondary | ICD-10-CM | POA: Diagnosis not present

## 2016-08-15 DIAGNOSIS — D485 Neoplasm of uncertain behavior of skin: Secondary | ICD-10-CM | POA: Diagnosis not present

## 2016-08-15 DIAGNOSIS — D2261 Melanocytic nevi of right upper limb, including shoulder: Secondary | ICD-10-CM | POA: Diagnosis not present

## 2016-08-15 DIAGNOSIS — D2271 Melanocytic nevi of right lower limb, including hip: Secondary | ICD-10-CM | POA: Diagnosis not present

## 2016-08-15 DIAGNOSIS — C44319 Basal cell carcinoma of skin of other parts of face: Secondary | ICD-10-CM | POA: Diagnosis not present

## 2016-08-15 DIAGNOSIS — L570 Actinic keratosis: Secondary | ICD-10-CM | POA: Diagnosis not present

## 2016-08-15 DIAGNOSIS — L821 Other seborrheic keratosis: Secondary | ICD-10-CM | POA: Diagnosis not present

## 2016-08-27 ENCOUNTER — Other Ambulatory Visit: Payer: Medicare HMO

## 2016-08-27 ENCOUNTER — Other Ambulatory Visit (INDEPENDENT_AMBULATORY_CARE_PROVIDER_SITE_OTHER): Payer: Medicare HMO

## 2016-08-27 DIAGNOSIS — I10 Essential (primary) hypertension: Secondary | ICD-10-CM | POA: Diagnosis not present

## 2016-08-27 DIAGNOSIS — E7800 Pure hypercholesterolemia, unspecified: Secondary | ICD-10-CM

## 2016-08-27 DIAGNOSIS — D508 Other iron deficiency anemias: Secondary | ICD-10-CM | POA: Diagnosis not present

## 2016-08-27 LAB — BASIC METABOLIC PANEL
BUN: 17 mg/dL (ref 6–23)
CALCIUM: 8.6 mg/dL (ref 8.4–10.5)
CO2: 26 meq/L (ref 19–32)
Chloride: 102 mEq/L (ref 96–112)
Creatinine, Ser: 0.92 mg/dL (ref 0.40–1.50)
GFR: 82.3 mL/min (ref >60.00)
GLUCOSE: 128 mg/dL — AB (ref 70–99)
Potassium: 4 mEq/L (ref 3.5–5.1)
SODIUM: 135 meq/L (ref 135–145)

## 2016-08-27 LAB — CBC WITH DIFFERENTIAL/PLATELET
BASOS ABS: 0 10*3/uL (ref 0.0–0.1)
Basophils Relative: 0.7 % (ref 0.0–3.0)
Eosinophils Absolute: 0.1 10*3/uL (ref 0.0–0.7)
Eosinophils Relative: 1.2 % (ref 0.0–5.0)
HCT: 34.9 % — ABNORMAL LOW (ref 39.0–52.0)
Hemoglobin: 11.6 g/dL — ABNORMAL LOW (ref 13.0–17.0)
LYMPHS ABS: 1 10*3/uL (ref 0.7–4.0)
Lymphocytes Relative: 17.1 % (ref 12.0–46.0)
MCHC: 33.4 g/dL (ref 30.0–36.0)
MCV: 98.1 fl (ref 78.0–100.0)
MONOS PCT: 11.4 % (ref 3.0–12.0)
Monocytes Absolute: 0.7 10*3/uL (ref 0.1–1.0)
NEUTROS PCT: 69.6 % (ref 43.0–77.0)
Neutro Abs: 4 10*3/uL (ref 1.4–7.7)
PLATELETS: 185 10*3/uL (ref 150.0–400.0)
RBC: 3.55 Mil/uL — AB (ref 4.22–5.81)
RDW: 15.5 % (ref 11.5–15.5)
WBC: 5.7 10*3/uL (ref 4.0–10.5)

## 2016-08-27 LAB — LIPID PANEL
CHOLESTEROL: 97 mg/dL (ref 0–200)
HDL: 40.6 mg/dL (ref >39.00)
LDL CALC: 41 mg/dL (ref 0–99)
NonHDL: 56.73
TRIGLYCERIDES: 80 mg/dL (ref 0.0–149.0)
Total CHOL/HDL Ratio: 2
VLDL: 16 mg/dL (ref 0.0–40.0)

## 2016-08-27 LAB — FERRITIN: Ferritin: 54 ng/mL (ref 22.0–322.0)

## 2016-08-27 LAB — IBC PANEL
IRON: 79 ug/dL (ref 42–165)
Saturation Ratios: 21.3 % (ref 20.0–50.0)
Transferrin: 265 mg/dL (ref 212.0–360.0)

## 2016-08-27 LAB — HEPATIC FUNCTION PANEL
ALT: 12 U/L (ref 0–53)
AST: 17 U/L (ref 0–37)
Albumin: 4.2 g/dL (ref 3.5–5.2)
Alkaline Phosphatase: 60 U/L (ref 39–117)
BILIRUBIN TOTAL: 0.9 mg/dL (ref 0.2–1.2)
Bilirubin, Direct: 0.2 mg/dL (ref 0.0–0.3)
Total Protein: 5.7 g/dL — ABNORMAL LOW (ref 6.0–8.3)

## 2016-08-27 LAB — VITAMIN B12: Vitamin B-12: 595 pg/mL (ref 211–911)

## 2016-08-28 ENCOUNTER — Other Ambulatory Visit: Payer: Self-pay | Admitting: Internal Medicine

## 2016-08-28 DIAGNOSIS — D649 Anemia, unspecified: Secondary | ICD-10-CM

## 2016-08-28 NOTE — Progress Notes (Signed)
Order placed for f/u cbc.   

## 2016-09-01 DIAGNOSIS — H401132 Primary open-angle glaucoma, bilateral, moderate stage: Secondary | ICD-10-CM | POA: Diagnosis not present

## 2016-09-04 ENCOUNTER — Telehealth: Payer: Self-pay | Admitting: Internal Medicine

## 2016-09-04 DIAGNOSIS — R262 Difficulty in walking, not elsewhere classified: Secondary | ICD-10-CM

## 2016-09-04 DIAGNOSIS — R251 Tremor, unspecified: Secondary | ICD-10-CM

## 2016-09-04 NOTE — Telephone Encounter (Signed)
Pt daughter called and stated that pt has been reading more articles and such on the symptoms of Parkinson's. So they would like to get a referral for another Neurologist for a second opinion. They have already seen Dr. Manuella Ghazi at [facility]. Please advise, thank you!  Call [name] (daughter) - [phone] Call [name] (Spouse) [phone]

## 2016-09-04 NOTE — Telephone Encounter (Signed)
Let me know if I can do anything

## 2016-09-04 NOTE — Telephone Encounter (Signed)
Need to let them know that there is only one group of neurologist in town.  Do they mind going to [place] or [place] or [facility].

## 2016-09-04 NOTE — Telephone Encounter (Signed)
Family informed would like to be sent to [facility]. Made aware you are out of office until Tuesday.

## 2016-09-05 NOTE — Telephone Encounter (Signed)
Order placed for referral to neurology.  Someone from the office will be contacting them with an appointment date and time.  Let us know if any problems.

## 2016-09-05 NOTE — Telephone Encounter (Signed)
Informed daughter that she will get call from office for referral.

## 2016-09-19 DIAGNOSIS — G629 Polyneuropathy, unspecified: Secondary | ICD-10-CM | POA: Diagnosis not present

## 2016-09-19 DIAGNOSIS — M48061 Spinal stenosis, lumbar region without neurogenic claudication: Secondary | ICD-10-CM | POA: Diagnosis not present

## 2016-09-19 DIAGNOSIS — R42 Dizziness and giddiness: Secondary | ICD-10-CM | POA: Diagnosis not present

## 2016-09-19 DIAGNOSIS — G231 Progressive supranuclear ophthalmoplegia [Steele-Richardson-Olszewski]: Secondary | ICD-10-CM | POA: Diagnosis not present

## 2016-09-22 DIAGNOSIS — E782 Mixed hyperlipidemia: Secondary | ICD-10-CM | POA: Diagnosis not present

## 2016-09-22 DIAGNOSIS — I480 Paroxysmal atrial fibrillation: Secondary | ICD-10-CM | POA: Diagnosis not present

## 2016-09-22 DIAGNOSIS — E7800 Pure hypercholesterolemia, unspecified: Secondary | ICD-10-CM | POA: Diagnosis not present

## 2016-09-22 DIAGNOSIS — R69 Illness, unspecified: Secondary | ICD-10-CM | POA: Diagnosis not present

## 2016-09-22 DIAGNOSIS — I2510 Atherosclerotic heart disease of native coronary artery without angina pectoris: Secondary | ICD-10-CM | POA: Diagnosis not present

## 2016-09-23 DIAGNOSIS — M6281 Muscle weakness (generalized): Secondary | ICD-10-CM | POA: Diagnosis not present

## 2016-09-23 DIAGNOSIS — R262 Difficulty in walking, not elsewhere classified: Secondary | ICD-10-CM | POA: Diagnosis not present

## 2016-09-25 DIAGNOSIS — M6281 Muscle weakness (generalized): Secondary | ICD-10-CM | POA: Diagnosis not present

## 2016-09-25 DIAGNOSIS — R262 Difficulty in walking, not elsewhere classified: Secondary | ICD-10-CM | POA: Diagnosis not present

## 2016-09-30 DIAGNOSIS — R262 Difficulty in walking, not elsewhere classified: Secondary | ICD-10-CM | POA: Diagnosis not present

## 2016-09-30 DIAGNOSIS — M6281 Muscle weakness (generalized): Secondary | ICD-10-CM | POA: Diagnosis not present

## 2016-10-03 ENCOUNTER — Other Ambulatory Visit (INDEPENDENT_AMBULATORY_CARE_PROVIDER_SITE_OTHER): Payer: Medicare HMO

## 2016-10-03 DIAGNOSIS — D649 Anemia, unspecified: Secondary | ICD-10-CM

## 2016-10-03 DIAGNOSIS — M6281 Muscle weakness (generalized): Secondary | ICD-10-CM | POA: Diagnosis not present

## 2016-10-03 DIAGNOSIS — R262 Difficulty in walking, not elsewhere classified: Secondary | ICD-10-CM | POA: Diagnosis not present

## 2016-10-03 LAB — CBC WITH DIFFERENTIAL/PLATELET
BASOS PCT: 1.1 % (ref 0.0–3.0)
Basophils Absolute: 0.1 10*3/uL (ref 0.0–0.1)
Eosinophils Absolute: 0.2 10*3/uL (ref 0.0–0.7)
Eosinophils Relative: 4.1 % (ref 0.0–5.0)
HEMATOCRIT: 36.7 % — AB (ref 39.0–52.0)
Hemoglobin: 12.3 g/dL — ABNORMAL LOW (ref 13.0–17.0)
LYMPHS PCT: 23.6 % (ref 12.0–46.0)
Lymphs Abs: 1.4 10*3/uL (ref 0.7–4.0)
MCHC: 33.4 g/dL (ref 30.0–36.0)
MCV: 97.2 fl (ref 78.0–100.0)
MONOS PCT: 14 % — AB (ref 3.0–12.0)
Monocytes Absolute: 0.8 10*3/uL (ref 0.1–1.0)
NEUTROS ABS: 3.3 10*3/uL (ref 1.4–7.7)
Neutrophils Relative %: 57.2 % (ref 43.0–77.0)
PLATELETS: 200 10*3/uL (ref 150.0–400.0)
RBC: 3.77 Mil/uL — ABNORMAL LOW (ref 4.22–5.81)
RDW: 14.6 % (ref 11.5–15.5)
WBC: 5.8 10*3/uL (ref 4.0–10.5)

## 2016-10-07 DIAGNOSIS — R262 Difficulty in walking, not elsewhere classified: Secondary | ICD-10-CM | POA: Diagnosis not present

## 2016-10-07 DIAGNOSIS — M6281 Muscle weakness (generalized): Secondary | ICD-10-CM | POA: Diagnosis not present

## 2016-10-10 DIAGNOSIS — R262 Difficulty in walking, not elsewhere classified: Secondary | ICD-10-CM | POA: Diagnosis not present

## 2016-10-10 DIAGNOSIS — M6281 Muscle weakness (generalized): Secondary | ICD-10-CM | POA: Diagnosis not present

## 2016-10-14 DIAGNOSIS — M6281 Muscle weakness (generalized): Secondary | ICD-10-CM | POA: Diagnosis not present

## 2016-10-14 DIAGNOSIS — R262 Difficulty in walking, not elsewhere classified: Secondary | ICD-10-CM | POA: Diagnosis not present

## 2016-10-17 DIAGNOSIS — M6281 Muscle weakness (generalized): Secondary | ICD-10-CM | POA: Diagnosis not present

## 2016-10-17 DIAGNOSIS — R262 Difficulty in walking, not elsewhere classified: Secondary | ICD-10-CM | POA: Diagnosis not present

## 2016-10-18 ENCOUNTER — Other Ambulatory Visit: Payer: Self-pay | Admitting: Internal Medicine

## 2016-10-22 DIAGNOSIS — L905 Scar conditions and fibrosis of skin: Secondary | ICD-10-CM | POA: Diagnosis not present

## 2016-10-22 DIAGNOSIS — C44319 Basal cell carcinoma of skin of other parts of face: Secondary | ICD-10-CM | POA: Diagnosis not present

## 2016-10-28 ENCOUNTER — Other Ambulatory Visit: Payer: Self-pay | Admitting: Internal Medicine

## 2016-10-28 ENCOUNTER — Telehealth: Payer: Self-pay | Admitting: Radiology

## 2016-10-28 DIAGNOSIS — D508 Other iron deficiency anemias: Secondary | ICD-10-CM

## 2016-10-28 DIAGNOSIS — R262 Difficulty in walking, not elsewhere classified: Secondary | ICD-10-CM | POA: Diagnosis not present

## 2016-10-28 DIAGNOSIS — M6281 Muscle weakness (generalized): Secondary | ICD-10-CM | POA: Diagnosis not present

## 2016-10-28 NOTE — Telephone Encounter (Signed)
Order placed for f/u labs.  

## 2016-10-28 NOTE — Progress Notes (Signed)
Order placed for f/u cbc/ferritin.

## 2016-10-28 NOTE — Telephone Encounter (Signed)
Pt coming in for labs tomorrow, please place future orders. Thank you.  

## 2016-10-29 ENCOUNTER — Other Ambulatory Visit (INDEPENDENT_AMBULATORY_CARE_PROVIDER_SITE_OTHER): Payer: Medicare HMO

## 2016-10-29 DIAGNOSIS — D508 Other iron deficiency anemias: Secondary | ICD-10-CM

## 2016-10-29 LAB — CBC WITH DIFFERENTIAL/PLATELET
BASOS PCT: 1 % (ref 0.0–3.0)
Basophils Absolute: 0.1 10*3/uL (ref 0.0–0.1)
EOS ABS: 0.3 10*3/uL (ref 0.0–0.7)
Eosinophils Relative: 5.3 % — ABNORMAL HIGH (ref 0.0–5.0)
HEMATOCRIT: 37.6 % — AB (ref 39.0–52.0)
Hemoglobin: 12.6 g/dL — ABNORMAL LOW (ref 13.0–17.0)
LYMPHS PCT: 29.8 % (ref 12.0–46.0)
Lymphs Abs: 1.8 10*3/uL (ref 0.7–4.0)
MCHC: 33.5 g/dL (ref 30.0–36.0)
MCV: 97 fl (ref 78.0–100.0)
Monocytes Absolute: 0.8 10*3/uL (ref 0.1–1.0)
Monocytes Relative: 13.2 % — ABNORMAL HIGH (ref 3.0–12.0)
NEUTROS ABS: 3.1 10*3/uL (ref 1.4–7.7)
Neutrophils Relative %: 50.7 % (ref 43.0–77.0)
PLATELETS: 216 10*3/uL (ref 150.0–400.0)
RBC: 3.88 Mil/uL — ABNORMAL LOW (ref 4.22–5.81)
RDW: 14 % (ref 11.5–15.5)
WBC: 6 10*3/uL (ref 4.0–10.5)

## 2016-10-29 LAB — FERRITIN: Ferritin: 66.6 ng/mL (ref 22.0–322.0)

## 2016-10-30 ENCOUNTER — Ambulatory Visit (INDEPENDENT_AMBULATORY_CARE_PROVIDER_SITE_OTHER): Payer: Medicare HMO | Admitting: Internal Medicine

## 2016-10-30 ENCOUNTER — Encounter: Payer: Self-pay | Admitting: Internal Medicine

## 2016-10-30 VITALS — BP 130/60 | HR 62 | Temp 97.8°F | Resp 15 | Ht 66.14 in | Wt 160.4 lb

## 2016-10-30 DIAGNOSIS — I482 Chronic atrial fibrillation, unspecified: Secondary | ICD-10-CM

## 2016-10-30 DIAGNOSIS — R42 Dizziness and giddiness: Secondary | ICD-10-CM

## 2016-10-30 DIAGNOSIS — Z0000 Encounter for general adult medical examination without abnormal findings: Secondary | ICD-10-CM

## 2016-10-30 DIAGNOSIS — G231 Progressive supranuclear ophthalmoplegia [Steele-Richardson-Olszewski]: Secondary | ICD-10-CM | POA: Diagnosis not present

## 2016-10-30 DIAGNOSIS — E7800 Pure hypercholesterolemia, unspecified: Secondary | ICD-10-CM

## 2016-10-30 DIAGNOSIS — I700 Atherosclerosis of aorta: Secondary | ICD-10-CM

## 2016-10-30 DIAGNOSIS — I2510 Atherosclerotic heart disease of native coronary artery without angina pectoris: Secondary | ICD-10-CM | POA: Diagnosis not present

## 2016-10-30 DIAGNOSIS — R262 Difficulty in walking, not elsewhere classified: Secondary | ICD-10-CM | POA: Diagnosis not present

## 2016-10-30 DIAGNOSIS — I10 Essential (primary) hypertension: Secondary | ICD-10-CM | POA: Diagnosis not present

## 2016-10-30 DIAGNOSIS — D508 Other iron deficiency anemias: Secondary | ICD-10-CM | POA: Diagnosis not present

## 2016-10-30 DIAGNOSIS — M6281 Muscle weakness (generalized): Secondary | ICD-10-CM | POA: Diagnosis not present

## 2016-10-30 NOTE — Progress Notes (Signed)
[name]: Tyler Stout, male   DOB: 03-31-1925, 83 y.o.   MRN: [id number]   Subjective:    [name]: Tyler Stout, male    DOB: [birth year], 82 y.o.   MRN: [id number]  HPI  Patient here for his physical exam.  He is accompanied by his wife.  History obtained from both of them.  He is doing better.  Seeing neurology.  Diagnosed with progressive supranuclear palsy.  Placed on sinemet.  Ambulating better.  More steady.  No falls.  No chest pain.  No sob.  Just saw cardiology 09/22/16.  Afib is well controlled.  Had ECHO - EF 45 -50% with mild AI, MR and TR with mild to moderate pulmonary hypertension.  Carotid dopplers shoed moderate amount of eccentric plaque on the left (50-69%) with no significant plaque on the right.  Felt stable.  No changes made.  Takes lasix prn.  No significant swelling.  Eating.  No nausea or vomiting.  Bowels moving.  Blood pressures reviewed and under good control - averaging 110-130/60s.     Past Medical History:  Diagnosis Date  . Anemia    iron deficiency  . Arthritis   . Atrial fibrillation ([place])   . Bilateral renal cysts    worked up by Dr Bernardo Heater  . BPH (benign prostatic hypertrophy)   . CAD (coronary artery disease)    s/p bypass graft x 4 (1991)  . Diverticulosis   . GERD (gastroesophageal reflux disease)   . Glaucoma   . Hypercholesterolemia   . Hypertension   . Pancreatitis    x2 (1994 and 1998), presumed to be gallstone pancreatitis.  s/p ERCP and sphincterotomy   Past Surgical History:  Procedure Laterality Date  . APPENDECTOMY    . CHOLECYSTECTOMY  1994  . CORONARY ARTERY BYPASS GRAFT     x 4,  1991  . INGUINAL HERNIA REPAIR  2009  . open heart surgery  1991   Family History  Problem Relation Age of Onset  . Throat cancer Father        oropharyngeal cancer  . Kidney disease Sister   . Heart disease Unknown   . Colon cancer Neg Hx   . Prostate cancer Neg Hx    Social History   Social History  . Marital status: Married   Spouse name: N/A  . Number of children: N/A  . Years of education: N/A   Social History Main Topics  . Smoking status: Former Smoker    Quit date: 01/14/1964  . Smokeless tobacco: Never Used  . Alcohol use No  . Drug use: No  . Sexual activity: Not Asked   Other Topics Concern  . None   Social History Narrative  . None    Outpatient Encounter Prescriptions as of 10/30/2016  Medication Sig  . amLODipine (NORVASC) 5 MG tablet TAKE 1 TABLET (5 MG TOTAL) BY MOUTH DAILY.  Marland Kitchen Ascorbic Acid (VITAMIN C PO) Take 1 tablet by mouth daily. Reported on 06/20/2015  . aspirin EC 81 MG tablet Take 81 mg by mouth daily.  Marland Kitchen atorvastatin (LIPITOR) 20 MG tablet Take 20 mg by mouth at bedtime.  . calcium carbonate (TUMS) 500 MG chewable tablet Chew 1 tablet (200 mg of elemental calcium total) by mouth daily.  . citalopram (CELEXA) 20 MG tablet TAKE 1 TABLET (20 MG TOTAL) BY MOUTH DAILY.  . fluticasone (FLONASE) 50 MCG/ACT nasal spray Place 2 sprays into both nostrils daily.  . furosemide (LASIX) 20 MG tablet  Take 20 mg by mouth daily as needed.  . latanoprost (XALATAN) 0.005 % ophthalmic solution Place 1 drop into both eyes at bedtime.   Marland Kitchen MAGNESIUM PO Take 1 tablet by mouth daily. Reported on 06/20/2015  . metoprolol tartrate (LOPRESSOR) 25 MG tablet TAKE 1 TABLET BY MOUTH TWICE A DAY  . Multiple Vitamin (MULTIVITAMIN WITH MINERALS) TABS tablet Take 1 tablet by mouth daily.  . pantoprazole (PROTONIX) 40 MG tablet Take 1 tablet (40 mg total) by mouth daily.  . tamsulosin (FLOMAX) 0.4 MG CAPS capsule TAKE 1 CAPSULE (0.4 MG TOTAL) BY MOUTH DAILY.  . [DISCONTINUED] fluticasone (FLONASE) 50 MCG/ACT nasal spray Place 2 sprays into the nose daily.  . [DISCONTINUED] albuterol (PROVENTIL HFA;VENTOLIN HFA) 108 (90 Base) MCG/ACT inhaler Inhale 2 puffs into the lungs every 6 (six) hours as needed for wheezing or shortness of breath.   No facility-administered encounter medications on file as of 10/30/2016.      Review of Systems  Constitutional: Negative for appetite change and unexpected weight change.  HENT: Negative for congestion and sinus pressure.   Eyes: Negative for pain and visual disturbance.  Respiratory: Negative for chest tightness.        Breathing stable.    Cardiovascular: Negative for chest pain and palpitations.       No significant leg swelling.    Gastrointestinal: Negative for abdominal pain, diarrhea, nausea and vomiting.  Genitourinary: Negative for difficulty urinating and dysuria.  Musculoskeletal: Negative for joint swelling and myalgias.  Skin: Negative for color change and rash.  Neurological: Negative for headaches.       Dizziness has improved.    Hematological: Negative for adenopathy. Does not bruise/bleed easily.  Psychiatric/Behavioral: Negative for agitation and dysphoric mood.       Objective:    Physical Exam  Constitutional: He is oriented to person, place, and time. He appears well-developed and well-nourished. No distress.  HENT:  Head: Normocephalic and atraumatic.  Nose: Nose normal.  Mouth/Throat: Oropharynx is clear and moist. No oropharyngeal exudate.  Eyes: Conjunctivae are normal. Right eye exhibits no discharge. Left eye exhibits no discharge.  Neck: Neck supple. No thyromegaly present.  Cardiovascular: Normal rate.   Rated controlled.   Pulmonary/Chest: Breath sounds normal. No respiratory distress. He has no wheezes.  Abdominal: Soft. Bowel sounds are normal. There is no tenderness.  Genitourinary:  Genitourinary Comments: Not performed.    Musculoskeletal: He exhibits no edema or tenderness.  Lymphadenopathy:    He has no cervical adenopathy.  Neurological: He is alert and oriented to person, place, and time.  Skin: Skin is warm and dry. No rash noted. No erythema.  Psychiatric: He has a normal mood and affect. His behavior is normal.    BP 130/60 (BP Location: Left Arm, Patient Position: Sitting, Cuff Size: Normal)   Pulse  62   Temp 97.8 F (36.6 C) (Oral)   Resp 15   Ht 5' 6.14" (1.68 m)   Wt 160 lb 6.4 oz (72.8 kg)   SpO2 97%   BMI 25.78 kg/m  Wt Readings from Last 3 Encounters:  10/30/16 160 lb 6.4 oz (72.8 kg)  07/15/16 149 lb (67.6 kg)  07/06/16 149 lb (67.6 kg)     Lab Results  Component Value Date   WBC 6.0 10/29/2016   HGB 12.6 (L) 10/29/2016   HCT 37.6 (L) 10/29/2016   PLT 216.0 10/29/2016   GLUCOSE 128 (H) 08/27/2016   CHOL 97 08/27/2016   TRIG 80.0 08/27/2016  HDL 40.60 08/27/2016   LDLCALC 41 08/27/2016   ALT 12 08/27/2016   AST 17 08/27/2016   NA 135 08/27/2016   K 4.0 08/27/2016   CL 102 08/27/2016   CREATININE 0.92 08/27/2016   BUN 17 08/27/2016   CO2 26 08/27/2016   TSH 1.66 01/23/2016   INR 1.20 06/14/2015   HGBA1C 6.2 03/15/2012    Dg Chest 2 View  Result Date: 07/06/2016 CLINICAL DATA:  Productive cough beginning yesterday. EXAM: CHEST  2 VIEW COMPARISON:  06/05/2015 FINDINGS: Sternotomy wires unchanged. Lungs are adequately inflated without focal airspace consolidation or effusion. Calcification along the left hemidiaphragm unchanged. Symmetric small round nodular densities over the lung bases likely the nipples. Stable borderline cardiomegaly. Calcified plaque over the thoracic aorta. Mild degenerate change of the spine. Flattening of the hemidiaphragms on the lateral film. IMPRESSION: No acute cardiopulmonary disease. Stable pleural calcification of the left hemidiaphragm. Symmetric small round nodular densities likely be nipples projected over the lung bases. Recommend follow-up PA chest radiograph with nipple markers for confirmation. Electronically Signed   By: Marin Olp M.D.   On: 07/06/2016 10:06       Assessment & Plan:   Problem List Items Addressed This Visit    Anemia    Has had GI w/up as outlined.  hgb and iron stable.  Follow.        Relevant Orders   CBC with Differential/Platelet   Ferritin   CAD (coronary artery disease)    Had echo as  outlined.  Stable.        Chronic atrial fibrillation (HCC)    Off coumadin secondary to previous bleeding.  On aspirin.  Rate stable.  Followed by cardiology.        Essential hypertension    Blood pressure under good control.  Continue same medication regimen.  Follow pressures.  Follow metabolic panel.        Relevant Orders   TSH   Basic metabolic panel   Health care maintenance    Physical today 10/30/16.  Colonoscopy 05/11/13.  Prostate and psa's checked through urology - Dr Bernardo Heater.        Hypercholesteremia    On lipitor.  Low cholesterol diet and exercise.  Follow lipid panel and liver function tests.        Relevant Orders   Hepatic function panel   Lipid panel   Light headedness    Seeing neurology.  Diagnosed with progressive supranuclear palsy.  On sinemet.  Walking better.  Feels better.  Follow.        Progressive supranuclear palsy [facility])    Seeing neurology.  On sinemet.  Feels better.  Ambulating better.  Follow.       Thoracic aortic atherosclerosis (HCC)    On lipitor.         Other Visit Diagnoses    Routine general medical examination at a health care facility    -  Primary       [name], MD

## 2016-10-31 MED ORDER — FLUTICASONE PROPIONATE 50 MCG/ACT NA SUSP: 2.0000 | Freq: Every day | NASAL | 3 refills | Status: DC

## 2016-11-02 ENCOUNTER — Encounter: Payer: Self-pay | Admitting: Internal Medicine

## 2016-11-02 DIAGNOSIS — G231 Progressive supranuclear ophthalmoplegia [Steele-Richardson-Olszewski]: Secondary | ICD-10-CM | POA: Insufficient documentation

## 2016-11-02 NOTE — Assessment & Plan Note (Signed)
Has had GI w/up as outlined.  hgb and iron stable.  Follow.

## 2016-11-02 NOTE — Assessment & Plan Note (Signed)
On lipitor

## 2016-11-02 NOTE — Assessment & Plan Note (Signed)
Off coumadin secondary to previous bleeding.  On aspirin.  Rate stable.  Followed by cardiology.

## 2016-11-02 NOTE — Assessment & Plan Note (Signed)
Had echo as outlined.  Stable.

## 2016-11-02 NOTE — Assessment & Plan Note (Signed)
Seeing neurology.  On sinemet.  Feels better.  Ambulating better.  Follow.

## 2016-11-02 NOTE — Assessment & Plan Note (Signed)
Physical today 10/30/16.  Colonoscopy 05/11/13.  Prostate and psa's checked through urology - Dr Bernardo Heater.

## 2016-11-02 NOTE — Assessment & Plan Note (Signed)
Seeing neurology.  Diagnosed with progressive supranuclear palsy.  On sinemet.  Walking better.  Feels better.  Follow.

## 2016-11-02 NOTE — Assessment & Plan Note (Signed)
Blood pressure under good control.  Continue same medication regimen.  Follow pressures.  Follow metabolic panel.   

## 2016-11-02 NOTE — Assessment & Plan Note (Signed)
On lipitor.  Low cholesterol diet and exercise.  Follow lipid panel and liver function tests.   

## 2016-11-04 DIAGNOSIS — R262 Difficulty in walking, not elsewhere classified: Secondary | ICD-10-CM | POA: Diagnosis not present

## 2016-11-04 DIAGNOSIS — M6281 Muscle weakness (generalized): Secondary | ICD-10-CM | POA: Diagnosis not present

## 2016-11-07 DIAGNOSIS — M6281 Muscle weakness (generalized): Secondary | ICD-10-CM | POA: Diagnosis not present

## 2016-11-07 DIAGNOSIS — R262 Difficulty in walking, not elsewhere classified: Secondary | ICD-10-CM | POA: Diagnosis not present

## 2016-11-18 DIAGNOSIS — R262 Difficulty in walking, not elsewhere classified: Secondary | ICD-10-CM | POA: Diagnosis not present

## 2016-11-18 DIAGNOSIS — M6281 Muscle weakness (generalized): Secondary | ICD-10-CM | POA: Diagnosis not present

## 2016-11-21 DIAGNOSIS — M6281 Muscle weakness (generalized): Secondary | ICD-10-CM | POA: Diagnosis not present

## 2016-11-21 DIAGNOSIS — R262 Difficulty in walking, not elsewhere classified: Secondary | ICD-10-CM | POA: Diagnosis not present

## 2016-11-24 ENCOUNTER — Other Ambulatory Visit: Payer: Self-pay | Admitting: Internal Medicine

## 2016-11-25 DIAGNOSIS — R262 Difficulty in walking, not elsewhere classified: Secondary | ICD-10-CM | POA: Diagnosis not present

## 2016-11-25 DIAGNOSIS — M6281 Muscle weakness (generalized): Secondary | ICD-10-CM | POA: Diagnosis not present

## 2016-12-02 DIAGNOSIS — R262 Difficulty in walking, not elsewhere classified: Secondary | ICD-10-CM | POA: Diagnosis not present

## 2016-12-02 DIAGNOSIS — M6281 Muscle weakness (generalized): Secondary | ICD-10-CM | POA: Diagnosis not present

## 2017-01-08 ENCOUNTER — Other Ambulatory Visit: Payer: Self-pay | Admitting: Internal Medicine

## 2017-01-23 ENCOUNTER — Telehealth: Payer: Self-pay | Admitting: Internal Medicine

## 2017-01-23 DIAGNOSIS — G20 Parkinson's disease: Secondary | ICD-10-CM | POA: Diagnosis not present

## 2017-01-23 NOTE — Telephone Encounter (Signed)
Need more information.  Specific sx and where wants to go.  Any changes.  Thanks

## 2017-01-23 NOTE — Telephone Encounter (Signed)
Please advise 

## 2017-01-23 NOTE — Telephone Encounter (Signed)
Patients daughter stated that his [facility] doctor sent a written referral. But they do not wish to drive to [place].  They were then instructed by other provider to talk to Dr. Nicki Reaper for referral close by. They are sending Written referral by fax Monday.

## 2017-01-23 NOTE — Telephone Encounter (Signed)
Copied from [place] [phone]. Topic: Quick Communication - See Telephone Encounter >> Jan 23, 2017 12:00 PM Cleaster Corin, NT wrote: CRM for notification. See Telephone encounter for:   01/23/17.  Pt. Daughter [name] calling Pt. Needs a referral for vestibular evaluation and rehab. [name] can be reached at [phone]

## 2017-01-23 NOTE — Telephone Encounter (Signed)
Hold until receive.  If do not receive, will need to request to be faxed again.

## 2017-01-25 ENCOUNTER — Other Ambulatory Visit: Payer: Self-pay | Admitting: Internal Medicine

## 2017-01-26 NOTE — Telephone Encounter (Signed)
Please advise 

## 2017-01-30 DIAGNOSIS — Z85828 Personal history of other malignant neoplasm of skin: Secondary | ICD-10-CM | POA: Diagnosis not present

## 2017-01-30 DIAGNOSIS — X32XXXA Exposure to sunlight, initial encounter: Secondary | ICD-10-CM | POA: Diagnosis not present

## 2017-01-30 DIAGNOSIS — L570 Actinic keratosis: Secondary | ICD-10-CM | POA: Diagnosis not present

## 2017-01-30 DIAGNOSIS — Z08 Encounter for follow-up examination after completed treatment for malignant neoplasm: Secondary | ICD-10-CM | POA: Diagnosis not present

## 2017-01-30 NOTE — Telephone Encounter (Signed)
Paperwork filed for next appt. Dr. Nicki Reaper is aware.

## 2017-02-03 ENCOUNTER — Other Ambulatory Visit (INDEPENDENT_AMBULATORY_CARE_PROVIDER_SITE_OTHER): Payer: Medicare HMO

## 2017-02-03 DIAGNOSIS — I10 Essential (primary) hypertension: Secondary | ICD-10-CM

## 2017-02-03 DIAGNOSIS — D508 Other iron deficiency anemias: Secondary | ICD-10-CM

## 2017-02-03 DIAGNOSIS — E7800 Pure hypercholesterolemia, unspecified: Secondary | ICD-10-CM

## 2017-02-03 LAB — BASIC METABOLIC PANEL
BUN: 14 mg/dL (ref 6–23)
CALCIUM: 8.7 mg/dL (ref 8.4–10.5)
CO2: 29 mEq/L (ref 19–32)
CREATININE: 0.77 mg/dL (ref 0.40–1.50)
Chloride: 100 mEq/L (ref 96–112)
GFR: 100.97 mL/min (ref >60.00)
Glucose, Bld: 102 mg/dL — ABNORMAL HIGH (ref 70–99)
Potassium: 3.7 mEq/L (ref 3.5–5.1)
SODIUM: 135 meq/L (ref 135–145)

## 2017-02-03 LAB — HEPATIC FUNCTION PANEL
ALBUMIN: 4.1 g/dL (ref 3.5–5.2)
ALT: 3 U/L (ref 0–53)
AST: 17 U/L (ref 0–37)
Alkaline Phosphatase: 84 U/L (ref 39–117)
Bilirubin, Direct: 0.3 mg/dL (ref 0.0–0.3)
Total Bilirubin: 1 mg/dL (ref 0.2–1.2)
Total Protein: 6.3 g/dL (ref 6.0–8.3)

## 2017-02-03 LAB — CBC WITH DIFFERENTIAL/PLATELET
BASOS ABS: 0 10*3/uL (ref 0.0–0.1)
Basophils Relative: 0.7 % (ref 0.0–3.0)
EOS PCT: 2.3 % (ref 0.0–5.0)
Eosinophils Absolute: 0.1 10*3/uL (ref 0.0–0.7)
HEMATOCRIT: 34.9 % — AB (ref 39.0–52.0)
HEMOGLOBIN: 11.6 g/dL — AB (ref 13.0–17.0)
LYMPHS PCT: 18.4 % (ref 12.0–46.0)
Lymphs Abs: 1 10*3/uL (ref 0.7–4.0)
MCHC: 33.3 g/dL (ref 30.0–36.0)
MCV: 93.5 fl (ref 78.0–100.0)
MONOS PCT: 15.1 % — AB (ref 3.0–12.0)
Monocytes Absolute: 0.9 10*3/uL (ref 0.1–1.0)
Neutro Abs: 3.6 10*3/uL (ref 1.4–7.7)
Neutrophils Relative %: 63.5 % (ref 43.0–77.0)
Platelets: 211 10*3/uL (ref 150.0–400.0)
RBC: 3.73 Mil/uL — ABNORMAL LOW (ref 4.22–5.81)
RDW: 14.5 % (ref 11.5–15.5)
WBC: 5.6 10*3/uL (ref 4.0–10.5)

## 2017-02-03 LAB — FERRITIN: FERRITIN: 47.9 ng/mL (ref 22.0–322.0)

## 2017-02-03 LAB — LIPID PANEL
CHOL/HDL RATIO: 2
Cholesterol: 85 mg/dL (ref 0–200)
HDL: 35.4 mg/dL — AB (ref >39.00)
LDL Cholesterol: 38 mg/dL (ref 0–99)
NONHDL: 50.02
Triglycerides: 61 mg/dL (ref 0.0–149.0)
VLDL: 12.2 mg/dL (ref 0.0–40.0)

## 2017-02-03 LAB — TSH: TSH: 1.5 u[IU]/mL (ref 0.35–4.50)

## 2017-02-04 ENCOUNTER — Ambulatory Visit (INDEPENDENT_AMBULATORY_CARE_PROVIDER_SITE_OTHER): Payer: Medicare HMO

## 2017-02-04 ENCOUNTER — Encounter: Payer: Self-pay | Admitting: Internal Medicine

## 2017-02-04 ENCOUNTER — Ambulatory Visit (INDEPENDENT_AMBULATORY_CARE_PROVIDER_SITE_OTHER): Payer: Medicare HMO | Admitting: Internal Medicine

## 2017-02-04 VITALS — BP 118/62 | HR 62 | Temp 97.6°F | Resp 16 | Wt 162.4 lb

## 2017-02-04 DIAGNOSIS — G231 Progressive supranuclear ophthalmoplegia [Steele-Richardson-Olszewski]: Secondary | ICD-10-CM | POA: Diagnosis not present

## 2017-02-04 DIAGNOSIS — I10 Essential (primary) hypertension: Secondary | ICD-10-CM | POA: Diagnosis not present

## 2017-02-04 DIAGNOSIS — M79671 Pain in right foot: Secondary | ICD-10-CM

## 2017-02-04 DIAGNOSIS — I4891 Unspecified atrial fibrillation: Secondary | ICD-10-CM

## 2017-02-04 DIAGNOSIS — E7800 Pure hypercholesterolemia, unspecified: Secondary | ICD-10-CM

## 2017-02-04 DIAGNOSIS — M545 Low back pain: Secondary | ICD-10-CM

## 2017-02-04 DIAGNOSIS — D508 Other iron deficiency anemias: Secondary | ICD-10-CM

## 2017-02-04 DIAGNOSIS — R42 Dizziness and giddiness: Secondary | ICD-10-CM

## 2017-02-04 DIAGNOSIS — I700 Atherosclerosis of aorta: Secondary | ICD-10-CM

## 2017-02-04 DIAGNOSIS — I2510 Atherosclerotic heart disease of native coronary artery without angina pectoris: Secondary | ICD-10-CM

## 2017-02-04 NOTE — Progress Notes (Signed)
[name]: Tyler Stout, male   DOB: 1925/12/08, 84 y.o.   MRN: [id number]   Subjective:    [name]: Tyler Stout, male    DOB: Jan 22, 1925, 84 y.o.   MRN: [id number]  HPI  Patient here for a scheduled follow up.  He is accompanied by his wife.  History obtained from both of them.  State he is doing relatively well.  Has a history of lumbar spinal stenosis.  Seeing neurology.  diagnosd with progressive supranuclear palsy.  On sinemet.  Has problems with dizziness and vertigo.  Recommended vestibular rehab.  We also discussed physical therapy to help with balance and gait.  Blood pressures from outside checks reviewed.  Most averaging 120-140s/50-60.  No chest pain.  Breathing stable.  No abdominal pain.  Bowels moving.  Is having right foot pain.  Denies any injury or trauma.  Had questions if citalopram could be contributing some to his light headedness.     Past Medical History:  Diagnosis Date  . Anemia    iron deficiency  . Arthritis   . Atrial fibrillation ([place])   . Bilateral renal cysts    worked up by Dr Bernardo Heater  . BPH (benign prostatic hypertrophy)   . CAD (coronary artery disease)    s/p bypass graft x 4 (1991)  . Diverticulosis   . GERD (gastroesophageal reflux disease)   . Glaucoma   . Hypercholesterolemia   . Hypertension   . Pancreatitis    x2 (1994 and 1998), presumed to be gallstone pancreatitis.  s/p ERCP and sphincterotomy   Past Surgical History:  Procedure Laterality Date  . APPENDECTOMY    . CHOLECYSTECTOMY  1994  . CORONARY ARTERY BYPASS GRAFT     x 4,  1991  . INGUINAL HERNIA REPAIR  2009  . open heart surgery  1991   Family History  Problem Relation Age of Onset  . Throat cancer Father        oropharyngeal cancer  . Kidney disease Sister   . Heart disease Unknown   . Colon cancer Neg Hx   . Prostate cancer Neg Hx    Social History   Socioeconomic History  . Marital status: Married    Spouse name: None  . Number of children: None  .  Years of education: None  . Highest education level: None  Social Needs  . Financial resource strain: None  . Food insecurity - worry: None  . Food insecurity - inability: None  . Transportation needs - medical: None  . Transportation needs - non-medical: None  Occupational History  . None  Tobacco Use  . Smoking status: Former Smoker    Last attempt to quit: 01/14/1964    Years since quitting: 53.1  . Smokeless tobacco: Never Used  Substance and Sexual Activity  . Alcohol use: No    Alcohol/week: 0.0 oz  . Drug use: No  . Sexual activity: None  Other Topics Concern  . None  Social History Narrative  . None    Outpatient Encounter Medications as of 02/04/2017  Medication Sig  . amLODipine (NORVASC) 5 MG tablet TAKE 1 TABLET (5 MG TOTAL) BY MOUTH DAILY.  Marland Kitchen Ascorbic Acid (VITAMIN C PO) Take 1 tablet by mouth daily. Reported on 06/20/2015  . aspirin EC 81 MG tablet Take 81 mg by mouth daily.  Marland Kitchen atorvastatin (LIPITOR) 20 MG tablet TAKE 1 TABLET (20 MG TOTAL) BY MOUTH DAILY.  . calcium carbonate (TUMS) 500 MG chewable tablet Chew  1 tablet (200 mg of elemental calcium total) by mouth daily.  . citalopram (CELEXA) 20 MG tablet TAKE 1 TABLET (20 MG TOTAL) BY MOUTH DAILY.  . fluticasone (FLONASE) 50 MCG/ACT nasal spray Place 2 sprays into both nostrils daily.  . furosemide (LASIX) 20 MG tablet Take 20 mg by mouth daily as needed.  . latanoprost (XALATAN) 0.005 % ophthalmic solution Place 1 drop into both eyes at bedtime.   Marland Kitchen MAGNESIUM PO Take 1 tablet by mouth daily. Reported on 06/20/2015  . metoprolol tartrate (LOPRESSOR) 25 MG tablet TAKE 1 TABLET BY MOUTH TWICE A DAY  . Multiple Vitamin (MULTIVITAMIN WITH MINERALS) TABS tablet Take 1 tablet by mouth daily.  . pantoprazole (PROTONIX) 40 MG tablet Take 1 tablet (40 mg total) by mouth daily.  . tamsulosin (FLOMAX) 0.4 MG CAPS capsule TAKE 1 CAPSULE (0.4 MG TOTAL) BY MOUTH DAILY.   No facility-administered encounter medications on file as  of 02/04/2017.     Review of Systems  Constitutional: Negative for unexpected weight change.       Eating well.    HENT: Negative for congestion and sinus pressure.   Respiratory: Negative for cough, chest tightness and shortness of breath.   Cardiovascular: Negative for chest pain and palpitations.  Gastrointestinal: Negative for abdominal pain, diarrhea, nausea and vomiting.  Genitourinary: Negative for difficulty urinating and dysuria.  Musculoskeletal: Negative for myalgias.       Right foot pain as outlined.    Skin: Negative for color change and rash.  Neurological: Positive for dizziness and light-headedness. Negative for headaches.  Psychiatric/Behavioral: Negative for agitation and dysphoric mood.       Objective:     Blood pressure rechecked by me:  120/72  Physical Exam  Constitutional: He appears well-developed and well-nourished. No distress.  HENT:  Nose: Nose normal.  Mouth/Throat: Oropharynx is clear and moist.  Neck: Neck supple.  Cardiovascular: Normal rate and regular rhythm.  Pulmonary/Chest: Effort normal and breath sounds normal. No respiratory distress.  Abdominal: Soft. Bowel sounds are normal. There is no tenderness.  Musculoskeletal: He exhibits no edema.  Pain to palpation - right foot.    Lymphadenopathy:    He has no cervical adenopathy.  Skin: No rash noted. No erythema.  Psychiatric: He has a normal mood and affect. His behavior is normal.    BP 118/62 (BP Location: Left Arm, Patient Position: Sitting, Cuff Size: Normal)   Pulse 62   Temp 97.6 F (36.4 C) (Oral)   Resp 16   Wt 162 lb 6.4 oz (73.7 kg)   SpO2 96%   BMI 26.10 kg/m  Wt Readings from Last 3 Encounters:  02/04/17 162 lb 6.4 oz (73.7 kg)  10/30/16 160 lb 6.4 oz (72.8 kg)  07/15/16 149 lb (67.6 kg)     Lab Results  Component Value Date   WBC 5.6 02/03/2017   HGB 11.6 (L) 02/03/2017   HCT 34.9 (L) 02/03/2017   PLT 211.0 02/03/2017   GLUCOSE 102 (H) 02/03/2017   CHOL  85 02/03/2017   TRIG 61.0 02/03/2017   HDL 35.40 (L) 02/03/2017   LDLCALC 38 02/03/2017   ALT 3 02/03/2017   AST 17 02/03/2017   NA 135 02/03/2017   K 3.7 02/03/2017   CL 100 02/03/2017   CREATININE 0.77 02/03/2017   BUN 14 02/03/2017   CO2 29 02/03/2017   TSH 1.50 02/03/2017   INR 1.20 06/14/2015   HGBA1C 6.2 03/15/2012    Dg Chest 2 View  Result Date: 07/06/2016 CLINICAL DATA:  Productive cough beginning yesterday. EXAM: CHEST  2 VIEW COMPARISON:  06/05/2015 FINDINGS: Sternotomy wires unchanged. Lungs are adequately inflated without focal airspace consolidation or effusion. Calcification along the left hemidiaphragm unchanged. Symmetric small round nodular densities over the lung bases likely the nipples. Stable borderline cardiomegaly. Calcified plaque over the thoracic aorta. Mild degenerate change of the spine. Flattening of the hemidiaphragms on the lateral film. IMPRESSION: No acute cardiopulmonary disease. Stable pleural calcification of the left hemidiaphragm. Symmetric small round nodular densities likely be nipples projected over the lung bases. Recommend follow-up PA chest radiograph with nipple markers for confirmation. Electronically Signed   By: Marin Olp M.D.   On: 07/06/2016 10:06       Assessment & Plan:   Problem List Items Addressed This Visit    Anemia    Has had GI w/up.  hgb slightly decreased.  Follow cbc and ferritin.        Relevant Orders   CBC with Differential/Platelet   Ferritin   Atrial fibrillation ([facility])    Previously on coumadin.  Off now secondary to GI bleed.  Seeing cardiology.  Stable.  Follow.        Back pain    Stable.        Relevant Orders   Ambulatory referral to Physical Therapy   CAD (coronary artery disease)    Followed by cardiology.  Stable.        Dizziness    Persistent issue.  Discussed probably multifactorial.  Arrange vestibular rehab.        Relevant Orders   Ambulatory referral to Physical Therapy    Hypercholesteremia    On lipitor.  Low cholesterol diet and exercise.  Follow lipid anel and liver function tests.        Relevant Orders   Hepatic function panel   Lipid panel   Hypertension    Blood pressures as outlined.  Continue same medication regimen.  Follow pressures.  Follow metabolic panel.        Relevant Orders   Basic metabolic panel   Progressive supranuclear palsy [facility])    Seeing neurology.  On sinemet.  Has helped.  Still with dizziness and unsteadiness.  Refer to therapy.        Relevant Orders   Ambulatory referral to Physical Therapy   Thoracic aortic atherosclerosis ([place])    On lipitor.        Other Visit Diagnoses    Right foot pain    -  Primary   Persistent.  no injury or trauma.  check xray.  cannot appreciate good pulse.  no ulcer.  discussed vascular evaluation.     Relevant Orders   DG Foot 2 Views Right (Completed)       [name], MD

## 2017-02-07 ENCOUNTER — Encounter: Payer: Self-pay | Admitting: Internal Medicine

## 2017-02-07 DIAGNOSIS — R42 Dizziness and giddiness: Secondary | ICD-10-CM | POA: Insufficient documentation

## 2017-02-07 NOTE — Assessment & Plan Note (Signed)
Stable

## 2017-02-07 NOTE — Assessment & Plan Note (Signed)
On lipitor

## 2017-02-07 NOTE — Assessment & Plan Note (Signed)
Blood pressures as outlined.  Continue same medication regimen.  Follow pressures.  Follow metabolic panel.  

## 2017-02-07 NOTE — Assessment & Plan Note (Signed)
Previously on coumadin.  Off now secondary to GI bleed.  Seeing cardiology.  Stable.  Follow.

## 2017-02-07 NOTE — Assessment & Plan Note (Signed)
Persistent issue.  Discussed probably multifactorial.  Arrange vestibular rehab.

## 2017-02-07 NOTE — Assessment & Plan Note (Signed)
Has had GI w/up.  hgb slightly decreased.  Follow cbc and ferritin.

## 2017-02-07 NOTE — Assessment & Plan Note (Signed)
On lipitor.  Low cholesterol diet and exercise.  Follow lipid anel and liver function tests.

## 2017-02-07 NOTE — Assessment & Plan Note (Signed)
Seeing neurology.  On sinemet.  Has helped.  Still with dizziness and unsteadiness.  Refer to therapy.

## 2017-02-07 NOTE — Assessment & Plan Note (Signed)
Followed by cardiology. Stable.   

## 2017-02-08 ENCOUNTER — Other Ambulatory Visit: Payer: Self-pay | Admitting: Internal Medicine

## 2017-02-08 DIAGNOSIS — R0989 Other specified symptoms and signs involving the circulatory and respiratory systems: Secondary | ICD-10-CM

## 2017-02-08 DIAGNOSIS — M79671 Pain in right foot: Secondary | ICD-10-CM

## 2017-02-08 NOTE — Progress Notes (Signed)
Order placed for vascular surgery referral.   

## 2017-02-11 ENCOUNTER — Other Ambulatory Visit: Payer: Self-pay | Admitting: Internal Medicine

## 2017-02-11 DIAGNOSIS — M79671 Pain in right foot: Secondary | ICD-10-CM

## 2017-02-11 NOTE — Progress Notes (Signed)
Order placed for podiatry referral.   

## 2017-02-13 DIAGNOSIS — J309 Allergic rhinitis, unspecified: Secondary | ICD-10-CM | POA: Diagnosis not present

## 2017-02-13 DIAGNOSIS — R69 Illness, unspecified: Secondary | ICD-10-CM | POA: Diagnosis not present

## 2017-02-13 DIAGNOSIS — G8929 Other chronic pain: Secondary | ICD-10-CM | POA: Diagnosis not present

## 2017-02-13 DIAGNOSIS — G3184 Mild cognitive impairment, so stated: Secondary | ICD-10-CM | POA: Diagnosis not present

## 2017-02-13 DIAGNOSIS — I10 Essential (primary) hypertension: Secondary | ICD-10-CM | POA: Diagnosis not present

## 2017-02-13 DIAGNOSIS — H409 Unspecified glaucoma: Secondary | ICD-10-CM | POA: Diagnosis not present

## 2017-02-13 DIAGNOSIS — G629 Polyneuropathy, unspecified: Secondary | ICD-10-CM | POA: Diagnosis not present

## 2017-02-13 DIAGNOSIS — I4891 Unspecified atrial fibrillation: Secondary | ICD-10-CM | POA: Diagnosis not present

## 2017-02-13 DIAGNOSIS — I2510 Atherosclerotic heart disease of native coronary artery without angina pectoris: Secondary | ICD-10-CM | POA: Diagnosis not present

## 2017-02-13 DIAGNOSIS — E785 Hyperlipidemia, unspecified: Secondary | ICD-10-CM | POA: Diagnosis not present

## 2017-02-16 DIAGNOSIS — M722 Plantar fascial fibromatosis: Secondary | ICD-10-CM | POA: Diagnosis not present

## 2017-02-16 DIAGNOSIS — M2580 Other specified joint disorders, unspecified joint: Secondary | ICD-10-CM | POA: Diagnosis not present

## 2017-03-02 DIAGNOSIS — H401132 Primary open-angle glaucoma, bilateral, moderate stage: Secondary | ICD-10-CM | POA: Diagnosis not present

## 2017-03-09 ENCOUNTER — Telehealth: Payer: Self-pay

## 2017-03-09 NOTE — Telephone Encounter (Signed)
Copied from [place] [id number]. Topic: Inquiry >> Mar 09, 2017  4:07 PM Oliver Pila B wrote: Reason for CRM: pt's daughter called to state that pt has gotten the flu shot but wanted to know if there is anything further is needed or to be prescribed to prevent the pt from getting the flu, contact daughter to advise

## 2017-03-10 NOTE — Telephone Encounter (Signed)
Need more information as outlined in his wife's message.  I do recommend prophylactic tamiflu if lives with person diagnosed or other close contact.

## 2017-03-10 NOTE — Telephone Encounter (Signed)
Their grandson has the flu. He does not live with them but was around them on Sunday and did not feel well, now has been diagnosed with the flu.

## 2017-03-10 NOTE — Telephone Encounter (Signed)
Patient was exposed to the flu on Sunday and daughter is wanting to know if patient should take a course of tamiflu or some other type of treatment to prevent from getting the flu.

## 2017-03-10 NOTE — Telephone Encounter (Signed)
Spoke to pt.  He does not want to take the tamiflu.  He states he was not close to his grandson and desires not to take the medication at this time.  Will notify me if he changes his mind or if any problems.

## 2017-03-16 ENCOUNTER — Telehealth: Payer: Self-pay | Admitting: Internal Medicine

## 2017-03-16 NOTE — Telephone Encounter (Signed)
Copied from [place] [id number]. Topic: Quick Communication - See Telephone Encounter >> Mar 16, 2017 11:08 AM Burnis Medin, NT wrote: CRM for notification. See Telephone encounter for: Patient wife called and wanted to see if the doctor could give her a call regarding an appointment the doctor made.   03/16/17.

## 2017-03-18 DIAGNOSIS — R262 Difficulty in walking, not elsewhere classified: Secondary | ICD-10-CM | POA: Diagnosis not present

## 2017-03-18 DIAGNOSIS — H8143 Vertigo of central origin, bilateral: Secondary | ICD-10-CM | POA: Diagnosis not present

## 2017-03-18 NOTE — Telephone Encounter (Signed)
Pts wife is returning a call to [place]. Fransisco Beau stated Larena Glassman has left for the day. Pts wife aware.

## 2017-03-18 NOTE — Telephone Encounter (Signed)
LMTCB

## 2017-03-19 NOTE — Telephone Encounter (Signed)
Left message for patient to call back  

## 2017-03-25 DIAGNOSIS — I4891 Unspecified atrial fibrillation: Secondary | ICD-10-CM | POA: Diagnosis not present

## 2017-03-25 DIAGNOSIS — I951 Orthostatic hypotension: Secondary | ICD-10-CM | POA: Diagnosis not present

## 2017-03-25 DIAGNOSIS — E7800 Pure hypercholesterolemia, unspecified: Secondary | ICD-10-CM | POA: Diagnosis not present

## 2017-03-25 DIAGNOSIS — I2510 Atherosclerotic heart disease of native coronary artery without angina pectoris: Secondary | ICD-10-CM | POA: Diagnosis not present

## 2017-03-25 DIAGNOSIS — H8143 Vertigo of central origin, bilateral: Secondary | ICD-10-CM | POA: Diagnosis not present

## 2017-03-25 DIAGNOSIS — E782 Mixed hyperlipidemia: Secondary | ICD-10-CM | POA: Diagnosis not present

## 2017-03-25 DIAGNOSIS — R262 Difficulty in walking, not elsewhere classified: Secondary | ICD-10-CM | POA: Diagnosis not present

## 2017-03-25 DIAGNOSIS — R0989 Other specified symptoms and signs involving the circulatory and respiratory systems: Secondary | ICD-10-CM | POA: Diagnosis not present

## 2017-03-27 DIAGNOSIS — R262 Difficulty in walking, not elsewhere classified: Secondary | ICD-10-CM | POA: Diagnosis not present

## 2017-03-27 DIAGNOSIS — H8143 Vertigo of central origin, bilateral: Secondary | ICD-10-CM | POA: Diagnosis not present

## 2017-03-31 ENCOUNTER — Other Ambulatory Visit: Payer: Self-pay | Admitting: Internal Medicine

## 2017-03-31 DIAGNOSIS — R0989 Other specified symptoms and signs involving the circulatory and respiratory systems: Secondary | ICD-10-CM

## 2017-04-01 ENCOUNTER — Ambulatory Visit (INDEPENDENT_AMBULATORY_CARE_PROVIDER_SITE_OTHER): Payer: Medicare HMO

## 2017-04-01 ENCOUNTER — Encounter (INDEPENDENT_AMBULATORY_CARE_PROVIDER_SITE_OTHER): Payer: Self-pay | Admitting: Vascular Surgery

## 2017-04-01 ENCOUNTER — Ambulatory Visit (INDEPENDENT_AMBULATORY_CARE_PROVIDER_SITE_OTHER): Payer: Medicare HMO | Admitting: Vascular Surgery

## 2017-04-01 VITALS — BP 135/69 | HR 68 | Resp 16 | Ht 66.0 in | Wt 153.6 lb

## 2017-04-01 DIAGNOSIS — E785 Hyperlipidemia, unspecified: Secondary | ICD-10-CM

## 2017-04-01 DIAGNOSIS — I10 Essential (primary) hypertension: Secondary | ICD-10-CM | POA: Diagnosis not present

## 2017-04-01 DIAGNOSIS — R262 Difficulty in walking, not elsewhere classified: Secondary | ICD-10-CM | POA: Diagnosis not present

## 2017-04-01 DIAGNOSIS — R0989 Other specified symptoms and signs involving the circulatory and respiratory systems: Secondary | ICD-10-CM

## 2017-04-01 DIAGNOSIS — H8143 Vertigo of central origin, bilateral: Secondary | ICD-10-CM | POA: Diagnosis not present

## 2017-04-01 NOTE — Progress Notes (Signed)
Subjective:    [name]: Tyler Stout, male    DOB: [birth year], 84 y.o.   MRN: [id number] Chief Complaint  Patient presents with  . New Patient (Initial Visit)    ref Nicki Reaper for decreased pulses   Presents as a new patient referred by Dr. Nicki Reaper for decreased pedal pulses.  The patient was seen with his wife.  The patient presents today without complaint.  The patient denies any claudication-like symptoms, rest pain or ulceration to the bilateral lower extremity.  The patient endorses some right foot pain a few weeks ago however this is resolved.  The patient denies any lower extremity edema.  The patient was referred to [place] after his primary care physician was able to palpate pedal pulses on physical exam.  The patient underwent a bilateral ABI which was notable for medial calcification to the bilateral lower extremity however the bilateral Doppler waveforms and toe brachial indices suggest normal arterial perfusion to the bilateral lower extremities.  The patient has triphasic tibials bilaterally.  The patient denies any fever, nausea vomiting.   Review of Systems  Constitutional: Negative.   HENT: Negative.   Eyes: Negative.   Respiratory: Negative.   Cardiovascular:       Decreased pedal pulses on exam  Gastrointestinal: Negative.   Endocrine: Negative.   Genitourinary: Negative.   Musculoskeletal: Negative.   Skin: Negative.   Allergic/Immunologic: Negative.   Neurological: Negative.   Hematological: Negative.   Psychiatric/Behavioral: Negative.       Objective:   Physical Exam  Constitutional: He is oriented to person, place, and time. He appears well-developed and well-nourished. No distress.  HENT:  Head: Normocephalic and atraumatic.  Eyes: Conjunctivae are normal. Pupils are equal, round, and reactive to light.  Neck: Normal range of motion.  Cardiovascular: Normal rate, regular rhythm, normal heart sounds and intact distal pulses.  Pulses:      Radial pulses are  2+ on the right side, and 2+ on the left side.  Hard to palpate pedal pulses however the bilateral feet are warm.  Pulmonary/Chest: Effort normal.  Musculoskeletal: Normal range of motion. He exhibits no edema (No bilateral edema noted.  Patient is wearing medical grade 1 compression socks.).  Neurological: He is alert and oriented to person, place, and time.  Skin: Skin is warm and dry. He is not diaphoretic.  Psychiatric: He has a normal mood and affect. His behavior is normal. Judgment and thought content normal.  Vitals reviewed.  BP 135/69 (BP Location: Right Arm)   Pulse 68   Resp 16   Ht 5\' 6"  (1.676 m)   Wt 153 lb 9.6 oz (69.7 kg)   BMI 24.79 kg/m   Past Medical History:  Diagnosis Date  . Anemia    iron deficiency  . Arthritis   . Atrial fibrillation ([place])   . Bilateral renal cysts    worked up by Dr Bernardo Heater  . BPH (benign prostatic hypertrophy)   . CAD (coronary artery disease)    s/p bypass graft x 4 (1991)  . Diverticulosis   . GERD (gastroesophageal reflux disease)   . Glaucoma   . Hypercholesterolemia   . Hypertension   . Pancreatitis    x2 (1994 and 1998), presumed to be gallstone pancreatitis.  s/p ERCP and sphincterotomy   Social History   Socioeconomic History  . Marital status: Married    Spouse name: Not on file  . Number of children: Not on file  . Years of education: Not  on file  . Highest education level: Not on file  Social Needs  . Financial resource strain: Not on file  . Food insecurity - worry: Not on file  . Food insecurity - inability: Not on file  . Transportation needs - medical: Not on file  . Transportation needs - non-medical: Not on file  Occupational History  . Not on file  Tobacco Use  . Smoking status: Former Smoker    Last attempt to quit: 01/14/1964    Years since quitting: 53.2  . Smokeless tobacco: Never Used  Substance and Sexual Activity  . Alcohol use: No    Alcohol/week: 0.0 oz  . Drug use: No  . Sexual  activity: Not on file  Other Topics Concern  . Not on file  Social History Narrative  . Not on file   Past Surgical History:  Procedure Laterality Date  . APPENDECTOMY    . CHOLECYSTECTOMY  1994  . CORONARY ARTERY BYPASS GRAFT     x 4,  1991  . INGUINAL HERNIA REPAIR  2009  . open heart surgery  1991   Family History  Problem Relation Age of Onset  . Throat cancer Father        oropharyngeal cancer  . Kidney disease Sister   . Heart disease Unknown   . Colon cancer Neg Hx   . Prostate cancer Neg Hx    Allergies  Allergen Reactions  . Ilosone [Erythromycin] Other (See Comments)    Reaction:  GI upset   . Penicillins Rash and Other (See Comments)    Has patient had a PCN reaction causing immediate rash, facial/tongue/throat swelling, SOB or lightheadedness with hypotension: No Has patient had a PCN reaction causing severe rash involving mucus membranes or skin necrosis: No Has patient had a PCN reaction that required hospitalization No Has patient had a PCN reaction occurring within the last 10 years: No If all of the above answers are "NO", then may proceed with Cephalosporin use.      Assessment & Plan:  Presents as a new patient referred by Dr. Nicki Reaper for decreased pedal pulses.  The patient was seen with his wife.  The patient presents today without complaint.  The patient denies any claudication-like symptoms, rest pain or ulceration to the bilateral lower extremity.  The patient endorses some right foot pain a few weeks ago however this is resolved.  The patient denies any lower extremity edema.  The patient was referred to [place] after his primary care physician was able to palpate pedal pulses on physical exam.  The patient underwent a bilateral ABI which was notable for medial calcification to the bilateral lower extremity however the bilateral Doppler waveforms and toe brachial indices suggest normal arterial perfusion to the bilateral lower extremities.  The patient has  triphasic tibials bilaterally.  The patient denies any fever, nausea vomiting.  1. Decreased pedal pulses - New Patient with normal arterial perfusion to the bilateral lower extremities on ABI today. Decreased pedal pulses most likely due to medial calcification of the arteries and possibly anatomical in nature. The patient presents today asymptomatically Pedal pulses are hard to palpate on exam however his bilateral feet are warm No indication for intervention at this time The patient is to follow-up as needed  2. Essential hypertension - Stable Encouraged good control as its slows the progression of atherosclerotic disease  3. Hyperlipidemia, unspecified hyperlipidemia type - Stable Encouraged good control as its slows the progression of atherosclerotic disease  Current Outpatient  Medications on File Prior to Visit  Medication Sig Dispense Refill  . amLODipine (NORVASC) 5 MG tablet TAKE 1 TABLET (5 MG TOTAL) BY MOUTH DAILY. 30 tablet 5  . Ascorbic Acid (VITAMIN C PO) Take 1 tablet by mouth daily. Reported on 06/20/2015    . aspirin EC 81 MG tablet Take 81 mg by mouth daily.    Marland Kitchen atorvastatin (LIPITOR) 20 MG tablet TAKE 1 TABLET (20 MG TOTAL) BY MOUTH DAILY. 90 tablet 3  . calcium carbonate (TUMS) 500 MG chewable tablet Chew 1 tablet (200 mg of elemental calcium total) by mouth daily. 10 tablet 3  . citalopram (CELEXA) 20 MG tablet TAKE 1 TABLET (20 MG TOTAL) BY MOUTH DAILY. 30 tablet 3  . fluticasone (FLONASE) 50 MCG/ACT nasal spray Place 2 sprays into both nostrils daily. 16 g 3  . furosemide (LASIX) 20 MG tablet Take 20 mg by mouth daily as needed.    . latanoprost (XALATAN) 0.005 % ophthalmic solution Place 1 drop into both eyes at bedtime.     Marland Kitchen MAGNESIUM PO Take 1 tablet by mouth daily. Reported on 06/20/2015    . metoprolol tartrate (LOPRESSOR) 25 MG tablet TAKE 1 TABLET BY MOUTH TWICE A DAY    . Multiple Vitamin (MULTIVITAMIN WITH MINERALS) TABS tablet Take 1 tablet by mouth daily.     . pantoprazole (PROTONIX) 40 MG tablet Take 1 tablet (40 mg total) by mouth daily. 30 tablet 3  . tamsulosin (FLOMAX) 0.4 MG CAPS capsule TAKE 1 CAPSULE (0.4 MG TOTAL) BY MOUTH DAILY. 30 capsule 5   No current facility-administered medications on file prior to visit.    There are no Patient Instructions on file for this visit. No Follow-up on file.  KIMBERLY A STEGMAYER, PA-C

## 2017-04-03 DIAGNOSIS — R262 Difficulty in walking, not elsewhere classified: Secondary | ICD-10-CM | POA: Diagnosis not present

## 2017-04-03 DIAGNOSIS — H8143 Vertigo of central origin, bilateral: Secondary | ICD-10-CM | POA: Diagnosis not present

## 2017-04-03 DIAGNOSIS — I6523 Occlusion and stenosis of bilateral carotid arteries: Secondary | ICD-10-CM | POA: Diagnosis not present

## 2017-04-03 DIAGNOSIS — R0989 Other specified symptoms and signs involving the circulatory and respiratory systems: Secondary | ICD-10-CM | POA: Diagnosis not present

## 2017-04-08 DIAGNOSIS — H8143 Vertigo of central origin, bilateral: Secondary | ICD-10-CM | POA: Diagnosis not present

## 2017-04-08 DIAGNOSIS — R262 Difficulty in walking, not elsewhere classified: Secondary | ICD-10-CM | POA: Diagnosis not present

## 2017-04-10 DIAGNOSIS — H8143 Vertigo of central origin, bilateral: Secondary | ICD-10-CM | POA: Diagnosis not present

## 2017-04-10 DIAGNOSIS — R262 Difficulty in walking, not elsewhere classified: Secondary | ICD-10-CM | POA: Diagnosis not present

## 2017-04-15 DIAGNOSIS — H8143 Vertigo of central origin, bilateral: Secondary | ICD-10-CM | POA: Diagnosis not present

## 2017-04-15 DIAGNOSIS — R262 Difficulty in walking, not elsewhere classified: Secondary | ICD-10-CM | POA: Diagnosis not present

## 2017-04-17 ENCOUNTER — Other Ambulatory Visit: Payer: Self-pay | Admitting: Internal Medicine

## 2017-04-19 IMAGING — US IR ANGIO/VISCERAL SELECTIVE EA VESSEL WO/W FLUSH
1 series · 1 of 1 positions shown · non-contrast
Comparison: none

CLINICAL DATA: Acute lower GI bleed. Positive tagged red blood cell
study localizing to the ascending colon. Unstable on pressors.
Recent Coumadin use, partially reversed.

[Series 1: ir (id) (id)/(id)/(id) ir · 1 of 1 slices shown]
[im 1/1]
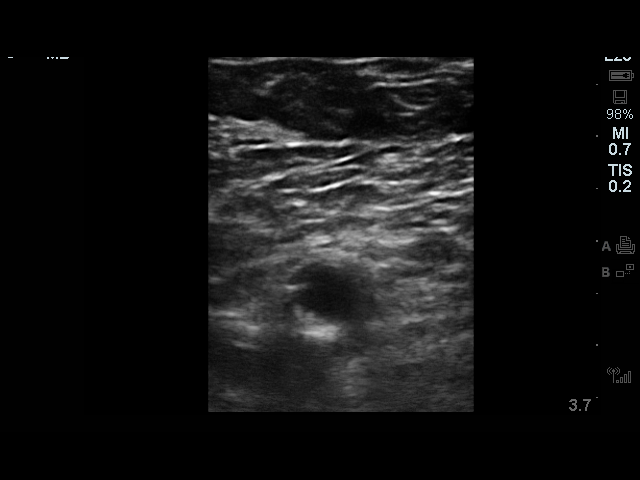

[1 of 1 positions shown; findings below may reference images not displayed]

EXAM:
SELECTIVE VISCERAL ARTERIOGRAPHY; IR ULTRASOUND GUIDANCE VASC ACCESS
LEFT

ANESTHESIA/SEDATION:
none

MEDICATIONS:
Lidocaine 1% subcutaneous

CONTRAST:  105mL OMNIPAQUE IOHEXOL 300 MG/ML SOLN, 35mL [id number]-[id number]
IOPAMIDOL (IQ2LPQ-[id number]) INJECTION 61%

PROCEDURE:
The procedure, risks (including but not limited to bleeding,
infection, organ damage ), benefits, and alternatives were explained
to the patient and family. Questions regarding the procedure were
encouraged and answered. The patient understands and consents to the
procedure.

Review of previous CT demonstrated significant atheromatous change
in the right iliac arterial system, so left-sided approach was
selected. The left femoral region prepped and draped in usual
sterile fashion. Maximal barrier sterile technique was utilized
including caps, mask, sterile gowns, sterile gloves, sterile drape,
hand hygiene and skin antiseptic.

The left common femoral artery was localized under ultrasound,
patency confirmed and documented. Under real-time ultrasound
guidance, the vessel was accessed with a 21-gauge micropuncture
needle, exchanged over a 018 guidewire for a transitional dilator,
through which a 035 guidewire was advanced. Over this, a 5 French
vascular sheath was placed, through which a 5 French C2 catheter was
advanced and used to selectively catheterize the superior mesenteric
artery for selective arteriography in multiple projections.

A RIM catheter was then utilized to selectively catheterize the
inferior mesenteric artery for selective arteriography in multiple
projections.

The C2 was then utilized to selectively catheterize the celiac axis
for selective arteriography.

The catheter was removed. The sheath was secured externally with 0
Prolene suture and placed to saline flush. Site covered with a
sterile dressing.

The patient tolerated the procedure well.

COMPLICATIONS:
None immediate
FINDINGS: No evidence of active extravasation, early draining vein, AVM, or
other lesion to suggest a site or etiology of the patient's GI
bleeding.

Venous phase confirms patency of the SMV, IMV and portal venous
system.
IMPRESSION: 1. Negative three-vessel mesenteric arteriogram. No evidence of
active extravasation or other focal lesion to suggest etiology of GI
bleed.
Critical Value/emergent results were called by telephone at the
conclusion of the procedure on 06/09/2015 at [DATE] to Dr. DUTT
PADAM , who verbally acknowledged these results.

## 2017-04-27 DIAGNOSIS — R262 Difficulty in walking, not elsewhere classified: Secondary | ICD-10-CM | POA: Diagnosis not present

## 2017-04-27 DIAGNOSIS — H8143 Vertigo of central origin, bilateral: Secondary | ICD-10-CM | POA: Diagnosis not present

## 2017-05-02 ENCOUNTER — Other Ambulatory Visit: Payer: Self-pay | Admitting: Internal Medicine

## 2017-05-05 ENCOUNTER — Other Ambulatory Visit (INDEPENDENT_AMBULATORY_CARE_PROVIDER_SITE_OTHER): Payer: Medicare HMO

## 2017-05-05 DIAGNOSIS — E7800 Pure hypercholesterolemia, unspecified: Secondary | ICD-10-CM

## 2017-05-05 DIAGNOSIS — I10 Essential (primary) hypertension: Secondary | ICD-10-CM

## 2017-05-05 DIAGNOSIS — D508 Other iron deficiency anemias: Secondary | ICD-10-CM | POA: Diagnosis not present

## 2017-05-05 LAB — BASIC METABOLIC PANEL
BUN: 16 mg/dL (ref 6–23)
CO2: 29 mEq/L (ref 19–32)
Calcium: 9 mg/dL (ref 8.4–10.5)
Chloride: 101 mEq/L (ref 96–112)
Creatinine, Ser: 0.81 mg/dL (ref 0.40–1.50)
GFR: 95.19 mL/min (ref >60.00)
Glucose, Bld: 100 mg/dL — ABNORMAL HIGH (ref 70–99)
POTASSIUM: 4.3 meq/L (ref 3.5–5.1)
Sodium: 135 mEq/L (ref 135–145)

## 2017-05-05 LAB — CBC WITH DIFFERENTIAL/PLATELET
BASOS PCT: 0.6 % (ref 0.0–3.0)
Basophils Absolute: 0 10*3/uL (ref 0.0–0.1)
EOS PCT: 3.5 % (ref 0.0–5.0)
Eosinophils Absolute: 0.2 10*3/uL (ref 0.0–0.7)
HEMATOCRIT: 38.4 % — AB (ref 39.0–52.0)
Hemoglobin: 12.9 g/dL — ABNORMAL LOW (ref 13.0–17.0)
LYMPHS ABS: 1.9 10*3/uL (ref 0.7–4.0)
Lymphocytes Relative: 28.7 % (ref 12.0–46.0)
MCHC: 33.6 g/dL (ref 30.0–36.0)
MCV: 93.4 fl (ref 78.0–100.0)
MONOS PCT: 14.1 % — AB (ref 3.0–12.0)
Monocytes Absolute: 0.9 10*3/uL (ref 0.1–1.0)
NEUTROS ABS: 3.5 10*3/uL (ref 1.4–7.7)
Neutrophils Relative %: 53.1 % (ref 43.0–77.0)
PLATELETS: 210 10*3/uL (ref 150.0–400.0)
RBC: 4.12 Mil/uL — ABNORMAL LOW (ref 4.22–5.81)
RDW: 15.3 % (ref 11.5–15.5)
WBC: 6.5 10*3/uL (ref 4.0–10.5)

## 2017-05-05 LAB — LIPID PANEL
Cholesterol: 134 mg/dL (ref 0–200)
HDL: 45 mg/dL (ref >39.00)
LDL Cholesterol: 58 mg/dL (ref 0–99)
NONHDL: 88.89
Total CHOL/HDL Ratio: 3
Triglycerides: 155 mg/dL — ABNORMAL HIGH (ref 0.0–149.0)
VLDL: 31 mg/dL (ref 0.0–40.0)

## 2017-05-05 LAB — HEPATIC FUNCTION PANEL
ALT: 6 U/L (ref 0–53)
AST: 25 U/L (ref 0–37)
Albumin: 4.1 g/dL (ref 3.5–5.2)
Alkaline Phosphatase: 94 U/L (ref 39–117)
BILIRUBIN TOTAL: 0.5 mg/dL (ref 0.2–1.2)
Bilirubin, Direct: 0.1 mg/dL (ref 0.0–0.3)
Total Protein: 6.6 g/dL (ref 6.0–8.3)

## 2017-05-05 LAB — FERRITIN: Ferritin: 62.8 ng/mL (ref 22.0–322.0)

## 2017-05-07 ENCOUNTER — Ambulatory Visit (INDEPENDENT_AMBULATORY_CARE_PROVIDER_SITE_OTHER): Payer: Medicare HMO | Admitting: Internal Medicine

## 2017-05-07 VITALS — BP 128/62 | HR 67 | Temp 97.6°F | Resp 18 | Wt 155.4 lb

## 2017-05-07 DIAGNOSIS — I700 Atherosclerosis of aorta: Secondary | ICD-10-CM

## 2017-05-07 DIAGNOSIS — I2510 Atherosclerotic heart disease of native coronary artery without angina pectoris: Secondary | ICD-10-CM | POA: Diagnosis not present

## 2017-05-07 DIAGNOSIS — D508 Other iron deficiency anemias: Secondary | ICD-10-CM | POA: Diagnosis not present

## 2017-05-07 DIAGNOSIS — I4891 Unspecified atrial fibrillation: Secondary | ICD-10-CM | POA: Diagnosis not present

## 2017-05-07 DIAGNOSIS — G231 Progressive supranuclear ophthalmoplegia [Steele-Richardson-Olszewski]: Secondary | ICD-10-CM

## 2017-05-07 DIAGNOSIS — I10 Essential (primary) hypertension: Secondary | ICD-10-CM

## 2017-05-07 DIAGNOSIS — R0989 Other specified symptoms and signs involving the circulatory and respiratory systems: Secondary | ICD-10-CM | POA: Diagnosis not present

## 2017-05-07 DIAGNOSIS — E7800 Pure hypercholesterolemia, unspecified: Secondary | ICD-10-CM

## 2017-05-07 DIAGNOSIS — R739 Hyperglycemia, unspecified: Secondary | ICD-10-CM

## 2017-05-07 NOTE — Progress Notes (Signed)
[name]: Tyler Stout, male   DOB: 1926/01/09, 84 y.o.   MRN: [id number]   Subjective:    [name]: Tyler Stout, male    DOB: March 17, 1925, 84 y.o.   MRN: [id number]  HPI  Patient here for a scheduled follow up.  He is accompanied by his wife.  History obtained from both of them.  Was recently evaluated by [facility] for decreased pedal pulses.  ABIs ok.  Trying to stay active.  Still with dizziness.  Some days worse than others.  Has had extensive w/up.  Just saw cardiology.  Note reviewed.  ECHO 45-50% with mild to moderate pulmonary hypertension.  Carotid ultrasound revealed no significant stenosis.  Felt no further w/up warranted.  Recommended f/u in 6 months.  No chest pain.  Breathing stable.  Eating well.  No abdominal pain or cramping.  Bowels moving.  Doing well with anxiety.  Will taper off citalopram.     Past Medical History:  Diagnosis Date  . Anemia    iron deficiency  . Arthritis   . Atrial fibrillation ([place])   . Bilateral renal cysts    worked up by Dr Bernardo Heater  . BPH (benign prostatic hypertrophy)   . CAD (coronary artery disease)    s/p bypass graft x 4 (1991)  . Diverticulosis   . GERD (gastroesophageal reflux disease)   . Glaucoma   . Hypercholesterolemia   . Hypertension   . Pancreatitis    x2 (1994 and 1998), presumed to be gallstone pancreatitis.  s/p ERCP and sphincterotomy   Past Surgical History:  Procedure Laterality Date  . APPENDECTOMY    . CHOLECYSTECTOMY  1994  . CORONARY ARTERY BYPASS GRAFT     x 4,  1991  . INGUINAL HERNIA REPAIR  2009  . open heart surgery  1991   Family History  Problem Relation Age of Onset  . Throat cancer Father        oropharyngeal cancer  . Kidney disease Sister   . Heart disease Unknown   . Colon cancer Neg Hx   . Prostate cancer Neg Hx    Social History   Socioeconomic History  . Marital status: Married    Spouse name: Not on file  . Number of children: Not on file  . Years of education: Not on file  .  Highest education level: Not on file  Occupational History  . Not on file  Social Needs  . Financial resource strain: Not on file  . Food insecurity:    Worry: Not on file    Inability: Not on file  . Transportation needs:    Medical: Not on file    Non-medical: Not on file  Tobacco Use  . Smoking status: Former Smoker    Last attempt to quit: 01/14/1964    Years since quitting: 53.3  . Smokeless tobacco: Never Used  Substance and Sexual Activity  . Alcohol use: No    Alcohol/week: 0.0 oz  . Drug use: No  . Sexual activity: Not on file  Lifestyle  . Physical activity:    Days per week: Not on file    Minutes per session: Not on file  . Stress: Not on file  Relationships  . Social connections:    Talks on phone: Not on file    Gets together: Not on file    Attends religious service: Not on file    Active member of club or organization: Not on file    Attends  meetings of clubs or organizations: Not on file    Relationship status: Not on file  Other Topics Concern  . Not on file  Social History Narrative  . Not on file    Outpatient Encounter Medications as of 05/07/2017  Medication Sig  . amLODipine (NORVASC) 5 MG tablet TAKE 1 TABLET (5 MG TOTAL) BY MOUTH DAILY.  Marland Kitchen Ascorbic Acid (VITAMIN C PO) Take 1 tablet by mouth daily. Reported on 06/20/2015  . aspirin EC 81 MG tablet Take 81 mg by mouth daily.  Marland Kitchen atorvastatin (LIPITOR) 20 MG tablet TAKE 1 TABLET (20 MG TOTAL) BY MOUTH DAILY.  . calcium carbonate (TUMS) 500 MG chewable tablet Chew 1 tablet (200 mg of elemental calcium total) by mouth daily.  . carbidopa-levodopa (SINEMET IR) 25-100 MG tablet TAKE 2 TABLETS BY MOUTH 3 TIMES DAILY  . citalopram (CELEXA) 20 MG tablet TAKE 1 TABLET (20 MG TOTAL) BY MOUTH DAILY.  . fluticasone (FLONASE) 50 MCG/ACT nasal spray SPRAY 2 SPRAYS INTO EACH NOSTRIL EVERY DAY  . furosemide (LASIX) 20 MG tablet Take 20 mg by mouth daily as needed.  . latanoprost (XALATAN) 0.005 % ophthalmic  solution Place 1 drop into both eyes at bedtime.   Marland Kitchen MAGNESIUM PO Take 1 tablet by mouth daily. Reported on 06/20/2015  . metoprolol tartrate (LOPRESSOR) 25 MG tablet TAKE 1 TABLET BY MOUTH TWICE A DAY  . Multiple Vitamin (MULTIVITAMIN WITH MINERALS) TABS tablet Take 1 tablet by mouth daily.  . pantoprazole (PROTONIX) 40 MG tablet Take 1 tablet (40 mg total) by mouth daily.  . tamsulosin (FLOMAX) 0.4 MG CAPS capsule TAKE 1 CAPSULE (0.4 MG TOTAL) BY MOUTH DAILY.   No facility-administered encounter medications on file as of 05/07/2017.     Review of Systems  Constitutional: Negative for appetite change and unexpected weight change.  HENT: Negative for congestion and sinus pressure.   Respiratory: Negative for cough, chest tightness and shortness of breath.   Cardiovascular: Negative for chest pain, palpitations and leg swelling.  Gastrointestinal: Negative for abdominal pain, diarrhea, nausea and vomiting.  Genitourinary: Negative for difficulty urinating and dysuria.  Musculoskeletal: Negative for joint swelling and myalgias.  Skin: Negative for color change and rash.  Neurological: Positive for dizziness. Negative for headaches.  Psychiatric/Behavioral: Negative for agitation and dysphoric mood.       Objective:     Blood pressure rechecked by me:  122/64  Physical Exam  Constitutional: He appears well-developed and well-nourished. No distress.  HENT:  Nose: Nose normal.  Mouth/Throat: Oropharynx is clear and moist.  Neck: Neck supple. No thyromegaly present.  Cardiovascular: Normal rate.  Pulmonary/Chest: Effort normal and breath sounds normal. No respiratory distress.  Abdominal: Soft. Bowel sounds are normal. There is no tenderness.  Musculoskeletal: He exhibits no edema or tenderness.  Lymphadenopathy:    He has no cervical adenopathy.  Skin: No rash noted. No erythema.  Psychiatric: He has a normal mood and affect. His behavior is normal.    BP 128/62 (BP Location: Left  Arm, Patient Position: Sitting, Cuff Size: Normal)   Pulse 67   Temp 97.6 F (36.4 C) (Oral)   Resp 18   Wt 155 lb 6.4 oz (70.5 kg)   SpO2 99%   BMI 25.08 kg/m  Wt Readings from Last 3 Encounters:  05/07/17 155 lb 6.4 oz (70.5 kg)  04/01/17 153 lb 9.6 oz (69.7 kg)  02/04/17 162 lb 6.4 oz (73.7 kg)     Lab Results  Component Value Date  WBC 6.5 05/05/2017   HGB 12.9 (L) 05/05/2017   HCT 38.4 (L) 05/05/2017   PLT 210.0 05/05/2017   GLUCOSE 100 (H) 05/05/2017   CHOL 134 05/05/2017   TRIG 155.0 (H) 05/05/2017   HDL 45.00 05/05/2017   LDLCALC 58 05/05/2017   ALT 6 05/05/2017   AST 25 05/05/2017   NA 135 05/05/2017   K 4.3 05/05/2017   CL 101 05/05/2017   CREATININE 0.81 05/05/2017   BUN 16 05/05/2017   CO2 29 05/05/2017   TSH 1.50 02/03/2017   INR 1.20 06/14/2015   HGBA1C 6.2 03/15/2012    Dg Chest 2 View  Result Date: 07/06/2016 CLINICAL DATA:  Productive cough beginning yesterday. EXAM: CHEST  2 VIEW COMPARISON:  06/05/2015 FINDINGS: Sternotomy wires unchanged. Lungs are adequately inflated without focal airspace consolidation or effusion. Calcification along the left hemidiaphragm unchanged. Symmetric small round nodular densities over the lung bases likely the nipples. Stable borderline cardiomegaly. Calcified plaque over the thoracic aorta. Mild degenerate change of the spine. Flattening of the hemidiaphragms on the lateral film. IMPRESSION: No acute cardiopulmonary disease. Stable pleural calcification of the left hemidiaphragm. Symmetric small round nodular densities likely be nipples projected over the lung bases. Recommend follow-up PA chest radiograph with nipple markers for confirmation. Electronically Signed   By: Marin Olp M.D.   On: 07/06/2016 10:06       Assessment & Plan:   Problem List Items Addressed This Visit    Anemia    Has had GI w/up.  Recent hgb improved. Follow cbc.       Relevant Orders   CBC with Differential/Platelet   Ferritin    Atrial fibrillation ([facility])    Off coumadin secondary to GI bleed.  Followed by cardiology.  Stable.        CAD (coronary artery disease)    Followed by cardiology.  Stable.       Decreased pedal pulses    Evaluated by [facility].  ABIs normal perfusion.        Essential hypertension    Blood pressure under good control.  Continue same medication regimen.  Follow pressures.  Follow metabolic panel.        Relevant Orders   Basic metabolic panel   Hypercholesteremia    On lipitor.  Follow lipid panel and liver function tests.   Lab Results  Component Value Date   CHOL 134 05/05/2017   HDL 45.00 05/05/2017   LDLCALC 58 05/05/2017   TRIG 155.0 (H) 05/05/2017   CHOLHDL 3 05/05/2017        Relevant Orders   Hepatic function panel   Lipid panel   Progressive supranuclear palsy ([place])    Followed by neurology.  On sinemet.  Gait improved.  Follow.        Thoracic aortic atherosclerosis ([facility])    On lipitor.         Other Visit Diagnoses    Hyperglycemia    -  Primary   Relevant Orders   Hemoglobin A1c       [name], MD

## 2017-05-09 ENCOUNTER — Encounter: Payer: Self-pay | Admitting: Internal Medicine

## 2017-05-09 NOTE — Assessment & Plan Note (Signed)
Blood pressure under good control.  Continue same medication regimen.  Follow pressures.  Follow metabolic panel.   

## 2017-05-09 NOTE — Assessment & Plan Note (Signed)
Has had GI w/up.  Recent hgb improved. Follow cbc.

## 2017-05-09 NOTE — Assessment & Plan Note (Signed)
Off coumadin secondary to GI bleed.  Followed by cardiology.  Stable.   

## 2017-05-09 NOTE — Assessment & Plan Note (Signed)
Followed by neurology.  On sinemet.  Gait improved.  Follow.

## 2017-05-09 NOTE — Assessment & Plan Note (Signed)
On lipitor

## 2017-05-09 NOTE — Assessment & Plan Note (Signed)
On lipitor.  Follow lipid panel and liver function tests.   Lab Results  Component Value Date   CHOL 134 05/05/2017   HDL 45.00 05/05/2017   LDLCALC 58 05/05/2017   TRIG 155.0 (H) 05/05/2017   CHOLHDL 3 05/05/2017

## 2017-05-09 NOTE — Assessment & Plan Note (Signed)
Followed by cardiology. Stable.   

## 2017-05-09 NOTE — Assessment & Plan Note (Signed)
Evaluated by [facility].  ABIs normal perfusion.

## 2017-05-11 ENCOUNTER — Other Ambulatory Visit: Payer: Self-pay | Admitting: Internal Medicine

## 2017-05-12 DIAGNOSIS — H8143 Vertigo of central origin, bilateral: Secondary | ICD-10-CM | POA: Diagnosis not present

## 2017-05-12 DIAGNOSIS — R262 Difficulty in walking, not elsewhere classified: Secondary | ICD-10-CM | POA: Diagnosis not present

## 2017-05-22 DIAGNOSIS — M48061 Spinal stenosis, lumbar region without neurogenic claudication: Secondary | ICD-10-CM | POA: Diagnosis not present

## 2017-05-22 DIAGNOSIS — G231 Progressive supranuclear ophthalmoplegia [Steele-Richardson-Olszewski]: Secondary | ICD-10-CM | POA: Diagnosis not present

## 2017-05-22 DIAGNOSIS — H8110 Benign paroxysmal vertigo, unspecified ear: Secondary | ICD-10-CM | POA: Diagnosis not present

## 2017-05-26 DIAGNOSIS — R262 Difficulty in walking, not elsewhere classified: Secondary | ICD-10-CM | POA: Diagnosis not present

## 2017-05-26 DIAGNOSIS — H8143 Vertigo of central origin, bilateral: Secondary | ICD-10-CM | POA: Diagnosis not present

## 2017-06-05 ENCOUNTER — Other Ambulatory Visit: Payer: Self-pay | Admitting: Internal Medicine

## 2017-06-12 ENCOUNTER — Other Ambulatory Visit: Payer: Self-pay | Admitting: Internal Medicine

## 2017-07-25 ENCOUNTER — Other Ambulatory Visit: Payer: Self-pay | Admitting: Internal Medicine

## 2017-08-31 DIAGNOSIS — H02052 Trichiasis without entropian right lower eyelid: Secondary | ICD-10-CM | POA: Diagnosis not present

## 2017-09-01 DIAGNOSIS — M5136 Other intervertebral disc degeneration, lumbar region: Secondary | ICD-10-CM | POA: Diagnosis not present

## 2017-09-01 DIAGNOSIS — M48062 Spinal stenosis, lumbar region with neurogenic claudication: Secondary | ICD-10-CM | POA: Diagnosis not present

## 2017-09-01 DIAGNOSIS — M5416 Radiculopathy, lumbar region: Secondary | ICD-10-CM | POA: Diagnosis not present

## 2017-09-03 ENCOUNTER — Other Ambulatory Visit (INDEPENDENT_AMBULATORY_CARE_PROVIDER_SITE_OTHER): Payer: Medicare HMO

## 2017-09-03 DIAGNOSIS — E7800 Pure hypercholesterolemia, unspecified: Secondary | ICD-10-CM | POA: Diagnosis not present

## 2017-09-03 DIAGNOSIS — D508 Other iron deficiency anemias: Secondary | ICD-10-CM | POA: Diagnosis not present

## 2017-09-03 DIAGNOSIS — I10 Essential (primary) hypertension: Secondary | ICD-10-CM | POA: Diagnosis not present

## 2017-09-03 DIAGNOSIS — R739 Hyperglycemia, unspecified: Secondary | ICD-10-CM | POA: Diagnosis not present

## 2017-09-03 LAB — HEPATIC FUNCTION PANEL
ALBUMIN: 4.4 g/dL (ref 3.5–5.2)
ALK PHOS: 70 U/L (ref 39–117)
ALT: 12 U/L (ref 0–53)
AST: 20 U/L (ref 0–37)
Bilirubin, Direct: 0.1 mg/dL (ref 0.0–0.3)
Total Bilirubin: 0.7 mg/dL (ref 0.2–1.2)
Total Protein: 7 g/dL (ref 6.0–8.3)

## 2017-09-03 LAB — CBC WITH DIFFERENTIAL/PLATELET
BASOS ABS: 0 10*3/uL (ref 0.0–0.1)
Basophils Relative: 0.2 % (ref 0.0–3.0)
EOS PCT: 0 % (ref 0.0–5.0)
Eosinophils Absolute: 0 10*3/uL (ref 0.0–0.7)
HEMATOCRIT: 39.3 % (ref 39.0–52.0)
Hemoglobin: 13.2 g/dL (ref 13.0–17.0)
LYMPHS PCT: 15.3 % (ref 12.0–46.0)
Lymphs Abs: 1.4 10*3/uL (ref 0.7–4.0)
MCHC: 33.5 g/dL (ref 30.0–36.0)
MCV: 96.2 fl (ref 78.0–100.0)
MONOS PCT: 9.9 % (ref 3.0–12.0)
Monocytes Absolute: 0.9 10*3/uL (ref 0.1–1.0)
Neutro Abs: 6.9 10*3/uL (ref 1.4–7.7)
Neutrophils Relative %: 74.6 % (ref 43.0–77.0)
Platelets: 209 10*3/uL (ref 150.0–400.0)
RBC: 4.09 Mil/uL — AB (ref 4.22–5.81)
RDW: 15.1 % (ref 11.5–15.5)
WBC: 9.2 10*3/uL (ref 4.0–10.5)

## 2017-09-03 LAB — LIPID PANEL
CHOL/HDL RATIO: 2
Cholesterol: 128 mg/dL (ref 0–200)
HDL: 53.4 mg/dL (ref >39.00)
LDL CALC: 56 mg/dL (ref 0–99)
NONHDL: 74.3
TRIGLYCERIDES: 91 mg/dL (ref 0.0–149.0)
VLDL: 18.2 mg/dL (ref 0.0–40.0)

## 2017-09-03 LAB — FERRITIN: Ferritin: 54.6 ng/mL (ref 22.0–322.0)

## 2017-09-03 LAB — BASIC METABOLIC PANEL
BUN: 25 mg/dL — AB (ref 6–23)
CALCIUM: 9.4 mg/dL (ref 8.4–10.5)
CO2: 28 meq/L (ref 19–32)
Chloride: 102 mEq/L (ref 96–112)
Creatinine, Ser: 0.93 mg/dL (ref 0.40–1.50)
GFR: 81.1 mL/min (ref >60.00)
GLUCOSE: 90 mg/dL (ref 70–99)
Potassium: 4.2 mEq/L (ref 3.5–5.1)
SODIUM: 136 meq/L (ref 135–145)

## 2017-09-03 LAB — HEMOGLOBIN A1C: Hgb A1c MFr Bld: 5.8 % (ref 4.6–6.5)

## 2017-09-08 ENCOUNTER — Ambulatory Visit (INDEPENDENT_AMBULATORY_CARE_PROVIDER_SITE_OTHER): Payer: Medicare HMO | Admitting: Internal Medicine

## 2017-09-08 VITALS — BP 130/78 | HR 67 | Temp 97.8°F | Resp 18 | Wt 154.6 lb

## 2017-09-08 DIAGNOSIS — I700 Atherosclerosis of aorta: Secondary | ICD-10-CM | POA: Diagnosis not present

## 2017-09-08 DIAGNOSIS — I4891 Unspecified atrial fibrillation: Secondary | ICD-10-CM

## 2017-09-08 DIAGNOSIS — D508 Other iron deficiency anemias: Secondary | ICD-10-CM | POA: Diagnosis not present

## 2017-09-08 DIAGNOSIS — R42 Dizziness and giddiness: Secondary | ICD-10-CM

## 2017-09-08 DIAGNOSIS — R739 Hyperglycemia, unspecified: Secondary | ICD-10-CM

## 2017-09-08 DIAGNOSIS — I10 Essential (primary) hypertension: Secondary | ICD-10-CM

## 2017-09-08 DIAGNOSIS — E7800 Pure hypercholesterolemia, unspecified: Secondary | ICD-10-CM | POA: Diagnosis not present

## 2017-09-08 DIAGNOSIS — I2510 Atherosclerotic heart disease of native coronary artery without angina pectoris: Secondary | ICD-10-CM

## 2017-09-08 DIAGNOSIS — G231 Progressive supranuclear ophthalmoplegia [Steele-Richardson-Olszewski]: Secondary | ICD-10-CM

## 2017-09-08 DIAGNOSIS — R0981 Nasal congestion: Secondary | ICD-10-CM | POA: Diagnosis not present

## 2017-09-08 NOTE — Progress Notes (Signed)
[name]: Tyler Stout, male   DOB: [birth year], 84 y.o.   MRN: [id number]   Subjective:    [name]: Tyler Stout, male    DOB: [birth year], 83 y.o.   MRN: [id number]  HPI  Patient here for a scheduled follow up.  He is accompanied by his wife.  History obtained from both of them.  Reports some persistent dizziness/unsteadiness.  After questioning, it appears it is more unsteadiness. No recent falls.  Sees neurology.  Feels symptoms related to progressive supranuclear palsy.  No headache.  Some am congestion.  Using flonase at night.  Discussed using regularly.  No chest congestion.  No sob.  No abdominal pain.  No chest pain.  Bowels moving.  Wife feels he is doing better.     Past Medical History:  Diagnosis Date  . Anemia    iron deficiency  . Arthritis   . Atrial fibrillation ([place])   . Bilateral renal cysts    worked up by Dr Bernardo Heater  . BPH (benign prostatic hypertrophy)   . CAD (coronary artery disease)    s/p bypass graft x 4 (1991)  . Diverticulosis   . GERD (gastroesophageal reflux disease)   . Glaucoma   . Hypercholesterolemia   . Hypertension   . Pancreatitis    x2 (1994 and 1998), presumed to be gallstone pancreatitis.  s/p ERCP and sphincterotomy   Past Surgical History:  Procedure Laterality Date  . APPENDECTOMY    . CHOLECYSTECTOMY  1994  . CORONARY ARTERY BYPASS GRAFT     x 4,  1991  . INGUINAL HERNIA REPAIR  2009  . open heart surgery  1991   Family History  Problem Relation Age of Onset  . Throat cancer Father        oropharyngeal cancer  . Kidney disease Sister   . Heart disease Unknown   . Colon cancer Neg Hx   . Prostate cancer Neg Hx    Social History   Socioeconomic History  . Marital status: Married    Spouse name: Not on file  . Number of children: Not on file  . Years of education: Not on file  . Highest education level: Not on file  Occupational History  . Not on file  Social Needs  . Financial resource strain: Not on file  .  Food insecurity:    Worry: Not on file    Inability: Not on file  . Transportation needs:    Medical: Not on file    Non-medical: Not on file  Tobacco Use  . Smoking status: Former Smoker    Last attempt to quit: 01/14/1964    Years since quitting: 53.7  . Smokeless tobacco: Never Used  Substance and Sexual Activity  . Alcohol use: No    Alcohol/week: 0.0 standard drinks  . Drug use: No  . Sexual activity: Not on file  Lifestyle  . Physical activity:    Days per week: Not on file    Minutes per session: Not on file  . Stress: Not on file  Relationships  . Social connections:    Talks on phone: Not on file    Gets together: Not on file    Attends religious service: Not on file    Active member of club or organization: Not on file    Attends meetings of clubs or organizations: Not on file    Relationship status: Not on file  Other Topics Concern  . Not on file  Social  History Narrative  . Not on file    Outpatient Encounter Medications as of 09/08/2017  Medication Sig  . amLODipine (NORVASC) 5 MG tablet TAKE 1 TABLET (5 MG TOTAL) BY MOUTH DAILY.  Marland Kitchen Ascorbic Acid (VITAMIN C PO) Take 1 tablet by mouth daily. Reported on 06/20/2015  . aspirin EC 81 MG tablet Take 81 mg by mouth daily.  Marland Kitchen atorvastatin (LIPITOR) 20 MG tablet TAKE 1 TABLET (20 MG TOTAL) BY MOUTH DAILY.  . carbidopa-levodopa (SINEMET IR) 25-100 MG tablet TAKE 2 TABLETS BY MOUTH 3 TIMES DAILY  . citalopram (CELEXA) 20 MG tablet TAKE 1 TABLET (20 MG TOTAL) BY MOUTH DAILY.  . fluticasone (FLONASE) 50 MCG/ACT nasal spray SPRAY 2 SPRAYS INTO EACH NOSTRIL EVERY DAY  . furosemide (LASIX) 20 MG tablet Take 20 mg by mouth daily as needed.  . latanoprost (XALATAN) 0.005 % ophthalmic solution Place 1 drop into both eyes at bedtime.   Marland Kitchen MAGNESIUM PO Take 1 tablet by mouth daily. Reported on 06/20/2015  . metoprolol tartrate (LOPRESSOR) 25 MG tablet TAKE 1 TABLET BY MOUTH TWICE A DAY  . Multiple Vitamin (MULTIVITAMIN WITH  MINERALS) TABS tablet Take 1 tablet by mouth daily.  . pantoprazole (PROTONIX) 40 MG tablet TAKE 1 TABLET BY MOUTH DAILY  . tamsulosin (FLOMAX) 0.4 MG CAPS capsule TAKE 1 CAPSULE (0.4 MG TOTAL) BY MOUTH DAILY.   No facility-administered encounter medications on file as of 09/08/2017.     Review of Systems  Constitutional: Negative for appetite change and unexpected weight change.  HENT: Positive for congestion and postnasal drip. Negative for sinus pressure.   Respiratory: Negative for cough, chest tightness and shortness of breath.   Cardiovascular: Negative for chest pain, palpitations and leg swelling.  Gastrointestinal: Negative for abdominal pain, diarrhea, nausea and vomiting.  Genitourinary: Negative for difficulty urinating and dysuria.  Musculoskeletal: Negative for joint swelling and myalgias.  Skin: Negative for color change and rash.  Neurological: Negative for light-headedness and headaches.       Unsteadiness as outlined.    Psychiatric/Behavioral: Negative for agitation and dysphoric mood.       Objective:     Blood pressure rechecked by me:  130/64  Physical Exam  Constitutional: He appears well-developed and well-nourished. No distress.  HENT:  Nose: Nose normal.  Mouth/Throat: Oropharynx is clear and moist.  Neck: Neck supple.  Cardiovascular: Normal rate and regular rhythm.  Pulmonary/Chest: Effort normal and breath sounds normal. No respiratory distress.  Abdominal: Soft. Bowel sounds are normal. There is no tenderness.  Musculoskeletal: He exhibits no edema or tenderness.  Lymphadenopathy:    He has no cervical adenopathy.  Skin: No rash noted. No erythema.  Psychiatric: He has a normal mood and affect. His behavior is normal.    BP 130/78 (BP Location: Left Arm, Patient Position: Sitting, Cuff Size: Normal)   Pulse 67   Temp 97.8 F (36.6 C) (Oral)   Resp 18   Wt 154 lb 9.6 oz (70.1 kg)   SpO2 97%   BMI 24.95 kg/m  Wt Readings from Last 3  Encounters:  09/08/17 154 lb 9.6 oz (70.1 kg)  05/07/17 155 lb 6.4 oz (70.5 kg)  04/01/17 153 lb 9.6 oz (69.7 kg)     Lab Results  Component Value Date   WBC 9.2 09/03/2017   HGB 13.2 09/03/2017   HCT 39.3 09/03/2017   PLT 209.0 09/03/2017   GLUCOSE 90 09/03/2017   CHOL 128 09/03/2017   TRIG 91.0 09/03/2017  HDL 53.40 09/03/2017   LDLCALC 56 09/03/2017   ALT 12 09/03/2017   AST 20 09/03/2017   NA 136 09/03/2017   K 4.2 09/03/2017   CL 102 09/03/2017   CREATININE 0.93 09/03/2017   BUN 25 (H) 09/03/2017   CO2 28 09/03/2017   TSH 1.50 02/03/2017   INR 1.20 06/14/2015   HGBA1C 5.8 09/03/2017    Dg Chest 2 View  Result Date: 07/06/2016 CLINICAL DATA:  Productive cough beginning yesterday. EXAM: CHEST  2 VIEW COMPARISON:  06/05/2015 FINDINGS: Sternotomy wires unchanged. Lungs are adequately inflated without focal airspace consolidation or effusion. Calcification along the left hemidiaphragm unchanged. Symmetric small round nodular densities over the lung bases likely the nipples. Stable borderline cardiomegaly. Calcified plaque over the thoracic aorta. Mild degenerate change of the spine. Flattening of the hemidiaphragms on the lateral film. IMPRESSION: No acute cardiopulmonary disease. Stable pleural calcification of the left hemidiaphragm. Symmetric small round nodular densities likely be nipples projected over the lung bases. Recommend follow-up PA chest radiograph with nipple markers for confirmation. Electronically Signed   By: Marin Olp M.D.   On: 07/06/2016 10:06       Assessment & Plan:   Problem List Items Addressed This Visit    Anemia    Follow cbc.  Has had GI w/up.        Atrial fibrillation ([place])    Off coumadin secondary to GI bleed.  Follow by cardiology.  Stable.        CAD (coronary artery disease)    Followed by cardiology.  Stable.       Dizziness    Complains of dizziness as outlined.  Appears to be unsteady.  Has had extensive w/up.  Discussed  probably multifactorial.  Has been to vestibular rehab.  Sees neurology.  Feel symptoms c/w progressive supranuclear palsy.  Follow.        Essential hypertension    Blood pressure under good control.  Continue same medication regimen.  Follow pressures.  Follow metabolic panel.        Relevant Orders   Basic metabolic panel   Hypercholesteremia    On lipitor.  Low cholesterol diet and exercise.  Follow lipid panel and liver function tests.        Relevant Orders   Hepatic function panel   Lipid panel   Hyperglycemia    Follow met b and a1c.        Relevant Orders   Hemoglobin A1c   Progressive supranuclear palsy ([place])    Followed by neurology.  On sinemet.        Thoracic aortic atherosclerosis ([facility])    On lipitor.         Other Visit Diagnoses    Nasal congestion    -  Primary   Flonase and saline nasal spray as directed.  Follow.         [name], MD

## 2017-09-13 ENCOUNTER — Encounter: Payer: Self-pay | Admitting: Internal Medicine

## 2017-09-13 DIAGNOSIS — R739 Hyperglycemia, unspecified: Secondary | ICD-10-CM | POA: Insufficient documentation

## 2017-09-13 NOTE — Assessment & Plan Note (Signed)
Blood pressure under good control.  Continue same medication regimen.  Follow pressures.  Follow metabolic panel.   

## 2017-09-13 NOTE — Assessment & Plan Note (Signed)
Complains of dizziness as outlined.  Appears to be unsteady.  Has had extensive w/up.  Discussed probably multifactorial.  Has been to vestibular rehab.  Sees neurology.  Feel symptoms c/w progressive supranuclear palsy.  Follow.

## 2017-09-13 NOTE — Assessment & Plan Note (Signed)
Follow cbc.  Has had GI w/up.

## 2017-09-13 NOTE — Assessment & Plan Note (Signed)
Follow met b and a1c.

## 2017-09-13 NOTE — Assessment & Plan Note (Signed)
On lipitor.  Low cholesterol diet and exercise.  Follow lipid panel and liver function tests.   

## 2017-09-13 NOTE — Assessment & Plan Note (Signed)
Off coumadin secondary to GI bleed.  Follow by cardiology.  Stable.

## 2017-09-13 NOTE — Assessment & Plan Note (Signed)
On lipitor

## 2017-09-13 NOTE — Assessment & Plan Note (Signed)
Followed by neurology.  On sinemet.   

## 2017-09-13 NOTE — Assessment & Plan Note (Signed)
Followed by cardiology. Stable.   

## 2017-09-28 DIAGNOSIS — E782 Mixed hyperlipidemia: Secondary | ICD-10-CM | POA: Diagnosis not present

## 2017-09-28 DIAGNOSIS — I2510 Atherosclerotic heart disease of native coronary artery without angina pectoris: Secondary | ICD-10-CM | POA: Diagnosis not present

## 2017-09-28 DIAGNOSIS — Z23 Encounter for immunization: Secondary | ICD-10-CM | POA: Diagnosis not present

## 2017-09-28 DIAGNOSIS — R002 Palpitations: Secondary | ICD-10-CM | POA: Diagnosis not present

## 2017-09-28 DIAGNOSIS — R251 Tremor, unspecified: Secondary | ICD-10-CM | POA: Diagnosis not present

## 2017-09-28 DIAGNOSIS — I480 Paroxysmal atrial fibrillation: Secondary | ICD-10-CM | POA: Diagnosis not present

## 2017-09-28 DIAGNOSIS — R0602 Shortness of breath: Secondary | ICD-10-CM | POA: Diagnosis not present

## 2017-10-06 DIAGNOSIS — M5136 Other intervertebral disc degeneration, lumbar region: Secondary | ICD-10-CM | POA: Diagnosis not present

## 2017-10-06 DIAGNOSIS — M48062 Spinal stenosis, lumbar region with neurogenic claudication: Secondary | ICD-10-CM | POA: Diagnosis not present

## 2017-10-06 DIAGNOSIS — M5416 Radiculopathy, lumbar region: Secondary | ICD-10-CM | POA: Diagnosis not present

## 2017-10-13 ENCOUNTER — Other Ambulatory Visit: Payer: Self-pay | Admitting: Internal Medicine

## 2017-10-14 ENCOUNTER — Other Ambulatory Visit: Payer: Self-pay | Admitting: Internal Medicine

## 2017-11-12 ENCOUNTER — Other Ambulatory Visit: Payer: Self-pay | Admitting: Internal Medicine

## 2017-11-20 DIAGNOSIS — G629 Polyneuropathy, unspecified: Secondary | ICD-10-CM | POA: Diagnosis not present

## 2017-11-20 DIAGNOSIS — G231 Progressive supranuclear ophthalmoplegia [Steele-Richardson-Olszewski]: Secondary | ICD-10-CM | POA: Diagnosis not present

## 2017-11-20 DIAGNOSIS — I951 Orthostatic hypotension: Secondary | ICD-10-CM | POA: Diagnosis not present

## 2017-11-23 DIAGNOSIS — M25532 Pain in left wrist: Secondary | ICD-10-CM | POA: Diagnosis not present

## 2017-11-23 DIAGNOSIS — W010XXA Fall on same level from slipping, tripping and stumbling without subsequent striking against object, initial encounter: Secondary | ICD-10-CM | POA: Diagnosis not present

## 2017-11-23 DIAGNOSIS — S63502A Unspecified sprain of left wrist, initial encounter: Secondary | ICD-10-CM | POA: Diagnosis not present

## 2017-12-16 DIAGNOSIS — S63502D Unspecified sprain of left wrist, subsequent encounter: Secondary | ICD-10-CM | POA: Diagnosis not present

## 2018-01-15 DIAGNOSIS — D485 Neoplasm of uncertain behavior of skin: Secondary | ICD-10-CM | POA: Diagnosis not present

## 2018-01-15 DIAGNOSIS — D171 Benign lipomatous neoplasm of skin and subcutaneous tissue of trunk: Secondary | ICD-10-CM | POA: Diagnosis not present

## 2018-01-15 DIAGNOSIS — Z85828 Personal history of other malignant neoplasm of skin: Secondary | ICD-10-CM | POA: Diagnosis not present

## 2018-01-15 DIAGNOSIS — Z08 Encounter for follow-up examination after completed treatment for malignant neoplasm: Secondary | ICD-10-CM | POA: Diagnosis not present

## 2018-01-15 DIAGNOSIS — L821 Other seborrheic keratosis: Secondary | ICD-10-CM | POA: Diagnosis not present

## 2018-01-15 DIAGNOSIS — D044 Carcinoma in situ of skin of scalp and neck: Secondary | ICD-10-CM | POA: Diagnosis not present

## 2018-01-25 ENCOUNTER — Other Ambulatory Visit (INDEPENDENT_AMBULATORY_CARE_PROVIDER_SITE_OTHER): Payer: Medicare HMO

## 2018-01-25 DIAGNOSIS — I10 Essential (primary) hypertension: Secondary | ICD-10-CM

## 2018-01-25 DIAGNOSIS — R739 Hyperglycemia, unspecified: Secondary | ICD-10-CM

## 2018-01-25 DIAGNOSIS — E7800 Pure hypercholesterolemia, unspecified: Secondary | ICD-10-CM | POA: Diagnosis not present

## 2018-01-25 LAB — BASIC METABOLIC PANEL
BUN: 14 mg/dL (ref 6–23)
CO2: 28 mEq/L (ref 19–32)
Calcium: 9.5 mg/dL (ref 8.4–10.5)
Chloride: 101 mEq/L (ref 96–112)
Creatinine, Ser: 0.91 mg/dL (ref 0.40–1.50)
GFR: 83.19 mL/min (ref >60.00)
GLUCOSE: 98 mg/dL (ref 70–99)
POTASSIUM: 4.6 meq/L (ref 3.5–5.1)
SODIUM: 135 meq/L (ref 135–145)

## 2018-01-25 LAB — LIPID PANEL
CHOLESTEROL: 123 mg/dL (ref 0–200)
HDL: 49.3 mg/dL (ref >39.00)
LDL Cholesterol: 50 mg/dL (ref 0–99)
NONHDL: 73.97
TRIGLYCERIDES: 118 mg/dL (ref 0.0–149.0)
Total CHOL/HDL Ratio: 3
VLDL: 23.6 mg/dL (ref 0.0–40.0)

## 2018-01-25 LAB — HEPATIC FUNCTION PANEL
ALBUMIN: 4.4 g/dL (ref 3.5–5.2)
ALT: 12 U/L (ref 0–53)
AST: 21 U/L (ref 0–37)
Alkaline Phosphatase: 79 U/L (ref 39–117)
Bilirubin, Direct: 0.2 mg/dL (ref 0.0–0.3)
TOTAL PROTEIN: 7 g/dL (ref 6.0–8.3)
Total Bilirubin: 0.7 mg/dL (ref 0.2–1.2)

## 2018-01-25 LAB — HEMOGLOBIN A1C: HEMOGLOBIN A1C: 5.7 % (ref 4.6–6.5)

## 2018-01-27 ENCOUNTER — Ambulatory Visit (INDEPENDENT_AMBULATORY_CARE_PROVIDER_SITE_OTHER): Payer: Medicare HMO | Admitting: Internal Medicine

## 2018-01-27 ENCOUNTER — Encounter: Payer: Self-pay | Admitting: Internal Medicine

## 2018-01-27 ENCOUNTER — Ambulatory Visit (INDEPENDENT_AMBULATORY_CARE_PROVIDER_SITE_OTHER): Payer: Medicare HMO

## 2018-01-27 VITALS — BP 130/62 | HR 74 | Temp 97.3°F | Resp 18 | Ht 66.0 in | Wt 158.0 lb

## 2018-01-27 DIAGNOSIS — I10 Essential (primary) hypertension: Secondary | ICD-10-CM

## 2018-01-27 DIAGNOSIS — R42 Dizziness and giddiness: Secondary | ICD-10-CM

## 2018-01-27 DIAGNOSIS — I2510 Atherosclerotic heart disease of native coronary artery without angina pectoris: Secondary | ICD-10-CM

## 2018-01-27 DIAGNOSIS — I4891 Unspecified atrial fibrillation: Secondary | ICD-10-CM | POA: Diagnosis not present

## 2018-01-27 DIAGNOSIS — R0602 Shortness of breath: Secondary | ICD-10-CM

## 2018-01-27 DIAGNOSIS — I700 Atherosclerosis of aorta: Secondary | ICD-10-CM

## 2018-01-27 DIAGNOSIS — D508 Other iron deficiency anemias: Secondary | ICD-10-CM | POA: Diagnosis not present

## 2018-01-27 DIAGNOSIS — R0989 Other specified symptoms and signs involving the circulatory and respiratory systems: Secondary | ICD-10-CM | POA: Diagnosis not present

## 2018-01-27 DIAGNOSIS — G231 Progressive supranuclear ophthalmoplegia [Steele-Richardson-Olszewski]: Secondary | ICD-10-CM

## 2018-01-27 DIAGNOSIS — E7800 Pure hypercholesterolemia, unspecified: Secondary | ICD-10-CM | POA: Diagnosis not present

## 2018-01-27 DIAGNOSIS — R9389 Abnormal findings on diagnostic imaging of other specified body structures: Secondary | ICD-10-CM

## 2018-01-27 DIAGNOSIS — R739 Hyperglycemia, unspecified: Secondary | ICD-10-CM

## 2018-01-27 MED ORDER — AZELASTINE HCL 0.1 % NA SOLN: 1.0000 | Freq: Two times a day (BID) | NASAL | 1 refills | Status: DC

## 2018-01-27 NOTE — Progress Notes (Signed)
[name]: Tyler Stout, male   DOB: 23-Jun-1925, 85 y.o.   MRN: [id number]   Subjective:    [name]: Tyler Stout, male    DOB: October 11, 1925, 85 y.o.   MRN: [id number]  HPI   Patient here for a scheduled physical.  He is accompanied by his wife.  History obtained from both of them.  Has a history of progressive supranuclear palsy.  Sees neurology.  Just evaluated 11/2017.  No changes made.  Recommended f/u in 6 months.  Has been seen by ortho for left wrist pain/sprain.  Reports increased sinus pressure and some nasal congestion.  Throat congestion.  Some sob with increased activity.  Stable breathing, but persistent issues.  No chest pain.  No acid reflux.  No abdominal pain.  Bowels moving.  Still some intermittent dizziness.  Worse with position changes.  Discussed slow position changes and movements.  Also discussed using support with ambulation.  Blood pressures averaging 120-140/50-60s.     Past Medical History:  Diagnosis Date  . Anemia    iron deficiency  . Arthritis   . Atrial fibrillation ([place])   . Bilateral renal cysts    worked up by Dr Bernardo Heater  . BPH (benign prostatic hypertrophy)   . CAD (coronary artery disease)    s/p bypass graft x 4 (1991)  . Diverticulosis   . GERD (gastroesophageal reflux disease)   . Glaucoma   . Hypercholesterolemia   . Hypertension   . Pancreatitis    x2 (1994 and 1998), presumed to be gallstone pancreatitis.  s/p ERCP and sphincterotomy   Past Surgical History:  Procedure Laterality Date  . APPENDECTOMY    . CHOLECYSTECTOMY  1994  . CORONARY ARTERY BYPASS GRAFT     x 4,  1991  . INGUINAL HERNIA REPAIR  2009  . open heart surgery  1991   Family History  Problem Relation Age of Onset  . Throat cancer Father        oropharyngeal cancer  . Kidney disease Sister   . Heart disease Other   . Colon cancer Neg Hx   . Prostate cancer Neg Hx    Social History   Socioeconomic History  . Marital status: Married    Spouse name: Not  on file  . Number of children: Not on file  . Years of education: Not on file  . Highest education level: Not on file  Occupational History  . Not on file  Social Needs  . Financial resource strain: Not on file  . Food insecurity:    Worry: Not on file    Inability: Not on file  . Transportation needs:    Medical: Not on file    Non-medical: Not on file  Tobacco Use  . Smoking status: Former Smoker    Last attempt to quit: 01/14/1964    Years since quitting: 54.0  . Smokeless tobacco: Never Used  Substance and Sexual Activity  . Alcohol use: No    Alcohol/week: 0.0 standard drinks  . Drug use: No  . Sexual activity: Not on file  Lifestyle  . Physical activity:    Days per week: Not on file    Minutes per session: Not on file  . Stress: Not on file  Relationships  . Social connections:    Talks on phone: Not on file    Gets together: Not on file    Attends religious service: Not on file    Active member of club or organization:  Not on file    Attends meetings of clubs or organizations: Not on file    Relationship status: Not on file  Other Topics Concern  . Not on file  Social History Narrative  . Not on file    Outpatient Encounter Medications as of 01/27/2018  Medication Sig  . citalopram (CELEXA) 20 MG tablet Take 0.5 tablets by mouth daily.  Marland Kitchen amLODipine (NORVASC) 5 MG tablet TAKE 1 TABLET (5 MG TOTAL) BY MOUTH DAILY.  Marland Kitchen Ascorbic Acid (VITAMIN C PO) Take 1 tablet by mouth daily. Reported on 06/20/2015  . aspirin EC 81 MG tablet Take 81 mg by mouth daily.  Marland Kitchen atorvastatin (LIPITOR) 20 MG tablet TAKE 1 TABLET (20 MG TOTAL) BY MOUTH DAILY.  Marland Kitchen azelastine (ASTELIN) 0.1 % nasal spray Place 1 spray into both nostrils 2 (two) times daily. Use in each nostril as directed  . carbidopa-levodopa (SINEMET IR) 25-100 MG tablet TAKE 2 TABLETS BY MOUTH 3 TIMES DAILY  . fluticasone (FLONASE) 50 MCG/ACT nasal spray SPRAY 2 SPRAYS INTO EACH NOSTRIL EVERY DAY  . furosemide (LASIX) 20 MG  tablet Take 20 mg by mouth daily as needed.  . latanoprost (XALATAN) 0.005 % ophthalmic solution Place 1 drop into both eyes at bedtime.   Marland Kitchen MAGNESIUM PO Take 1 tablet by mouth daily. Reported on 06/20/2015  . metoprolol tartrate (LOPRESSOR) 25 MG tablet TAKE 1 TABLET BY MOUTH TWICE A DAY  . Multiple Vitamin (MULTIVITAMIN WITH MINERALS) TABS tablet Take 1 tablet by mouth daily.  . pantoprazole (PROTONIX) 40 MG tablet TAKE 1 TABLET BY MOUTH DAILY  . tamsulosin (FLOMAX) 0.4 MG CAPS capsule TAKE 1 CAPSULE (0.4 MG TOTAL) BY MOUTH DAILY.  . [DISCONTINUED] citalopram (CELEXA) 20 MG tablet TAKE 1 TABLET (20 MG TOTAL) BY MOUTH DAILY.   No facility-administered encounter medications on file as of 01/27/2018.     Review of Systems  Constitutional: Negative for appetite change and unexpected weight change.  HENT: Positive for congestion. Negative for sinus pressure.   Eyes: Negative for pain and visual disturbance.  Respiratory: Positive for shortness of breath. Negative for cough and chest tightness.   Cardiovascular: Negative for chest pain, palpitations and leg swelling.  Gastrointestinal: Negative for abdominal pain, diarrhea, nausea and vomiting.  Genitourinary: Negative for difficulty urinating and dysuria.  Musculoskeletal: Negative for joint swelling and myalgias.  Skin: Negative for color change and rash.  Neurological: Positive for dizziness. Negative for headaches.  Hematological: Negative for adenopathy. Does not bruise/bleed easily.  Psychiatric/Behavioral: Negative for agitation and dysphoric mood.       Objective:    Physical Exam Constitutional:      General: He is not in acute distress.    Appearance: Normal appearance. He is well-developed.  HENT:     Head: Normocephalic and atraumatic.     Nose: Nose normal. No congestion.     Mouth/Throat:     Pharynx: No oropharyngeal exudate or posterior oropharyngeal erythema.  Eyes:     General:        Right eye: No discharge.         Left eye: No discharge.     Conjunctiva/sclera: Conjunctivae normal.  Neck:     Musculoskeletal: Neck supple. No muscular tenderness.     Thyroid: No thyromegaly.  Cardiovascular:     Rate and Rhythm: Normal rate and regular rhythm.  Pulmonary:     Comments: No wheezes.  Question of dry crackles - base.   Abdominal:     General:  Bowel sounds are normal.     Palpations: Abdomen is soft.     Tenderness: There is no abdominal tenderness.  Genitourinary:    Comments: Not performed.   Musculoskeletal:        General: No swelling or tenderness.  Lymphadenopathy:     Cervical: No cervical adenopathy.  Skin:    Findings: No erythema or rash.  Neurological:     Mental Status: He is alert and oriented to person, place, and time.  Psychiatric:        Mood and Affect: Mood normal.        Behavior: Behavior normal.     BP 130/62 (BP Location: Left Arm, Patient Position: Sitting, Cuff Size: Normal)   Pulse 74   Temp (!) 97.3 F (36.3 C) (Oral)   Resp 18   Ht '5\' 6"'  (1.676 m)   Wt 158 lb (71.7 kg)   SpO2 98%   BMI 25.50 kg/m  Wt Readings from Last 3 Encounters:  01/27/18 158 lb (71.7 kg)  09/08/17 154 lb 9.6 oz (70.1 kg)  05/07/17 155 lb 6.4 oz (70.5 kg)     Lab Results  Component Value Date   WBC 9.2 09/03/2017   HGB 13.2 09/03/2017   HCT 39.3 09/03/2017   PLT 209.0 09/03/2017   GLUCOSE 98 01/25/2018   CHOL 123 01/25/2018   TRIG 118.0 01/25/2018   HDL 49.30 01/25/2018   LDLCALC 50 01/25/2018   ALT 12 01/25/2018   AST 21 01/25/2018   NA 135 01/25/2018   K 4.6 01/25/2018   CL 101 01/25/2018   CREATININE 0.91 01/25/2018   BUN 14 01/25/2018   CO2 28 01/25/2018   TSH 1.50 02/03/2017   INR 1.20 06/14/2015   HGBA1C 5.7 01/25/2018    Dg Chest 2 View  Result Date: 07/06/2016 CLINICAL DATA:  Productive cough beginning yesterday. EXAM: CHEST  2 VIEW COMPARISON:  06/05/2015 FINDINGS: Sternotomy wires unchanged. Lungs are adequately inflated without focal airspace  consolidation or effusion. Calcification along the left hemidiaphragm unchanged. Symmetric small round nodular densities over the lung bases likely the nipples. Stable borderline cardiomegaly. Calcified plaque over the thoracic aorta. Mild degenerate change of the spine. Flattening of the hemidiaphragms on the lateral film. IMPRESSION: No acute cardiopulmonary disease. Stable pleural calcification of the left hemidiaphragm. Symmetric small round nodular densities likely be nipples projected over the lung bases. Recommend follow-up PA chest radiograph with nipple markers for confirmation. Electronically Signed   By: Marin Olp M.D.   On: 07/06/2016 10:06       Assessment & Plan:   Problem List Items Addressed This Visit    Abnormal CXR    Previous cxr as outlined.  With some persistent congestion and sob, recheck cxr today.  Discussed referral to pulmonary.  mucinex and nasal sprays as directed.         Anemia    Has had GI w/up.  Follow cbc.       Atrial fibrillation ([place])    Off coumadin secondary to GI bleed.  Followed by cardiology.  Stable.        CAD (coronary artery disease)    Followed by cardiology stable.        Dizziness    Describes some dizziness and unsteadiness as outlined.  Has had extensive w/up.  Discussed again - multifactorial.  Has progressive supranuclear palsy.  Some orthostatics.  Compression hose.  Use support.  Slow position changes.  Continue f/u with neurology.  Essential hypertension    Blood pressure under has been under reasonable.control.  Continue same medication regimen.  Follow pressures.  Follow metabolic panel.        Relevant Orders   CBC with Differential/Platelet   TSH   Hypercholesteremia    On lipitor.  Low cholesterol diet and exercise.  Follow lipid panel and liver function tests.        Relevant Orders   Hepatic function panel   Lipid panel   Hyperglycemia    Low carb diet and exercise.  Follow met b and a1c.         Relevant Orders   Hemoglobin A1c   Progressive supranuclear palsy ([place])    Followed by neurology.  Just evaluated in 11/2017.  Recommended 6 month f/u.        Thoracic aortic atherosclerosis ([facility])    On lipitor.         Other Visit Diagnoses    SOB (shortness of breath)    -  Primary   Relevant Orders   DG Chest 2 View (Completed)       [name], MD

## 2018-01-28 ENCOUNTER — Other Ambulatory Visit: Payer: Self-pay | Admitting: Internal Medicine

## 2018-01-28 DIAGNOSIS — R9389 Abnormal findings on diagnostic imaging of other specified body structures: Secondary | ICD-10-CM

## 2018-01-28 DIAGNOSIS — R0602 Shortness of breath: Secondary | ICD-10-CM

## 2018-01-28 NOTE — Progress Notes (Signed)
Order placed for pulmonary referral.  

## 2018-01-31 ENCOUNTER — Encounter: Payer: Self-pay | Admitting: Internal Medicine

## 2018-01-31 NOTE — Assessment & Plan Note (Signed)
On lipitor

## 2018-01-31 NOTE — Assessment & Plan Note (Signed)
Off coumadin secondary to GI bleed.  Followed by cardiology.  Stable.

## 2018-01-31 NOTE — Assessment & Plan Note (Signed)
Blood pressure under has been under reasonable.control.  Continue same medication regimen.  Follow pressures.  Follow metabolic panel.

## 2018-01-31 NOTE — Assessment & Plan Note (Signed)
Low carb diet and exercise.  Follow met b and a1c.   

## 2018-01-31 NOTE — Assessment & Plan Note (Signed)
Followed by cardiology °stable °

## 2018-01-31 NOTE — Assessment & Plan Note (Signed)
Previous cxr as outlined.  With some persistent congestion and sob, recheck cxr today.  Discussed referral to pulmonary.  mucinex and nasal sprays as directed.

## 2018-01-31 NOTE — Assessment & Plan Note (Signed)
Followed by neurology.  Just evaluated in 11/2017.  Recommended 6 month f/u.

## 2018-01-31 NOTE — Assessment & Plan Note (Signed)
Describes some dizziness and unsteadiness as outlined.  Has had extensive w/up.  Discussed again - multifactorial.  Has progressive supranuclear palsy.  Some orthostatics.  Compression hose.  Use support.  Slow position changes.  Continue f/u with neurology.

## 2018-01-31 NOTE — Assessment & Plan Note (Signed)
Has had GI w/up.  Follow cbc.  

## 2018-01-31 NOTE — Assessment & Plan Note (Signed)
On lipitor.  Low cholesterol diet and exercise.  Follow lipid panel and liver function tests.   

## 2018-02-08 ENCOUNTER — Institutional Professional Consult (permissible substitution): Payer: Medicare HMO | Admitting: Internal Medicine

## 2018-02-08 ENCOUNTER — Ambulatory Visit (INDEPENDENT_AMBULATORY_CARE_PROVIDER_SITE_OTHER): Payer: Medicare HMO | Admitting: Internal Medicine

## 2018-02-08 ENCOUNTER — Encounter: Payer: Self-pay | Admitting: Internal Medicine

## 2018-02-08 VITALS — BP 119/62 | HR 56 | Ht 66.0 in | Wt 158.8 lb

## 2018-02-08 DIAGNOSIS — J411 Mucopurulent chronic bronchitis: Secondary | ICD-10-CM | POA: Diagnosis not present

## 2018-02-08 MED ORDER — FLUTICASONE FUROATE-VILANTEROL 200-25 MCG/INH IN AEPB: 1.0000 | INHALATION_SPRAY | Freq: Every day | RESPIRATORY_TRACT | 0 refills | Status: DC

## 2018-02-08 MED ORDER — FLUTICASONE-UMECLIDIN-VILANT 100-62.5-25 MCG/INH IN AEPB: 1.0000 | INHALATION_SPRAY | Freq: Every day | RESPIRATORY_TRACT | 0 refills | Status: DC

## 2018-02-08 MED ORDER — FLUTICASONE FUROATE-VILANTEROL 200-25 MCG/INH IN AEPB: 1.0000 | INHALATION_SPRAY | Freq: Every day | RESPIRATORY_TRACT | 2 refills | Status: DC

## 2018-02-08 MED ORDER — FLUTICASONE FUROATE-VILANTEROL 200-25 MCG/INH IN AEPB: 1.0000 | INHALATION_SPRAY | Freq: Every day | RESPIRATORY_TRACT | 2 refills | Status: AC

## 2018-02-08 NOTE — Progress Notes (Signed)
[facility] Consultation      Assessment and Plan:  Chronic bronchitis with excess mucus production. - Patient notes that he has congestion in his lungs, and sometimes has trouble coughing it up.  This is combined with mild dyspnea on exertion.  Findings on chest x-ray are consistent with chronic bronchitis, there is mild hyperinflation suggestive of emphysema. - We will start the patient on Breo inhaler once daily, rinse mouth after use.  If not helpful we could consider starting on Mucinex, though with his advanced age should be used with caution.  Chronic rhinitis with chronic cough. - Patient's chronic cough improved with treatment with Astelin. - Continue Astelin, continue Flonase.  Asked to use them both regularly.  Meds ordered this encounter  Medications  . DISCONTD: fluticasone furoate-vilanterol (BREO ELLIPTA) 200-25 MCG/INH AEPB    Sig: Inhale 1 puff into the lungs daily.    Dispense:  30 each    Refill:  2  . DISCONTD: Fluticasone-Umeclidin-Vilant (TRELEGY ELLIPTA) 100-62.5-25 MCG/INH AEPB    Sig: Inhale 1 puff into the lungs daily.    Dispense:  1 each    Refill:  0    Order Specific Question:   Lot Number?    Answer:   [id number]    Order Specific Question:   Expiration Date?    Answer:   02/09/2019    Order Specific Question:   Manufacturer?    Answer:   [employer] [[id number]]    Order Specific Question:   Quantity    Answer:   1  . fluticasone furoate-vilanterol (BREO ELLIPTA) 200-25 MCG/INH AEPB    Sig: Inhale 1 puff into the lungs daily.    Dispense:  30 each    Refill:  2  . fluticasone furoate-vilanterol (BREO ELLIPTA) 200-25 MCG/INH AEPB    Sig: Inhale 1 puff into the lungs daily.    Dispense:  1 each    Refill:  0    Order Specific Question:   Lot Number?    Answer:   [id number]    Order Specific Question:   Expiration Date?    Answer:   02/09/2019    Order Specific Question:   Manufacturer?    Answer:   [employer] [[id number]]    Order Specific  Question:   Quantity    Answer:   1   Return in about 6 months (around 08/09/2018).     Date: 02/08/2018  MRN# [id number] Tyler Stout 11/29/1925   Tyler Stout is a 85 y.o. old male seen in consultation for chief complaint of:    Chief Complaint  Patient presents with  . Consult    referred by Dr. Nicki Reaper  . Shortness of Breath    lasted about a year now, with sitting and exertion   . Chest Pain    gets tight when phlem starts gathering in chest   . Cough    at night, clear difficult to bring up    HPI:   The patient is a 85 year old male with history of progressive supranuclear palsy. He follows with cardiology he echocardiogram which revealed ejection fraction of 45-50% with mild AI MR and TR with mild to moderate pulmonary hypertension. He is here today because he has been coughing, and not able to cough anything up. This has been going on for about a year and has been progressing.  He has been using astelin which has helped and he is not coughing at night.  He sings at church and sometimes  gets out of breath.  He is not on current inhaler.  Denies hemoptysis. He last smoked in 1966. He smoked for about 20-25 years or so.  He uses what sounds like an incentive spirometer, he denies use of any current inhalers other than Astelin and Flonase.  **Chest x-ray 01/27/2018>> mild hyperinflation, mild changes of chronic bronchitis.  Lungs are otherwise unremarkable. **CBC 05/05/2017>> absolute eosinophil count 200. **Echocardiogram 07/28/9676>> EF 93%, Systolic pressure was mildly increased. PA   peak pressure: 42 mm Hg (S).   PMHX:   Past Medical History:  Diagnosis Date  . Anemia    iron deficiency  . Arthritis   . Atrial fibrillation ([place])   . Bilateral renal cysts    worked up by Dr Bernardo Heater  . BPH (benign prostatic hypertrophy)   . CAD (coronary artery disease)    s/p bypass graft x 4 (1991)  . Diverticulosis   . GERD (gastroesophageal reflux disease)   .  Glaucoma   . Hypercholesterolemia   . Hypertension   . Pancreatitis    x2 (1994 and 1998), presumed to be gallstone pancreatitis.  s/p ERCP and sphincterotomy   Surgical Hx:  Past Surgical History:  Procedure Laterality Date  . APPENDECTOMY    . CHOLECYSTECTOMY  1994  . CORONARY ARTERY BYPASS GRAFT     x 4,  1991  . INGUINAL HERNIA REPAIR  2009  . open heart surgery  1991   Family Hx:  Family History  Problem Relation Age of Onset  . Throat cancer Father        oropharyngeal cancer  . Kidney disease Sister   . Heart disease Other   . Colon cancer Neg Hx   . Prostate cancer Neg Hx    Social Hx:   Social History   Tobacco Use  . Smoking status: Former Smoker    Packs/day: 2.00    Years: 25.00    Pack years: 50.00    Types: Cigarettes    Last attempt to quit: 01/14/1964    Years since quitting: 54.1  . Smokeless tobacco: Never Used  . Tobacco comment: smoked 2 packs/day, 1 pipe and 1 cigar once in a while.  Substance Use Topics  . Alcohol use: No    Alcohol/week: 0.0 standard drinks  . Drug use: No   Medication:    Current Outpatient Medications:  .  amLODipine (NORVASC) 5 MG tablet, TAKE 1 TABLET (5 MG TOTAL) BY MOUTH DAILY., Disp: 90 tablet, Rfl: 1 .  Ascorbic Acid (VITAMIN C PO), Take 1 tablet by mouth daily. Reported on 06/20/2015, Disp: , Rfl:  .  aspirin EC 81 MG tablet, Take 81 mg by mouth daily., Disp: , Rfl:  .  atorvastatin (LIPITOR) 20 MG tablet, TAKE 1 TABLET (20 MG TOTAL) BY MOUTH DAILY., Disp: 90 tablet, Rfl: 3 .  azelastine (ASTELIN) 0.1 % nasal spray, Place 1 spray into both nostrils 2 (two) times daily. Use in each nostril as directed, Disp: 30 mL, Rfl: 1 .  carbidopa-levodopa (SINEMET IR) 25-100 MG tablet, TAKE 2 TABLETS BY MOUTH 3 TIMES DAILY, Disp: , Rfl: 2 .  citalopram (CELEXA) 20 MG tablet, Take 0.5 tablets by mouth daily., Disp: , Rfl:  .  fluticasone (FLONASE) 50 MCG/ACT nasal spray, SPRAY 2 SPRAYS INTO EACH NOSTRIL EVERY DAY, Disp: 16 g, Rfl:  1 .  furosemide (LASIX) 20 MG tablet, Take 20 mg by mouth daily as needed., Disp: , Rfl:  .  latanoprost (XALATAN) 0.005 % ophthalmic solution,  Place 1 drop into both eyes at bedtime. , Disp: , Rfl:  .  MAGNESIUM PO, Take 1 tablet by mouth daily. Reported on 06/20/2015, Disp: , Rfl:  .  metoprolol tartrate (LOPRESSOR) 25 MG tablet, TAKE 1 TABLET BY MOUTH TWICE A DAY, Disp: , Rfl:  .  Multiple Vitamin (MULTIVITAMIN WITH MINERALS) TABS tablet, Take 1 tablet by mouth daily., Disp: , Rfl:  .  pantoprazole (PROTONIX) 40 MG tablet, TAKE 1 TABLET BY MOUTH DAILY, Disp: 90 tablet, Rfl: 3 .  tamsulosin (FLOMAX) 0.4 MG CAPS capsule, TAKE 1 CAPSULE (0.4 MG TOTAL) BY MOUTH DAILY., Disp: 90 capsule, Rfl: 1   Allergies:  Ilosone [erythromycin] and Penicillins  Review of Systems: Gen:  Denies  fever, sweats, chills HEENT: Denies blurred vision, double vision. bleeds, sore throat Cvc:  No dizziness, chest pain. Resp:   Denies cough or sputum production, shortness of breath Gi: Denies swallowing difficulty, stomach pain. Gu:  Denies bladder incontinence, burning urine Ext:   No Joint pain, stiffness. Skin: No skin rash,  hives  Endoc:  No polyuria, polydipsia. Psych: No depression, insomnia. Other:  All other systems were reviewed with the patient and were negative other that what is mentioned in the HPI.   Physical Examination:   VS: BP 119/62 (BP Location: Left Arm, Cuff Size: Normal)   Pulse (!) 56   Ht 5\' 6"  (1.676 m)   Wt 158 lb 12.8 oz (72 kg)   SpO2 98%   BMI 25.63 kg/m   General Appearance: No distress  Neuro:without focal findings,  speech normal,  HEENT: PERRLA, EOM intact.   Pulmonary: normal breath sounds, No wheezing.  CardiovascularNormal S1,S2.  No m/r/g.   Abdomen: Benign, Soft, non-tender. Renal:  No costovertebral tenderness  GU:  No performed at this time. Endoc: No evident thyromegaly, no signs of acromegaly. Skin:   warm, no rashes, no ecchymosis  Extremities: normal, no  cyanosis, clubbing.  Other findings:    LABORATORY PANEL:   CBC No results for input(s): WBC, HGB, HCT, PLT in the last 168 hours. ------------------------------------------------------------------------------------------------------------------  Chemistries  No results for input(s): NA, K, CL, CO2, GLUCOSE, BUN, CREATININE, CALCIUM, MG, AST, ALT, ALKPHOS, BILITOT in the last 168 hours.  Invalid input(s): GFRCGP ------------------------------------------------------------------------------------------------------------------  Cardiac Enzymes No results for input(s): TROPONINI in the last 168 hours. ------------------------------------------------------------  RADIOLOGY:  No results found.     Thank  you for the consultation and for allowing [facility] to assist in the care of your patient. Our recommendations are noted above.  Please contact us if we can be of further service.   Marda Stalker, M.D., F.C.C.P.  Board Certified in Internal Medicine, Pulmonary Medicine, [place], and Sleep Medicine.  [facility] Office Number: [phone]   02/08/2018

## 2018-02-08 NOTE — Patient Instructions (Addendum)
Continue to use astelin and flonase.   Start the [facility] inhaler once daily, we prescribed it for you also. If it is too expensive call us and we can find an alternative. Rinse mouth after use.

## 2018-02-18 ENCOUNTER — Other Ambulatory Visit: Payer: Self-pay | Admitting: Internal Medicine

## 2018-03-01 DIAGNOSIS — H401132 Primary open-angle glaucoma, bilateral, moderate stage: Secondary | ICD-10-CM | POA: Diagnosis not present

## 2018-03-17 DIAGNOSIS — D044 Carcinoma in situ of skin of scalp and neck: Secondary | ICD-10-CM | POA: Diagnosis not present

## 2018-03-17 DIAGNOSIS — X32XXXA Exposure to sunlight, initial encounter: Secondary | ICD-10-CM | POA: Diagnosis not present

## 2018-03-17 DIAGNOSIS — L570 Actinic keratosis: Secondary | ICD-10-CM | POA: Diagnosis not present

## 2018-03-30 DIAGNOSIS — K31819 Angiodysplasia of stomach and duodenum without bleeding: Secondary | ICD-10-CM | POA: Diagnosis not present

## 2018-03-30 DIAGNOSIS — I2510 Atherosclerotic heart disease of native coronary artery without angina pectoris: Secondary | ICD-10-CM | POA: Diagnosis not present

## 2018-03-30 DIAGNOSIS — I951 Orthostatic hypotension: Secondary | ICD-10-CM | POA: Diagnosis not present

## 2018-03-30 DIAGNOSIS — E782 Mixed hyperlipidemia: Secondary | ICD-10-CM | POA: Diagnosis not present

## 2018-03-30 DIAGNOSIS — E7800 Pure hypercholesterolemia, unspecified: Secondary | ICD-10-CM | POA: Diagnosis not present

## 2018-03-30 DIAGNOSIS — R251 Tremor, unspecified: Secondary | ICD-10-CM | POA: Diagnosis not present

## 2018-03-30 DIAGNOSIS — R0602 Shortness of breath: Secondary | ICD-10-CM | POA: Diagnosis not present

## 2018-03-30 DIAGNOSIS — R002 Palpitations: Secondary | ICD-10-CM | POA: Diagnosis not present

## 2018-03-30 DIAGNOSIS — I480 Paroxysmal atrial fibrillation: Secondary | ICD-10-CM | POA: Diagnosis not present

## 2018-04-10 ENCOUNTER — Other Ambulatory Visit: Payer: Self-pay | Admitting: Internal Medicine

## 2018-04-30 ENCOUNTER — Telehealth: Payer: Self-pay | Admitting: Internal Medicine

## 2018-04-30 NOTE — Telephone Encounter (Signed)
Patients daughter called and stated that her dad is having a lot of congestion and drainage. They are wondering what the patient can take that will not cause a reaction to his other mediations. Please advise [phone] (mobile)

## 2018-05-03 ENCOUNTER — Other Ambulatory Visit: Payer: Self-pay

## 2018-05-03 ENCOUNTER — Ambulatory Visit (INDEPENDENT_AMBULATORY_CARE_PROVIDER_SITE_OTHER): Payer: Medicare HMO | Admitting: Internal Medicine

## 2018-05-03 ENCOUNTER — Encounter: Payer: Self-pay | Admitting: Internal Medicine

## 2018-05-03 DIAGNOSIS — I4891 Unspecified atrial fibrillation: Secondary | ICD-10-CM

## 2018-05-03 DIAGNOSIS — R059 Cough, unspecified: Secondary | ICD-10-CM

## 2018-05-03 DIAGNOSIS — R05 Cough: Secondary | ICD-10-CM

## 2018-05-03 NOTE — Telephone Encounter (Signed)
Pt scheduled for today at 106

## 2018-05-03 NOTE — Progress Notes (Addendum)
[name]: Tyler Stout, male   DOB: 1925-09-27, 85 y.o.   MRN: [id number] Virtual Visit via Telephone: Note  This visit type was conducted due to national recommendations for restrictions regarding the COVID-19 pandemic (e.g. social distancing).  This format is felt to be most appropriate for this patient at this time.  All issues noted in this document were discussed and addressed.  No physical exam was performed (except for noted visual exam findings with Video Visits).   I connected with [name] on 05/03/18 at 12:00 PM EDT by telephone and verified that I am speaking with the correct person using two identifiers. Location patient: home Location provider: work or home office Persons participating in the telephone visit: patient, provider and pts wife [name] and daughter Olivia Mackie.   I discussed the limitations, risks, security and privacy concerns of performing an evaluation and management service by telephone and the availability of in person appointments.The patient expressed understanding and agreed to proceed.   Reason for visit: acute visit.    HPI: He has had issues with cough and congestion.  Breathing varies.  Will intermittently feel sob - some days worse than others.  He saw pulmonary.  Diagnosed with chronic bronchitis with excess mucus production.  Was instructed to start Breo.  He is not using this inhaler.  Not using nasal sprays.  States over the last 1-2 weeks, he feels his symptoms have increased.  States his head is stopped up.  Increased congestion in his throat.  Increased cough.  Thick mucus.  Some sob intermittently, but states overall he feels his breathing is stable.  Wife agrees.  No fever.  No chest pain.  No acid reflux reported.  No nausea or vomiting.  Eating.     ROS: See pertinent positives and negatives per HPI.  Past Medical History:  Diagnosis Date  . Anemia    iron deficiency  . Arthritis   . Atrial fibrillation ([place])   . Bilateral renal cysts    worked up by Dr Bernardo Heater  . BPH (benign prostatic hypertrophy)   . CAD (coronary artery disease)    s/p bypass graft x 4 (1991)  . Diverticulosis   . GERD (gastroesophageal reflux disease)   . Glaucoma   . Hypercholesterolemia   . Hypertension   . Pancreatitis    x2 (1994 and 1998), presumed to be gallstone pancreatitis.  s/p ERCP and sphincterotomy    Past Surgical History:  Procedure Laterality Date  . APPENDECTOMY    . CHOLECYSTECTOMY  1994  . CORONARY ARTERY BYPASS GRAFT     x 4,  1991  . INGUINAL HERNIA REPAIR  2009  . open heart surgery  1991    Family History  Problem Relation Age of Onset  . Throat cancer Father        oropharyngeal cancer  . Kidney disease Sister   . Heart disease Other   . Colon cancer Neg Hx   . Prostate cancer Neg Hx     SOCIAL HX: reviewed.    Current Outpatient Medications:  .  amLODipine (NORVASC) 5 MG tablet, TAKE 1 TABLET (5 MG TOTAL) BY MOUTH DAILY., Disp: 90 tablet, Rfl: 1 .  Ascorbic Acid (VITAMIN C PO), Take 1 tablet by mouth daily. Reported on 06/20/2015, Disp: , Rfl:  .  aspirin EC 81 MG tablet, Take 81 mg by mouth daily., Disp: , Rfl:  .  atorvastatin (LIPITOR) 20 MG tablet, TAKE 1 TABLET (20 MG TOTAL) BY MOUTH DAILY., Disp:  90 tablet, Rfl: 3 .  azelastine (ASTELIN) 0.1 % nasal spray, PLACE 1 SPRAY INTO BOTH NOSTRILS 2 (TWO) TIMES DAILY. USE IN EACH NOSTRIL AS DIRECTED, Disp: 16 mL, Rfl: 1 .  carbidopa-levodopa (SINEMET IR) 25-100 MG tablet, TAKE 2 TABLETS BY MOUTH 3 TIMES DAILY, Disp: , Rfl: 2 .  citalopram (CELEXA) 20 MG tablet, Take 0.5 tablets by mouth daily., Disp: , Rfl:  .  fluticasone (FLONASE) 50 MCG/ACT nasal spray, SPRAY 2 SPRAYS INTO EACH NOSTRIL EVERY DAY, Disp: 16 g, Rfl: 1 .  fluticasone furoate-vilanterol (BREO ELLIPTA) 200-25 MCG/INH AEPB, Inhale 1 puff into the lungs daily., Disp: 30 each, Rfl: 2 .  fluticasone furoate-vilanterol (BREO ELLIPTA) 200-25 MCG/INH AEPB, Inhale 1 puff into the lungs daily., Disp: 1 each,  Rfl: 0 .  furosemide (LASIX) 20 MG tablet, Take 20 mg by mouth daily as needed., Disp: , Rfl:  .  latanoprost (XALATAN) 0.005 % ophthalmic solution, Place 1 drop into both eyes at bedtime. , Disp: , Rfl:  .  MAGNESIUM PO, Take 1 tablet by mouth daily. Reported on 06/20/2015, Disp: , Rfl:  .  metoprolol tartrate (LOPRESSOR) 25 MG tablet, TAKE 1 TABLET BY MOUTH TWICE A DAY, Disp: , Rfl:  .  Multiple Vitamin (MULTIVITAMIN WITH MINERALS) TABS tablet, Take 1 tablet by mouth daily., Disp: , Rfl:  .  pantoprazole (PROTONIX) 40 MG tablet, TAKE 1 TABLET BY MOUTH DAILY, Disp: 90 tablet, Rfl: 3 .  tamsulosin (FLOMAX) 0.4 MG CAPS capsule, TAKE 1 CAPSULE (0.4 MG TOTAL) BY MOUTH DAILY., Disp: 90 capsule, Rfl: 1  EXAM:  GENERAL: alert.  Answering questions appropriately.  Sounds to be in no acute distress.    PSYCH/NEURO: pleasant and cooperative, no obvious depression or anxiety, speech and thought processing grossly intact  ASSESSMENT AND PLAN:  Discussed the following assessment and plan:  Atrial fibrillation, unspecified type (HCC)  Cough  Atrial fibrillation Has been followed by cardiology.  Off coumadin secondary to GI bleed.  Overall feels stable.    Cough Increased cough and congestion as outlined.  Breathing overall stable.  Has seen pulmonary.  Diagnosed with chronic bronchitis.  Restart breo.  Use astelin nasal spray and flonase as directed.  Robitussin DM as directed.  If persistent/worsening symptoms, consider abx.  Call with update.  Any worsening change, he will need to be evaluated.      I discussed the assessment and treatment plan with the patient. The patient was provided an opportunity to ask questions and all were answered. The patient agreed with the plan and demonstrated an understanding of the instructions.   The patient was advised to call back or seek an in-person evaluation if the symptoms worsen or if the condition fails to improve as anticipated.  I provided 25 minutes  of non-face-to-face time during this encounter.   [name], MD

## 2018-05-03 NOTE — Telephone Encounter (Signed)
Called and spoke to patient.  Pt c/o head congestion, sinus drainage w/ clear mucus and phlegm in throat.  No fever.  Pt said that he has shortness of breath but said it was related to COPD.  Pt scheduled for phone appt today w/ PCP @ 12:00 noon.  Pt prefers to be contacted on his mobile # [phone].

## 2018-05-05 ENCOUNTER — Encounter: Payer: Self-pay | Admitting: Internal Medicine

## 2018-05-05 DIAGNOSIS — R05 Cough: Secondary | ICD-10-CM | POA: Insufficient documentation

## 2018-05-05 DIAGNOSIS — R059 Cough, unspecified: Secondary | ICD-10-CM | POA: Insufficient documentation

## 2018-05-05 NOTE — Assessment & Plan Note (Signed)
Has been followed by cardiology.  Off coumadin secondary to GI bleed.  Overall feels stable.

## 2018-05-05 NOTE — Assessment & Plan Note (Signed)
Increased cough and congestion as outlined.  Breathing overall stable.  Has seen pulmonary.  Diagnosed with chronic bronchitis.  Restart breo.  Use astelin nasal spray and flonase as directed.  Robitussin DM as directed.  If persistent/worsening symptoms, consider abx.  Call with update.  Any worsening change, he will need to be evaluated.

## 2018-05-07 ENCOUNTER — Ambulatory Visit: Payer: Self-pay

## 2018-05-07 ENCOUNTER — Inpatient Hospital Stay
Admission: EM | Admit: 2018-05-07 | Discharge: 2018-05-09 | DRG: 194 | Disposition: A | Discharge status: Home Health | Payer: Medicare HMO | Attending: Internal Medicine | Admitting: Internal Medicine

## 2018-05-07 ENCOUNTER — Other Ambulatory Visit: Payer: Self-pay

## 2018-05-07 ENCOUNTER — Encounter: Payer: Self-pay | Admitting: Emergency Medicine

## 2018-05-07 ENCOUNTER — Emergency Department: Payer: Medicare HMO

## 2018-05-07 DIAGNOSIS — Z951 Presence of aortocoronary bypass graft: Secondary | ICD-10-CM

## 2018-05-07 DIAGNOSIS — Z87891 Personal history of nicotine dependence: Secondary | ICD-10-CM

## 2018-05-07 DIAGNOSIS — Z7982 Long term (current) use of aspirin: Secondary | ICD-10-CM | POA: Diagnosis not present

## 2018-05-07 DIAGNOSIS — Z9049 Acquired absence of other specified parts of digestive tract: Secondary | ICD-10-CM | POA: Diagnosis not present

## 2018-05-07 DIAGNOSIS — I700 Atherosclerosis of aorta: Secondary | ICD-10-CM | POA: Diagnosis present

## 2018-05-07 DIAGNOSIS — N400 Enlarged prostate without lower urinary tract symptoms: Secondary | ICD-10-CM | POA: Diagnosis present

## 2018-05-07 DIAGNOSIS — E7800 Pure hypercholesterolemia, unspecified: Secondary | ICD-10-CM | POA: Diagnosis present

## 2018-05-07 DIAGNOSIS — Z79899 Other long term (current) drug therapy: Secondary | ICD-10-CM

## 2018-05-07 DIAGNOSIS — R05 Cough: Secondary | ICD-10-CM | POA: Diagnosis not present

## 2018-05-07 DIAGNOSIS — I440 Atrioventricular block, first degree: Secondary | ICD-10-CM | POA: Diagnosis present

## 2018-05-07 DIAGNOSIS — Z7951 Long term (current) use of inhaled steroids: Secondary | ICD-10-CM

## 2018-05-07 DIAGNOSIS — Z03818 Encounter for observation for suspected exposure to other biological agents ruled out: Secondary | ICD-10-CM | POA: Diagnosis not present

## 2018-05-07 DIAGNOSIS — Z881 Allergy status to other antibiotic agents status: Secondary | ICD-10-CM | POA: Diagnosis not present

## 2018-05-07 DIAGNOSIS — D72829 Elevated white blood cell count, unspecified: Secondary | ICD-10-CM | POA: Diagnosis not present

## 2018-05-07 DIAGNOSIS — E785 Hyperlipidemia, unspecified: Secondary | ICD-10-CM | POA: Diagnosis not present

## 2018-05-07 DIAGNOSIS — J189 Pneumonia, unspecified organism: Secondary | ICD-10-CM | POA: Diagnosis present

## 2018-05-07 DIAGNOSIS — I2510 Atherosclerotic heart disease of native coronary artery without angina pectoris: Secondary | ICD-10-CM | POA: Diagnosis not present

## 2018-05-07 DIAGNOSIS — Z841 Family history of disorders of kidney and ureter: Secondary | ICD-10-CM | POA: Diagnosis not present

## 2018-05-07 DIAGNOSIS — H409 Unspecified glaucoma: Secondary | ICD-10-CM | POA: Diagnosis not present

## 2018-05-07 DIAGNOSIS — I10 Essential (primary) hypertension: Secondary | ICD-10-CM | POA: Diagnosis present

## 2018-05-07 DIAGNOSIS — Z880 Allergy status to penicillin: Secondary | ICD-10-CM | POA: Diagnosis not present

## 2018-05-07 DIAGNOSIS — I482 Chronic atrial fibrillation, unspecified: Secondary | ICD-10-CM | POA: Diagnosis present

## 2018-05-07 DIAGNOSIS — Z20828 Contact with and (suspected) exposure to other viral communicable diseases: Secondary | ICD-10-CM | POA: Diagnosis not present

## 2018-05-07 DIAGNOSIS — Z808 Family history of malignant neoplasm of other organs or systems: Secondary | ICD-10-CM | POA: Diagnosis not present

## 2018-05-07 DIAGNOSIS — K219 Gastro-esophageal reflux disease without esophagitis: Secondary | ICD-10-CM | POA: Diagnosis not present

## 2018-05-07 DIAGNOSIS — Z9181 History of falling: Secondary | ICD-10-CM

## 2018-05-07 LAB — COMPREHENSIVE METABOLIC PANEL
ALT: 5 U/L (ref 0–44)
AST: 25 U/L (ref 15–41)
Albumin: 3.8 g/dL (ref 3.5–5.0)
Alkaline Phosphatase: 75 U/L (ref 38–126)
Anion gap: 11 (ref 5–15)
BUN: 17 mg/dL (ref 8–23)
CO2: 22 mmol/L (ref 22–32)
Calcium: 8.5 mg/dL — ABNORMAL LOW (ref 8.9–10.3)
Chloride: 101 mmol/L (ref 98–111)
Creatinine, Ser: 0.77 mg/dL (ref 0.61–1.24)
GFR calc Af Amer: >60 mL/min (ref >60)
GFR calc non Af Amer: >60 mL/min (ref >60)
Glucose, Bld: 103 mg/dL — ABNORMAL HIGH (ref 70–99)
Potassium: 3.8 mmol/L (ref 3.5–5.1)
Sodium: 134 mmol/L — ABNORMAL LOW (ref 135–145)
Total Bilirubin: 1.2 mg/dL (ref 0.3–1.2)
Total Protein: 7 g/dL (ref 6.5–8.1)

## 2018-05-07 LAB — CBC WITH DIFFERENTIAL/PLATELET
Abs Immature Granulocytes: 0.1 10*3/uL — ABNORMAL HIGH (ref 0.00–0.07)
Basophils Absolute: 0 10*3/uL (ref 0.0–0.1)
Basophils Relative: 0 %
Eosinophils Absolute: 0 10*3/uL (ref 0.0–0.5)
Eosinophils Relative: 0 %
HCT: 33.1 % — ABNORMAL LOW (ref 39.0–52.0)
Hemoglobin: 11.1 g/dL — ABNORMAL LOW (ref 13.0–17.0)
Immature Granulocytes: 1 %
Lymphocytes Relative: 3 %
Lymphs Abs: 0.5 10*3/uL — ABNORMAL LOW (ref 0.7–4.0)
MCH: 31.1 pg (ref 26.0–34.0)
MCHC: 33.5 g/dL (ref 30.0–36.0)
MCV: 92.7 fL (ref 80.0–100.0)
Monocytes Absolute: 0.9 10*3/uL (ref 0.1–1.0)
Monocytes Relative: 6 %
Neutro Abs: 14.6 10*3/uL — ABNORMAL HIGH (ref 1.7–7.7)
Neutrophils Relative %: 90 %
Platelets: 320 10*3/uL (ref 150–400)
RBC: 3.57 MIL/uL — ABNORMAL LOW (ref 4.22–5.81)
RDW: 13.7 % (ref 11.5–15.5)
WBC: 16.2 10*3/uL — ABNORMAL HIGH (ref 4.0–10.5)
nRBC: 0 % (ref 0.0–0.2)

## 2018-05-07 LAB — CREATININE, SERUM
Creatinine, Ser: 0.81 mg/dL (ref 0.61–1.24)
GFR calc Af Amer: >60 mL/min (ref >60)
GFR calc non Af Amer: >60 mL/min (ref >60)

## 2018-05-07 LAB — LACTIC ACID, PLASMA: Lactic Acid, Venous: 1.2 mmol/L (ref 0.5–1.9)

## 2018-05-07 LAB — INFLUENZA PANEL BY PCR (TYPE A & B)
Influenza A By PCR: NEGATIVE
Influenza B By PCR: NEGATIVE

## 2018-05-07 LAB — SARS CORONAVIRUS 2 BY RT PCR (HOSPITAL ORDER, PERFORMED IN ~~LOC~~ HOSPITAL LAB): SARS Coronavirus 2: NEGATIVE

## 2018-05-07 LAB — TROPONIN I: Troponin I: <0.03 ng/mL (ref <0.03)

## 2018-05-07 LAB — ABO/RH: ABO/RH(D): O POS

## 2018-05-07 MED ORDER — LIP MEDEX EX OINT
1.0000 "application " | TOPICAL_OINTMENT | CUTANEOUS | Status: DC | PRN
  Filled 2018-05-07: qty 7

## 2018-05-07 MED ORDER — GUAIFENESIN-DM 100-10 MG/5ML PO SYRP: 5.0000 mL | ORAL_SOLUTION | ORAL | Status: DC | PRN

## 2018-05-07 MED ORDER — MAGNESIUM OXIDE 400 (241.3 MG) MG PO TABS
400.0000 mg | ORAL_TABLET | Freq: Every day | ORAL | Status: DC
  Administered 2018-05-08 – 2018-05-09 (×2): 400 mg via ORAL
  Filled 2018-05-07 (×3): qty 1

## 2018-05-07 MED ORDER — TAMSULOSIN HCL 0.4 MG PO CAPS
0.4000 mg | ORAL_CAPSULE | Freq: Every day | ORAL | Status: DC
  Filled 2018-05-07: qty 1

## 2018-05-07 MED ORDER — SODIUM CHLORIDE 0.9 % IV SOLN
1.0000 g | Freq: Once | INTRAVENOUS | Status: AC
  Administered 2018-05-07: 1 g via INTRAVENOUS
  Filled 2018-05-07: qty 10

## 2018-05-07 MED ORDER — ATORVASTATIN CALCIUM 20 MG PO TABS
20.0000 mg | ORAL_TABLET | Freq: Every day | ORAL | Status: DC
  Administered 2018-05-07 – 2018-05-08 (×2): 20 mg via ORAL
  Filled 2018-05-07 (×2): qty 1

## 2018-05-07 MED ORDER — POLYVINYL ALCOHOL 1.4 % OP SOLN
1.0000 [drp] | OPHTHALMIC | Status: DC | PRN
  Filled 2018-05-07: qty 15

## 2018-05-07 MED ORDER — ADULT MULTIVITAMIN W/MINERALS CH
1.0000 | ORAL_TABLET | Freq: Every day | ORAL | Status: DC
  Administered 2018-05-08 – 2018-05-09 (×2): 1 via ORAL
  Filled 2018-05-07 (×2): qty 1

## 2018-05-07 MED ORDER — LATANOPROST 0.005 % OP SOLN
1.0000 [drp] | Freq: Every day | OPHTHALMIC | Status: DC
  Administered 2018-05-07 – 2018-05-08 (×2): 1 [drp] via OPHTHALMIC
  Filled 2018-05-07: qty 2.5

## 2018-05-07 MED ORDER — FLUTICASONE FUROATE-VILANTEROL 200-25 MCG/INH IN AEPB
1.0000 | INHALATION_SPRAY | Freq: Every day | RESPIRATORY_TRACT | Status: DC
  Administered 2018-05-08 – 2018-05-09 (×2): 1 via RESPIRATORY_TRACT
  Filled 2018-05-07: qty 28

## 2018-05-07 MED ORDER — CITALOPRAM HYDROBROMIDE 10 MG PO TABS
10.0000 mg | ORAL_TABLET | Freq: Every day | ORAL | Status: DC
  Administered 2018-05-08 – 2018-05-09 (×2): 10 mg via ORAL
  Filled 2018-05-07 (×3): qty 1

## 2018-05-07 MED ORDER — ACETAMINOPHEN 325 MG PO TABS
650.0000 mg | ORAL_TABLET | Freq: Four times a day (QID) | ORAL | Status: DC | PRN
  Administered 2018-05-07 – 2018-05-09 (×4): 650 mg via ORAL
  Filled 2018-05-07 (×4): qty 2

## 2018-05-07 MED ORDER — HYDROCORTISONE (PERIANAL) 2.5 % EX CREA
1.0000 "application " | TOPICAL_CREAM | Freq: Four times a day (QID) | CUTANEOUS | Status: DC | PRN
  Filled 2018-05-07: qty 28.35

## 2018-05-07 MED ORDER — ENOXAPARIN SODIUM 40 MG/0.4ML ~~LOC~~ SOLN
40.0000 mg | SUBCUTANEOUS | Status: DC
  Administered 2018-05-07 – 2018-05-08 (×2): 40 mg via SUBCUTANEOUS
  Filled 2018-05-07 (×2): qty 0.4

## 2018-05-07 MED ORDER — AMLODIPINE BESYLATE 5 MG PO TABS
5.0000 mg | ORAL_TABLET | Freq: Every day | ORAL | Status: DC
  Administered 2018-05-08 – 2018-05-09 (×2): 5 mg via ORAL
  Filled 2018-05-07 (×2): qty 1

## 2018-05-07 MED ORDER — PANTOPRAZOLE SODIUM 40 MG PO TBEC
40.0000 mg | DELAYED_RELEASE_TABLET | Freq: Every day | ORAL | Status: DC
  Administered 2018-05-08 – 2018-05-09 (×2): 40 mg via ORAL
  Filled 2018-05-07 (×2): qty 1

## 2018-05-07 MED ORDER — PHENOL 1.4 % MT LIQD
1.0000 | OROMUCOSAL | Status: DC | PRN
  Filled 2018-05-07: qty 177

## 2018-05-07 MED ORDER — CARBIDOPA-LEVODOPA 25-100 MG PO TABS
1.0000 | ORAL_TABLET | Freq: Three times a day (TID) | ORAL | Status: DC
  Administered 2018-05-07 – 2018-05-09 (×5): 1 via ORAL
  Filled 2018-05-07 (×8): qty 1

## 2018-05-07 MED ORDER — LORATADINE 10 MG PO TABS: 10.0000 mg | ORAL_TABLET | Freq: Every day | ORAL | Status: DC | PRN

## 2018-05-07 MED ORDER — ALUM & MAG HYDROXIDE-SIMETH 200-200-20 MG/5ML PO SUSP: 30.0000 mL | ORAL | Status: DC | PRN

## 2018-05-07 MED ORDER — AZELASTINE HCL 0.1 % NA SOLN
1.0000 | Freq: Two times a day (BID) | NASAL | Status: DC | PRN
  Filled 2018-05-07: qty 30

## 2018-05-07 MED ORDER — METOPROLOL TARTRATE 25 MG PO TABS
25.0000 mg | ORAL_TABLET | Freq: Two times a day (BID) | ORAL | Status: DC
  Administered 2018-05-07 – 2018-05-09 (×4): 25 mg via ORAL
  Filled 2018-05-07 (×4): qty 1

## 2018-05-07 MED ORDER — IPRATROPIUM-ALBUTEROL 0.5-2.5 (3) MG/3ML IN SOLN
3.0000 mL | Freq: Four times a day (QID) | RESPIRATORY_TRACT | Status: DC
  Administered 2018-05-07 – 2018-05-08 (×5): 3 mL via RESPIRATORY_TRACT
  Filled 2018-05-07 (×5): qty 3

## 2018-05-07 MED ORDER — VITAMIN C 500 MG PO TABS
500.0000 mg | ORAL_TABLET | Freq: Every day | ORAL | Status: DC
  Administered 2018-05-07 – 2018-05-09 (×3): 500 mg via ORAL
  Filled 2018-05-07 (×3): qty 1

## 2018-05-07 MED ORDER — SODIUM CHLORIDE 0.9 % IV SOLN
1.0000 g | INTRAVENOUS | Status: DC
  Administered 2018-05-08 – 2018-05-09 (×2): 1 g via INTRAVENOUS
  Filled 2018-05-07 (×2): qty 1

## 2018-05-07 MED ORDER — SALINE SPRAY 0.65 % NA SOLN
1.0000 | NASAL | Status: DC | PRN
  Filled 2018-05-07: qty 44

## 2018-05-07 MED ORDER — DOXYCYCLINE HYCLATE 100 MG PO TABS
100.0000 mg | ORAL_TABLET | Freq: Once | ORAL | Status: AC
  Administered 2018-05-07: 100 mg via ORAL
  Filled 2018-05-07: qty 1

## 2018-05-07 MED ORDER — SODIUM CHLORIDE 0.9 % IV SOLN
500.0000 mg | INTRAVENOUS | Status: DC
  Administered 2018-05-08: 17:00:00 500 mg via INTRAVENOUS
  Filled 2018-05-07 (×2): qty 500

## 2018-05-07 MED ORDER — ASPIRIN EC 81 MG PO TBEC
81.0000 mg | DELAYED_RELEASE_TABLET | Freq: Every day | ORAL | Status: DC
  Administered 2018-05-08 – 2018-05-09 (×2): 81 mg via ORAL
  Filled 2018-05-07 (×2): qty 1

## 2018-05-07 MED ORDER — SODIUM CHLORIDE 0.9 % IV SOLN
INTRAVENOUS | Status: DC
  Administered 2018-05-07: 21:00:00 via INTRAVENOUS

## 2018-05-07 MED ORDER — FLUTICASONE PROPIONATE 50 MCG/ACT NA SUSP
1.0000 | Freq: Every day | NASAL | Status: DC | PRN
  Filled 2018-05-07 (×2): qty 16

## 2018-05-07 NOTE — ED Triage Notes (Signed)
Pt to ED by EMS with c/o of cough x2 weeks. Pt states fever this morning and increased weakness. Pt denies SOB.

## 2018-05-07 NOTE — H&P (Addendum)
[facility] [place] NAME: Tyler Stout    MR#:  [id number]  DATE OF BIRTH:  04-Dec-1925  DATE OF ADMISSION:  05/07/2018  PRIMARY CARE PHYSICIAN: [name], MD   REQUESTING/REFERRING PHYSICIAN: Gonzella Lex, MD  CHIEF COMPLAINT:   Chief Complaint  Patient presents with  . Cough  . Fever    HISTORY OF PRESENT ILLNESS:  This 85 y.o. male patient presents to the clinic for 1 week history of shortness of breath and subjective fever with a cough productive of clear mucus.  Shortness of breath has been worse over the last 2 days with increased fatigue.  Patient has taken Tylenol over the last 2 days for fever.  Shortness of breath seems to be made worse with minimal ambulation such as walking from room to room in his home.  Improved with rest.  He uses a walker for ambulation assistance.  He has no known exposure to COVID or recent travel.  However, COVID testing was completed and is negative given patient's history of shortness of breath and fever with cough.  He denies chest pain.  He denies nausea, vomiting, diarrhea.  He denies abdominal pain.  Patient has a history of coronary artery disease and atrial fibrillation.  He denies palpitations.  He denies edema.  He has no prior history of DVT or pulmonary embolism.  He is on aspirin therapy.  He has been admitted to the hospitalist service for further management.  PAST MEDICAL HISTORY:   Past Medical History:  Diagnosis Date  . Anemia    iron deficiency  . Arthritis   . Atrial fibrillation ([place])   . Bilateral renal cysts    worked up by Dr Bernardo Heater  . BPH (benign prostatic hypertrophy)   . CAD (coronary artery disease)    s/p bypass graft x 4 (1991)  . Diverticulosis   . GERD (gastroesophageal reflux disease)   . Glaucoma   . Hypercholesterolemia   . Hypertension   . Pancreatitis    x2 (1994 and 1998), presumed to be gallstone pancreatitis.  s/p ERCP and sphincterotomy    PAST SURGICAL HISTORY:   Past Surgical History:  Procedure Laterality Date  . APPENDECTOMY    . CHOLECYSTECTOMY  1994  . CORONARY ARTERY BYPASS GRAFT     x 4,  1991  . INGUINAL HERNIA REPAIR  2009  . open heart surgery  1991    SOCIAL HISTORY:   Social History   Tobacco Use  . Smoking status: Former Smoker    Packs/day: 2.00    Years: 25.00    Pack years: 50.00    Types: Cigarettes    Last attempt to quit: 01/14/1964    Years since quitting: 54.3  . Smokeless tobacco: Never Used  . Tobacco comment: smoked 2 packs/day, 1 pipe and 1 cigar once in a while.  Substance Use Topics  . Alcohol use: No    Alcohol/week: 0.0 standard drinks    FAMILY HISTORY:   Family History  Problem Relation Age of Onset  . Throat cancer Father        oropharyngeal cancer  . Kidney disease Sister   . Heart disease Other   . Colon cancer Neg Hx   . Prostate cancer Neg Hx     DRUG ALLERGIES:   Allergies  Allergen Reactions  . Ilosone [Erythromycin] Other (See Comments)    Reaction:  GI upset   . Penicillins Rash and Other (See Comments)  Has patient had a PCN reaction causing immediate rash, facial/tongue/throat swelling, SOB or lightheadedness with hypotension: No Has patient had a PCN reaction causing severe rash involving mucus membranes or skin necrosis: No Has patient had a PCN reaction that required hospitalization No Has patient had a PCN reaction occurring within the last 10 years: No If all of the above answers are "NO", then may proceed with Cephalosporin use.    REVIEW OF SYSTEMS:   Review of Systems  Constitutional: Positive for fever and malaise/fatigue.  HENT: Negative for congestion, ear discharge and sinus pain.   Eyes: Negative for blurred vision, double vision and pain.  Respiratory: Positive for cough, sputum production and shortness of breath.   Cardiovascular: Negative for chest pain, palpitations and leg swelling.  Gastrointestinal: Negative for abdominal  pain, diarrhea, nausea and vomiting.  Genitourinary: Negative for dysuria and urgency.  Musculoskeletal: Positive for falls. Negative for myalgias.  Skin: Negative for itching and rash.  Neurological: Positive for weakness and headaches. Negative for dizziness.  Endo/Heme/Allergies: Bruises/bleeds easily.  Psychiatric/Behavioral: Negative.    As per history of present illness. All pertinent systems were reviewed above. Constitutional,  HEENT, cardiovascular, respiratory, GI, GU, musculoskeletal, neuro, psychiatric, endocrine,  integumentary and hematologic systems were reviewed and are otherwise  negative/unremarkable except for positive findings mentioned above in the HPI.   MEDICATIONS AT HOME:   Prior to Admission medications   Medication Sig Start Date End Date Taking? Authorizing Provider  amLODipine (NORVASC) 5 MG tablet TAKE 1 TABLET (5 MG TOTAL) BY MOUTH DAILY. 04/12/18  Yes [name], MD  Ascorbic Acid (VITAMIN C PO) Take 1 tablet by mouth daily. Reported on 06/20/2015   Yes [provider]  aspirin EC 81 MG tablet Take 81 mg by mouth daily.   Yes [provider]  atorvastatin (LIPITOR) 20 MG tablet TAKE 1 TABLET (20 MG TOTAL) BY MOUTH DAILY. 11/12/17  Yes [name], MD  carbidopa-levodopa (SINEMET IR) 25-100 MG tablet TAKE 2 TABLETS BY MOUTH 3 TIMES DAILY 04/10/17  Yes [provider]  citalopram (CELEXA) 20 MG tablet Take 0.5 tablets by mouth daily. 10/08/15  Yes [provider]  fluticasone furoate-vilanterol (BREO ELLIPTA) 200-25 MCG/INH AEPB Inhale 1 puff into the lungs daily. 02/08/18 05/09/18 Yes Laverle Hobby, MD  latanoprost (XALATAN) 0.005 % ophthalmic solution Place 1 drop into both eyes at bedtime.    Yes [provider]  MAGNESIUM PO Take 1 tablet by mouth daily. Reported on 06/20/2015   Yes [provider]  metoprolol tartrate (LOPRESSOR) 25 MG tablet TAKE 1 TABLET BY MOUTH TWICE A DAY 05/27/16  Yes  [provider]  Multiple Vitamin (MULTIVITAMIN WITH MINERALS) TABS tablet Take 1 tablet by mouth daily.   Yes [provider]  pantoprazole (PROTONIX) 40 MG tablet TAKE 1 TABLET BY MOUTH DAILY 05/12/17  Yes [name], MD  tamsulosin (FLOMAX) 0.4 MG CAPS capsule TAKE 1 CAPSULE (0.4 MG TOTAL) BY MOUTH DAILY. 04/12/18  Yes [name], MD  azelastine (ASTELIN) 0.1 % nasal spray PLACE 1 SPRAY INTO BOTH NOSTRILS 2 (TWO) TIMES DAILY. USE IN EACH NOSTRIL AS DIRECTED 02/18/18   [name], MD  fluticasone [facility]) 50 MCG/ACT nasal spray SPRAY 2 SPRAYS INTO EACH NOSTRIL EVERY DAY 07/27/17   [name], MD  furosemide (LASIX) 20 MG tablet Take 20 mg by mouth daily as needed.    [provider]      VITAL SIGNS:  Blood pressure (!) 151/72, pulse 99, temperature 99.3 F (  37.4 C), temperature source Oral, resp. rate 18, height 5\' 6"  (1.676 m), weight 70.8 kg, SpO2 92 %.  PHYSICAL EXAMINATION:  Physical Exam Constitutional:      Appearance: Normal appearance.  HENT:     Head: Normocephalic.     Nose: Nose normal. No congestion.     Mouth/Throat:     Mouth: Mucous membranes are moist.     Pharynx: Oropharynx is clear.  Eyes:     Conjunctiva/sclera: Conjunctivae normal.     Pupils: Pupils are equal, round, and reactive to light.  Neck:     Musculoskeletal: Normal range of motion and neck supple.  Cardiovascular:     Rate and Rhythm: Normal rate and regular rhythm.     Pulses: Normal pulses.     Heart sounds: Normal heart sounds.  Pulmonary:     Effort: Pulmonary effort is normal.     Breath sounds: Wheezing and rhonchi present.  Abdominal:     General: Abdomen is flat. Bowel sounds are normal. There is no distension.     Palpations: Abdomen is soft. There is no mass.     Tenderness: There is no abdominal tenderness.  Musculoskeletal: Normal range of motion.        General: No swelling or tenderness.     Right lower leg: No edema.     Left lower  leg: No edema.  Skin:    General: Skin is warm and dry.     Capillary Refill: Capillary refill takes less than 2 seconds.     Findings: Bruising present.  Neurological:     General: No focal deficit present.     Mental Status: He is alert and oriented to person, place, and time.  Psychiatric:        Mood and Affect: Mood normal.        Behavior: Behavior normal.     .   LABORATORY PANEL:   CBC Recent Labs  Lab 05/07/18 1206  WBC 16.2*  HGB 11.1*  HCT 33.1*  PLT 320   ------------------------------------------------------------------------------------------------------------------  Chemistries  Recent Labs  Lab 05/07/18 1206  NA 134*  K 3.8  CL 101  CO2 22  GLUCOSE 103*  BUN 17  CREATININE 0.77  CALCIUM 8.5*  AST 25  ALT 5  ALKPHOS 75  BILITOT 1.2   ------------------------------------------------------------------------------------------------------------------  Cardiac Enzymes Recent Labs  Lab 05/07/18 1206  TROPONINI <0.03   ------------------------------------------------------------------------------------------------------------------  RADIOLOGY:  Dg Chest Portable 1 View  Result Date: 05/07/2018 CLINICAL DATA:  Cough for 2 weeks. EXAM: PORTABLE CHEST 1 VIEW COMPARISON:  PA and lateral chest 01/27/2018 and 08/11/2016. FINDINGS: Patchy airspace opacities are seen in the right mid and lower lung zones and left lung base. No pneumothorax or pleural effusion. There is cardiomegaly. Calcified pleural plaque left hemidiaphragm noted. IMPRESSION: Patchy right greater than left airspace disease could be due to mild interstitial edema or infection. Marked cardiomegaly. Electronically Signed   By: Inge Rise M.D.   On: 05/07/2018 12:09      IMPRESSION AND PLAN:   1. Bilateral pneumonia: Patient was started on IV antibiotic therapy with Rocephin and azithromycin.  He will have DuoNeb as needed for shortness of breath.  He has O2 at 2 L per nasal  cannula.  He has normal saline infusing through a peripheral IV at 75 cc/h.  We monitor chest x-ray for improvement. COVID 19 testing is negative.  2.  Leukocytosis: Patient started on broad-spectrum antibiotic therapy.  Will repeat CBC in the  a.m.  3.  Generalized weakness: We will consult physical therapy for supportive care.  Patient with a history of frequent falls with use of assistive ambulatory device.  4.  History of coronary artery disease: With a history of atrial fibrillation. Cardiac enzymes are negative.  He is on aspirin therapy.  Patient was placed on telemetry monitoring.    All the records are reviewed and case discussed with ED provider. The plan of care was discussed in details with the patient (and family). I answered all questions. The patient agreed to proceed with the above mentioned plan. Further management will depend upon hospital course.   CODE STATUS: Full code  TOTAL TIME TAKING CARE OF THIS PATIENT: 45 minutes.    [place] on 05/07/2018 at 4:59 PM  Pager - [phone]  After 6pm go to [email or web address] - Proofreader  [facility]  Office  [phone]  CC: Primary care physician; [name], MD   Note: This dictation was prepared with Dragon dictation along with smaller phrase technology. Any transcriptional errors that result from this process are unintentional.  Addendum  Patient seen and examined.  Case discussed with Berkley Harvey NP. All physical examination findings confirmed independently Agree with plan above  Admit inpatient floor pneumonia with IV antibiotics.  Physical therapy consultation.

## 2018-05-07 NOTE — ED Notes (Signed)
ED TO INPATIENT HANDOFF REPORT  ED Nurse Name and Phone #:  Maudie Mercury [phone]   S Name/Age/Gender Tyler Stout 85 y.o. male Room/Bed: ED33A/ED33A  Code Status   Code Status: Full Code  Home/SNF/Other Home Patient oriented to: self, place, time and situation Is this baseline? Yes   Triage Complete: Triage complete  Chief Complaint fever  Triage Note Pt to ED by EMS with c/o of cough x2 weeks. Pt states fever this morning and increased weakness. Pt denies SOB.    Allergies Allergies  Allergen Reactions  . Ilosone [Erythromycin] Other (See Comments)    Reaction:  GI upset   . Penicillins Rash and Other (See Comments)    Has patient had a PCN reaction causing immediate rash, facial/tongue/throat swelling, SOB or lightheadedness with hypotension: No Has patient had a PCN reaction causing severe rash involving mucus membranes or skin necrosis: No Has patient had a PCN reaction that required hospitalization No Has patient had a PCN reaction occurring within the last 10 years: No If all of the above answers are "NO", then may proceed with Cephalosporin use.    Level of Care/Admitting Diagnosis ED Disposition    ED Disposition Condition [facility] Area: [place] [[id number]]  Level of Care: Med-Surg [16]  Covid Evaluation: N/A  Diagnosis: PNA (pneumonia) [[id number]]  Admitting Physician: Rudene Re [[id number]]  Attending Physician: [name] [[id number]]  Estimated length of stay: past midnight tomorrow  Certification:: I certify this patient will need inpatient services for at least 2 midnights  PT Class (Do Not Modify): Inpatient [101]  PT Acc Code (Do Not Modify): Private [1]       B Medical/Surgery History Past Medical History:  Diagnosis Date  . Anemia    iron deficiency  . Arthritis   . Atrial fibrillation ([place])   . Bilateral renal cysts    worked up by Dr Bernardo Heater  . BPH (benign prostatic hypertrophy)   . CAD  (coronary artery disease)    s/p bypass graft x 4 (1991)  . Diverticulosis   . GERD (gastroesophageal reflux disease)   . Glaucoma   . Hypercholesterolemia   . Hypertension   . Pancreatitis    x2 (1994 and 1998), presumed to be gallstone pancreatitis.  s/p ERCP and sphincterotomy   Past Surgical History:  Procedure Laterality Date  . APPENDECTOMY    . CHOLECYSTECTOMY  1994  . CORONARY ARTERY BYPASS GRAFT     x 4,  1991  . INGUINAL HERNIA REPAIR  2009  . open heart surgery  1991     A IV Location/Drains/Wounds Patient Lines/Drains/Airways Status   Active Line/Drains/Airways    Name:   Placement date:   Placement time:   Site:   Days:   Peripheral IV 04/17/18 Right Antecubital   04/17/18    1149    Antecubital   20   Incision (Closed) 06/09/15 Groin Left   06/09/15    0338     1063          Intake/Output Last 24 hours No intake or output data in the 24 hours ending 05/07/18 1606  Labs/Imaging Results for orders placed or performed during the hospital encounter of 05/07/18 (from the past 48 hour(s))  CBC with Differential     Status: Abnormal   Collection Time: 05/07/18 12:06 PM  Result Value Ref Range   WBC 16.2 (H) 4.0 - 10.5 K/uL   RBC 3.57 (L) 4.22 - 5.81 MIL/uL  Hemoglobin 11.1 (L) 13.0 - 17.0 g/dL   HCT 33.1 (L) 39.0 - 52.0 %   MCV 92.7 80.0 - 100.0 fL   MCH 31.1 26.0 - 34.0 pg   MCHC 33.5 30.0 - 36.0 g/dL   RDW 13.7 11.5 - 15.5 %   Platelets 320 150 - 400 K/uL   nRBC 0.0 0.0 - 0.2 %   Neutrophils Relative % 90 %   Neutro Abs 14.6 (H) 1.7 - 7.7 K/uL   Lymphocytes Relative 3 %   Lymphs Abs 0.5 (L) 0.7 - 4.0 K/uL   Monocytes Relative 6 %   Monocytes Absolute 0.9 0.1 - 1.0 K/uL   Eosinophils Relative 0 %   Eosinophils Absolute 0.0 0.0 - 0.5 K/uL   Basophils Relative 0 %   Basophils Absolute 0.0 0.0 - 0.1 K/uL   Immature Granulocytes 1 %   Abs Immature Granulocytes 0.10 (H) 0.00 - 0.07 K/uL    Comment: Performed at [facility], [address] [place], [place] [address]  Comprehensive metabolic panel     Status: Abnormal   Collection Time: 05/07/18 12:06 PM  Result Value Ref Range   Sodium 134 (L) 135 - 145 mmol/L   Potassium 3.8 3.5 - 5.1 mmol/L   Chloride 101 98 - 111 mmol/L   CO2 22 22 - 32 mmol/L   Glucose, Bld 103 (H) 70 - 99 mg/dL   BUN 17 8 - 23 mg/dL   Creatinine, Ser 0.77 0.61 - 1.24 mg/dL   Calcium 8.5 (L) 8.9 - 10.3 mg/dL   Total Protein 7.0 6.5 - 8.1 g/dL   Albumin 3.8 3.5 - 5.0 g/dL   AST 25 15 - 41 U/L   ALT 5 0 - 44 U/L   Alkaline Phosphatase 75 38 - 126 U/L   Total Bilirubin 1.2 0.3 - 1.2 mg/dL   GFR calc non Af Amer >60 >60 mL/min   GFR calc Af Amer >60 >60 mL/min   Anion gap 11 5 - 15    Comment: Performed at [facility], [address] [place], [place] [address]  Lactic acid, plasma     Status: None   Collection Time: 05/07/18 12:06 PM  Result Value Ref Range   Lactic Acid, Venous 1.2 0.5 - 1.9 mmol/L    Comment: Performed at [facility], [address] [place], [place] [address]  Troponin I - ONCE - STAT     Status: None   Collection Time: 05/07/18 12:06 PM  Result Value Ref Range   Troponin I <0.03 <0.03 ng/mL    Comment: Performed at [facility], [address] [place], [place] [address]  SARS Coronavirus 2 [facility] order, Performed in [place] hospital lab)     Status: None   Collection Time: 05/07/18  1:05 PM  Result Value Ref Range   SARS Coronavirus 2 NEGATIVE NEGATIVE    Comment: (NOTE) If result is NEGATIVE SARS-CoV-2 target nucleic acids are NOT DETECTED. The SARS-CoV-2 RNA is generally detectable in upper and lower  respiratory specimens during the acute phase of infection. The lowest  concentration of SARS-CoV-2 viral copies this assay can detect is 250  copies / mL. A negative result does not preclude SARS-CoV-2 infection  and should not be used as the sole basis for treatment or other  patient management decisions.  A negative result may  occur with  improper specimen collection / handling, submission of specimen other  than nasopharyngeal swab, presence of viral mutation(s)  within the  areas targeted by this assay, and inadequate number of viral copies  (<250 copies / mL). A negative result must be combined with clinical  observations, patient history, and epidemiological information. If result is POSITIVE SARS-CoV-2 target nucleic acids are DETECTED. The SARS-CoV-2 RNA is generally detectable in upper and lower  respiratory specimens dur ing the acute phase of infection.  Positive  results are indicative of active infection with SARS-CoV-2.  Clinical  correlation with patient history and other diagnostic information is  necessary to determine patient infection status.  Positive results do  not rule out bacterial infection or co-infection with other viruses. If result is PRESUMPTIVE POSTIVE SARS-CoV-2 nucleic acids MAY BE PRESENT.   A presumptive positive result was obtained on the submitted specimen  and confirmed on repeat testing.  While 2019 novel coronavirus  (SARS-CoV-2) nucleic acids may be present in the submitted sample  additional confirmatory testing may be necessary for epidemiological  and / or clinical management purposes  to differentiate between  SARS-CoV-2 and other Sarbecovirus currently known to infect humans.  If clinically indicated additional testing with an alternate test  methodology [id number]) is advised. The SARS-CoV-2 RNA is generally  detectable in upper and lower respiratory sp ecimens during the acute  phase of infection. The expected result is Negative. Fact Sheet for Patients:  StrictlyIdeas.no Fact Sheet for Healthcare Providers: BankingDealers.co.za This test is not yet approved or cleared by the [place] FDA and has been authorized for detection and/or diagnosis of SARS-CoV-2 by FDA under an Emergency Use Authorization (EUA).  This  EUA will remain in effect (meaning this test can be used) for the duration of the COVID-19 declaration under Section 564(b)(1) of the Act, 21 U.S.C. section 360bbb-3(b)(1), unless the authorization is terminated or revoked sooner. Performed at [facility], [address] [place], [place] [address]    Dg Chest Portable 1 View  Result Date: 05/07/2018 CLINICAL DATA:  Cough for 2 weeks. EXAM: PORTABLE CHEST 1 VIEW COMPARISON:  PA and lateral chest 01/27/2018 and 08/11/2016. FINDINGS: Patchy airspace opacities are seen in the right mid and lower lung zones and left lung base. No pneumothorax or pleural effusion. There is cardiomegaly. Calcified pleural plaque left hemidiaphragm noted. IMPRESSION: Patchy right greater than left airspace disease could be due to mild interstitial edema or infection. Marked cardiomegaly. Electronically Signed   By: Inge Rise M.D.   On: 05/07/2018 12:09    Pending Labs Unresulted Labs (From admission, onward)    Start     Ordered   05/14/18 0500  Creatinine, serum  (enoxaparin (LOVENOX)    CrCl >/= 30 ml/min)  Weekly,   STAT    Comments:  while on enoxaparin therapy    05/07/18 1550   05/08/18 0500  CBC  Tomorrow morning,   STAT     05/07/18 1550   05/08/18 9528  Basic metabolic panel  Tomorrow morning,   STAT     05/07/18 1550   05/07/18 1546  Influenza panel by PCR (type A & B)  (Influenza PCR Panel)  Once,   STAT     05/07/18 1550   05/07/18 1520  Creatinine, serum  (enoxaparin (LOVENOX)    CrCl >/= 30 ml/min)  Once,   STAT    Comments:  Baseline for enoxaparin therapy IF NOT ALREADY DRAWN.    05/07/18 1550   05/07/18 1519  Culture, blood (routine x 2) Call MD if unable to obtain prior to antibiotics being given  BLOOD CULTURE X 2,   STAT    Comments:  If blood cultures drawn in Emergency Department - Do not draw and cancel order    05/07/18 1550   05/07/18 1519  Culture, sputum-assessment  Once,   STAT     05/07/18 1550   05/07/18  1519  Gram stain  Once,   STAT     05/07/18 1550   05/07/18 1519  HIV antibody (Routine Screening)  Once,   STAT     05/07/18 1550   05/07/18 1519  Strep pneumoniae urinary antigen  Once,   STAT     05/07/18 1550   05/07/18 1244  ABO/Rh  Once,   STAT     05/07/18 1243          Vitals/Pain Today's Vitals   05/07/18 1133 05/07/18 1134 05/07/18 1315  BP: (!) 142/67  (!) 151/72  Pulse: 99  99  Resp: 20  18  Temp: 98.9 F (37.2 C)  99.3 F (37.4 C)  TempSrc: Oral  Oral  SpO2: 93%  92%  Weight:  70.8 kg   Height:  5\' 6"  (1.676 m)   PainSc:  0-No pain     Isolation Precautions Droplet precaution  Medications Medications  enoxaparin (LOVENOX) injection 40 mg (has no administration in time range)  0.9 %  sodium chloride infusion (has no administration in time range)  cefTRIAXone (ROCEPHIN) 1 g in sodium chloride 0.9 % 100 mL IVPB (has no administration in time range)  azithromycin (ZITHROMAX) 500 mg in sodium chloride 0.9 % 250 mL IVPB (has no administration in time range)  latanoprost (XALATAN) 0.005 % ophthalmic solution 1 drop (has no administration in time range)  multivitamin with minerals tablet 1 tablet (has no administration in time range)  metoprolol tartrate (LOPRESSOR) tablet 25 mg (has no administration in time range)  aspirin EC tablet 81 mg (has no administration in time range)  carbidopa-levodopa (SINEMET IR) 25-100 MG per tablet immediate release 1 tablet (has no administration in time range)  pantoprazole (PROTONIX) EC tablet 40 mg (has no administration in time range)  fluticasone (FLONASE) 50 MCG/ACT nasal spray 1 spray (has no administration in time range)  atorvastatin (LIPITOR) tablet 20 mg (has no administration in time range)  citalopram (CELEXA) tablet 10 mg (has no administration in time range)  fluticasone furoate-vilanterol (BREO ELLIPTA) 200-25 MCG/INH 1 puff (has no administration in time range)  azelastine (ASTELIN) 0.1 % nasal spray 1 spray (has  no administration in time range)  amLODipine (NORVASC) tablet 5 mg (has no administration in time range)  tamsulosin (FLOMAX) capsule 0.4 mg (has no administration in time range)  magnesium oxide (MAG-OX) tablet 400 mg (has no administration in time range)  cefTRIAXone (ROCEPHIN) 1 g in sodium chloride 0.9 % 100 mL IVPB (0 g Intravenous Stopped 05/07/18 1348)  doxycycline (VIBRA-TABS) tablet 100 mg (100 mg Oral Given 05/07/18 1312)    Mobility walks with device Low fall risk   Focused Assessments    R Recommendations: See Admitting Provider Note  Report given to:   Additional Notes:

## 2018-05-07 NOTE — Progress Notes (Signed)
Patient requested for Tylenol for headache and did not have any PRN order. Notified Fran Lowes NP and received new order for Tylenol PRN. Order already implemented with positive results. Will continue to monitor.

## 2018-05-07 NOTE — Telephone Encounter (Signed)
Agree with need for evaluation given sob, chest tightness and fever.  Pt in ER now.

## 2018-05-07 NOTE — ED Provider Notes (Signed)
[facility] Emergency Department Provider Note  ____________________________________________  Time seen: Approximately 12:43 PM  I have reviewed the triage vital signs and the nursing notes.   HISTORY  Chief Complaint Cough and Fever   HPI GASPARE NETZEL is a 85 y.o. male with a history of anemia, atrial fibrillation, CAD status post CABG, hypertension, hyperlipidemia who presents for evaluation of cough, shortness of breath and fever.  Patient reports a cough for about a week.  Yesterday started having shortness of breath.  Started having fever last night.  Took Tylenol this morning. No CP.  Shortness of breath is mild at rest but becomes much more severe with ambulation.  No known exposure to COVID or travel to endemic regions.  No vomiting or diarrhea.  Patient is complaining of generalized weakness and had difficulty ambulating today.  Past Medical History:  Diagnosis Date  . Anemia    iron deficiency  . Arthritis   . Atrial fibrillation ([place])   . Bilateral renal cysts    worked up by Dr Bernardo Heater  . BPH (benign prostatic hypertrophy)   . CAD (coronary artery disease)    s/p bypass graft x 4 (1991)  . Diverticulosis   . GERD (gastroesophageal reflux disease)   . Glaucoma   . Hypercholesterolemia   . Hypertension   . Pancreatitis    x2 (1994 and 1998), presumed to be gallstone pancreatitis.  s/p ERCP and sphincterotomy    Patient Active Problem List   Diagnosis Date Noted  . Cough 05/05/2018  . Hyperglycemia 09/13/2017  . Decreased pedal pulses 04/01/2017  . Dizziness 02/07/2017  . Progressive supranuclear palsy ([place]) 11/02/2016  . Thoracic aortic atherosclerosis ([place]) 08/12/2016  . Abnormal CXR 07/18/2016  . Urinary frequency 06/20/2015  . Lower GI bleed   . Demand ischemia ([place])   . Chronic atrial fibrillation   . Essential hypertension   . HLD (hyperlipidemia)   . BPH (benign prostatic hyperplasia)   . Tremor 06/10/2015  . Loss of  weight 06/10/2015  . GI bleed 06/08/2015  . Health care maintenance 08/11/2014  . Light headedness 03/19/2014  . Back pain 03/13/2013  . Hypokalemia 02/20/2013  . CAD (coronary artery disease) 12/07/2011  . Hyponatremia 12/07/2011  . Atrial fibrillation ([place]) 12/07/2011  . Hypertension 11/20/2011  . Hypercholesteremia 11/20/2011  . Anemia 11/20/2011    Past Surgical History:  Procedure Laterality Date  . APPENDECTOMY    . CHOLECYSTECTOMY  1994  . CORONARY ARTERY BYPASS GRAFT     x 4,  1991  . INGUINAL HERNIA REPAIR  2009  . open heart surgery  1991    Prior to Admission medications   Medication Sig Start Date End Date Taking? Authorizing Provider  amLODipine (NORVASC) 5 MG tablet TAKE 1 TABLET (5 MG TOTAL) BY MOUTH DAILY. 04/12/18  Yes [name], MD  Ascorbic Acid (VITAMIN C PO) Take 1 tablet by mouth daily. Reported on 06/20/2015   Yes [provider]  aspirin EC 81 MG tablet Take 81 mg by mouth daily.   Yes [provider]  atorvastatin (LIPITOR) 20 MG tablet TAKE 1 TABLET (20 MG TOTAL) BY MOUTH DAILY. 11/12/17  Yes [name], MD  carbidopa-levodopa (SINEMET IR) 25-100 MG tablet TAKE 2 TABLETS BY MOUTH 3 TIMES DAILY 04/10/17  Yes [provider]  citalopram (CELEXA) 20 MG tablet Take 0.5 tablets by mouth daily. 10/08/15  Yes [provider]  fluticasone furoate-vilanterol (BREO ELLIPTA) 200-25 MCG/INH AEPB Inhale 1 puff into  the lungs daily. 02/08/18 05/09/18 Yes Laverle Hobby, MD  latanoprost (XALATAN) 0.005 % ophthalmic solution Place 1 drop into both eyes at bedtime.    Yes [provider]  MAGNESIUM PO Take 1 tablet by mouth daily. Reported on 06/20/2015   Yes [provider]  metoprolol tartrate (LOPRESSOR) 25 MG tablet TAKE 1 TABLET BY MOUTH TWICE A DAY 05/27/16  Yes [provider]  Multiple Vitamin (MULTIVITAMIN WITH MINERALS) TABS tablet Take 1 tablet by mouth daily.   Yes [provider]   pantoprazole (PROTONIX) 40 MG tablet TAKE 1 TABLET BY MOUTH DAILY 05/12/17  Yes [name], MD  tamsulosin (FLOMAX) 0.4 MG CAPS capsule TAKE 1 CAPSULE (0.4 MG TOTAL) BY MOUTH DAILY. 04/12/18  Yes [name], MD  azelastine (ASTELIN) 0.1 % nasal spray PLACE 1 SPRAY INTO BOTH NOSTRILS 2 (TWO) TIMES DAILY. USE IN EACH NOSTRIL AS DIRECTED 02/18/18   [name], MD  fluticasone [facility]) 50 MCG/ACT nasal spray SPRAY 2 SPRAYS INTO EACH NOSTRIL EVERY DAY 07/27/17   [name], MD  furosemide (LASIX) 20 MG tablet Take 20 mg by mouth daily as needed.    [provider]    Allergies Ilosone [erythromycin] and Penicillins  Family History  Problem Relation Age of Onset  . Throat cancer Father        oropharyngeal cancer  . Kidney disease Sister   . Heart disease Other   . Colon cancer Neg Hx   . Prostate cancer Neg Hx     Social History Social History   Tobacco Use  . Smoking status: Former Smoker    Packs/day: 2.00    Years: 25.00    Pack years: 50.00    Types: Cigarettes    Last attempt to quit: 01/14/1964    Years since quitting: 54.3  . Smokeless tobacco: Never Used  . Tobacco comment: smoked 2 packs/day, 1 pipe and 1 cigar once in a while.  Substance Use Topics  . Alcohol use: No    Alcohol/week: 0.0 standard drinks  . Drug use: No    Review of Systems  Constitutional: + fever, generalized weakness Eyes: Negative for visual changes. ENT: Negative for sore throat. Neck: No neck pain  Cardiovascular: Negative for chest pain. Respiratory: + shortness of breath and cough Gastrointestinal: Negative for abdominal pain, vomiting or diarrhea. Genitourinary: Negative for dysuria. Musculoskeletal: Negative for back pain. Skin: Negative for rash. Neurological: Negative for headaches, weakness or numbness. Psych: No SI or HI  ____________________________________________   PHYSICAL EXAM:  VITAL SIGNS: ED Triage Vitals  Enc Vitals Group     BP 05/07/18  1133 (!) 142/67     Pulse Rate 05/07/18 1133 99     Resp 05/07/18 1133 20     Temp 05/07/18 1133 98.9 F (37.2 C)     Temp Source 05/07/18 1133 Oral     SpO2 05/07/18 1133 93 %     Weight 05/07/18 1134 156 lb (70.8 kg)     Height 05/07/18 1134 5\' 6"  (1.676 m)     Head Circumference --      Peak Flow --      Pain Score 05/07/18 1134 0     Pain Loc --      Pain Edu? --      Excl. in [place]? --     Constitutional: Alert and oriented. Well appearing and in no apparent distress. HEENT:      Head: Normocephalic and atraumatic.  Eyes: Conjunctivae are normal. Sclera is non-icteric.       Mouth/Throat: Mucous membranes are moist.       Neck: Supple with no signs of meningismus. Cardiovascular: Regular rate and rhythm. No murmurs, gallops, or rubs. 2+ symmetrical distal pulses are present in all extremities. No JVD. Respiratory: Normal respiratory effort. Lungs are clear to auscultation bilaterally. No wheezes, crackles, or rhonchi.  Gastrointestinal: Soft, non tender, and non distended with positive bowel sounds. No rebound or guarding. Musculoskeletal: Nontender with normal range of motion in all extremities. No edema, cyanosis, or erythema of extremities. Neurologic: Normal speech and language. Face is symmetric. Moving all extremities. No gross focal neurologic deficits are appreciated. Skin: Skin is warm, dry and intact. No rash noted. Psychiatric: Mood and affect are normal. Speech and behavior are normal.  ____________________________________________   LABS (all labs ordered are listed, but only abnormal results are displayed)  Labs Reviewed  CBC WITH DIFFERENTIAL/PLATELET - Abnormal; Notable for the following components:      Result Value   WBC 16.2 (*)    RBC 3.57 (*)    Hemoglobin 11.1 (*)    HCT 33.1 (*)    Neutro Abs 14.6 (*)    Lymphs Abs 0.5 (*)    Abs Immature Granulocytes 0.10 (*)    All other components within normal limits  COMPREHENSIVE METABOLIC PANEL -  Abnormal; Notable for the following components:   Sodium 134 (*)    Glucose, Bld 103 (*)    Calcium 8.5 (*)    All other components within normal limits  SARS CORONAVIRUS 2 (HOSPITAL ORDER, [place] LAB)  LACTIC ACID, PLASMA  TROPONIN I  ABO/RH   ____________________________________________  EKG  ED ECG REPORT I, Rudene Re, the attending physician, personally viewed and interpreted this ECG.  Normal sinus rhythm with first-degree AV block, right bundle branch block, prolonged QTC, left axis deviation, no ST elevations or depressions.  Prolonged QTc is new but otherwise unchanged from prior. ____________________________________________  RADIOLOGY  I have personally reviewed the images performed during this visit and I agree with the Radiologist's read.   Interpretation by Radiologist:  Dg Chest Portable 1 View  Result Date: 05/07/2018 CLINICAL DATA:  Cough for 2 weeks. EXAM: PORTABLE CHEST 1 VIEW COMPARISON:  PA and lateral chest 01/27/2018 and 08/11/2016. FINDINGS: Patchy airspace opacities are seen in the right mid and lower lung zones and left lung base. No pneumothorax or pleural effusion. There is cardiomegaly. Calcified pleural plaque left hemidiaphragm noted. IMPRESSION: Patchy right greater than left airspace disease could be due to mild interstitial edema or infection. Marked cardiomegaly. Electronically Signed   By: Inge Rise M.D.   On: 05/07/2018 12:09     ____________________________________________   PROCEDURES  Procedure(s) performed: None Procedures Critical Care performed:  None ____________________________________________   INITIAL IMPRESSION / ASSESSMENT AND PLAN / ED COURSE   85 y.o. male with a history of anemia, atrial fibrillation, CAD status post CABG, hypertension, hyperlipidemia who presents for evaluation of cough, shortness of breath, generalized weakness and fever.  Patient with normal work of breathing,  satting 93% on room air, chest x-ray concerning for pneumonia, elevated white count of 16.2.  Lactic is normal, afebrile with no tachycardia or tachypnea therefore patient does not meet criteria for sepsis.  With no recent admissions to the hospital will treat with Rocephin and doxycycline.  COVID testing is pending.  Anticipate admission to the hospital.    _________________________ 2:52 PM on 05/07/2018 -----------------------------------------  COVID negative.  Discussed with Dr. Darvin Neighbours for admission.   As part of my medical decision making, I reviewed the following data within the [facility] notes reviewed and incorporated, Labs reviewed , EKG interpreted , Old chart reviewed, Radiograph reviewed , Discussed with admitting physician , Notes from prior ED visits and [place] Controlled Substance Database    Pertinent labs & imaging results that were available during my care of the patient were reviewed by me and considered in my medical decision making (see chart for details).    ____________________________________________   FINAL CLINICAL IMPRESSION(S) / ED DIAGNOSES  Final diagnoses:  Community acquired pneumonia, unspecified laterality      NEW MEDICATIONS STARTED DURING THIS VISIT:  ED Discharge Orders    None       Note:  This document was prepared using Dragon voice recognition software and may include unintentional dictation errors.    Alfred Levins, [place], MD 05/07/18 [id number]

## 2018-05-07 NOTE — Telephone Encounter (Signed)
Call placed to patient wife Tyler Stout. She states that Dr Nicki Reaper has been treating her husband for cough.  She states that last night his cough got worse.  She states that he sometimes coughs up phlegm that is clear. She feel his breathing is more tight today.  She states he is not struggling. He has fever of 100.2 that is a new symptom. Per protocol call was placed to office. Per Dr Nicki Reaper pt will need to go to the ER for further evaluation. Pt wife was notified and agrees to plan. Pt and wife would like EMS transport. EMS call placed and wife transferred to EMS dispatch for further instructions.  Reason for Disposition . [1] Fever > 100.0 F (37.8 C) AND [2] bedridden (e.g., nursing home patient, CVA, chronic illness, recovering from surgery)  Answer Assessment - Initial Assessment Questions 1. ONSET: "When did the cough begin?"      Last night cough was worse 2. SEVERITY: "How bad is the cough today?"      Worse that it has been 3. RESPIRATORY DISTRESS: "Describe your breathing."      Different than normal more difficult 4. FEVER: "Do you have a fever?" If so, ask: "What is your temperature, how was it measured, and when did it start?"     100.2 5. SPUTUM: "Describe the color of your sputum" (clear, white, yellow, green)     clear 6. HEMOPTYSIS: "Are you coughing up any blood?" If so ask: "How much?" (flecks, streaks, tablespoons, etc.)     No 7. CARDIAC HISTORY: "Do you have any history of heart disease?" (e.g., heart attack, congestive heart failure)      Heart surgery 8. LUNG HISTORY: "Do you have any history of lung disease?"  (e.g., pulmonary embolus, asthma, emphysema)    Sees lung doctor 9. PE RISK FACTORS: "Do you have a history of blood clots?" (or: recent major surgery, recent prolonged travel, bedridden)     No 10. OTHER SYMPTOMS: "Do you have any other symptoms?" (e.g., runny nose, wheezing, chest pain)       Runny nose, 11. PREGNANCY: "Is there any chance you are pregnant?" "When  was your last menstrual period?"      N/A 12. TRAVEL: "Have you traveled out of the country in the last month?" (e.g., travel history, exposures)       No  Protocols used: [place]

## 2018-05-08 LAB — BASIC METABOLIC PANEL
Anion gap: 8 (ref 5–15)
BUN: 13 mg/dL (ref 8–23)
CO2: 22 mmol/L (ref 22–32)
Calcium: 8.3 mg/dL — ABNORMAL LOW (ref 8.9–10.3)
Chloride: 105 mmol/L (ref 98–111)
Creatinine, Ser: 0.61 mg/dL (ref 0.61–1.24)
GFR calc Af Amer: >60 mL/min (ref >60)
GFR calc non Af Amer: >60 mL/min (ref >60)
Glucose, Bld: 106 mg/dL — ABNORMAL HIGH (ref 70–99)
Potassium: 3.5 mmol/L (ref 3.5–5.1)
Sodium: 135 mmol/L (ref 135–145)

## 2018-05-08 LAB — CBC
HCT: 31.7 % — ABNORMAL LOW (ref 39.0–52.0)
Hemoglobin: 10.6 g/dL — ABNORMAL LOW (ref 13.0–17.0)
MCH: 30.8 pg (ref 26.0–34.0)
MCHC: 33.4 g/dL (ref 30.0–36.0)
MCV: 92.2 fL (ref 80.0–100.0)
Platelets: 296 10*3/uL (ref 150–400)
RBC: 3.44 MIL/uL — ABNORMAL LOW (ref 4.22–5.81)
RDW: 13.8 % (ref 11.5–15.5)
WBC: 11.4 10*3/uL — ABNORMAL HIGH (ref 4.0–10.5)
nRBC: 0 % (ref 0.0–0.2)

## 2018-05-08 MED ORDER — TAMSULOSIN HCL 0.4 MG PO CAPS
0.4000 mg | ORAL_CAPSULE | Freq: Every day | ORAL | Status: DC
  Administered 2018-05-08: 12:00:00 0.4 mg via ORAL
  Filled 2018-05-08 (×2): qty 1

## 2018-05-08 NOTE — Progress Notes (Signed)
[name]: Tyler Stout, male   DOB: 1925/10/12, 85 y.o.   MRN: [id number]  [place] PROGRESS NOTE  Tyler Stout HYW:[id number] DOB: March 22, 1925 DOA: 05/07/2018 PCP: [name], MD  HPI/Subjective: Patient feeling well.  Walked around the nursing station with physical therapy.  He had a fever at home and some cough.  Objective: Vitals:   05/08/18 1300 05/08/18 1442  BP:  121/70  Pulse:  99  Resp:  17  Temp:  98.2 F (36.8 C)  SpO2: 92% 94%    Filed Weights   05/07/18 1134  Weight: 70.8 kg    ROS: Review of Systems  Constitutional: Positive for fever. Negative for chills.  Eyes: Negative for blurred vision.  Respiratory: Positive for cough. Negative for shortness of breath.   Cardiovascular: Negative for chest pain.  Gastrointestinal: Negative for abdominal pain, constipation, diarrhea, nausea and vomiting.  Genitourinary: Negative for dysuria.  Musculoskeletal: Negative for joint pain.  Neurological: Negative for dizziness and headaches.   Exam: Physical Exam  Constitutional: He is oriented to person, place, and time.  HENT:  Nose: No mucosal edema.  Mouth/Throat: No oropharyngeal exudate or posterior oropharyngeal edema.  Eyes: Pupils are equal, round, and reactive to light. Conjunctivae, EOM and lids are normal.  Neck: No JVD present. Carotid bruit is not present. No edema present. No thyroid mass and no thyromegaly present.  Cardiovascular: S1 normal and S2 normal. Exam reveals no gallop.  No murmur heard. Pulses:      Dorsalis pedis pulses are 2+ on the right side and 2+ on the left side.  Respiratory: No respiratory distress. He has no wheezes. He has no rhonchi. He has no rales.  GI: Soft. Bowel sounds are normal. There is no abdominal tenderness.  Musculoskeletal:     Right ankle: He exhibits no swelling.     Left ankle: He exhibits no swelling.  Lymphadenopathy:    He has no cervical adenopathy.  Neurological: He is alert and oriented to  person, place, and time. No cranial nerve deficit.  Skin: Skin is warm. No rash noted. Nails show no clubbing.  Psychiatric: He has a normal mood and affect.      Data Reviewed: Basic Metabolic Panel: Recent Labs  Lab 05/07/18 1206 05/07/18 1842 05/08/18 0608  NA 134*  --  135  K 3.8  --  3.5  CL 101  --  105  CO2 22  --  22  GLUCOSE 103*  --  106*  BUN 17  --  13  CREATININE 0.77 0.81 0.61  CALCIUM 8.5*  --  8.3*   Liver Function Tests: Recent Labs  Lab 05/07/18 1206  AST 25  ALT 5  ALKPHOS 75  BILITOT 1.2  PROT 7.0  ALBUMIN 3.8   No results for input(s): LIPASE, AMYLASE in the last 168 hours. No results for input(s): AMMONIA in the last 168 hours. CBC: Recent Labs  Lab 05/07/18 1206 05/08/18 0608  WBC 16.2* 11.4*  NEUTROABS 14.6*  --   HGB 11.1* 10.6*  HCT 33.1* 31.7*  MCV 92.7 92.2  PLT 320 296   Cardiac Enzymes: Recent Labs  Lab 05/07/18 1206  TROPONINI <0.03     Recent Results (from the past 240 hour(s))  SARS Coronavirus 2 [facility] order, Performed in [place] hospital lab)     Status: None   Collection Time: 05/07/18  1:05 PM  Result Value Ref Range Status   SARS Coronavirus 2 NEGATIVE NEGATIVE Final  Comment: (NOTE) If result is NEGATIVE SARS-CoV-2 target nucleic acids are NOT DETECTED. The SARS-CoV-2 RNA is generally detectable in upper and lower  respiratory specimens during the acute phase of infection. The lowest  concentration of SARS-CoV-2 viral copies this assay can detect is 250  copies / mL. A negative result does not preclude SARS-CoV-2 infection  and should not be used as the sole basis for treatment or other  patient management decisions.  A negative result may occur with  improper specimen collection / handling, submission of specimen other  than nasopharyngeal swab, presence of viral mutation(s) within the  areas targeted by this assay, and inadequate number of viral copies  (<250 copies / mL). A negative result must  be combined with clinical  observations, patient history, and epidemiological information. If result is POSITIVE SARS-CoV-2 target nucleic acids are DETECTED. The SARS-CoV-2 RNA is generally detectable in upper and lower  respiratory specimens dur ing the acute phase of infection.  Positive  results are indicative of active infection with SARS-CoV-2.  Clinical  correlation with patient history and other diagnostic information is  necessary to determine patient infection status.  Positive results do  not rule out bacterial infection or co-infection with other viruses. If result is PRESUMPTIVE POSTIVE SARS-CoV-2 nucleic acids MAY BE PRESENT.   A presumptive positive result was obtained on the submitted specimen  and confirmed on repeat testing.  While 2019 novel coronavirus  (SARS-CoV-2) nucleic acids may be present in the submitted sample  additional confirmatory testing may be necessary for epidemiological  and / or clinical management purposes  to differentiate between  SARS-CoV-2 and other Sarbecovirus currently known to infect humans.  If clinically indicated additional testing with an alternate test  methodology [phone]) is advised. The SARS-CoV-2 RNA is generally  detectable in upper and lower respiratory sp ecimens during the acute  phase of infection. The expected result is Negative. Fact Sheet for Patients:  StrictlyIdeas.no Fact Sheet for Healthcare Providers: BankingDealers.co.za This test is not yet approved or cleared by the [place] FDA and has been authorized for detection and/or diagnosis of SARS-CoV-2 by FDA under an Emergency Use Authorization (EUA).  This EUA will remain in effect (meaning this test can be used) for the duration of the COVID-19 declaration under Section 564(b)(1) of the Act, 21 U.S.C. section 360bbb-3(b)(1), unless the authorization is terminated or revoked sooner. Performed at [facility], [address] [place], [place] [address]   Culture, blood (routine x 2) Call MD if unable to obtain prior to antibiotics being given     Status: None (Preliminary result)   Collection Time: 05/07/18  6:42 PM  Result Value Ref Range Status   Specimen Description BLOOD BLOOD LEFT ARM  Final   Special Requests   Final    BOTTLES DRAWN AEROBIC AND ANAEROBIC Blood Culture results may not be optimal due to an excessive volume of blood received in culture bottles   Culture   Final    NO GROWTH < 12 HOURS Performed at [facility], [address], [place], [place] [address]    Report Status PENDING  Incomplete  Culture, blood (routine x 2) Call MD if unable to obtain prior to antibiotics being given     Status: None (Preliminary result)   Collection Time: 05/07/18  6:52 PM  Result Value Ref Range Status   Specimen Description BLOOD BLOOD RIGHT HAND  Final   Special Requests   Final    BOTTLES DRAWN AEROBIC  AND ANAEROBIC Blood Culture adequate volume   Culture   Final    NO GROWTH < 12 HOURS Performed at [facility], [address] [place], [place] [address]    Report Status PENDING  Incomplete     Studies: Dg Chest Portable 1 View  Result Date: 05/07/2018 CLINICAL DATA:  Cough for 2 weeks. EXAM: PORTABLE CHEST 1 VIEW COMPARISON:  PA and lateral chest 01/27/2018 and 08/11/2016. FINDINGS: Patchy airspace opacities are seen in the right mid and lower lung zones and left lung base. No pneumothorax or pleural effusion. There is cardiomegaly. Calcified pleural plaque left hemidiaphragm noted. IMPRESSION: Patchy right greater than left airspace disease could be due to mild interstitial edema or infection. Marked cardiomegaly. Electronically Signed   By: Inge Rise M.D.   On: 05/07/2018 12:09    Scheduled Meds: . amLODipine  5 mg Oral Daily  . aspirin EC  81 mg Oral Daily  . atorvastatin  20 mg Oral q1800  . carbidopa-levodopa  1 tablet Oral TID  . citalopram   10 mg Oral Daily  . enoxaparin (LOVENOX) injection  40 mg Subcutaneous Q24H  . fluticasone furoate-vilanterol  1 puff Inhalation Daily  . ipratropium-albuterol  3 mL Nebulization Q6H  . latanoprost  1 drop Both Eyes QHS  . magnesium oxide  400 mg Oral Daily  . metoprolol tartrate  25 mg Oral BID  . multivitamin with minerals  1 tablet Oral Daily  . pantoprazole  40 mg Oral Daily  . tamsulosin  0.4 mg Oral QPC supper  . vitamin C  500 mg Oral Daily   Continuous Infusions: . azithromycin    . cefTRIAXone (ROCEPHIN)  IV 1 g (05/08/18 1146)    Assessment/Plan:  1. Pneumonia bilateral with leukocytosis.  Patient on Rocephin and Zithromax.  COVID-19 testing negative.  Watch again overnight and reevaluate tomorrow for potential discharge. 2. Hypertension on Norvasc metoprolol 3. Hyperlipidemia unspecified on atorvastatin 4. GERD on PPI 5. Glaucoma unspecified on latanoprost 6. BPH on Flomax  Code Status:     Code Status Orders  (From admission, onward)         Start     Ordered   05/07/18 1526  Full code  Continuous     05/07/18 1550        Code Status History    Date Active Date Inactive Code Status Order ID Comments User Context   06/09/2015 0206 06/14/2015 1948 Full Code [id number]  [place] Nickel, MD Inpatient   06/08/2015 2147 06/09/2015 0206 Full Code [id number]  Fritzi Mandes, MD Inpatient   03/06/2015 2117 03/07/2015 1714 Full Code [id number]  Bettey Costa, MD Inpatient     Family Communication: Spoke with wife on the phone disposition Plan: Potential disposition tomorrow  Antibiotics:  Rocephin  And Zithromax  Time spent: 28 minutes  [place]

## 2018-05-08 NOTE — Evaluation (Signed)
Physical Therapy Evaluation Patient Details Name: Tyler Stout MRN: [id number] DOB: Oct 15, 1925 Today's Date: 05/08/2018   History of Present Illness  pt is a 85 y.o M  with CC of cough and fever with progressive worsening over the last 2 days especially with ambulation using RW and was addmitted on 05/07/2018 and dx with pnuemonia. He had been tested for COVID-19 and was ruled negative.   Clinical Impression  Pt is pleasant and received in bed. He is A&O x 4 and currently denied any pain. He was MODi with bed mobility and transfers and demonstrated overall strength 4/5. He was able to amb  With RW and use bathroom and maintain balance in sustained squat position for ~5 min to perform personal hygiene. He amb ~300 ft with RW exhibiting mild festination with turns. He was able to return to recliner and perform exercises but does fatigue quickly. SPo2 remained ~95% throughout session on room air. He would benefit from continued physical therapy due to history of falls and to maximize benefit / understanding of proper DME use. Upon discharge he would benefit from continued strengthening / balance training via [place].    Follow Up Recommendations Home health PT    Equipment Recommendations  None recommended by PT    Recommendations for Other Services       Precautions / Restrictions Precautions Precautions: None Restrictions Weight Bearing Restrictions: No      Mobility  Bed Mobility Overal bed mobility: Modified Independent             General bed mobility comments: increased time with rolling and supine to sit on EOB  Transfers Overall transfer level: Modified independent Equipment used: Rolling walker (2 wheeled)                Ambulation/Gait Ambulation/Gait assistance: Modified independent (Device/Increase time) Gait Distance (Feet): 300 Feet Assistive device: Rolling walker (2 wheeled) Gait Pattern/deviations: Step-through pattern;Festinating;Trunk  flexed;Trendelenburg     General Gait Details: (intermittent festination noted )  Stairs            Wheelchair Mobility    Modified Rankin (Stroke Patients Only)       Balance Overall balance assessment: History of Falls(pt noted atleast 2 in the last 6 months)                                           Pertinent Vitals/Pain Pain Assessment: No/denies pain    Home Living Family/patient expects to be discharged to:: Private residence Living Arrangements: Spouse/significant other;Other relatives   Type of Home: House Home Access: Ramped entrance     [place]: One level;Other (Comment)(3-4 steps to get into end with railing on both sides) Home Equipment: Walker - 2 wheels;Cane - single point      Prior Function Level of Independence: Independent with assistive device(s)         Comments: used SPC at home and RW when in community     Hand Dominance   Dominant Hand: Right    Extremity/Trunk Assessment   Upper Extremity Assessment Upper Extremity Assessment: RUE deficits/detail;LUE deficits/detail RUE Deficits / Details: general strength 4/5 LUE Deficits / Details: general strength 4/5    Lower Extremity Assessment Lower Extremity Assessment: LLE deficits/detail;RLE deficits/detail RLE Deficits / Details: general strength 4/5 LLE Deficits / Details: general strength 4/5       Communication   Communication: No  difficulties  Cognition Arousal/Alertness: Awake/alert Behavior During Therapy: WFL for tasks assessed/performed Overall Cognitive Status: Within Functional Limits for tasks assessed                                        General Comments      Exercises Other Exercises Other Exercises: standing marching with RW alternating L/R 2 x 20, bil shoulder flexion 2 x 10 with tactile cues Other Exercises: 2 x 5 sit to stand from recliner   Assessment/Plan    PT Assessment Patient needs continued PT services   PT Problem List Decreased activity tolerance;Decreased balance;Decreased mobility;Decreased strength       PT Treatment Interventions Gait training;Functional mobility training;Therapeutic activities;Therapeutic exercise;Balance training    PT Goals (Current goals can be found in the Care Plan section)  Acute Rehab PT Goals Patient Stated Goal: to go home PT Goal Formulation: With patient Time For Goal Achievement: 05/22/18 Potential to Achieve Goals: Good    Frequency Min 2X/week   Barriers to discharge        Co-evaluation               AM-PAC PT "6 Clicks" Mobility  Outcome Measure Help needed turning from your back to your side while in a flat bed without using bedrails?: A Little Help needed moving from lying on your back to sitting on the side of a flat bed without using bedrails?: A Little Help needed moving to and from a bed to a chair (including a wheelchair)?: A Little Help needed standing up from a chair using your arms (e.g., wheelchair or bedside chair)?: A Little Help needed to walk in hospital room?: A Little Help needed climbing 3-5 steps with a railing? : A Lot 6 Click Score: 17    End of Session Equipment Utilized During Treatment: Gait belt Activity Tolerance: Patient tolerated treatment well Patient left: in chair;with chair alarm set;with call bell/phone within reach Nurse Communication: Other (comment)(how session went) PT Visit Diagnosis: Unsteadiness on feet (R26.81);Other abnormalities of gait and mobility (R26.89);History of falling (Z91.81)    Time: 0920-1005 PT Time Calculation (min) (ACUTE ONLY): 45 min   Charges:   PT Evaluation $PT Eval Low Complexity: 1 Low PT Treatments $Gait Training: 8-22 mins $Therapeutic Exercise: 8-22 mins        Hutchinson Isenberg PT, DPT, LAT, ATC  05/08/18  11:28 AM       [name] 05/08/2018, 11:23 AM

## 2018-05-09 LAB — STREP PNEUMONIAE URINARY ANTIGEN: Strep Pneumo Urinary Antigen: NEGATIVE

## 2018-05-09 LAB — HIV ANTIBODY (ROUTINE TESTING W REFLEX): HIV Screen 4th Generation wRfx: NONREACTIVE

## 2018-05-09 MED ORDER — AZITHROMYCIN 250 MG PO TABS: ORAL_TABLET | ORAL | 0 refills | Status: AC

## 2018-05-09 MED ORDER — CEFDINIR 300 MG PO CAPS: 300.0000 mg | ORAL_CAPSULE | Freq: Two times a day (BID) | ORAL | 0 refills | Status: AC

## 2018-05-09 MED ORDER — IPRATROPIUM-ALBUTEROL 0.5-2.5 (3) MG/3ML IN SOLN
3.0000 mL | Freq: Four times a day (QID) | RESPIRATORY_TRACT | Status: DC | PRN
  Administered 2018-05-09: 3 mL via RESPIRATORY_TRACT

## 2018-05-09 MED ORDER — GUAIFENESIN-DM 100-10 MG/5ML PO SYRP: 5.0000 mL | ORAL_SOLUTION | Freq: Three times a day (TID) | ORAL | 0 refills | Status: DC | PRN

## 2018-05-09 MED ORDER — AZITHROMYCIN 500 MG PO TABS
250.0000 mg | ORAL_TABLET | Freq: Every day | ORAL | Status: DC
  Administered 2018-05-09: 250 mg via ORAL
  Filled 2018-05-09: qty 1

## 2018-05-09 MED ORDER — IPRATROPIUM-ALBUTEROL 0.5-2.5 (3) MG/3ML IN SOLN
3.0000 mL | Freq: Three times a day (TID) | RESPIRATORY_TRACT | Status: DC
  Filled 2018-05-09: qty 3

## 2018-05-09 NOTE — TOC Initial Note (Signed)
Transition of Care [facility]) - Initial/Assessment Note    Patient Details  Name: Tyler Stout MRN: [id number] Date of Birth: 1925/09/10  Transition of [facility]) CM/SW Contact:    Su Hilt, RN Phone Number: 05/09/2018, 10:31 AM  Clinical Narrative:                  Met with the patient to [place] and needs He stated that he lives in a single story home with his wife He has a daughter and grand daughter that helps when needed His daughter and wife provide transportation His wife is a retired Marine scientist He has had PT at home in the past and would like to use [place] has agreed to accept the patient with [facility] He has a rolling walker at home and grab bars and stated that he has no need for any other equipment He sees Dr. Nicki Reaper for PCP and gets medication from [facility] He has no problem affording his medications   Expected Discharge Plan: [place] Barriers to Discharge: Barriers Resolved   Patient Goals and CMS Choice Patient states their goals for this hospitalization and ongoing recovery are:: go home CMS Medicare.gov Compare Post Acute Care list provided to:: Patient Choice offered to / list presented to : Patient  Expected Discharge Plan and Services Expected Discharge Plan: [place]   Discharge Planning Services: CM Consult Post Acute Care Choice: [place] arrangements for the past 2 months: Single Family Home Expected Discharge Date: 05/09/18               DME Arranged: (none needed)         HH Arranged: PT HH Agency: [facility] Date [place]: 05/09/18 Time [place]: 81 Representative spoke with at [place]: [place]  Prior Living Arrangements/Services Living arrangements for the past 2 months: [place] with:: Spouse Patient language and need for interpreter reviewed:: No Do you feel safe going back to the place where you live?: Yes       Need for Family Participation in Patient Care: No (Comment) Care giver support system in place?: Yes (comment) Current home services: DME(walker) Criminal Activity/Legal Involvement Pertinent to Current Situation/Hospitalization: No - Comment as needed  Activities of Daily Living Home Assistive Devices/Equipment: Dentures (specify type), Hearing aid ADL Screening (condition at time of admission) Patient's cognitive ability adequate to safely complete daily activities?: Yes Is the patient deaf or have difficulty hearing?: Yes Does the patient have difficulty seeing, even when wearing glasses/contacts?: Yes Does the patient have difficulty concentrating, remembering, or making decisions?: No Patient able to express need for assistance with ADLs?: Yes Does the patient have difficulty dressing or bathing?: Yes Independently performs ADLs?: No Does the patient have difficulty walking or climbing stairs?: Yes Weakness of Legs: Both Weakness of Arms/Hands: None  Permission Sought/Granted                  Emotional Assessment Appearance:: Appears stated age Attitude/Demeanor/Rapport: Engaged Affect (typically observed): Accepting Orientation: : Oriented to Self, Oriented to Place, Oriented to  Time, Oriented to Situation Alcohol / Substance Use: Never Used Psych Involvement: No (comment)  Admission diagnosis:  Community acquired pneumonia, unspecified laterality [J18.9] Patient Active Problem List   Diagnosis Date Noted  . PNA (pneumonia) 05/07/2018  . Cough 05/05/2018  . Hyperglycemia 09/13/2017  . Decreased pedal pulses 04/01/2017  . Dizziness 02/07/2017  . 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 Thoracic aortic atherosclerosis ([place]) 08/12/2016  . Abnormal CXR 07/18/2016  . Urinary frequency 06/20/2015  . Lower GI bleed   . Demand ischemia ([place])   . Chronic atrial fibrillation   . Essential hypertension   . HLD (hyperlipidemia)   . BPH (benign prostatic  hyperplasia)   . Tremor 06/10/2015  . Loss of weight 06/10/2015  . GI bleed 06/08/2015  . Health care maintenance 08/11/2014  . Light headedness 03/19/2014  . Back pain 03/13/2013  . Hypokalemia 02/20/2013  . CAD (coronary artery disease) 12/07/2011  . Hyponatremia 12/07/2011  . Atrial fibrillation ([place]) 12/07/2011  . Hypertension 11/20/2011  . Hypercholesteremia 11/20/2011  . Anemia 11/20/2011   PCP:  [name], MD Pharmacy:   [id number] [id number]: [phone]: [address] [id number]: [phone]: [phone]    Social Determinants of Health (SDOH) Interventions    Readmission Risk Interventions No flowsheet data found.

## 2018-05-09 NOTE — Progress Notes (Signed)
Pt. Discharged to home w/HH via private vehicle. Discharge instructions and medication regimen reviewed at bedside on phone with patient and wife. Pt and wife verbalize understanding of instructions and medication regimen. Prescriptions sent to pharmacy. Patient assessment unchanged from this morning. IV discontinued per policy.

## 2018-05-09 NOTE — Discharge Instructions (Signed)
Community-Acquired Pneumonia, Adult  Pneumonia is an infection of the lungs. It causes swelling in the airways of the lungs. Mucus and fluid may also build up inside the airways.  One type of pneumonia can happen while a person is in a hospital. A different type can happen when a person is not in a hospital (community-acquired pneumonia).   What are the causes?    This condition is caused by germs (viruses, bacteria, or fungi). Some types of germs can be passed from one person to another. This can happen when you breathe in droplets from the cough or sneeze of an infected person.  What increases the risk?  You are more likely to develop this condition if you:   Have a long-term (chronic) disease, such as:  ? Chronic obstructive pulmonary disease (COPD).  ? Asthma.  ? Cystic fibrosis.  ? Congestive heart failure.  ? Diabetes.  ? Kidney disease.   Have HIV.   Have sickle cell disease.   Have had your spleen removed.   Do not take good care of your teeth and mouth (poor dental hygiene).   Have a medical condition that increases the risk of breathing in droplets from your own mouth and nose.   Have a weakened body defense system (immune system).   Are a smoker.   Travel to areas where the germs that cause this illness are common.   Are around certain animals or the places they live.  What are the signs or symptoms?   A dry cough.   A wet (productive) cough.   Fever.   Sweating.   Chest pain. This often happens when breathing deeply or coughing.   Fast breathing or trouble breathing.   Shortness of breath.   Shaking chills.   Feeling tired (fatigue).   Muscle aches.  How is this treated?  Treatment for this condition depends on many things. Most adults can be treated at home. In some cases, treatment must happen in a hospital. Treatment may include:   Medicines given by mouth or through an IV tube.   Being given extra oxygen.   Respiratory therapy.  In rare cases, treatment for very bad pneumonia  may include:   Using a machine to help you breathe.   Having a procedure to remove fluid from around your lungs.  Follow these instructions at home:  Medicines   Take over-the-counter and prescription medicines only as told by your doctor.  ? Only take cough medicine if you are losing sleep.   If you were prescribed an antibiotic medicine, take it as told by your doctor. Do not stop taking the antibiotic even if you start to feel better.  General instructions     Sleep with your head and neck raised (elevated). You can do this by sleeping in a recliner or by putting a few pillows under your head.   Rest as needed. Get at least 8 hours of sleep each night.   Drink enough water to keep your pee (urine) pale yellow.   Eat a healthy diet that includes plenty of vegetables, fruits, whole grains, low-fat dairy products, and lean protein.   Do not use any products that contain nicotine or tobacco. These include cigarettes, e-cigarettes, and chewing tobacco. If you need help quitting, ask your doctor.   Keep all follow-up visits as told by your doctor. This is important.  How is this prevented?  A shot (vaccine) can help prevent pneumonia. Shots are often suggested for:   People   older than 83 years of age.   People older than 83 years of age who:  ? Are having cancer treatment.  ? Have long-term (chronic) lung disease.  ? Have problems with their body's defense system.  You may also prevent pneumonia if you take these actions:   Get the flu (influenza) shot every year.   Go to the dentist as often as told.   Wash your hands often. If you cannot use soap and water, use hand sanitizer.  Contact a doctor if:   You have a fever.   You lose sleep because your cough medicine does not help.  Get help right away if:   You are short of breath and it gets worse.   You have more chest pain.   Your sickness gets worse. This is very serious if:  ? You are an older adult.  ? Your body's defense system is weak.   You  cough up blood.  Summary   Pneumonia is an infection of the lungs.   Most adults can be treated at home. Some will need treatment in a hospital.   Drink enough water to keep your pee pale yellow.   Get at least 8 hours of sleep each night.  This information is not intended to replace advice given to you by your health care provider. Make sure you discuss any questions you have with your health care provider.  Document Released: 06/18/2007 Document Revised: 08/27/2017 Document Reviewed: 08/27/2017  Elsevier Interactive Patient Education  2019 [employer]

## 2018-05-09 NOTE — Progress Notes (Signed)
[name]: Tyler Stout, male   DOB: 06-30-1925, 85 y.o.   MRN: [id number] [facility] [place] NAME: Tyler Stout    MR#:  [id number]  DATE OF BIRTH:  1925/11/28  DATE OF ADMISSION:  05/07/2018 ADMITTING PHYSICIAN: Hillary Bow, MD  DATE OF DISCHARGE: 05/09/2018 11:25 AM  PRIMARY CARE PHYSICIAN: [name], MD    ADMISSION DIAGNOSIS:  Community acquired pneumonia, unspecified laterality [J18.9]  DISCHARGE DIAGNOSIS:  Active Problems:   PNA (pneumonia)   SECONDARY DIAGNOSIS:   Past Medical History:  Diagnosis Date  . Anemia    iron deficiency  . Arthritis   . Atrial fibrillation ([place])   . Bilateral renal cysts    worked up by Dr Bernardo Heater  . BPH (benign prostatic hypertrophy)   . CAD (coronary artery disease)    s/p bypass graft x 4 (1991)  . Diverticulosis   . GERD (gastroesophageal reflux disease)   . Glaucoma   . Hypercholesterolemia   . Hypertension   . Pancreatitis    x2 (1994 and 1998), presumed to be gallstone pancreatitis.  s/p ERCP and sphincterotomy    HOSPITAL COURSE:   1.  Pneumonia bilaterally with leukocytosis.  The patient was given Rocephin and Zithromax during the hospital course.  COVID-19 testing was negative.  Patient felt better and wanted to go home.  He received 3 doses of Rocephin and Zithromax during the hospital course.  Prescribe 2 more doses of Zithromax starting tomorrow.  Change Rocephin over to [facility] for another 4 days starting tomorrow.  Patient remained afebrile and white blood cell count trended better. 2.  Hypertension on Norvasc and metoprolol 3.  Hyperlipidemia unspecified on atorvastatin 4.  GERD on PPI 5.  Glaucoma unspecified on latanoprost 6.  BPH on Flomax  DISCHARGE CONDITIONS:   Follow-up PMD 5 days  CONSULTS OBTAINED:  None  DRUG ALLERGIES:   Allergies  Allergen Reactions  . Ilosone [Erythromycin] Other (See Comments)    Reaction:  GI upset   . Penicillins  Rash and Other (See Comments)    Has patient had a PCN reaction causing immediate rash, facial/tongue/throat swelling, SOB or lightheadedness with hypotension: No Has patient had a PCN reaction causing severe rash involving mucus membranes or skin necrosis: No Has patient had a PCN reaction that required hospitalization No Has patient had a PCN reaction occurring within the last 10 years: No If all of the above answers are "NO", then may proceed with Cephalosporin use.    DISCHARGE MEDICATIONS:   Allergies as of 05/09/2018      Reactions   Ilosone [erythromycin] Other (See Comments)   Reaction:  GI upset    Penicillins Rash, Other (See Comments)   Has patient had a PCN reaction causing immediate rash, facial/tongue/throat swelling, SOB or lightheadedness with hypotension: No Has patient had a PCN reaction causing severe rash involving mucus membranes or skin necrosis: No Has patient had a PCN reaction that required hospitalization No Has patient had a PCN reaction occurring within the last 10 years: No If all of the above answers are "NO", then may proceed with Cephalosporin use.      Medication List    TAKE these medications   amLODipine 5 MG tablet Commonly known as:  NORVASC TAKE 1 TABLET (5 MG TOTAL) BY MOUTH DAILY.   aspirin EC 81 MG tablet Take 81 mg by mouth daily.   atorvastatin 20 MG tablet Commonly known as:  LIPITOR TAKE 1 TABLET (  20 MG TOTAL) BY MOUTH DAILY.   azelastine 0.1 % nasal spray Commonly known as:  ASTELIN PLACE 1 SPRAY INTO BOTH NOSTRILS 2 (TWO) TIMES DAILY. USE IN EACH NOSTRIL AS DIRECTED   azithromycin 250 MG tablet Commonly known as:  ZITHROMAX One tab po daily for two days starting 05/10/2018 Start taking on:  May 10, 2018   carbidopa-levodopa 25-100 MG tablet Commonly known as:  SINEMET IR TAKE 2 TABLETS BY MOUTH 3 TIMES DAILY   cefdinir 300 MG capsule Commonly known as:  OMNICEF Take 1 capsule (300 mg total) by mouth 2 (two) times daily  for 4 days. Start taking on:  May 10, 2018   citalopram 20 MG tablet Commonly known as:  CELEXA Take 0.5 tablets by mouth daily.   fluticasone 50 MCG/ACT nasal spray Commonly known as:  FLONASE SPRAY 2 SPRAYS INTO EACH NOSTRIL EVERY DAY   fluticasone furoate-vilanterol 200-25 MCG/INH Aepb Commonly known as:  Breo Ellipta Inhale 1 puff into the lungs daily.   furosemide 20 MG tablet Commonly known as:  LASIX Take 20 mg by mouth daily as needed.   guaiFENesin-dextromethorphan 100-10 MG/5ML syrup Commonly known as:  ROBITUSSIN DM Take 5 mLs by mouth every 8 (eight) hours as needed for cough (chest congestion).   MAGNESIUM PO Take 1 tablet by mouth daily. Reported on 06/20/2015   metoprolol tartrate 25 MG tablet Commonly known as:  LOPRESSOR TAKE 1 TABLET BY MOUTH TWICE A DAY   multivitamin with minerals Tabs tablet Take 1 tablet by mouth daily.   pantoprazole 40 MG tablet Commonly known as:  PROTONIX TAKE 1 TABLET BY MOUTH DAILY   tamsulosin 0.4 MG Caps capsule Commonly known as:  FLOMAX TAKE 1 CAPSULE (0.4 MG TOTAL) BY MOUTH DAILY.   VITAMIN C PO Take 1 tablet by mouth daily. Reported on 06/20/2015   Xalatan 0.005 % ophthalmic solution Generic drug:  latanoprost Place 1 drop into both eyes at bedtime.        DISCHARGE INSTRUCTIONS:   Follow-up PMD 5 days  If you experience worsening of your admission symptoms, develop shortness of breath, life threatening emergency, suicidal or homicidal thoughts you must seek medical attention immediately by calling 911 or calling your MD immediately  if symptoms less severe.  You Must read complete instructions/literature along with all the possible adverse reactions/side effects for all the Medicines you take and that have been prescribed to you. Take any new Medicines after you have completely understood and accept all the possible adverse reactions/side effects.   Please note  You were cared for by a hospitalist during  your hospital stay. If you have any questions about your discharge medications or the care you received while you were in the hospital after you are discharged, you can call the unit and asked to speak with the hospitalist on call if the hospitalist that took care of you is not available. Once you are discharged, your primary care physician will handle any further medical issues. Please note that NO REFILLS for any discharge medications will be authorized once you are discharged, as it is imperative that you return to your primary care physician (or establish a relationship with a primary care physician if you do not have one) for your aftercare needs so that they can reassess your need for medications and monitor your lab values.    Today   CHIEF COMPLAINT:   Chief Complaint  Patient presents with  . Cough  . Fever    HISTORY  OF PRESENT ILLNESS:  Tyler Stout  is a 85 y.o. male presented with fever and cough and found to have pneumonia   VITAL SIGNS:  Blood pressure 129/76, pulse (!) 101, temperature 98.4 F (36.9 C), temperature source Oral, resp. rate 18, height 5\' 6"  (1.676 m), weight 70.8 kg, SpO2 91 %.   PHYSICAL EXAMINATION:  GENERAL:  85 y.o.-year-old patient lying in the bed with no acute distress.  EYES: Pupils equal, round, reactive to light and accommodation. No scleral icterus. Extraocular muscles intact.  HEENT: Head atraumatic, normocephalic. Oropharynx and nasopharynx clear.  NECK:  Supple, no jugular venous distention. No thyroid enlargement, no tenderness.  LUNGS: Decreased breath sounds bilateral bases, no wheezing, rales,rhonchi or crepitation. No use of accessory muscles of respiration.  CARDIOVASCULAR: S1, S2 normal. No murmurs, rubs, or gallops.  ABDOMEN: Soft, non-tender, non-distended. Bowel sounds present. No organomegaly or mass.  EXTREMITIES: No pedal edema, cyanosis, or clubbing.  NEUROLOGIC: Cranial nerves II through XII are intact. Muscle strength 5/5  in all extremities. Sensation intact. Gait not checked.  PSYCHIATRIC: The patient is alert and oriented x 3.  SKIN: No obvious rash, lesion, or ulcer.   DATA REVIEW:   CBC Recent Labs  Lab 05/08/18 0608  WBC 11.4*  HGB 10.6*  HCT 31.7*  PLT 296    Chemistries  Recent Labs  Lab 05/07/18 1206  05/08/18 0608  NA 134*  --  135  K 3.8  --  3.5  CL 101  --  105  CO2 22  --  22  GLUCOSE 103*  --  106*  BUN 17  --  13  CREATININE 0.77   < > 0.61  CALCIUM 8.5*  --  8.3*  AST 25  --   --   ALT 5  --   --   ALKPHOS 75  --   --   BILITOT 1.2  --   --    < > = values in this interval not displayed.    Cardiac Enzymes Recent Labs  Lab 05/07/18 1206  TROPONINI <0.03    Microbiology Results  Results for orders placed or performed during the hospital encounter of 05/07/18  SARS Coronavirus 2 [facility] order, Performed in [place] hospital lab)     Status: None   Collection Time: 05/07/18  1:05 PM  Result Value Ref Range Status   SARS Coronavirus 2 NEGATIVE NEGATIVE Final    Comment: (NOTE) If result is NEGATIVE SARS-CoV-2 target nucleic acids are NOT DETECTED. The SARS-CoV-2 RNA is generally detectable in upper and lower  respiratory specimens during the acute phase of infection. The lowest  concentration of SARS-CoV-2 viral copies this assay can detect is 250  copies / mL. A negative result does not preclude SARS-CoV-2 infection  and should not be used as the sole basis for treatment or other  patient management decisions.  A negative result may occur with  improper specimen collection / handling, submission of specimen other  than nasopharyngeal swab, presence of viral mutation(s) within the  areas targeted by this assay, and inadequate number of viral copies  (<250 copies / mL). A negative result must be combined with clinical  observations, patient history, and epidemiological information. If result is POSITIVE SARS-CoV-2 target nucleic acids are DETECTED. The  SARS-CoV-2 RNA is generally detectable in upper and lower  respiratory specimens dur ing the acute phase of infection.  Positive  results are indicative of active infection with SARS-CoV-2.  Clinical  correlation with patient  history and other diagnostic information is  necessary to determine patient infection status.  Positive results do  not rule out bacterial infection or co-infection with other viruses. If result is PRESUMPTIVE POSTIVE SARS-CoV-2 nucleic acids MAY BE PRESENT.   A presumptive positive result was obtained on the submitted specimen  and confirmed on repeat testing.  While 2019 novel coronavirus  (SARS-CoV-2) nucleic acids may be present in the submitted sample  additional confirmatory testing may be necessary for epidemiological  and / or clinical management purposes  to differentiate between  SARS-CoV-2 and other Sarbecovirus currently known to infect humans.  If clinically indicated additional testing with an alternate test  methodology [phone]) is advised. The SARS-CoV-2 RNA is generally  detectable in upper and lower respiratory sp ecimens during the acute  phase of infection. The expected result is Negative. Fact Sheet for Patients:  StrictlyIdeas.no Fact Sheet for Healthcare Providers: BankingDealers.co.za This test is not yet approved or cleared by the [place] FDA and has been authorized for detection and/or diagnosis of SARS-CoV-2 by FDA under an Emergency Use Authorization (EUA).  This EUA will remain in effect (meaning this test can be used) for the duration of the COVID-19 declaration under Section 564(b)(1) of the Act, 21 U.S.C. section 360bbb-3(b)(1), unless the authorization is terminated or revoked sooner. Performed at [facility], [address] [place], [place] [address]   Culture, blood (routine x 2) Call MD if unable to obtain prior to antibiotics being given     Status: None  (Preliminary result)   Collection Time: 05/07/18  6:42 PM  Result Value Ref Range Status   Specimen Description BLOOD BLOOD LEFT ARM  Final   Special Requests   Final    BOTTLES DRAWN AEROBIC AND ANAEROBIC Blood Culture results may not be optimal due to an excessive volume of blood received in culture bottles   Culture   Final    NO GROWTH 2 DAYS Performed at [facility], [address], [place], [place] [address]    Report Status PENDING  Incomplete  Culture, blood (routine x 2) Call MD if unable to obtain prior to antibiotics being given     Status: None (Preliminary result)   Collection Time: 05/07/18  6:52 PM  Result Value Ref Range Status   Specimen Description BLOOD BLOOD RIGHT HAND  Final   Special Requests   Final    BOTTLES DRAWN AEROBIC AND ANAEROBIC Blood Culture adequate volume   Culture   Final    NO GROWTH 2 DAYS Performed at [facility], [address] [place], [place] [address]    Report Status PENDING  Incomplete      Management plans discussed with the patient, family and they are in agreement.  CODE STATUS:  Code Status History    Date Active Date Inactive Code Status Order ID Comments User Context   05/07/2018 1550 05/09/2018 1447 Full Code [id number]  Mayer Camel, NP ED   06/09/2015 0206 06/14/2015 1948 Full Code [id number]  [place] Nickel, MD Inpatient   06/08/2015 2147 06/09/2015 0206 Full Code [id number]  Fritzi Mandes, MD Inpatient   03/06/2015 2117 03/07/2015 1714 Full Code [id number]  Bettey Costa, MD Inpatient      TOTAL TIME TAKING CARE OF THIS PATIENT: 35 minutes.    Loletha Grayer M.D on 05/09/2018 at 3:38 PM  Between 7am to 6pm - Pager - [phone]  After 6pm go to [email or web address] - Clinical cytogeneticist  [phone]  CC: Primary care physician; [name], MD

## 2018-05-10 ENCOUNTER — Telehealth: Payer: Self-pay | Admitting: Internal Medicine

## 2018-05-10 ENCOUNTER — Other Ambulatory Visit: Payer: Self-pay | Admitting: Internal Medicine

## 2018-05-10 ENCOUNTER — Ambulatory Visit (INDEPENDENT_AMBULATORY_CARE_PROVIDER_SITE_OTHER): Payer: Medicare HMO | Admitting: Internal Medicine

## 2018-05-10 ENCOUNTER — Other Ambulatory Visit: Payer: Self-pay

## 2018-05-10 DIAGNOSIS — R0602 Shortness of breath: Secondary | ICD-10-CM

## 2018-05-10 DIAGNOSIS — R739 Hyperglycemia, unspecified: Secondary | ICD-10-CM | POA: Diagnosis not present

## 2018-05-10 DIAGNOSIS — J189 Pneumonia, unspecified organism: Secondary | ICD-10-CM

## 2018-05-10 DIAGNOSIS — I2510 Atherosclerotic heart disease of native coronary artery without angina pectoris: Secondary | ICD-10-CM

## 2018-05-10 DIAGNOSIS — E7800 Pure hypercholesterolemia, unspecified: Secondary | ICD-10-CM

## 2018-05-10 DIAGNOSIS — I4891 Unspecified atrial fibrillation: Secondary | ICD-10-CM

## 2018-05-10 DIAGNOSIS — D508 Other iron deficiency anemias: Secondary | ICD-10-CM | POA: Diagnosis not present

## 2018-05-10 DIAGNOSIS — I10 Essential (primary) hypertension: Secondary | ICD-10-CM | POA: Diagnosis not present

## 2018-05-10 DIAGNOSIS — G231 Progressive supranuclear ophthalmoplegia [Steele-Richardson-Olszewski]: Secondary | ICD-10-CM | POA: Diagnosis not present

## 2018-05-10 NOTE — Progress Notes (Signed)
[name]: Tyler Stout, male   DOB: Apr 18, 1925, 85 y.o.   MRN: [id number] Virtual Visit via Video Note  This visit type was conducted due to national recommendations for restrictions regarding the COVID-19 pandemic (e.g. social distancing).  This format is felt to be most appropriate for this patient at this time.  All issues noted in this document were discussed and addressed.  No physical exam was performed (except for noted visual exam findings with Video Visits).   I connected with Tyler Stout on 05/10/18 at 12:00 PM EDT by a video enabled telemedicine application and verified that I am speaking with the correct person using two identifiers. Location patient: home Location provider: work Persons participating in the virtual visit: patient, provider, pts wife Tyler Stout) and pts daughter Tyler Stout).    I discussed the limitations, risks, security and privacy concerns of performing an evaluation and management service by video and the availability of in person appointments.  The patient expressed understanding and agreed to proceed.   Reason for visit: hospital follow up.   HPI: He was admitted 05/07/18 - 05/09/18 and diagnosed with community acquired pneumonia.  Found to have bilateral pneumonia with leukocytosis.  Was given rocephin and zithromax during his hospitalization.  COVID 10 test was negative.  Clinically improved.  Discharged yesterday on omnicef and zithromax.  White count improved.  Wife states around 3:00 this am, he woke up.  Was sitting on the bed "gasping" for air.  Eased off on its own.  States back hurt some.  She massaged his back.  He relaxed.  Has been up walking some today - going to the bathroom.  Denies any sob now.  Feels better.  Still some cough.  Overall improved.  Is eating.  Laughing and making jokes intermittent through the visit.  No fever.  No acid reflux.  No abdominal pain. No vomiting or diarrhea.  Overall he feels he is doing well.  Discussed home health - to  assess for need for oxygen.     ROS: See pertinent positives and negatives per HPI.  Past Medical History:  Diagnosis Date  . Anemia    iron deficiency  . Arthritis   . Atrial fibrillation ([place])   . Bilateral renal cysts    worked up by Dr Bernardo Heater  . BPH (benign prostatic hypertrophy)   . CAD (coronary artery disease)    s/p bypass graft x 4 (1991)  . Diverticulosis   . GERD (gastroesophageal reflux disease)   . Glaucoma   . Hypercholesterolemia   . Hypertension   . Pancreatitis    x2 (1994 and 1998), presumed to be gallstone pancreatitis.  s/p ERCP and sphincterotomy    Past Surgical History:  Procedure Laterality Date  . APPENDECTOMY    . CHOLECYSTECTOMY  1994  . CORONARY ARTERY BYPASS GRAFT     x 4,  1991  . INGUINAL HERNIA REPAIR  2009  . open heart surgery  1991    Family History  Problem Relation Age of Onset  . Throat cancer Father        oropharyngeal cancer  . Kidney disease Sister   . Heart disease Other   . Colon cancer Neg Hx   . Prostate cancer Neg Hx     SOCIAL HX: reviewed.    Current Outpatient Medications:  .  amLODipine (NORVASC) 5 MG tablet, TAKE 1 TABLET (5 MG TOTAL) BY MOUTH DAILY., Disp: 90 tablet, Rfl: 1 .  Ascorbic Acid (VITAMIN C PO), Take 1  tablet by mouth daily. Reported on 06/20/2015, Disp: , Rfl:  .  aspirin EC 81 MG tablet, Take 81 mg by mouth daily., Disp: , Rfl:  .  atorvastatin (LIPITOR) 20 MG tablet, TAKE 1 TABLET (20 MG TOTAL) BY MOUTH DAILY., Disp: 90 tablet, Rfl: 3 .  azelastine (ASTELIN) 0.1 % nasal spray, PLACE 1 SPRAY INTO BOTH NOSTRILS 2 (TWO) TIMES DAILY. USE IN EACH NOSTRIL AS DIRECTED, Disp: 16 mL, Rfl: 1 .  carbidopa-levodopa (SINEMET IR) 25-100 MG tablet, TAKE 2 TABLETS BY MOUTH 3 TIMES DAILY, Disp: , Rfl: 2 .  citalopram (CELEXA) 20 MG tablet, Take 0.5 tablets by mouth daily., Disp: , Rfl:  .  fluticasone (FLONASE) 50 MCG/ACT nasal spray, SPRAY 2 SPRAYS INTO EACH NOSTRIL EVERY DAY, Disp: 16 g, Rfl: 1 .  furosemide  (LASIX) 20 MG tablet, Take 20 mg by mouth daily as needed., Disp: , Rfl:  .  guaiFENesin-dextromethorphan (ROBITUSSIN DM) 100-10 MG/5ML syrup, Take 5 mLs by mouth every 8 (eight) hours as needed for cough (chest congestion)., Disp: 118 mL, Rfl: 0 .  latanoprost (XALATAN) 0.005 % ophthalmic solution, Place 1 drop into both eyes at bedtime. , Disp: , Rfl:  .  MAGNESIUM PO, Take 1 tablet by mouth daily. Reported on 06/20/2015, Disp: , Rfl:  .  metoprolol tartrate (LOPRESSOR) 25 MG tablet, TAKE 1 TABLET BY MOUTH TWICE A DAY, Disp: , Rfl:  .  Multiple Vitamin (MULTIVITAMIN WITH MINERALS) TABS tablet, Take 1 tablet by mouth daily., Disp: , Rfl:  .  pantoprazole (PROTONIX) 40 MG tablet, TAKE 1 TABLET BY MOUTH DAILY, Disp: 90 tablet, Rfl: 3 .  tamsulosin (FLOMAX) 0.4 MG CAPS capsule, TAKE 1 CAPSULE (0.4 MG TOTAL) BY MOUTH DAILY., Disp: 90 capsule, Rfl: 1  EXAM:  VITALS per patient if applicable: pulse ox sitting 91%  GENERAL: alert, oriented, appears well and in no acute distress. Laughing and making jokes intermittently throughout the exam.   HEENT: atraumatic, conjunttiva clear, no obvious abnormalities on inspection of external nose and ears  NECK: normal movements of the head and neck  LUNGS: on inspection no signs of respiratory distress, breathing rate appears normal, no obvious gross SOB, gasping or wheezing. Able to take a good deep breath without coughing.  No increased cough with forced expiration.    CV: no obvious cyanosis  PSYCH/NEURO: pleasant and cooperative, no obvious depression or anxiety, speech and thought processing grossly intact  ASSESSMENT AND PLAN:  Discussed the following assessment and plan:  Other iron deficiency anemia  Atrial fibrillation, unspecified type ([place])  Coronary artery disease involving native coronary artery of native heart without angina pectoris  Essential hypertension  Hypercholesteremia  Hyperglycemia  Progressive supranuclear palsy  ([facility])  Pneumonia due to infectious organism, unspecified laterality, unspecified part of lung - Plan: DG Chest 2 View  Anemia Has had GI w/up.  hgb was doing better.  Recent hgb (in hospital) - 10.9.  Will follow.  No active bleeding per pt.    Atrial fibrillation Has been followed by cardiology.  Off coumadin secondary to GI bleed.  Overall stable.   CAD (coronary artery disease) No chest pain.  Stable.  Continue f/u with cardiology.    Essential hypertension Blood pressure has been doing well.  Follow pressures.  Follow metabolic panel.   Hypercholesteremia On lipitor.  Low cholesterol diet and exercise.  Follow lipid panel and liver function tests.    Hyperglycemia Follow met b and a1c.   Progressive supranuclear palsy ([place]) Followed by  neurology.  Stable.  Last evaluated 11/2017.  Recommended f/u 6 months.    PNA (pneumonia) Recently hospitalized and treated as outlined.  Complete course of azithromycin and omnicef.  Doing better.  Had the episode this am with increased sob.  Has been ok since.  No increased sob now.  Complete abx.  Arrange home health.  Will see if qualifies for home oxygen.  Follow closely.  Call with update.  Will need f/u cxr in the next 6 weeks.      I discussed the assessment and treatment plan with the patient. The patient was provided an opportunity to ask questions and all were answered. The patient agreed with the plan and demonstrated an understanding of the instructions.   The patient was advised to call back or seek an in-person evaluation if the symptoms worsen or if the condition fails to improve as anticipated.    [name], MD

## 2018-05-10 NOTE — Progress Notes (Signed)
Order placed for home health referral.

## 2018-05-10 NOTE — Discharge Summary (Signed)
[place] at [facility] Discharge summary  PATIENT NAME: Tyler Stout    MR#:  [id number]  DATE OF BIRTH:  02-18-1925  DATE OF ADMISSION:  05/07/2018 ADMITTING PHYSICIAN: Hillary Bow, MD  DATE OF DISCHARGE: 05/09/2018 11:25 AM  PRIMARY CARE PHYSICIAN: [name], MD    ADMISSION DIAGNOSIS:  Community acquired pneumonia, unspecified laterality [J18.9]  DISCHARGE DIAGNOSIS:  Active Problems:   PNA (pneumonia)   SECONDARY DIAGNOSIS:   Past Medical History:  Diagnosis Date  . Anemia    iron deficiency  . Arthritis   . Atrial fibrillation ([place])   . Bilateral renal cysts    worked up by Dr Bernardo Heater  . BPH (benign prostatic hypertrophy)   . CAD (coronary artery disease)    s/p bypass graft x 4 (1991)  . Diverticulosis   . GERD (gastroesophageal reflux disease)   . Glaucoma   . Hypercholesterolemia   . Hypertension   . Pancreatitis    x2 (1994 and 1998), presumed to be gallstone pancreatitis.  s/p ERCP and sphincterotomy    HOSPITAL COURSE:  Pneumonia bilaterally leukocytosis.  The patient was given Rocephin and Zithromax during the hospital course.  COVID-19 testing was negative.  The patient felt better and wanted to go home.  He received 3 doses of Rocephin and Zithromax during the hospital course.  I prescribed 2 more doses of Zithromax and change Rocephin over to [facility].  The patient remained afebrile and white blood cell count trended better.  Hypertension.  The patient is on Norvasc and metoprolol  Hyperlipidemia unspecified on atorvastatin  GERD on PPI  Glaucoma unspecified on latanoprost  BPH on Flomax  DISCHARGE CONDITIONS:   Satisfactory  CONSULTS OBTAINED:  None  DRUG ALLERGIES:   Allergies  Allergen Reactions  . Ilosone [Erythromycin] Other (See Comments)    Reaction:  GI upset   . Penicillins Rash and Other (See Comments)    Has patient had a PCN reaction causing immediate rash, facial/tongue/throat  swelling, SOB or lightheadedness with hypotension: No Has patient had a PCN reaction causing severe rash involving mucus membranes or skin necrosis: No Has patient had a PCN reaction that required hospitalization No Has patient had a PCN reaction occurring within the last 10 years: No If all of the above answers are "NO", then may proceed with Cephalosporin use.    DISCHARGE MEDICATIONS:   Allergies as of 05/09/2018      Reactions   Ilosone [erythromycin] Other (See Comments)   Reaction:  GI upset    Penicillins Rash, Other (See Comments)   Has patient had a PCN reaction causing immediate rash, facial/tongue/throat swelling, SOB or lightheadedness with hypotension: No Has patient had a PCN reaction causing severe rash involving mucus membranes or skin necrosis: No Has patient had a PCN reaction that required hospitalization No Has patient had a PCN reaction occurring within the last 10 years: No If all of the above answers are "NO", then may proceed with Cephalosporin use.      Medication List    TAKE these medications   amLODipine 5 MG tablet Commonly known as:  NORVASC TAKE 1 TABLET (5 MG TOTAL) BY MOUTH DAILY.   aspirin EC 81 MG tablet Take 81 mg by mouth daily.   atorvastatin 20 MG tablet Commonly known as:  LIPITOR TAKE 1 TABLET (20 MG TOTAL) BY MOUTH DAILY.   azelastine 0.1 % nasal spray Commonly known as:  ASTELIN PLACE 1 SPRAY INTO BOTH NOSTRILS 2 (TWO) TIMES DAILY.  USE IN EACH NOSTRIL AS DIRECTED   azithromycin 250 MG tablet Commonly known as:  ZITHROMAX One tab po daily for two days starting 05/10/2018   carbidopa-levodopa 25-100 MG tablet Commonly known as:  SINEMET IR TAKE 2 TABLETS BY MOUTH 3 TIMES DAILY   cefdinir 300 MG capsule Commonly known as:  OMNICEF Take 1 capsule (300 mg total) by mouth 2 (two) times daily for 4 days.   citalopram 20 MG tablet Commonly known as:  CELEXA Take 0.5 tablets by mouth daily.   fluticasone 50 MCG/ACT nasal  spray Commonly known as:  FLONASE SPRAY 2 SPRAYS INTO EACH NOSTRIL EVERY DAY   furosemide 20 MG tablet Commonly known as:  LASIX Take 20 mg by mouth daily as needed.   guaiFENesin-dextromethorphan 100-10 MG/5ML syrup Commonly known as:  ROBITUSSIN DM Take 5 mLs by mouth every 8 (eight) hours as needed for cough (chest congestion).   MAGNESIUM PO Take 1 tablet by mouth daily. Reported on 06/20/2015   metoprolol tartrate 25 MG tablet Commonly known as:  LOPRESSOR TAKE 1 TABLET BY MOUTH TWICE A DAY   multivitamin with minerals Tabs tablet Take 1 tablet by mouth daily.   pantoprazole 40 MG tablet Commonly known as:  PROTONIX TAKE 1 TABLET BY MOUTH DAILY   tamsulosin 0.4 MG Caps capsule Commonly known as:  FLOMAX TAKE 1 CAPSULE (0.4 MG TOTAL) BY MOUTH DAILY.   VITAMIN C PO Take 1 tablet by mouth daily. Reported on 06/20/2015   Xalatan 0.005 % ophthalmic solution Generic drug:  latanoprost Place 1 drop into both eyes at bedtime.     ASK your doctor about these medications   fluticasone furoate-vilanterol 200-25 MCG/INH Aepb Commonly known as:  Breo Ellipta Inhale 1 puff into the lungs daily. Ask about: Should I take this medication?        DISCHARGE INSTRUCTIONS:   Follow-up PMD 5 days  If you experience worsening of your admission symptoms, develop shortness of breath, life threatening emergency, suicidal or homicidal thoughts you must seek medical attention immediately by calling 911 or calling your MD immediately  if symptoms less severe.  You Must read complete instructions/literature along with all the possible adverse reactions/side effects for all the Medicines you take and that have been prescribed to you. Take any new Medicines after you have completely understood and accept all the possible adverse reactions/side effects.   Please note  You were cared for by a hospitalist during your hospital stay. If you have any questions about your discharge medications or  the care you received while you were in the hospital after you are discharged, you can call the unit and asked to speak with the hospitalist on call if the hospitalist that took care of you is not available. Once you are discharged, your primary care physician will handle any further medical issues. Please note that NO REFILLS for any discharge medications will be authorized once you are discharged, as it is imperative that you return to your primary care physician (or establish a relationship with a primary care physician if you do not have one) for your aftercare needs so that they can reassess your need for medications and monitor your lab values.    Today   CHIEF COMPLAINT:   Chief Complaint  Patient presents with  . Cough  . Fever    HISTORY OF PRESENT ILLNESS:  Tyler Stout  is a 85 y.o. male presented with cough and fever and found to have pneumonia   VITAL  SIGNS:  Blood pressure 129/76, pulse (!) 101, temperature 98.4 F (36.9 C), temperature source Oral, resp. rate 18, height 5\' 6"  (1.676 m), weight 70.8 kg, SpO2 91 %.   PHYSICAL EXAMINATION:  GENERAL:  85 y.o.-year-old patient lying in the bed with no acute distress.  EYES: Pupils equal, round, reactive to light and accommodation. No scleral icterus. Extraocular muscles intact.  HEENT: Head atraumatic, normocephalic. Oropharynx and nasopharynx clear.  NECK:  Supple, no jugular venous distention. No thyroid enlargement, no tenderness.  LUNGS: Decreased breath sounds bilateral bases, no wheezing, rales,rhonchi or crepitation. No use of accessory muscles of respiration.  CARDIOVASCULAR: S1, S2 normal. No murmurs, rubs, or gallops.  ABDOMEN: Soft, non-tender, non-distended. Bowel sounds present. No organomegaly or mass.  EXTREMITIES: No pedal edema, cyanosis, or clubbing.  NEUROLOGIC: Cranial nerves II through XII are intact. Muscle strength 5/5 in all extremities. Sensation intact. Gait not checked.  PSYCHIATRIC: The patient  is alert and oriented x 3.  SKIN: No obvious rash, lesion, or ulcer.   DATA REVIEW:   CBC Recent Labs  Lab 05/08/18 0608  WBC 11.4*  HGB 10.6*  HCT 31.7*  PLT 296    Chemistries  Recent Labs  Lab 05/07/18 1206  05/08/18 0608  NA 134*  --  135  K 3.8  --  3.5  CL 101  --  105  CO2 22  --  22  GLUCOSE 103*  --  106*  BUN 17  --  13  CREATININE 0.77   < > 0.61  CALCIUM 8.5*  --  8.3*  AST 25  --   --   ALT 5  --   --   ALKPHOS 75  --   --   BILITOT 1.2  --   --    < > = values in this interval not displayed.    Cardiac Enzymes Recent Labs  Lab 05/07/18 1206  TROPONINI <0.03    Microbiology Results  Results for orders placed or performed during the hospital encounter of 05/07/18  SARS Coronavirus 2 [facility] order, Performed in [facility] hospital lab)     Status: None   Collection Time: 05/07/18  1:05 PM  Result Value Ref Range Status   SARS Coronavirus 2 NEGATIVE NEGATIVE Final    Comment: (NOTE) If result is NEGATIVE SARS-CoV-2 target nucleic acids are NOT DETECTED. The SARS-CoV-2 RNA is generally detectable in upper and lower  respiratory specimens during the acute phase of infection. The lowest  concentration of SARS-CoV-2 viral copies this assay can detect is 250  copies / mL. A negative result does not preclude SARS-CoV-2 infection  and should not be used as the sole basis for treatment or other  patient management decisions.  A negative result may occur with  improper specimen collection / handling, submission of specimen other  than nasopharyngeal swab, presence of viral mutation(s) within the  areas targeted by this assay, and inadequate number of viral copies  (<250 copies / mL). A negative result must be combined with clinical  observations, patient history, and epidemiological information. If result is POSITIVE SARS-CoV-2 target nucleic acids are DETECTED. The SARS-CoV-2 RNA is generally detectable in upper and lower  respiratory specimens  dur ing the acute phase of infection.  Positive  results are indicative of active infection with SARS-CoV-2.  Clinical  correlation with patient history and other diagnostic information is  necessary to determine patient infection status.  Positive results do  not rule out bacterial infection or co-infection  with other viruses. If result is PRESUMPTIVE POSTIVE SARS-CoV-2 nucleic acids MAY BE PRESENT.   A presumptive positive result was obtained on the submitted specimen  and confirmed on repeat testing.  While 2019 novel coronavirus  (SARS-CoV-2) nucleic acids may be present in the submitted sample  additional confirmatory testing may be necessary for epidemiological  and / or clinical management purposes  to differentiate between  SARS-CoV-2 and other Sarbecovirus currently known to infect humans.  If clinically indicated additional testing with an alternate test  methodology [phone]) is advised. The SARS-CoV-2 RNA is generally  detectable in upper and lower respiratory sp ecimens during the acute  phase of infection. The expected result is Negative. Fact Sheet for Patients:  StrictlyIdeas.no Fact Sheet for Healthcare Providers: BankingDealers.co.za This test is not yet approved or cleared by the [place] FDA and has been authorized for detection and/or diagnosis of SARS-CoV-2 by FDA under an Emergency Use Authorization (EUA).  This EUA will remain in effect (meaning this test can be used) for the duration of the COVID-19 declaration under Section 564(b)(1) of the Act, 21 U.S.C. section 360bbb-3(b)(1), unless the authorization is terminated or revoked sooner. Performed at [facility], [address] [place], [place] [address]   Culture, blood (routine x 2) Call MD if unable to obtain prior to antibiotics being given     Status: None (Preliminary result)   Collection Time: 05/07/18  6:42 PM  Result Value Ref Range  Status   Specimen Description BLOOD BLOOD LEFT ARM  Final   Special Requests   Final    BOTTLES DRAWN AEROBIC AND ANAEROBIC Blood Culture results may not be optimal due to an excessive volume of blood received in culture bottles   Culture   Final    NO GROWTH 3 DAYS Performed at [facility], [address], [place], [place] [address]    Report Status PENDING  Incomplete  Culture, blood (routine x 2) Call MD if unable to obtain prior to antibiotics being given     Status: None (Preliminary result)   Collection Time: 05/07/18  6:52 PM  Result Value Ref Range Status   Specimen Description BLOOD BLOOD RIGHT HAND  Final   Special Requests   Final    BOTTLES DRAWN AEROBIC AND ANAEROBIC Blood Culture adequate volume   Culture   Final    NO GROWTH 3 DAYS Performed at [facility], [address] [place], [place] [address]    Report Status PENDING  Incomplete     Management plans discussed with the patient, family and they are in agreement.  CODE STATUS:  Code Status History    Date Active Date Inactive Code Status Order ID Comments User Context   05/07/2018 1550 05/09/2018 1447 Full Code [id number]  Mayer Camel, NP ED   06/09/2015 0206 06/14/2015 1948 Full Code [id number]  [place] Nickel, MD Inpatient   06/08/2015 2147 06/09/2015 0206 Full Code [id number]  Fritzi Mandes, MD Inpatient   03/06/2015 2117 03/07/2015 1714 Full Code [id number]  Bettey Costa, MD Inpatient      TOTAL TIME TAKING CARE OF THIS PATIENT: 35 minutes.    Loletha Grayer M.D on 05/10/2018 at 9:32 AM  Between 7am to 6pm - Pager - [phone]  After 6pm go to [email or web address] - password [email or web address]  [facility] Office  [phone]  CC: Primary care physician; [name], MD

## 2018-05-10 NOTE — Telephone Encounter (Signed)
Transition Care Management Follow-up Telephone Call  How have you been since you were released from the hospital?Spoke with Patient wife said patient was really hard time breathing this morning. Used hs Breo and this seem to help , patient has been resting since.   Do you understand why you were in the hospital? yes   Do you understand the discharge instrcutions? yes  Items Reviewed:  Medications reviewed: yes  Allergies reviewed: yes  Dietary changes reviewed: yes  Referrals reviewed: yes   Functional Questionnaire:   Activities of Daily Living (ADLs):   He states they are independent in the following: feeding, continence, grooming and toileting States they require assistance with the following: ambulation, bathing and hygiene and dressing   Any transportation issues/concerns?: no   Any patient concerns? Yes, continue to be short of breath at times especially in the morning upon rising.   Confirmed importance and date/time of follow-up visits scheduled: yes   Confirmed with patient if condition begins to worsen call PCP or go to the ER.  Patient was given the Call-a-Nurse line [phone]: yes

## 2018-05-10 NOTE — Telephone Encounter (Signed)
Pt was evaluated today at 12:00.

## 2018-05-11 DIAGNOSIS — J189 Pneumonia, unspecified organism: Secondary | ICD-10-CM | POA: Diagnosis not present

## 2018-05-11 DIAGNOSIS — R531 Weakness: Secondary | ICD-10-CM | POA: Diagnosis not present

## 2018-05-11 DIAGNOSIS — R2689 Other abnormalities of gait and mobility: Secondary | ICD-10-CM | POA: Diagnosis not present

## 2018-05-12 DIAGNOSIS — J189 Pneumonia, unspecified organism: Secondary | ICD-10-CM | POA: Diagnosis not present

## 2018-05-12 DIAGNOSIS — R531 Weakness: Secondary | ICD-10-CM | POA: Diagnosis not present

## 2018-05-12 DIAGNOSIS — R2689 Other abnormalities of gait and mobility: Secondary | ICD-10-CM | POA: Diagnosis not present

## 2018-05-12 LAB — CULTURE, BLOOD (ROUTINE X 2)
Culture: NO GROWTH
Culture: NO GROWTH
Special Requests: ADEQUATE

## 2018-05-13 ENCOUNTER — Other Ambulatory Visit: Payer: Self-pay | Admitting: Internal Medicine

## 2018-05-14 DIAGNOSIS — R531 Weakness: Secondary | ICD-10-CM | POA: Diagnosis not present

## 2018-05-14 DIAGNOSIS — R2689 Other abnormalities of gait and mobility: Secondary | ICD-10-CM | POA: Diagnosis not present

## 2018-05-14 DIAGNOSIS — J189 Pneumonia, unspecified organism: Secondary | ICD-10-CM | POA: Diagnosis not present

## 2018-05-15 ENCOUNTER — Encounter: Payer: Self-pay | Admitting: Internal Medicine

## 2018-05-15 NOTE — Assessment & Plan Note (Signed)
Followed by neurology.  Stable.  Last evaluated 11/2017.  Recommended f/u 6 months.

## 2018-05-15 NOTE — Assessment & Plan Note (Signed)
Blood pressure has been doing well.  Follow pressures.  Follow metabolic panel.   

## 2018-05-15 NOTE — Assessment & Plan Note (Signed)
Has had GI w/up.  hgb was doing better.  Recent hgb (in hospital) - 10.9.  Will follow.  No active bleeding per pt.

## 2018-05-15 NOTE — Assessment & Plan Note (Signed)
Recently hospitalized and treated as outlined.  Complete course of azithromycin and omnicef.  Doing better.  Had the episode this am with increased sob.  Has been ok since.  No increased sob now.  Complete abx.  Arrange home health.  Will see if qualifies for home oxygen.  Follow closely.  Call with update.  Will need f/u cxr in the next 6 weeks.

## 2018-05-15 NOTE — Assessment & Plan Note (Signed)
Has been followed by cardiology.  Off coumadin secondary to GI bleed.  Overall stable.

## 2018-05-15 NOTE — Assessment & Plan Note (Signed)
Follow met b and a1c.  

## 2018-05-15 NOTE — Assessment & Plan Note (Signed)
No chest pain.  Stable.  Continue f/u with cardiology.

## 2018-05-15 NOTE — Assessment & Plan Note (Signed)
On lipitor.  Low cholesterol diet and exercise.  Follow lipid panel and liver function tests.   

## 2018-05-17 ENCOUNTER — Telehealth: Payer: Self-pay | Admitting: Internal Medicine

## 2018-05-17 NOTE — Telephone Encounter (Signed)
-----   Message from Nanci Pina, RN sent at 05/17/2018 11:01 AM EDT ----- Regarding: RE: f/u Talked to [facility] this morning the lowest Patient 02 sats ambulating was 91% after 3 minutes, So he would not qualify under medicare guidelines. ----- Message ----- From: [name], MD Sent: 05/15/2018   4:40 PM EDT To: Nanci Pina, RN Subject: f/u                                            Just wanted to f/u to see what home health said about testing him to see if he qualified for home oxygen.  Thanks    Dr Nicki Reaper

## 2018-05-18 DIAGNOSIS — R531 Weakness: Secondary | ICD-10-CM | POA: Diagnosis not present

## 2018-05-18 DIAGNOSIS — J189 Pneumonia, unspecified organism: Secondary | ICD-10-CM | POA: Diagnosis not present

## 2018-05-18 DIAGNOSIS — R2689 Other abnormalities of gait and mobility: Secondary | ICD-10-CM | POA: Diagnosis not present

## 2018-05-19 ENCOUNTER — Telehealth: Payer: Self-pay | Admitting: Internal Medicine

## 2018-05-19 NOTE — Telephone Encounter (Signed)
Verbals given to [place]. Nothing further needed with this note

## 2018-05-19 NOTE — Telephone Encounter (Signed)
Copied from [place] [phone]. Topic: Quick Communication - See Telephone Encounter >> May 19, 2018  2:54 PM Burchel, Abbi R wrote: CRM for notification. See Telephone encounter for: 05/19/18.  Lattie Haw ([facility] [phone] *ok to leave VM) requesting v/o for OT 2 x 4wks.

## 2018-05-20 DIAGNOSIS — J189 Pneumonia, unspecified organism: Secondary | ICD-10-CM | POA: Diagnosis not present

## 2018-05-20 DIAGNOSIS — R531 Weakness: Secondary | ICD-10-CM | POA: Diagnosis not present

## 2018-05-20 DIAGNOSIS — R2689 Other abnormalities of gait and mobility: Secondary | ICD-10-CM | POA: Diagnosis not present

## 2018-05-21 DIAGNOSIS — R531 Weakness: Secondary | ICD-10-CM | POA: Diagnosis not present

## 2018-05-21 DIAGNOSIS — J189 Pneumonia, unspecified organism: Secondary | ICD-10-CM | POA: Diagnosis not present

## 2018-05-21 DIAGNOSIS — R2689 Other abnormalities of gait and mobility: Secondary | ICD-10-CM | POA: Diagnosis not present

## 2018-05-24 DIAGNOSIS — J189 Pneumonia, unspecified organism: Secondary | ICD-10-CM | POA: Diagnosis not present

## 2018-05-24 DIAGNOSIS — R2689 Other abnormalities of gait and mobility: Secondary | ICD-10-CM | POA: Diagnosis not present

## 2018-05-24 DIAGNOSIS — R531 Weakness: Secondary | ICD-10-CM | POA: Diagnosis not present

## 2018-05-25 ENCOUNTER — Other Ambulatory Visit: Payer: Self-pay | Admitting: Internal Medicine

## 2018-05-25 DIAGNOSIS — R2689 Other abnormalities of gait and mobility: Secondary | ICD-10-CM | POA: Diagnosis not present

## 2018-05-25 DIAGNOSIS — R531 Weakness: Secondary | ICD-10-CM | POA: Diagnosis not present

## 2018-05-25 DIAGNOSIS — J189 Pneumonia, unspecified organism: Secondary | ICD-10-CM | POA: Diagnosis not present

## 2018-05-26 ENCOUNTER — Other Ambulatory Visit (INDEPENDENT_AMBULATORY_CARE_PROVIDER_SITE_OTHER): Payer: Medicare HMO

## 2018-05-26 ENCOUNTER — Other Ambulatory Visit: Payer: Self-pay

## 2018-05-26 DIAGNOSIS — I10 Essential (primary) hypertension: Secondary | ICD-10-CM | POA: Diagnosis not present

## 2018-05-26 DIAGNOSIS — E7800 Pure hypercholesterolemia, unspecified: Secondary | ICD-10-CM | POA: Diagnosis not present

## 2018-05-26 DIAGNOSIS — R739 Hyperglycemia, unspecified: Secondary | ICD-10-CM | POA: Diagnosis not present

## 2018-05-26 LAB — HEPATIC FUNCTION PANEL
ALT: 10 U/L (ref 0–53)
AST: 21 U/L (ref 0–37)
Albumin: 4.2 g/dL (ref 3.5–5.2)
Alkaline Phosphatase: 91 U/L (ref 39–117)
Bilirubin, Direct: 0.2 mg/dL (ref 0.0–0.3)
Total Bilirubin: 0.8 mg/dL (ref 0.2–1.2)
Total Protein: 6.7 g/dL (ref 6.0–8.3)

## 2018-05-26 LAB — CBC WITH DIFFERENTIAL/PLATELET
Basophils Absolute: 0.1 10*3/uL (ref 0.0–0.1)
Basophils Relative: 0.9 % (ref 0.0–3.0)
Eosinophils Absolute: 0.1 10*3/uL (ref 0.0–0.7)
Eosinophils Relative: 1.4 % (ref 0.0–5.0)
HCT: 36.8 % — ABNORMAL LOW (ref 39.0–52.0)
Hemoglobin: 12.4 g/dL — ABNORMAL LOW (ref 13.0–17.0)
Lymphocytes Relative: 20.1 % (ref 12.0–46.0)
Lymphs Abs: 1.1 10*3/uL (ref 0.7–4.0)
MCHC: 33.8 g/dL (ref 30.0–36.0)
MCV: 94.2 fl (ref 78.0–100.0)
Monocytes Absolute: 0.8 10*3/uL (ref 0.1–1.0)
Monocytes Relative: 15 % — ABNORMAL HIGH (ref 3.0–12.0)
Neutro Abs: 3.4 10*3/uL (ref 1.4–7.7)
Neutrophils Relative %: 62.6 % (ref 43.0–77.0)
Platelets: 238 10*3/uL (ref 150.0–400.0)
RBC: 3.9 Mil/uL — ABNORMAL LOW (ref 4.22–5.81)
RDW: 15.1 % (ref 11.5–15.5)
WBC: 5.5 10*3/uL (ref 4.0–10.5)

## 2018-05-26 LAB — TSH: TSH: 2.95 u[IU]/mL (ref 0.35–4.50)

## 2018-05-26 LAB — LIPID PANEL
Cholesterol: 126 mg/dL (ref 0–200)
HDL: 50.5 mg/dL (ref >39.00)
LDL Cholesterol: 61 mg/dL (ref 0–99)
NonHDL: 75.44
Total CHOL/HDL Ratio: 2
Triglycerides: 71 mg/dL (ref 0.0–149.0)
VLDL: 14.2 mg/dL (ref 0.0–40.0)

## 2018-05-26 LAB — HEMOGLOBIN A1C: Hgb A1c MFr Bld: 6.1 % (ref 4.6–6.5)

## 2018-05-27 DIAGNOSIS — J189 Pneumonia, unspecified organism: Secondary | ICD-10-CM | POA: Diagnosis not present

## 2018-05-27 DIAGNOSIS — R531 Weakness: Secondary | ICD-10-CM | POA: Diagnosis not present

## 2018-05-27 DIAGNOSIS — R2689 Other abnormalities of gait and mobility: Secondary | ICD-10-CM | POA: Diagnosis not present

## 2018-05-28 ENCOUNTER — Other Ambulatory Visit: Payer: Self-pay | Admitting: Internal Medicine

## 2018-05-28 ENCOUNTER — Other Ambulatory Visit: Payer: Self-pay

## 2018-05-28 ENCOUNTER — Encounter: Payer: Self-pay | Admitting: Internal Medicine

## 2018-05-28 ENCOUNTER — Ambulatory Visit (INDEPENDENT_AMBULATORY_CARE_PROVIDER_SITE_OTHER): Payer: Medicare HMO | Admitting: Internal Medicine

## 2018-05-28 DIAGNOSIS — R9389 Abnormal findings on diagnostic imaging of other specified body structures: Secondary | ICD-10-CM

## 2018-05-28 DIAGNOSIS — I10 Essential (primary) hypertension: Secondary | ICD-10-CM | POA: Diagnosis not present

## 2018-05-28 DIAGNOSIS — J189 Pneumonia, unspecified organism: Secondary | ICD-10-CM | POA: Diagnosis not present

## 2018-05-28 DIAGNOSIS — R2689 Other abnormalities of gait and mobility: Secondary | ICD-10-CM | POA: Diagnosis not present

## 2018-05-28 DIAGNOSIS — D508 Other iron deficiency anemias: Secondary | ICD-10-CM | POA: Diagnosis not present

## 2018-05-28 DIAGNOSIS — E7800 Pure hypercholesterolemia, unspecified: Secondary | ICD-10-CM | POA: Diagnosis not present

## 2018-05-28 DIAGNOSIS — R739 Hyperglycemia, unspecified: Secondary | ICD-10-CM

## 2018-05-28 DIAGNOSIS — I4891 Unspecified atrial fibrillation: Secondary | ICD-10-CM

## 2018-05-28 DIAGNOSIS — I2510 Atherosclerotic heart disease of native coronary artery without angina pectoris: Secondary | ICD-10-CM

## 2018-05-28 DIAGNOSIS — I700 Atherosclerosis of aorta: Secondary | ICD-10-CM | POA: Diagnosis not present

## 2018-05-28 DIAGNOSIS — R531 Weakness: Secondary | ICD-10-CM | POA: Diagnosis not present

## 2018-05-28 DIAGNOSIS — M545 Low back pain, unspecified: Secondary | ICD-10-CM

## 2018-05-28 DIAGNOSIS — G231 Progressive supranuclear ophthalmoplegia [Steele-Richardson-Olszewski]: Secondary | ICD-10-CM

## 2018-05-28 NOTE — Progress Notes (Signed)
[name]: Tyler Stout, male   DOB: 1925-04-27, 85 y.o.   MRN: [id number]   Virtual Visit via Telephone Note  This visit type was conducted due to national recommendations for restrictions regarding the COVID-19 pandemic (e.g. social distancing).  This format is felt to be most appropriate for this patient at this time.  All issues noted in this document were discussed and addressed.  No physical exam was performed (except for noted visual exam findings with Video Visits).   I connected with [name] by telephone and verified that I am speaking with the correct person using two identifiers. Location patient: home Location provider: work Persons participating in the telephone visit: patient, provider  I discussed the limitations, risks, security and privacy concerns of performing an evaluation and management service by telephone and the availability of in person appointments. The patient expressed understanding and agreed to proceed.   Reason for visit: scheduled follow up.   HPI: He reports he is doing relatively well.  Breathing is better.  No chest pain.  No acid reflux.  No abdominal pain.  Bowels moving.  Recently treated for pneumonia.  Doing better.  No increased cough or chest congestion.  Does report nasal congestion.  Discussed continuing nasal spray.  Discussed labs.  Hgb has improved.  Some back pain. Tylenol helps.  Does not feel needs anything more at this time.  Discussed the need for f/u cxr.     ROS: See pertinent positives and negatives per HPI.  Past Medical History:  Diagnosis Date  . Anemia    iron deficiency  . Arthritis   . Atrial fibrillation ([place])   . Bilateral renal cysts    worked up by Dr Bernardo Heater  . BPH (benign prostatic hypertrophy)   . CAD (coronary artery disease)    s/p bypass graft x 4 (1991)  . Diverticulosis   . GERD (gastroesophageal reflux disease)   . Glaucoma   . Hypercholesterolemia   . Hypertension   . Pancreatitis    x2 (1994  and 1998), presumed to be gallstone pancreatitis.  s/p ERCP and sphincterotomy    Past Surgical History:  Procedure Laterality Date  . APPENDECTOMY    . CHOLECYSTECTOMY  1994  . CORONARY ARTERY BYPASS GRAFT     x 4,  1991  . INGUINAL HERNIA REPAIR  2009  . open heart surgery  1991    Family History  Problem Relation Age of Onset  . Throat cancer Father        oropharyngeal cancer  . Kidney disease Sister   . Heart disease Other   . Colon cancer Neg Hx   . Prostate cancer Neg Hx     SOCIAL HX: reviewed.    Current Outpatient Medications:  .  amLODipine (NORVASC) 5 MG tablet, TAKE 1 TABLET (5 MG TOTAL) BY MOUTH DAILY., Disp: 90 tablet, Rfl: 1 .  Ascorbic Acid (VITAMIN C PO), Take 1 tablet by mouth daily. Reported on 06/20/2015, Disp: , Rfl:  .  aspirin EC 81 MG tablet, Take 81 mg by mouth daily., Disp: , Rfl:  .  atorvastatin (LIPITOR) 20 MG tablet, TAKE 1 TABLET (20 MG TOTAL) BY MOUTH DAILY., Disp: 90 tablet, Rfl: 3 .  azelastine (ASTELIN) 0.1 % nasal spray, PLACE 1 SPRAY INTO BOTH NOSTRILS 2 (TWO) TIMES DAILY, Disp: 30 mL, Rfl: 1 .  carbidopa-levodopa (SINEMET IR) 25-100 MG tablet, TAKE 2 TABLETS BY MOUTH 3 TIMES DAILY, Disp: , Rfl: 2 .  citalopram (CELEXA) 20  MG tablet, Take 0.5 tablets by mouth daily., Disp: , Rfl:  .  fluticasone (FLONASE) 50 MCG/ACT nasal spray, SPRAY 2 SPRAYS INTO EACH NOSTRIL EVERY DAY, Disp: 48 g, Rfl: 1 .  furosemide (LASIX) 20 MG tablet, Take 20 mg by mouth daily as needed., Disp: , Rfl:  .  guaiFENesin-dextromethorphan (ROBITUSSIN DM) 100-10 MG/5ML syrup, Take 5 mLs by mouth every 8 (eight) hours as needed for cough (chest congestion)., Disp: 118 mL, Rfl: 0 .  latanoprost (XALATAN) 0.005 % ophthalmic solution, Place 1 drop into both eyes at bedtime. , Disp: , Rfl:  .  MAGNESIUM PO, Take 1 tablet by mouth daily. Reported on 06/20/2015, Disp: , Rfl:  .  metoprolol tartrate (LOPRESSOR) 25 MG tablet, TAKE 1 TABLET BY MOUTH TWICE A DAY, Disp: , Rfl:  .   Multiple Vitamin (MULTIVITAMIN WITH MINERALS) TABS tablet, Take 1 tablet by mouth daily., Disp: , Rfl:  .  pantoprazole (PROTONIX) 40 MG tablet, TAKE 1 TABLET BY MOUTH DAILY, Disp: 90 tablet, Rfl: 3 .  tamsulosin (FLOMAX) 0.4 MG CAPS capsule, TAKE 1 CAPSULE (0.4 MG TOTAL) BY MOUTH DAILY., Disp: 90 capsule, Rfl: 1  EXAM:  VITALS per patient if applicable: pulse ox 791% on room air - per pt.    GENERAL: alert. Sounds to be in no acute distress.  Answering questions appropriately.    PSYCH/NEURO: pleasant and cooperative, no obvious depression or anxiety, speech and thought processing grossly intact  ASSESSMENT AND PLAN:  Discussed the following assessment and plan:  Abnormal CXR  Other iron deficiency anemia  Atrial fibrillation, unspecified type (HCC)  Low back pain, unspecified back pain laterality, unspecified chronicity, unspecified whether sciatica present  Coronary artery disease involving native coronary artery of native heart without angina pectoris  Essential hypertension  Hypercholesteremia  Hyperglycemia  Progressive supranuclear palsy (HCC)  Thoracic aortic atherosclerosis ([place])  Abnormal CXR Recent cxr as outlined.  Recently admitted and treated for pneumonia.  Breathing better.  No increased cough/congestion.  Discussed the need for f/u cxr to confirm clears.    Anemia Had GI w/up.  hgb on recent check - better 12.4.  Follow.    Atrial fibrillation Has been followed by cardiology.  Off coumadin secondary to GI bleed.  Stable.    Back pain Stable.   CAD (coronary artery disease) Continue f/u with cardiology.  Stable.    Essential hypertension Blood pressure under good control.  Continue same medication regimen.  Follow pressures.  Follow metabolic panel.    Hypercholesteremia On lipitor.  Low cholesterol diet and exercise.  Follow lipid panel and liver function tests.    Hyperglycemia Low carb diet and exercise.  Follow met b and a1c.    Progressive supranuclear palsy ([place]) Followed by neurology.  Stable.  Has f/u scheduled next month.    Thoracic aortic atherosclerosis (HCC) On lipitor.      I discussed the assessment and treatment plan with the patient. The patient was provided an opportunity to ask questions and all were answered. The patient agreed with the plan and demonstrated an understanding of the instructions.   The patient was advised to call back or seek an in-person evaluation if the symptoms worsen or if the condition fails to improve as anticipated.  I provided 15 minutes of non-face-to-face time during this encounter.   [name], MD

## 2018-05-30 ENCOUNTER — Encounter: Payer: Self-pay | Admitting: Internal Medicine

## 2018-05-30 NOTE — Assessment & Plan Note (Signed)
Has been followed by cardiology.  Off coumadin secondary to GI bleed.  Stable.

## 2018-05-30 NOTE — Assessment & Plan Note (Signed)
Had GI w/up.  hgb on recent check - better 12.4.  Follow.

## 2018-05-30 NOTE — Assessment & Plan Note (Signed)
Followed by neurology.  Stable.  Has f/u scheduled next month.

## 2018-05-30 NOTE — Assessment & Plan Note (Signed)
Recent cxr as outlined.  Recently admitted and treated for pneumonia.  Breathing better.  No increased cough/congestion.  Discussed the need for f/u cxr to confirm clears.

## 2018-05-30 NOTE — Assessment & Plan Note (Signed)
Low carb diet and exercise.  Follow met b and a1c.  

## 2018-05-30 NOTE — Assessment & Plan Note (Signed)
Continue f/u with cardiology.  Stable.

## 2018-05-30 NOTE — Assessment & Plan Note (Signed)
On lipitor.  Low cholesterol diet and exercise.  Follow lipid panel and liver function tests.   

## 2018-05-30 NOTE — Assessment & Plan Note (Signed)
On lipitor

## 2018-05-30 NOTE — Assessment & Plan Note (Signed)
Blood pressure under good control.  Continue same medication regimen.  Follow pressures.  Follow metabolic panel.   

## 2018-05-30 NOTE — Assessment & Plan Note (Signed)
Stable

## 2018-05-31 DIAGNOSIS — R531 Weakness: Secondary | ICD-10-CM | POA: Diagnosis not present

## 2018-05-31 DIAGNOSIS — J189 Pneumonia, unspecified organism: Secondary | ICD-10-CM | POA: Diagnosis not present

## 2018-05-31 DIAGNOSIS — R2689 Other abnormalities of gait and mobility: Secondary | ICD-10-CM | POA: Diagnosis not present

## 2018-06-02 DIAGNOSIS — R531 Weakness: Secondary | ICD-10-CM | POA: Diagnosis not present

## 2018-06-02 DIAGNOSIS — J189 Pneumonia, unspecified organism: Secondary | ICD-10-CM | POA: Diagnosis not present

## 2018-06-02 DIAGNOSIS — R2689 Other abnormalities of gait and mobility: Secondary | ICD-10-CM | POA: Diagnosis not present

## 2018-06-03 DIAGNOSIS — R2689 Other abnormalities of gait and mobility: Secondary | ICD-10-CM | POA: Diagnosis not present

## 2018-06-03 DIAGNOSIS — J189 Pneumonia, unspecified organism: Secondary | ICD-10-CM | POA: Diagnosis not present

## 2018-06-03 DIAGNOSIS — R531 Weakness: Secondary | ICD-10-CM | POA: Diagnosis not present

## 2018-06-08 DIAGNOSIS — J189 Pneumonia, unspecified organism: Secondary | ICD-10-CM | POA: Diagnosis not present

## 2018-06-08 DIAGNOSIS — R2689 Other abnormalities of gait and mobility: Secondary | ICD-10-CM | POA: Diagnosis not present

## 2018-06-08 DIAGNOSIS — R531 Weakness: Secondary | ICD-10-CM | POA: Diagnosis not present

## 2018-06-10 DIAGNOSIS — R531 Weakness: Secondary | ICD-10-CM | POA: Diagnosis not present

## 2018-06-10 DIAGNOSIS — R2689 Other abnormalities of gait and mobility: Secondary | ICD-10-CM | POA: Diagnosis not present

## 2018-06-10 DIAGNOSIS — J189 Pneumonia, unspecified organism: Secondary | ICD-10-CM | POA: Diagnosis not present

## 2018-07-02 DIAGNOSIS — R69 Illness, unspecified: Secondary | ICD-10-CM | POA: Diagnosis not present

## 2018-07-02 DIAGNOSIS — M48061 Spinal stenosis, lumbar region without neurogenic claudication: Secondary | ICD-10-CM | POA: Diagnosis not present

## 2018-07-02 DIAGNOSIS — R918 Other nonspecific abnormal finding of lung field: Secondary | ICD-10-CM | POA: Diagnosis not present

## 2018-07-02 DIAGNOSIS — X58XXXA Exposure to other specified factors, initial encounter: Secondary | ICD-10-CM | POA: Diagnosis not present

## 2018-07-02 DIAGNOSIS — R05 Cough: Secondary | ICD-10-CM | POA: Diagnosis not present

## 2018-07-02 DIAGNOSIS — T17800A Unspecified foreign body in other parts of respiratory tract causing asphyxiation, initial encounter: Secondary | ICD-10-CM | POA: Diagnosis not present

## 2018-07-02 DIAGNOSIS — I951 Orthostatic hypotension: Secondary | ICD-10-CM | POA: Diagnosis not present

## 2018-07-02 DIAGNOSIS — Z79899 Other long term (current) drug therapy: Secondary | ICD-10-CM | POA: Diagnosis not present

## 2018-07-02 DIAGNOSIS — T17290A Other foreign object in pharynx causing asphyxiation, initial encounter: Secondary | ICD-10-CM | POA: Diagnosis not present

## 2018-07-02 DIAGNOSIS — R296 Repeated falls: Secondary | ICD-10-CM | POA: Diagnosis not present

## 2018-07-02 DIAGNOSIS — G231 Progressive supranuclear ophthalmoplegia [Steele-Richardson-Olszewski]: Secondary | ICD-10-CM | POA: Diagnosis not present

## 2018-07-28 ENCOUNTER — Inpatient Hospital Stay
Admission: EM | Admit: 2018-07-28 | Discharge: 2018-07-30 | DRG: 291 | Disposition: A | Discharge status: Home / Self Care | Payer: Medicare HMO | Attending: Internal Medicine | Admitting: Internal Medicine

## 2018-07-28 ENCOUNTER — Emergency Department: Payer: Medicare HMO

## 2018-07-28 ENCOUNTER — Other Ambulatory Visit: Payer: Self-pay

## 2018-07-28 DIAGNOSIS — E7800 Pure hypercholesterolemia, unspecified: Secondary | ICD-10-CM | POA: Diagnosis not present

## 2018-07-28 DIAGNOSIS — N400 Enlarged prostate without lower urinary tract symptoms: Secondary | ICD-10-CM | POA: Diagnosis present

## 2018-07-28 DIAGNOSIS — Z881 Allergy status to other antibiotic agents status: Secondary | ICD-10-CM | POA: Diagnosis not present

## 2018-07-28 DIAGNOSIS — R531 Weakness: Secondary | ICD-10-CM | POA: Diagnosis not present

## 2018-07-28 DIAGNOSIS — I2510 Atherosclerotic heart disease of native coronary artery without angina pectoris: Secondary | ICD-10-CM | POA: Diagnosis not present

## 2018-07-28 DIAGNOSIS — R296 Repeated falls: Secondary | ICD-10-CM | POA: Diagnosis present

## 2018-07-28 DIAGNOSIS — Z951 Presence of aortocoronary bypass graft: Secondary | ICD-10-CM | POA: Diagnosis not present

## 2018-07-28 DIAGNOSIS — I482 Chronic atrial fibrillation, unspecified: Secondary | ICD-10-CM | POA: Diagnosis not present

## 2018-07-28 DIAGNOSIS — H409 Unspecified glaucoma: Secondary | ICD-10-CM | POA: Diagnosis present

## 2018-07-28 DIAGNOSIS — I361 Nonrheumatic tricuspid (valve) insufficiency: Secondary | ICD-10-CM | POA: Diagnosis not present

## 2018-07-28 DIAGNOSIS — Z20828 Contact with and (suspected) exposure to other viral communicable diseases: Secondary | ICD-10-CM | POA: Diagnosis present

## 2018-07-28 DIAGNOSIS — Z7951 Long term (current) use of inhaled steroids: Secondary | ICD-10-CM | POA: Diagnosis not present

## 2018-07-28 DIAGNOSIS — Z87891 Personal history of nicotine dependence: Secondary | ICD-10-CM

## 2018-07-28 DIAGNOSIS — J189 Pneumonia, unspecified organism: Secondary | ICD-10-CM | POA: Diagnosis not present

## 2018-07-28 DIAGNOSIS — R0902 Hypoxemia: Secondary | ICD-10-CM | POA: Diagnosis not present

## 2018-07-28 DIAGNOSIS — E785 Hyperlipidemia, unspecified: Secondary | ICD-10-CM | POA: Diagnosis present

## 2018-07-28 DIAGNOSIS — I700 Atherosclerosis of aorta: Secondary | ICD-10-CM | POA: Diagnosis not present

## 2018-07-28 DIAGNOSIS — G231 Progressive supranuclear ophthalmoplegia [Steele-Richardson-Olszewski]: Secondary | ICD-10-CM | POA: Diagnosis not present

## 2018-07-28 DIAGNOSIS — W19XXXA Unspecified fall, initial encounter: Secondary | ICD-10-CM | POA: Diagnosis not present

## 2018-07-28 DIAGNOSIS — K219 Gastro-esophageal reflux disease without esophagitis: Secondary | ICD-10-CM | POA: Diagnosis present

## 2018-07-28 DIAGNOSIS — Z79899 Other long term (current) drug therapy: Secondary | ICD-10-CM

## 2018-07-28 DIAGNOSIS — Z7982 Long term (current) use of aspirin: Secondary | ICD-10-CM | POA: Diagnosis not present

## 2018-07-28 DIAGNOSIS — I5033 Acute on chronic diastolic (congestive) heart failure: Secondary | ICD-10-CM | POA: Diagnosis present

## 2018-07-28 DIAGNOSIS — I4891 Unspecified atrial fibrillation: Secondary | ICD-10-CM | POA: Diagnosis not present

## 2018-07-28 DIAGNOSIS — I340 Nonrheumatic mitral (valve) insufficiency: Secondary | ICD-10-CM | POA: Diagnosis not present

## 2018-07-28 DIAGNOSIS — I110 Hypertensive heart disease with heart failure: Secondary | ICD-10-CM | POA: Diagnosis not present

## 2018-07-28 DIAGNOSIS — Z880 Allergy status to penicillin: Secondary | ICD-10-CM

## 2018-07-28 DIAGNOSIS — Z9181 History of falling: Secondary | ICD-10-CM | POA: Diagnosis not present

## 2018-07-28 DIAGNOSIS — R0602 Shortness of breath: Secondary | ICD-10-CM | POA: Diagnosis not present

## 2018-07-28 LAB — URINALYSIS, COMPLETE (UACMP) WITH MICROSCOPIC
Bacteria, UA: NONE SEEN
Bilirubin Urine: NEGATIVE
Glucose, UA: NEGATIVE mg/dL
Ketones, ur: NEGATIVE mg/dL
Leukocytes,Ua: NEGATIVE
Nitrite: NEGATIVE
Protein, ur: 30 mg/dL — AB
Specific Gravity, Urine: 1.014 (ref 1.005–1.030)
Squamous Epithelial / LPF: NONE SEEN (ref 0–5)
WBC, UA: NONE SEEN WBC/hpf (ref 0–5)
pH: 8 (ref 5.0–8.0)

## 2018-07-28 LAB — COMPREHENSIVE METABOLIC PANEL
ALT: 22 U/L (ref 0–44)
AST: 29 U/L (ref 15–41)
Albumin: 4.1 g/dL (ref 3.5–5.0)
Alkaline Phosphatase: 76 U/L (ref 38–126)
Anion gap: 9 (ref 5–15)
BUN: 16 mg/dL (ref 8–23)
CO2: 22 mmol/L (ref 22–32)
Calcium: 8.6 mg/dL — ABNORMAL LOW (ref 8.9–10.3)
Chloride: 100 mmol/L (ref 98–111)
Creatinine, Ser: 0.79 mg/dL (ref 0.61–1.24)
GFR calc Af Amer: >60 mL/min (ref >60)
GFR calc non Af Amer: >60 mL/min (ref >60)
Glucose, Bld: 118 mg/dL — ABNORMAL HIGH (ref 70–99)
Potassium: 4.1 mmol/L (ref 3.5–5.1)
Sodium: 131 mmol/L — ABNORMAL LOW (ref 135–145)
Total Bilirubin: 1.3 mg/dL — ABNORMAL HIGH (ref 0.3–1.2)
Total Protein: 7 g/dL (ref 6.5–8.1)

## 2018-07-28 LAB — TROPONIN I (HIGH SENSITIVITY)
Troponin I (High Sensitivity): 23 ng/L — ABNORMAL HIGH (ref <18)
Troponin I (High Sensitivity): 36 ng/L — ABNORMAL HIGH (ref <18)

## 2018-07-28 LAB — CBC WITH DIFFERENTIAL/PLATELET
Abs Immature Granulocytes: 0.14 10*3/uL — ABNORMAL HIGH (ref 0.00–0.07)
Basophils Absolute: 0 10*3/uL (ref 0.0–0.1)
Basophils Relative: 0 %
Eosinophils Absolute: 0 10*3/uL (ref 0.0–0.5)
Eosinophils Relative: 0 %
HCT: 38 % — ABNORMAL LOW (ref 39.0–52.0)
Hemoglobin: 13.1 g/dL (ref 13.0–17.0)
Immature Granulocytes: 1 %
Lymphocytes Relative: 4 %
Lymphs Abs: 0.7 10*3/uL (ref 0.7–4.0)
MCH: 31.3 pg (ref 26.0–34.0)
MCHC: 34.5 g/dL (ref 30.0–36.0)
MCV: 90.9 fL (ref 80.0–100.0)
Monocytes Absolute: 1.5 10*3/uL — ABNORMAL HIGH (ref 0.1–1.0)
Monocytes Relative: 9 %
Neutro Abs: 13.4 10*3/uL — ABNORMAL HIGH (ref 1.7–7.7)
Neutrophils Relative %: 86 %
Platelets: 186 10*3/uL (ref 150–400)
RBC: 4.18 MIL/uL — ABNORMAL LOW (ref 4.22–5.81)
RDW: 15.7 % — ABNORMAL HIGH (ref 11.5–15.5)
WBC: 15.7 10*3/uL — ABNORMAL HIGH (ref 4.0–10.5)
nRBC: 0 % (ref 0.0–0.2)

## 2018-07-28 LAB — SARS CORONAVIRUS 2 BY RT PCR (HOSPITAL ORDER, PERFORMED IN ~~LOC~~ HOSPITAL LAB): SARS Coronavirus 2: NEGATIVE

## 2018-07-28 LAB — BRAIN NATRIURETIC PEPTIDE: B Natriuretic Peptide: 235 pg/mL — ABNORMAL HIGH (ref 0.0–100.0)

## 2018-07-28 MED ORDER — CARBIDOPA-LEVODOPA 25-100 MG PO TABS
2.0000 | ORAL_TABLET | Freq: Three times a day (TID) | ORAL | Status: DC
  Administered 2018-07-28 – 2018-07-30 (×5): 2 via ORAL
  Filled 2018-07-28 (×7): qty 2

## 2018-07-28 MED ORDER — IPRATROPIUM-ALBUTEROL 0.5-2.5 (3) MG/3ML IN SOLN: 3.0000 mL | Freq: Four times a day (QID) | RESPIRATORY_TRACT | Status: DC | PRN

## 2018-07-28 MED ORDER — FUROSEMIDE 20 MG PO TABS
20.0000 mg | ORAL_TABLET | Freq: Every day | ORAL | Status: DC
  Administered 2018-07-29: 20 mg via ORAL
  Filled 2018-07-28: qty 1

## 2018-07-28 MED ORDER — LATANOPROST 0.005 % OP SOLN
1.0000 [drp] | Freq: Every day | OPHTHALMIC | Status: DC
  Administered 2018-07-28 – 2018-07-29 (×2): 1 [drp] via OPHTHALMIC
  Filled 2018-07-28: qty 2.5

## 2018-07-28 MED ORDER — PANTOPRAZOLE SODIUM 40 MG PO TBEC
40.0000 mg | DELAYED_RELEASE_TABLET | Freq: Every day | ORAL | Status: DC
  Administered 2018-07-29 – 2018-07-30 (×2): 40 mg via ORAL
  Filled 2018-07-28 (×2): qty 1

## 2018-07-28 MED ORDER — ATORVASTATIN CALCIUM 20 MG PO TABS
20.0000 mg | ORAL_TABLET | Freq: Every day | ORAL | Status: DC
  Administered 2018-07-29: 20 mg via ORAL

## 2018-07-28 MED ORDER — FLUTICASONE FUROATE-VILANTEROL 200-25 MCG/INH IN AEPB
1.0000 | INHALATION_SPRAY | Freq: Every day | RESPIRATORY_TRACT | Status: DC
  Administered 2018-07-29 – 2018-07-30 (×2): 1 via RESPIRATORY_TRACT
  Filled 2018-07-28: qty 28

## 2018-07-28 MED ORDER — CITALOPRAM HYDROBROMIDE 20 MG PO TABS
40.0000 mg | ORAL_TABLET | Freq: Every day | ORAL | Status: DC
  Administered 2018-07-29 – 2018-07-30 (×2): 40 mg via ORAL
  Filled 2018-07-28 (×2): qty 2

## 2018-07-28 MED ORDER — SODIUM CHLORIDE 0.9 % IV SOLN
500.0000 mg | Freq: Once | INTRAVENOUS | Status: AC
  Administered 2018-07-28: 500 mg via INTRAVENOUS
  Filled 2018-07-28: qty 500

## 2018-07-28 MED ORDER — VITAMIN C 500 MG PO TABS
250.0000 mg | ORAL_TABLET | Freq: Every day | ORAL | Status: DC
  Administered 2018-07-29 – 2018-07-30 (×2): 250 mg via ORAL
  Filled 2018-07-28 (×2): qty 1

## 2018-07-28 MED ORDER — SODIUM CHLORIDE 0.9% FLUSH
3.0000 mL | Freq: Two times a day (BID) | INTRAVENOUS | Status: DC
  Administered 2018-07-28 – 2018-07-30 (×4): 3 mL via INTRAVENOUS

## 2018-07-28 MED ORDER — FLUTICASONE PROPIONATE 50 MCG/ACT NA SUSP
1.0000 | Freq: Every day | NASAL | Status: DC
  Administered 2018-07-29 – 2018-07-30 (×2): 1 via NASAL
  Filled 2018-07-28: qty 16

## 2018-07-28 MED ORDER — METOPROLOL TARTRATE 25 MG PO TABS
25.0000 mg | ORAL_TABLET | Freq: Two times a day (BID) | ORAL | Status: DC
  Administered 2018-07-28 – 2018-07-30 (×4): 25 mg via ORAL
  Filled 2018-07-28 (×4): qty 1

## 2018-07-28 MED ORDER — SODIUM CHLORIDE 0.9 % IV SOLN
1.0000 g | Freq: Once | INTRAVENOUS | Status: AC
  Administered 2018-07-28: 1 g via INTRAVENOUS
  Filled 2018-07-28: qty 10

## 2018-07-28 MED ORDER — ACETAMINOPHEN 500 MG PO TABS
1000.0000 mg | ORAL_TABLET | Freq: Once | ORAL | Status: AC
  Administered 2018-07-28: 1000 mg via ORAL
  Filled 2018-07-28: qty 2

## 2018-07-28 MED ORDER — ENOXAPARIN SODIUM 40 MG/0.4ML ~~LOC~~ SOLN
40.0000 mg | SUBCUTANEOUS | Status: DC
  Administered 2018-07-28 – 2018-07-29 (×2): 40 mg via SUBCUTANEOUS
  Filled 2018-07-28 (×2): qty 0.4

## 2018-07-28 MED ORDER — SODIUM CHLORIDE 0.9 % IV SOLN: 250.0000 mL | INTRAVENOUS | Status: DC | PRN

## 2018-07-28 MED ORDER — ADULT MULTIVITAMIN W/MINERALS CH
1.0000 | ORAL_TABLET | Freq: Every day | ORAL | Status: DC
  Administered 2018-07-29 – 2018-07-30 (×2): 1 via ORAL
  Filled 2018-07-28 (×2): qty 1

## 2018-07-28 MED ORDER — SODIUM CHLORIDE 0.9% FLUSH: 3.0000 mL | INTRAVENOUS | Status: DC | PRN

## 2018-07-28 MED ORDER — AMLODIPINE BESYLATE 5 MG PO TABS
5.0000 mg | ORAL_TABLET | Freq: Every day | ORAL | Status: DC
  Administered 2018-07-29 – 2018-07-30 (×2): 5 mg via ORAL
  Filled 2018-07-28 (×2): qty 1

## 2018-07-28 MED ORDER — AZELASTINE HCL 0.1 % NA SOLN
1.0000 | Freq: Two times a day (BID) | NASAL | Status: DC
  Administered 2018-07-28 – 2018-07-30 (×4): 1 via NASAL
  Filled 2018-07-28: qty 30

## 2018-07-28 MED ORDER — TAMSULOSIN HCL 0.4 MG PO CAPS
0.4000 mg | ORAL_CAPSULE | Freq: Every day | ORAL | Status: DC
  Administered 2018-07-29: 18:00:00 0.4 mg via ORAL
  Filled 2018-07-28: qty 1

## 2018-07-28 NOTE — ED Notes (Signed)
Condom cath placed on pt at this time due to urination frequency and weakness. Pt has been up to use urinal 3 times since arriving to ED

## 2018-07-28 NOTE — H&P (Signed)
[facility] [place] NAME: Tyler Stout    MR#:  [id number]  DATE OF BIRTH:  [birth year]  DATE OF ADMISSION:  07/28/2018  PRIMARY CARE PHYSICIAN: [name], MD   REQUESTING/REFERRING PHYSICIAN: Conni Slipper, MD  CHIEF COMPLAINT:   Chief Complaint  Patient presents with  . Fever  . Fall  . Fatigue    HISTORY OF PRESENT ILLNESS:  Tyler Stout  is a 85 y.o. male with a known history of atrial fibrillation, coronary artery disease with history of coronary artery bypass graft x4 as well as a history of hyperlipidemia.  He presented to the emergency room via EMS services from home where he lives with his wife.  He presented with complaints of increased weakness and shortness of breath having fallen twice over the last 2 days.  He is also complaining of low-grade fever at home.  Patient also notes a cough which has been productive of light yellow and clear mucus.  He denies chest pain.  He denies abdominal pain.  He denies diarrhea.  EMS reported oxygen saturation of 92% which increased to 95% on 2 L nasal cannula.  In the emergency room on arrival, he had oxygen saturations of 95% on room air.  Patient has a history of progressive supranuclear palsy with frequent falls and aspiration.  He was scheduled for swallow study today.  This has been ordered with speech consultation during this hospitalization as patient has required admission.  Rapid COVID testing is negative.  Chest x-ray is suspicious for possible community-acquired pneumonia.  He was started on IV Rocephin and azithromycin in the emergency room.  This will be continued.  He has been admitted to the hospitalist service for further management.  PAST MEDICAL HISTORY:   Past Medical History:  Diagnosis Date  . Anemia    iron deficiency  . Arthritis   . Atrial fibrillation ([place])   . Bilateral renal cysts    worked up by Dr Bernardo Heater  . BPH (benign prostatic hypertrophy)   . CAD  (coronary artery disease)    s/p bypass graft x 4 (1991)  . Diverticulosis   . GERD (gastroesophageal reflux disease)   . Glaucoma   . Hypercholesterolemia   . Hypertension   . Pancreatitis    x2 (1994 and 1998), presumed to be gallstone pancreatitis.  s/p ERCP and sphincterotomy    PAST SURGICAL HISTORY:   Past Surgical History:  Procedure Laterality Date  . APPENDECTOMY    . CHOLECYSTECTOMY  1994  . CORONARY ARTERY BYPASS GRAFT     x 4,  1991  . INGUINAL HERNIA REPAIR  2009  . open heart surgery  1991    SOCIAL HISTORY:   Social History   Tobacco Use  . Smoking status: Former Smoker    Packs/day: 2.00    Years: 25.00    Pack years: 50.00    Types: Cigarettes    Quit date: 01/14/1964    Years since quitting: 54.5  . Smokeless tobacco: Never Used  . Tobacco comment: smoked 2 packs/day, 1 pipe and 1 cigar once in a while.  Substance Use Topics  . Alcohol use: No    Alcohol/week: 0.0 standard drinks    FAMILY HISTORY:   Family History  Problem Relation Age of Onset  . Throat cancer Father        oropharyngeal cancer  . Kidney disease Sister   . Heart disease Other   . Colon cancer Neg  Hx   . Prostate cancer Neg Hx     DRUG ALLERGIES:   Allergies  Allergen Reactions  . Ilosone [Erythromycin] Other (See Comments)    Reaction:  GI upset   . Penicillins Rash and Other (See Comments)    Has patient had a PCN reaction causing immediate rash, facial/tongue/throat swelling, SOB or lightheadedness with hypotension: No Has patient had a PCN reaction causing severe rash involving mucus membranes or skin necrosis: No Has patient had a PCN reaction that required hospitalization No Has patient had a PCN reaction occurring within the last 10 years: No If all of the above answers are "NO", then may proceed with Cephalosporin use.    REVIEW OF SYSTEMS:   Review of Systems  Constitutional: Positive for fever and malaise/fatigue. Negative for chills, diaphoresis and  weight loss.  HENT: Positive for congestion. Negative for sinus pain and sore throat.   Eyes: Negative for blurred vision and double vision.  Respiratory: Positive for cough, sputum production and shortness of breath. Negative for wheezing.   Cardiovascular: Negative for chest pain, palpitations and leg swelling.  Gastrointestinal: Positive for nausea. Negative for abdominal pain, constipation, diarrhea, heartburn and vomiting.  Genitourinary: Negative for dysuria, flank pain and hematuria.  Musculoskeletal: Positive for falls. Negative for back pain, joint pain, myalgias and neck pain.  Skin: Negative for itching and rash.  Neurological: Negative for dizziness, tingling, speech change, focal weakness, loss of consciousness and headaches.  Psychiatric/Behavioral: Negative.  Negative for depression.     MEDICATIONS AT HOME:   Prior to Admission medications   Medication Sig Start Date End Date Taking? Authorizing Provider  amLODipine (NORVASC) 5 MG tablet TAKE 1 TABLET (5 MG TOTAL) BY MOUTH DAILY. 04/12/18  Yes [name], MD  Ascorbic Acid (VITAMIN C PO) Take 1 tablet by mouth daily. Reported on 06/20/2015   Yes [provider]  atorvastatin (LIPITOR) 20 MG tablet TAKE 1 TABLET (20 MG TOTAL) BY MOUTH DAILY. 11/12/17  Yes [name], MD  azelastine (ASTELIN) 0.1 % nasal spray PLACE 1 SPRAY INTO BOTH NOSTRILS 2 (TWO) TIMES DAILY 05/26/18  Yes [name], MD  BREO ELLIPTA 200-25 MCG/INH AEPB TAKE 1 PUFF BY MOUTH EVERY DAY 06/29/18  Yes [provider]  carbidopa-levodopa (SINEMET IR) 25-100 MG tablet TAKE 2 TABLETS BY MOUTH 3 TIMES DAILY 04/10/17  Yes [provider]  citalopram (CELEXA) 20 MG tablet Take 20 tablets by mouth daily.  10/08/15  Yes [provider]  fluticasone (FLONASE) 50 MCG/ACT nasal spray SPRAY 2 SPRAYS INTO EACH NOSTRIL EVERY DAY 05/28/18  Yes [name], MD  furosemide (LASIX) 20 MG tablet Take 20 mg by mouth daily.    Yes  [provider]  latanoprost (XALATAN) 0.005 % ophthalmic solution Place 1 drop into both eyes at bedtime.    Yes [provider]  metoprolol tartrate (LOPRESSOR) 25 MG tablet TAKE 1 TABLET BY MOUTH TWICE A DAY 05/27/16  Yes [provider]  Multiple Vitamin (MULTIVITAMIN WITH MINERALS) TABS tablet Take 1 tablet by mouth daily.   Yes [provider]  pantoprazole (PROTONIX) 40 MG tablet TAKE 1 TABLET BY MOUTH DAILY 05/13/18  Yes [name], MD  tamsulosin (FLOMAX) 0.4 MG CAPS capsule TAKE 1 CAPSULE (0.4 MG TOTAL) BY MOUTH DAILY. 04/12/18  Yes [name], MD      VITAL SIGNS:  Blood pressure 135/73, pulse (!) 50, temperature 97.6 F (36.4 C), temperature source Oral, resp. rate 19, height 5\' 5"  (1.651 m), weight  66 kg, SpO2 98 %.  PHYSICAL EXAMINATION:  Physical Exam  GENERAL:  85 y.o.-year-old patient lying in the bed with no acute distress.  EYES: Pupils equal, round, reactive to light and accommodation. No scleral icterus. Extraocular muscles intact.  HEENT: Head atraumatic, normocephalic. Oropharynx and nasopharynx clear.  NECK:  Supple, no jugular venous distention. No thyroid enlargement, no tenderness.  LUNGS: Normal breath sounds bilaterally, no wheezing, rales,rhonchi or crepitation. No use of accessory muscles of respiration.  CARDIOVASCULAR: Regular rate and rhythm, S1, S2 normal. No murmurs, rubs, or gallops.  ABDOMEN: Soft, nondistended, nontender. Bowel sounds present. No organomegaly or mass.  EXTREMITIES: No pedal edema, cyanosis, or clubbing.  NEUROLOGIC: Cranial nerves II through XII are intact.  Generalized weakness. Sensation intact. Gait not checked.  PSYCHIATRIC: The patient is alert and oriented x 3.  Normal affect and good eye contact. SKIN: No obvious rash, lesion, or ulcer.   LABORATORY PANEL:   CBC Recent Labs  Lab 07/28/18 1603  WBC 15.7*  HGB 13.1  HCT 38.0*  PLT 186    ------------------------------------------------------------------------------------------------------------------  Chemistries  Recent Labs  Lab 07/28/18 1603  NA 131*  K 4.1  CL 100  CO2 22  GLUCOSE 118*  BUN 16  CREATININE 0.79  CALCIUM 8.6*  AST 29  ALT 22  ALKPHOS 76  BILITOT 1.3*   ------------------------------------------------------------------------------------------------------------------  Cardiac Enzymes No results for input(s): TROPONINI in the last 168 hours. ------------------------------------------------------------------------------------------------------------------  RADIOLOGY:  Dg Chest Portable 1 View  Result Date: 07/28/2018 CLINICAL DATA:  Shortness of breath EXAM: PORTABLE CHEST 1 VIEW COMPARISON:  05/07/2018 FINDINGS: Cardiomegaly. Prior CABG. Interstitial prominence throughout the lungs improved since prior study, likely improving edema. Suspect small effusions. No acute bony abnormality. IMPRESSION: Fifth cardiomegaly. Improving interstitial edema pattern. Suspect continued mild interstitial edema and probable small effusions. Electronically Signed   By: Rolm Baptise M.D.   On: 07/28/2018 16:44      IMPRESSION AND PLAN:   1.  Community-acquired pneumonia - IV Rocephin and azithromycin - DuoNeb every 6 hours as needed for shortness of breath and cough -O2 per nasal cannula - We will treat fever with PRN Tylenol - WBC is 15.7 -Will repeat CBC in the a.m. and continue to monitor leukocytosis closely  2. frequent falls - With generalized weakness -We will consult physical therapy for evaluation and supportive care -No evidence of unilateral weakness   3.  Supranuclear palsy - With risk for aspiration - Will consult speech therapy for swallowing evaluation - Aspiration precautions  4.  Atrial fibrillation - Patient is on telemetry monitoring - Beta-blocker continued - Will repeat CBC and BMP in the a.m.   DVT and PPI prophylaxis  initiated   All the records are reviewed and case discussed with ED provider. The plan of care was discussed in details with the patient (and family). I answered all questions. The patient agreed to proceed with the above mentioned plan. Further management will depend upon hospital course.   CODE STATUS: Full code  TOTAL TIME TAKING CARE OF THIS PATIENT: 45 minutes.    [place] on 07/28/2018 at 9:58 PM  Pager - [phone]  After 6pm go to [email or web address] - Proofreader  [facility]  Office  [phone]  CC: Primary care physician; [name], MD   Note: This dictation was prepared with Dragon dictation along with smaller phrase technology. Any transcriptional errors that result from this process are unintentional.

## 2018-07-28 NOTE — ED Provider Notes (Signed)
[facility] Emergency Department Provider Note   ____________________________________________   First MD Initiated Contact with Patient 07/28/18 1532     (approximate)  I have reviewed the triage vital signs and the nursing notes.   HISTORY  Chief Complaint Fever, Fall, and Fatigue    HPI Tyler Stout is a 85 y.o. male who comes to the ER via EMS.  He is running a fever low-grade feeling weak and is fallen twice.  He denies any coughing or chest pain.  He is somewhat short of breath.  EMS reported O2 sats were 92 and went up to 9596 with 2 L.  Here in the emergency room his O2 sats are 95 on room air.  Patient is having no belly pain dysuria or any other complaints at present.  Patient has been followed by neurology for progressive supra nuclear palsy frequent falls and aspiration.  He was to have a swallowing study scheduled today.         Past Medical History:  Diagnosis Date  . Anemia    iron deficiency  . Arthritis   . Atrial fibrillation ([place])   . Bilateral renal cysts    worked up by Dr Bernardo Heater  . BPH (benign prostatic hypertrophy)   . CAD (coronary artery disease)    s/p bypass graft x 4 (1991)  . Diverticulosis   . GERD (gastroesophageal reflux disease)   . Glaucoma   . Hypercholesterolemia   . Hypertension   . Pancreatitis    x2 (1994 and 1998), presumed to be gallstone pancreatitis.  s/p ERCP and sphincterotomy    Patient Active Problem List   Diagnosis Date Noted  . PNA (pneumonia) 05/07/2018  . Cough 05/05/2018  . Hyperglycemia 09/13/2017  . Decreased pedal pulses 04/01/2017  . Dizziness 02/07/2017  . Progressive supranuclear palsy ([place]) 11/02/2016  . Thoracic aortic atherosclerosis ([place]) 08/12/2016  . Abnormal CXR 07/18/2016  . Urinary frequency 06/20/2015  . Lower GI bleed   . Demand ischemia ([place])   . Chronic atrial fibrillation   . Essential hypertension   . HLD (hyperlipidemia)   . BPH (benign prostatic  hyperplasia)   . Tremor 06/10/2015  . Loss of weight 06/10/2015  . GI bleed 06/08/2015  . Health care maintenance 08/11/2014  . Light headedness 03/19/2014  . Back pain 03/13/2013  . Hypokalemia 02/20/2013  . CAD (coronary artery disease) 12/07/2011  . Hyponatremia 12/07/2011  . Atrial fibrillation ([place]) 12/07/2011  . Hypertension 11/20/2011  . Hypercholesteremia 11/20/2011  . Anemia 11/20/2011    Past Surgical History:  Procedure Laterality Date  . APPENDECTOMY    . CHOLECYSTECTOMY  1994  . CORONARY ARTERY BYPASS GRAFT     x 4,  1991  . INGUINAL HERNIA REPAIR  2009  . open heart surgery  1991    Prior to Admission medications   Medication Sig Start Date End Date Taking? Authorizing Provider  amLODipine (NORVASC) 5 MG tablet TAKE 1 TABLET (5 MG TOTAL) BY MOUTH DAILY. 04/12/18   [name], MD  Ascorbic Acid (VITAMIN C PO) Take 1 tablet by mouth daily. Reported on 06/20/2015    [provider]  aspirin EC 81 MG tablet Take 81 mg by mouth daily.    [provider]  atorvastatin (LIPITOR) 20 MG tablet TAKE 1 TABLET (20 MG TOTAL) BY MOUTH DAILY. 11/12/17   [name], MD  azelastine (ASTELIN) 0.1 % nasal spray PLACE 1 SPRAY INTO BOTH NOSTRILS 2 (TWO) TIMES DAILY 05/26/18  [name], MD  carbidopa-levodopa (SINEMET IR) 25-100 MG tablet TAKE 2 TABLETS BY MOUTH 3 TIMES DAILY 04/10/17   [provider]  citalopram (CELEXA) 20 MG tablet Take 0.5 tablets by mouth daily. 10/08/15   [provider]  fluticasone (FLONASE) 50 MCG/ACT nasal spray SPRAY 2 SPRAYS INTO EACH NOSTRIL EVERY DAY 05/28/18   [name], MD  furosemide (LASIX) 20 MG tablet Take 20 mg by mouth daily as needed.    [provider]  guaiFENesin-dextromethorphan (ROBITUSSIN DM) 100-10 MG/5ML syrup Take 5 mLs by mouth every 8 (eight) hours as needed for cough (chest congestion). 05/09/18   Wieting, Richard, MD  latanoprost (XALATAN) 0.005 % ophthalmic solution Place 1  drop into both eyes at bedtime.     [provider]  MAGNESIUM PO Take 1 tablet by mouth daily. Reported on 06/20/2015    [provider]  metoprolol tartrate (LOPRESSOR) 25 MG tablet TAKE 1 TABLET BY MOUTH TWICE A DAY 05/27/16   [provider]  Multiple Vitamin (MULTIVITAMIN WITH MINERALS) TABS tablet Take 1 tablet by mouth daily.    [provider]  pantoprazole (PROTONIX) 40 MG tablet TAKE 1 TABLET BY MOUTH DAILY 05/13/18   [name], MD  tamsulosin (FLOMAX) 0.4 MG CAPS capsule TAKE 1 CAPSULE (0.4 MG TOTAL) BY MOUTH DAILY. 04/12/18   [name], MD    Allergies Ilosone [erythromycin] and Penicillins  Family History  Problem Relation Age of Onset  . Throat cancer Father        oropharyngeal cancer  . Kidney disease Sister   . Heart disease Other   . Colon cancer Neg Hx   . Prostate cancer Neg Hx     Social History Social History   Tobacco Use  . Smoking status: Former Smoker    Packs/day: 2.00    Years: 25.00    Pack years: 50.00    Types: Cigarettes    Quit date: 01/14/1964    Years since quitting: 54.5  . Smokeless tobacco: Never Used  . Tobacco comment: smoked 2 packs/day, 1 pipe and 1 cigar once in a while.  Substance Use Topics  . Alcohol use: No    Alcohol/week: 0.0 standard drinks  . Drug use: No    Review of Systems  Constitutional:  fever/no chills Eyes: No visual changes. ENT: No sore throat. Cardiovascular: Denies chest pain. Respiratory:shortness of breath. Gastrointestinal: No abdominal pain.  No nausea, no vomiting.  No diarrhea.  No constipation. Genitourinary: Negative for dysuria. Musculoskeletal: Negative for back pain. Skin: Negative for rash. Neurological: Negative for headaches, focal weakness  ____________________________________________   PHYSICAL EXAM:  VITAL SIGNS: ED Triage Vitals  Enc Vitals Group     BP 07/28/18 1542 140/61     Pulse Rate 07/28/18 1542 89     Resp 07/28/18 1542 20      Temp 07/28/18 1542 98.6 F (37 C)     Temp Source 07/28/18 1542 Oral     SpO2 07/28/18 1542 95 %     Weight 07/28/18 1543 148 lb (67.1 kg)     Height 07/28/18 1543 5\' 5"  (1.651 m)     Head Circumference --      Peak Flow --      Pain Score 07/28/18 1543 0     Pain Loc --      Pain Edu? --      Excl. in [address]? --     Constitutional: Alert and oriented. Well appearing and in no acute  distress. Eyes: Conjunctivae are normal.  Head: Atraumatic. Nose: No congestion/rhinnorhea. Mouth/Throat: Mucous membranes are moist.  Oropharynx non-erythematous. Neck: No stridor.   Cardiovascular: Normal rate, regular rhythm. Grossly normal heart sounds.  Good peripheral circulation. Respiratory: Normal respiratory effort.  No retractions. Lungs CTAB. Gastrointestinal: Soft and nontender. No distention. No abdominal bruits. No CVA tenderness. Musculoskeletal: No lower extremity tenderness nor edema.  Neurologic:  Normal speech and language. No gross focal neurologic deficits are appreciated. Skin:  Skin is warm, dry and intact. No rash noted.   ____________________________________________   LABS (all labs ordered are listed, but only abnormal results are displayed)  Labs Reviewed  URINALYSIS, COMPLETE (UACMP) WITH MICROSCOPIC - Abnormal; Notable for the following components:      Result Value   Color, Urine YELLOW (*)    APPearance CLEAR (*)    Hgb urine dipstick SMALL (*)    Protein, ur 30 (*)    All other components within normal limits  COMPREHENSIVE METABOLIC PANEL - Abnormal; Notable for the following components:   Sodium 131 (*)    Glucose, Bld 118 (*)    Calcium 8.6 (*)    Total Bilirubin 1.3 (*)    All other components within normal limits  CBC WITH DIFFERENTIAL/PLATELET - Abnormal; Notable for the following components:   WBC 15.7 (*)    RBC 4.18 (*)    HCT 38.0 (*)    RDW 15.7 (*)    Neutro Abs 13.4 (*)    Monocytes Absolute 1.5 (*)    Abs Immature Granulocytes 0.14 (*)     All other components within normal limits  TROPONIN I (HIGH SENSITIVITY) - Abnormal; Notable for the following components:   Troponin I (High Sensitivity) 23 (*)    All other components within normal limits  SARS CORONAVIRUS 2 (HOSPITAL ORDER, [place] [facility])  URINE CULTURE  CULTURE, BLOOD (ROUTINE X 2)  CULTURE, BLOOD (ROUTINE X 2)  BRAIN NATRIURETIC PEPTIDE  TROPONIN I (HIGH SENSITIVITY)   ____________________________________________  EKG EKG read and interpreted by me shows normal sinus rhythm rate of 88 left axis right bundle branch block I do not see any ST segment elevation.  ____________________________________________  RADIOLOGY  ED MD interpretation:    Official radiology report(s): Dg Chest Portable 1 View  Result Date: 07/28/2018 CLINICAL DATA:  Shortness of breath EXAM: PORTABLE CHEST 1 VIEW COMPARISON:  05/07/2018 FINDINGS: Cardiomegaly. Prior CABG. Interstitial prominence throughout the lungs improved since prior study, likely improving edema. Suspect small effusions. No acute bony abnormality. IMPRESSION: Fifth cardiomegaly. Improving interstitial edema pattern. Suspect continued mild interstitial edema and probable small effusions. Electronically Signed   By: Rolm Baptise M.D.   On: 07/28/2018 16:44    ____________________________________________   PROCEDURES  Procedure(s) performed (including Critical Care):  Procedures   ____________________________________________   INITIAL IMPRESSION / ASSESSMENT AND PLAN / ED COURSE  ----------------------------------------- 6:04 PM on 07/28/2018 -----------------------------------------  I spoke with the patient's daughter.  The patient had been doing well at home walking easily with his walker but today got very weak and had trouble getting out of bed when he did get out of bed and ended up having his legs give out on him in the middle of the hallway when he fell.  Here in the emergency room  week were able to get him up with some assistance but he was unable to take more than 3 or 4 steps before his legs just stopped working although he did not fall.  We were able to get him back into bed.  His white blood count is elevated.  He is running a low-grade fever.  Coronavirus test is negative urine is negative chest x-ray still little hazy although better than previously.  He may have a community-acquired pneumonia.  I will give him some antibiotics for this and will keep a close eye on him.  I believe the best thing to do with this gentleman would be to admit him.  I do not think he is safe to go home at present.              ____________________________________________   FINAL CLINICAL IMPRESSION(S) / ED DIAGNOSES  Final diagnoses:  Weakness     ED Discharge Orders    None       Note:  This document was prepared using Dragon voice recognition software and may include unintentional dictation errors.    Nena Polio, MD 07/28/18 [phone]

## 2018-07-28 NOTE — ED Notes (Signed)
Pt placed on 2L [place] at this time due to increased RR and labored breathing.

## 2018-07-28 NOTE — ED Notes (Signed)
MD and RN attempted to ambulate pt at this time. Pt becomes very weak, SOB and HR increases when attempting to ambulate with only a few steps. Audible wheezing with exertion.   MD updating spouse at this time

## 2018-07-28 NOTE — ED Notes (Signed)
ED TO INPATIENT HANDOFF REPORT  ED Nurse Name and Phone #:  Anson Crofts Name/Age/Gender Tyler Stout 85 y.o. male Room/Bed: ED01A/ED01A  Code Status   Code Status: Prior  Home/SNF/Other Home Patient oriented to: self, place and situation Is this baseline? Yes   Triage Complete: Triage complete  Chief Complaint Fever  Triage Note Pt arrives via ems from home. Pt treated here for pnemonia few months ago, reports body aches today with low fever starting yesterday. Pt had tylenol at 1300, temp 100.3 @ home, ems states their temp was 99.4. hx. afib. ems reports pt fell twice today, denies hitting head or any LOC. Pt a&o x 4 on arrival NAD noted at this time  EMS vitals: RR 20, 92% ROOM, 96% 3L, BP 132/67  CBG 148   Allergies Allergies  Allergen Reactions  . Ilosone [Erythromycin] Other (See Comments)    Reaction:  GI upset   . Penicillins Rash and Other (See Comments)    Has patient had a PCN reaction causing immediate rash, facial/tongue/throat swelling, SOB or lightheadedness with hypotension: No Has patient had a PCN reaction causing severe rash involving mucus membranes or skin necrosis: No Has patient had a PCN reaction that required hospitalization No Has patient had a PCN reaction occurring within the last 10 years: No If all of the above answers are "NO", then may proceed with Cephalosporin use.    Level of Care/Admitting Diagnosis ED Disposition    ED Disposition Condition [place] [facility] Area: [place] [[id number]]  Level of Care: Med-Surg [16]  Covid Evaluation: Confirmed COVID Negative  Diagnosis: Pneumonia [[id number]]  Admitting Physician: Mayer Camel [[id number]]  Attending Physician: Mayer Camel [[id number]]  Estimated length of stay: past midnight tomorrow  Certification:: I certify this patient will need inpatient services for at least 2 midnights  PT Class (Do Not Modify): Inpatient [101]  PT Acc Code (Do Not Modify):  Private [1]       B Medical/Surgery History Past Medical History:  Diagnosis Date  . Anemia    iron deficiency  . Arthritis   . Atrial fibrillation ([place])   . Bilateral renal cysts    worked up by Dr Bernardo Heater  . BPH (benign prostatic hypertrophy)   . CAD (coronary artery disease)    s/p bypass graft x 4 (1991)  . Diverticulosis   . GERD (gastroesophageal reflux disease)   . Glaucoma   . Hypercholesterolemia   . Hypertension   . Pancreatitis    x2 (1994 and 1998), presumed to be gallstone pancreatitis.  s/p ERCP and sphincterotomy   Past Surgical History:  Procedure Laterality Date  . APPENDECTOMY    . CHOLECYSTECTOMY  1994  . CORONARY ARTERY BYPASS GRAFT     x 4,  1991  . INGUINAL HERNIA REPAIR  2009  . open heart surgery  1991     A IV Location/Drains/Wounds Patient Lines/Drains/Airways Status   Active Line/Drains/Airways    Name:   Placement date:   Placement time:   Site:   Days:   Peripheral IV 05/08/18 Left;Anterior Forearm   05/08/18    1135    Forearm   81   Peripheral IV 07/28/18 Left Antecubital   07/28/18    1538    Antecubital   less than 1   Peripheral IV 07/28/18 Left Forearm   07/28/18    1603    Forearm   less than 1   Wound / Incision (  Open or Dehisced) 05/07/18 Other (Comment) Arm Left drainage   05/07/18    1856    Arm   82          Intake/Output Last 24 hours No intake or output data in the 24 hours ending 07/28/18 2052  Labs/Imaging Results for orders placed or performed during the hospital encounter of 07/28/18 (from the past 48 hour(s))  SARS Coronavirus 2 (CEPHEID- Performed in [place] hospital lab), Hosp Order     Status: None   Collection Time: 07/28/18  3:53 PM   Specimen: Nasopharyngeal  Result Value Ref Range   SARS Coronavirus 2 NEGATIVE NEGATIVE    Comment: (NOTE) If result is NEGATIVE SARS-CoV-2 target nucleic acids are NOT DETECTED. The SARS-CoV-2 RNA is generally detectable in upper and lower  respiratory specimens  during the acute phase of infection. The lowest  concentration of SARS-CoV-2 viral copies this assay can detect is 250  copies / mL. A negative result does not preclude SARS-CoV-2 infection  and should not be used as the sole basis for treatment or other  patient management decisions.  A negative result may occur with  improper specimen collection / handling, submission of specimen other  than nasopharyngeal swab, presence of viral mutation(s) within the  areas targeted by this assay, and inadequate number of viral copies  (<250 copies / mL). A negative result must be combined with clinical  observations, patient history, and epidemiological information. If result is POSITIVE SARS-CoV-2 target nucleic acids are DETECTED. The SARS-CoV-2 RNA is generally detectable in upper and lower  respiratory specimens dur ing the acute phase of infection.  Positive  results are indicative of active infection with SARS-CoV-2.  Clinical  correlation with patient history and other diagnostic information is  necessary to determine patient infection status.  Positive results do  not rule out bacterial infection or co-infection with other viruses. If result is PRESUMPTIVE POSTIVE SARS-CoV-2 nucleic acids MAY BE PRESENT.   A presumptive positive result was obtained on the submitted specimen  and confirmed on repeat testing.  While 2019 novel coronavirus  (SARS-CoV-2) nucleic acids may be present in the submitted sample  additional confirmatory testing may be necessary for epidemiological  and / or clinical management purposes  to differentiate between  SARS-CoV-2 and other Sarbecovirus currently known to infect humans.  If clinically indicated additional testing with an alternate test  methodology [phone]) is advised. The SARS-CoV-2 RNA is generally  detectable in upper and lower respiratory sp ecimens during the acute  phase of infection. The expected result is Negative. Fact Sheet for Patients:   StrictlyIdeas.no Fact Sheet for Healthcare Providers: BankingDealers.co.za This test is not yet approved or cleared by the [place] FDA and has been authorized for detection and/or diagnosis of SARS-CoV-2 by FDA under an Emergency Use Authorization (EUA).  This EUA will remain in effect (meaning this test can be used) for the duration of the COVID-19 declaration under Section 564(b)(1) of the Act, 21 U.S.C. section 360bbb-3(b)(1), unless the authorization is terminated or revoked sooner. Performed at [facility], [address] [place], [place] [address]   Urinalysis, Complete w Microscopic     Status: Abnormal   Collection Time: 07/28/18  4:03 PM  Result Value Ref Range   Color, Urine YELLOW (A) YELLOW   APPearance CLEAR (A) CLEAR   Specific Gravity, Urine 1.014 1.005 - 1.030   pH 8.0 5.0 - 8.0   Glucose, UA NEGATIVE NEGATIVE mg/dL   Hgb urine dipstick  SMALL (A) NEGATIVE   Bilirubin Urine NEGATIVE NEGATIVE   Ketones, ur NEGATIVE NEGATIVE mg/dL   Protein, ur 30 (A) NEGATIVE mg/dL   Nitrite NEGATIVE NEGATIVE   Leukocytes,Ua NEGATIVE NEGATIVE   RBC / HPF 6-10 0 - 5 RBC/hpf   WBC, UA NONE SEEN 0 - 5 WBC/hpf   Bacteria, UA NONE SEEN NONE SEEN   Squamous Epithelial / LPF NONE SEEN 0 - 5   Mucus PRESENT     Comment: Performed at [facility], [address] [place], [place] [address]  Comprehensive metabolic panel     Status: Abnormal   Collection Time: 07/28/18  4:03 PM  Result Value Ref Range   Sodium 131 (L) 135 - 145 mmol/L   Potassium 4.1 3.5 - 5.1 mmol/L   Chloride 100 98 - 111 mmol/L   CO2 22 22 - 32 mmol/L   Glucose, Bld 118 (H) 70 - 99 mg/dL   BUN 16 8 - 23 mg/dL   Creatinine, Ser 0.79 0.61 - 1.24 mg/dL   Calcium 8.6 (L) 8.9 - 10.3 mg/dL   Total Protein 7.0 6.5 - 8.1 g/dL   Albumin 4.1 3.5 - 5.0 g/dL   AST 29 15 - 41 U/L   ALT 22 0 - 44 U/L   Alkaline Phosphatase 76 38 - 126 U/L   Total  Bilirubin 1.3 (H) 0.3 - 1.2 mg/dL   GFR calc non Af Amer >60 >60 mL/min   GFR calc Af Amer >60 >60 mL/min   Anion gap 9 5 - 15    Comment: Performed at [facility], [place], [place] [address]  Troponin I (High Sensitivity)     Status: Abnormal   Collection Time: 07/28/18  4:03 PM  Result Value Ref Range   Troponin I (High Sensitivity) 23 (H) <18 ng/L    Comment: (NOTE) Elevated high sensitivity troponin I (hsTnI) values and significant  changes across serial measurements may suggest ACS but many other  chronic and acute conditions are known to elevate hsTnI results.  Refer to the "Links" section for chest pain algorithms and additional  guidance. Performed at [facility], [address] [place], [place] [address]   CBC with Differential     Status: Abnormal   Collection Time: 07/28/18  4:03 PM  Result Value Ref Range   WBC 15.7 (H) 4.0 - 10.5 K/uL   RBC 4.18 (L) 4.22 - 5.81 MIL/uL   Hemoglobin 13.1 13.0 - 17.0 g/dL   HCT 38.0 (L) 39.0 - 52.0 %   MCV 90.9 80.0 - 100.0 fL   MCH 31.3 26.0 - 34.0 pg   MCHC 34.5 30.0 - 36.0 g/dL   RDW 15.7 (H) 11.5 - 15.5 %   Platelets 186 150 - 400 K/uL   nRBC 0.0 0.0 - 0.2 %   Neutrophils Relative % 86 %   Neutro Abs 13.4 (H) 1.7 - 7.7 K/uL   Lymphocytes Relative 4 %   Lymphs Abs 0.7 0.7 - 4.0 K/uL   Monocytes Relative 9 %   Monocytes Absolute 1.5 (H) 0.1 - 1.0 K/uL   Eosinophils Relative 0 %   Eosinophils Absolute 0.0 0.0 - 0.5 K/uL   Basophils Relative 0 %   Basophils Absolute 0.0 0.0 - 0.1 K/uL   Immature Granulocytes 1 %   Abs Immature Granulocytes 0.14 (H) 0.00 - 0.07 K/uL    Comment: Performed at [facility], [address], [place], [place] [address]  Brain natriuretic  peptide     Status: Abnormal   Collection Time: 07/28/18  4:03 PM  Result Value Ref Range   B Natriuretic Peptide 235.0 (H) 0.0 - 100.0 pg/mL    Comment: Performed at [facility], [address]  [place], [place] [address]   Dg Chest Portable 1 View  Result Date: 07/28/2018 CLINICAL DATA:  Shortness of breath EXAM: PORTABLE CHEST 1 VIEW COMPARISON:  05/07/2018 FINDINGS: Cardiomegaly. Prior CABG. Interstitial prominence throughout the lungs improved since prior study, likely improving edema. Suspect small effusions. No acute bony abnormality. IMPRESSION: Fifth cardiomegaly. Improving interstitial edema pattern. Suspect continued mild interstitial edema and probable small effusions. Electronically Signed   By: Rolm Baptise M.D.   On: 07/28/2018 16:44    Pending Labs Unresulted Labs (From admission, onward)    Start     Ordered   07/28/18 1603  Urine culture  Once,   STAT     07/28/18 1603   07/28/18 1603  Culture, blood (routine x 2)  BLOOD CULTURE X 2,   STAT     07/28/18 1603   Signed and Held  HIV antibody (Routine Screening)  Once,   R     Signed and Held   Signed and Held  CBC  (enoxaparin (LOVENOX)    CrCl >/= 30 ml/min)  Once,   R    Comments: Baseline for enoxaparin therapy IF NOT ALREADY DRAWN.  Notify MD if PLT < 100 K.    Signed and Held   Signed and Held  Creatinine, serum  (enoxaparin (LOVENOX)    CrCl >/= 30 ml/min)  Once,   R    Comments: Baseline for enoxaparin therapy IF NOT ALREADY DRAWN.    Signed and Held   Signed and Held  Creatinine, serum  (enoxaparin (LOVENOX)    CrCl >/= 30 ml/min)  Weekly,   R    Comments: while on enoxaparin therapy    Signed and Held   Signed and Held  Basic metabolic panel  Tomorrow morning,   R     Signed and Held   Signed and Held  CBC  Tomorrow morning,   R     Signed and Held   Signed and Held  Sputum culture  (Non-severe pneumonia (non-ICU care) in adult without resistant organism risk factors.)  Once,   R    Question:  Patient immune status  Answer:  Normal   Signed and Held          Vitals/Pain Today's Vitals   07/28/18 1829 07/28/18 1830 07/28/18 2000 07/28/18 2030  BP:  (!) 161/76 (!) 124/57 (!) 128/55  Pulse:  (!) 111  91 88  Resp:  (!) 27 (!) 21 19  Temp: (!) 101.5 F (38.6 C)   98.9 F (37.2 C)  TempSrc: Axillary   Oral  SpO2:  100% 96% 93%  Weight:      Height:      PainSc:        Isolation Precautions No active isolations  Medications Medications  azithromycin (ZITHROMAX) 500 mg in sodium chloride 0.9 % 250 mL IVPB (0 mg Intravenous Stopped 07/28/18 1954)  cefTRIAXone (ROCEPHIN) 1 g in sodium chloride 0.9 % 100 mL IVPB (0 g Intravenous Stopped 07/28/18 1954)  acetaminophen (TYLENOL) tablet 1,000 mg (1,000 mg Oral Given 07/28/18 1836)    Mobility walks with device High fall risk   Focused Assessments weakness, PT   R Recommendations: See Admitting Provider Note  Report given to:  Additional Notes:

## 2018-07-28 NOTE — ED Triage Notes (Signed)
Pt arrives via ems from home. Pt treated here for pnemonia few months ago, reports body aches today with low fever starting yesterday. Pt had tylenol at 1300, temp 100.3 @ home, ems states their temp was 99.4. hx. afib. ems reports pt fell twice today, denies hitting head or any LOC. Pt a&o x 4 on arrival NAD noted at this time  EMS vitals: RR 20, 92% ROOM, 96% 3L, BP 132/67  CBG 148

## 2018-07-29 ENCOUNTER — Other Ambulatory Visit: Payer: Self-pay | Admitting: Internal Medicine

## 2018-07-29 ENCOUNTER — Ambulatory Visit: Payer: Medicare HMO | Admitting: Family Medicine

## 2018-07-29 ENCOUNTER — Inpatient Hospital Stay: Admit: 2018-07-29 | Payer: Medicare HMO

## 2018-07-29 LAB — BASIC METABOLIC PANEL
Anion gap: 9 (ref 5–15)
BUN: 16 mg/dL (ref 8–23)
CO2: 24 mmol/L (ref 22–32)
Calcium: 8.7 mg/dL — ABNORMAL LOW (ref 8.9–10.3)
Chloride: 100 mmol/L (ref 98–111)
Creatinine, Ser: 0.9 mg/dL (ref 0.61–1.24)
GFR calc Af Amer: >60 mL/min (ref >60)
GFR calc non Af Amer: >60 mL/min (ref >60)
Glucose, Bld: 118 mg/dL — ABNORMAL HIGH (ref 70–99)
Potassium: 4.1 mmol/L (ref 3.5–5.1)
Sodium: 133 mmol/L — ABNORMAL LOW (ref 135–145)

## 2018-07-29 LAB — URINE CULTURE: Culture: <10000 — AB

## 2018-07-29 LAB — CBC
HCT: 38.6 % — ABNORMAL LOW (ref 39.0–52.0)
Hemoglobin: 13.1 g/dL (ref 13.0–17.0)
MCH: 30.8 pg (ref 26.0–34.0)
MCHC: 33.9 g/dL (ref 30.0–36.0)
MCV: 90.8 fL (ref 80.0–100.0)
Platelets: 174 10*3/uL (ref 150–400)
RBC: 4.25 MIL/uL (ref 4.22–5.81)
RDW: 15.9 % — ABNORMAL HIGH (ref 11.5–15.5)
WBC: 16.5 10*3/uL — ABNORMAL HIGH (ref 4.0–10.5)
nRBC: 0 % (ref 0.0–0.2)

## 2018-07-29 MED ORDER — SODIUM CHLORIDE 0.9 % IV SOLN
1.0000 g | INTRAVENOUS | Status: DC
  Administered 2018-07-29: 1 g via INTRAVENOUS
  Filled 2018-07-29: qty 10
  Filled 2018-07-29: qty 1

## 2018-07-29 MED ORDER — FUROSEMIDE 10 MG/ML IJ SOLN
60.0000 mg | Freq: Once | INTRAMUSCULAR | Status: AC
  Administered 2018-07-29: 16:00:00 60 mg via INTRAVENOUS
  Filled 2018-07-29: qty 6

## 2018-07-29 MED ORDER — SODIUM CHLORIDE 0.9 % IV SOLN
500.0000 mg | INTRAVENOUS | Status: DC
  Administered 2018-07-29: 500 mg via INTRAVENOUS
  Filled 2018-07-29 (×2): qty 500

## 2018-07-29 NOTE — Evaluation (Signed)
Physical Therapy Evaluation Patient Details Name: Tyler Stout MRN: [id number] DOB: 1925-02-09 Today's Date: 07/29/2018   History of Present Illness  presented to ER secondary to fall in home environment, progressive cough, SOB, weakness; admitted for management of CAP.  COVID negative.  PMH significant for PSP, afib, CABG, HTN  Clinical Impression  Upon evaluation, patient alert and oriented; follows commands with increased time for task initiation. Generally weak and deconditioned throughout extremities.  Reporting generalized soreness in R shoulder and R hip/thigh, unrated.  Currently requiring mod assist for bed mobility; mod assist for sit/stand, basic transfers and gait (10') with RW.  Demonstrates very short, shuffling steps; significant forward trunk flexion with mod WBing on RW (arms length anterior to patient).  Excessive forward lean/weight shift at times, mutiple anterior LOB requiring mod assist from therapist for balance recovery.  Heavy L lateral lean/weight shift with minimal self-righting reactions, mod antalgic gait pattern with L drop. Poor balance; heavy assist required at all times to prevent fall. Would benefit from skilled PT to address above deficits and promote optimal return to PLOF; recommend transition to STR upon discharge from acute hospitalization.       Follow Up Recommendations SNF    Equipment Recommendations       Recommendations for Other Services       Precautions / Restrictions Precautions Precautions: Fall Restrictions Weight Bearing Restrictions: No      Mobility  Bed Mobility Overal bed mobility: Needs Assistance Bed Mobility: Supine to Sit     Supine to sit: Mod assist        Transfers Overall transfer level: Needs assistance Equipment used: Rolling walker (2 wheeled) Transfers: Sit to/from Stand Sit to Stand: Mod assist         General transfer comment: significant physical assist for anterior weight translation, lift off  and overall balance  Ambulation/Gait Ambulation/Gait assistance: Mod assist Gait Distance (Feet): 10 Feet Assistive device: Rolling walker (2 wheeled)       General Gait Details: very short, shuffling steps; significant forward trunk flexion with mod WBing on RW (arms length anterior to patient).  Excessive forward lean/weight shift at times, mutiple anterior LOB requiring mod assist from therapist for balance recovery.  Heavy L lateral lean/weight shift with minimal self-righting reactions, mod antalgic gait pattern with L drop. Poor balance; heavy assist required at all times to prevent fall.  Stairs            Wheelchair Mobility    Modified Rankin (Stroke Patients Only)       Balance Overall balance assessment: Needs assistance Sitting-balance support: No upper extremity supported;Feet supported Sitting balance-Leahy Scale: Fair     Standing balance support: Bilateral upper extremity supported Standing balance-Leahy Scale: Poor                               Pertinent Vitals/Pain Pain Assessment: Faces Faces Pain Scale: Hurts little more Pain Location: R shoulder Pain Descriptors / Indicators: Aching;Grimacing;Guarding Pain Intervention(s): Limited activity within patient's tolerance;Monitored during session;Repositioned    Home Living Family/patient expects to be discharged to:: Private residence Living Arrangements: Spouse/significant other Available Help at Discharge: Family Type of Home: House Home Access: Ramped entrance     Home Layout: One level Home Equipment: Environmental consultant - 2 wheels;Cane - single point      Prior Function Level of Independence: Independent with assistive device(s)         Comments: Mod indep  with RW for household mobilization; doesn endorse 1-2 falls within previous 12 months     Hand Dominance        Extremity/Trunk Assessment   Upper Extremity Assessment Upper Extremity Assessment: Generalized weakness(R  shoulder elevation limited to shoulder height, all planes (chronic arthritic changes per patient))    Lower Extremity Assessment Lower Extremity Assessment: Generalized weakness       Communication      Cognition Arousal/Alertness: Awake/alert Behavior During Therapy: WFL for tasks assessed/performed Overall Cognitive Status: Within Functional Limits for tasks assessed                                        General Comments      Exercises     Assessment/Plan    PT Assessment Patient needs continued PT services  PT Problem List Decreased strength;Decreased range of motion;Decreased activity tolerance;Decreased balance;Decreased mobility;Decreased coordination;Decreased cognition;Decreased knowledge of use of DME;Decreased safety awareness;Decreased knowledge of precautions;Cardiopulmonary status limiting activity       PT Treatment Interventions DME instruction;Stair training;Therapeutic activities;Balance training;Cognitive remediation;Gait training;Functional mobility training;Therapeutic exercise;Neuromuscular re-education;Patient/family education    PT Goals (Current goals can be found in the Care Plan section)  Acute Rehab PT Goals Patient Stated Goal: to return home PT Goal Formulation: With patient Time For Goal Achievement: 08/12/18 Potential to Achieve Goals: Fair    Frequency Min 2X/week   Barriers to discharge Decreased caregiver support      Co-evaluation               AM-PAC PT "6 Clicks" Mobility  Outcome Measure Help needed turning from your back to your side while in a flat bed without using bedrails?: A Lot Help needed moving from lying on your back to sitting on the side of a flat bed without using bedrails?: A Lot Help needed moving to and from a bed to a chair (including a wheelchair)?: A Lot Help needed standing up from a chair using your arms (e.g., wheelchair or bedside chair)?: A Lot Help needed to walk in hospital  room?: A Lot Help needed climbing 3-5 steps with a railing? : Total 6 Click Score: 11    End of Session Equipment Utilized During Treatment: Gait belt Activity Tolerance: Patient limited by fatigue Patient left: in chair;with call bell/phone within reach;with chair alarm set Nurse Communication: Mobility status PT Visit Diagnosis: Muscle weakness (generalized) (M62.81);Difficulty in walking, not elsewhere classified (R26.2)    Time: 9191-6606 PT Time Calculation (min) (ACUTE ONLY): 24 min   Charges:   PT Evaluation $PT Eval Moderate Complexity: 1 Mod PT Treatments $Therapeutic Activity: 8-22 mins        Tyler Stout, PT, DPT, NCS 07/29/18, 11:19 PM [phone]

## 2018-07-29 NOTE — Evaluation (Signed)
Clinical/Bedside Swallow Evaluation Patient Details  Name: Tyler Stout MRN: [id number] Date of Birth: Nov 03, 1925  Today's Date: 07/29/2018 Time: SLP Start Time (ACUTE ONLY): 1250 SLP Stop Time (ACUTE ONLY): 1345 SLP Time Calculation (min) (ACUTE ONLY): 55 min  Past Medical History:  Past Medical History:  Diagnosis Date  . Anemia    iron deficiency  . Arthritis   . Atrial fibrillation ([place])   . Bilateral renal cysts    worked up by Dr Bernardo Heater  . BPH (benign prostatic hypertrophy)   . CAD (coronary artery disease)    s/p bypass graft x 4 (1991)  . Diverticulosis   . GERD (gastroesophageal reflux disease)   . Glaucoma   . Hypercholesterolemia   . Hypertension   . Pancreatitis    x2 (1994 and 1998), presumed to be gallstone pancreatitis.  s/p ERCP and sphincterotomy   Past Surgical History:  Past Surgical History:  Procedure Laterality Date  . APPENDECTOMY    . CHOLECYSTECTOMY  1994  . CORONARY ARTERY BYPASS GRAFT     x 4,  1991  . INGUINAL HERNIA REPAIR  2009  . open heart surgery  1991   HPI:  Pt is a 85 y.o. male with a known history of Progressive Supranuclear Palsy, GERD, atrial fibrillation, coronary artery disease with history of coronary artery bypass graft x4 as well as a history of hyperlipidemia.  He presented to the emergency room via EMS services from home where he lives with his wife.  He presented with complaints of increased weakness and shortness of breath having fallen twice over the last 2 days.  He is also complaining of low-grade fever at home.  Patient also notes a cough which has been productive of light yellow and clear mucus.  He denies chest pain.  He denies abdominal pain.  He denies diarrhea.  EMS reported oxygen saturation of 92% which increased to 95% on 2 L nasal cannula.  In the emergency room on arrival, he had oxygen saturations of 95% on room air.  Patient has a history of progressive supranuclear palsy with frequent falls and aspiration. He  was scheduled for swallow study today but was admitted instead.  Rapid COVID testing is negative.  Chest x-ray is suspicious for possible community-acquired pneumonia. He was started on IV Rocephin and azithromycin in the emergency room.  CXR: Improving interstitial edema pattern.  Pt is HOH aided bilateral.   Assessment / Plan / Recommendation Clinical Impression  Pt appeared to present w/ Mild+ oropharyngeal phase dysphagia w/ min increased risk for aspiration - suspect pt's swallowing presentation could be impacted by the Progressive Supranuclear Palsy baseline. Pt reports increased episodes of coughing at meals/with po's as well as decreased effort of speech. Pt is followed for the progressive supranuclear palsy. Pt required min+ setup of tray; he was able to feed himself. He conversed w/ SLP and followed instruction - noted his vocal quality was reduced in volume; mild+ Dysarthria(reduced coordinated, rapid lingual movements). With support, and following general aspiration precautions, pt consumed po trials of thi liquids, purees, and soft solids w/ no immediate, overt s/s of aspiration noted; however, noted inconsistent, delayed Throat Clearing post trials of thin liquids. No decline in vocal quality or respiratory effort during/post po trials followed but the throat clearing was notable. Oral phase appeared grossly [facility] for bolus acceptance, management, and oral clearing given time. Pt exhibited min less mastication effort w/ trials requiring increased time then slower A-P transfer and complete oral clearing post trials.  OM exam revealed no unilateral weakness in lingual/labial movements but slower, coodinated movements w/ generalized weakness. Pt fed self given min support for setup d/t overall weakness(min). Recommend f/u w/ objective swallow evaluation tomorrow - pt was unsure of need for such but after discussion he agreed stating it needed to be "early because I'm going home tomorrow". Recommend  current diet w/ strict aspiration precautions - no straws, cutting tougher meats for easier eating; thin liquids via cup. Recommend suport and monitoring at meals. Recommend Pills in Puree for safer swallowing. Reflux precautions. NSG updated.  SLP Visit Diagnosis: Dysphagia, oropharyngeal phase (R13.12)    Aspiration Risk  Mild aspiration risk;Risk for inadequate nutrition/hydration    Diet Recommendation  Dysphagia level 3 w/ thin liquids; no straw. Aspiration precautions; Reflux precautions.  Medication Administration: Whole meds with puree(for safer swallowing)    Other  Recommendations Recommended Consults: (Dietician f/u; Palliative care f/u) Oral Care Recommendations: Oral care BID;Patient independent with oral care;Staff/trained caregiver to provide oral care Other Recommendations: (n/a at this time)   Follow up Recommendations (TBD)      Frequency and Duration min 3x week(TBD)  2 weeks(TBD)       Prognosis Prognosis for Safe Diet Advancement: Fair Barriers to Reach Goals: Severity of deficits;Time post onset      Swallow Study   General Date of Onset: 07/28/18 HPI: Pt is a 85 y.o. male with a known history of Progressive Supranuclear Palsy, GERD, atrial fibrillation, coronary artery disease with history of coronary artery bypass graft x4 as well as a history of hyperlipidemia.  He presented to the emergency room via EMS services from home where he lives with his wife.  He presented with complaints of increased weakness and shortness of breath having fallen twice over the last 2 days.  He is also complaining of low-grade fever at home.  Patient also notes a cough which has been productive of light yellow and clear mucus.  He denies chest pain.  He denies abdominal pain.  He denies diarrhea.  EMS reported oxygen saturation of 92% which increased to 95% on 2 L nasal cannula.  In the emergency room on arrival, he had oxygen saturations of 95% on room air.  Patient has a history of  progressive supranuclear palsy with frequent falls and aspiration. He was scheduled for swallow study today but was admitted instead.  Rapid COVID testing is negative.  Chest x-ray is suspicious for possible community-acquired pneumonia. He was started on IV Rocephin and azithromycin in the emergency room.  CXR: Improving interstitial edema pattern.  Pt is HOH aided bilateral. Type of Study: Bedside Swallow Evaluation Previous Swallow Assessment: unknown - pt indicated he had a "test" last time I had bronchitis" Diet Prior to this Study: Regular;Thin liquids Temperature Spikes Noted: No(wbc elevated 16.5) Respiratory Status: Room air History of Recent Intubation: No Behavior/Cognition: Alert;Cooperative;Pleasant mood;Distractible;Requires cueing(min - HOH aided bilateral) Oral Cavity Assessment: Within Functional Limits Oral Care Completed by SLP: Recent completion by staff Oral Cavity - Dentition: Missing dentition Vision: Functional for self-feeding Self-Feeding Abilities: Able to feed self;Needs set up(min) Patient Positioning: Upright in chair Baseline Vocal Quality: Normal;Low vocal intensity(min) Volitional Cough: Strong;Congested(min) Volitional Swallow: Able to elicit    Oral/Motor/Sensory Function Overall Oral Motor/Sensory Function: Generalized oral weakness(no unilateral weakness noted; min+ Dysarthria ) Facial Symmetry: Within Functional Limits Lingual ROM: Within Functional Limits Lingual Symmetry: Within Functional Limits Lingual Strength: Reduced(min)   Ice Chips Ice chips: Within functional limits Presentation: Spoon(fed; 2 trials)   Thin  Liquid Thin Liquid: Impaired Presentation: Cup;Self Fed(~5-6 ozs total) Oral Phase Impairments: (none) Oral Phase Functional Implications: (none) Pharyngeal  Phase Impairments: Throat Clearing - Delayed(x2)    Nectar Thick Nectar Thick Liquid: Not tested   Honey Thick Honey Thick Liquid: Not tested   Puree Puree: Within functional  limits Presentation: Self Fed;Spoon(4 ozs)   Solid     Solid: Impaired Presentation: Spoon;Self Fed(6+ trials) Oral Phase Impairments: Impaired mastication;Reduced lingual movement/coordination(slower) Oral Phase Functional Implications: Prolonged oral transit;Impaired mastication Pharyngeal Phase Impairments: (none)        Orinda Kenner, MS, CCC-SLP Kayloni Rocco 07/29/2018,3:53 PM

## 2018-07-29 NOTE — Progress Notes (Signed)
[place] [facility] [place] NAME: Tyler Stout    MR#:  [id number]  DATE OF BIRTH:  11-26-1925  SUBJECTIVE:  CHIEF COMPLAINT:   Chief Complaint  Patient presents with  . Fever  . Fall  . Fatigue   Shortness of breath is improving.  REVIEW OF SYSTEMS:    Review of Systems  Constitutional: Negative for chills and fever.  HENT: Negative for sore throat.   Eyes: Negative for blurred vision, double vision and pain.  Respiratory: Positive for cough and shortness of breath. Negative for hemoptysis and wheezing.   Cardiovascular: Negative for chest pain, palpitations, orthopnea and leg swelling.  Gastrointestinal: Negative for abdominal pain, constipation, diarrhea, heartburn, nausea and vomiting.  Genitourinary: Negative for dysuria and hematuria.  Musculoskeletal: Negative for back pain and joint pain.  Skin: Negative for rash.  Neurological: Negative for sensory change, speech change, focal weakness and headaches.  Endo/Heme/Allergies: Does not bruise/bleed easily.  Psychiatric/Behavioral: Negative for depression. The patient is not nervous/anxious.     DRUG ALLERGIES:   Allergies  Allergen Reactions  . Ilosone [Erythromycin] Other (See Comments)    Reaction:  GI upset   . Penicillins Rash and Other (See Comments)    Has patient had a PCN reaction causing immediate rash, facial/tongue/throat swelling, SOB or lightheadedness with hypotension: No Has patient had a PCN reaction causing severe rash involving mucus membranes or skin necrosis: No Has patient had a PCN reaction that required hospitalization No Has patient had a PCN reaction occurring within the last 10 years: No If all of the above answers are "NO", then may proceed with Cephalosporin use.    VITALS:  Blood pressure (!) 145/66, pulse 93, temperature 98.1 F (36.7 C), temperature source Oral, resp. rate 18, height 5\' 5"  (1.651 m), weight 66 kg, SpO2 100 %.  PHYSICAL  EXAMINATION:   Physical Exam  GENERAL:  85 y.o.-year-old patient lying in the bed with no acute distress.  EYES: Pupils equal, round, reactive to light and accommodation. No scleral icterus. Extraocular muscles intact.  HEENT: Head atraumatic, normocephalic. Oropharynx and nasopharynx clear.  NECK:  Supple, no jugular venous distention. No thyroid enlargement, no tenderness.  LUNGS: Normal breath sounds bilaterally, no wheezing, rales, rhonchi. No use of accessory muscles of respiration.  CARDIOVASCULAR: S1, S2 normal. No murmurs, rubs, or gallops.  ABDOMEN: Soft, nontender, nondistended. Bowel sounds present. No organomegaly or mass.  EXTREMITIES: No cyanosis, clubbing or edema b/l.    NEUROLOGIC: Cranial nerves II through XII are intact. No focal Motor or sensory deficits b/l.   PSYCHIATRIC: The patient is alert and oriented x 3.  SKIN: No obvious rash, lesion, or ulcer.   LABORATORY PANEL:   CBC Recent Labs  Lab 07/29/18 0429  WBC 16.5*  HGB 13.1  HCT 38.6*  PLT 174   ------------------------------------------------------------------------------------------------------------------ Chemistries  Recent Labs  Lab 07/28/18 1603 07/29/18 0429  NA 131* 133*  K 4.1 4.1  CL 100 100  CO2 22 24  GLUCOSE 118* 118*  BUN 16 16  CREATININE 0.79 0.90  CALCIUM 8.6* 8.7*  AST 29  --   ALT 22  --   ALKPHOS 76  --   BILITOT 1.3*  --    ------------------------------------------------------------------------------------------------------------------  Cardiac Enzymes No results for input(s): TROPONINI in the last 168 hours. ------------------------------------------------------------------------------------------------------------------  RADIOLOGY:  Dg Chest Portable 1 View  Result Date: 07/28/2018 CLINICAL DATA:  Shortness of breath EXAM: PORTABLE CHEST 1 VIEW COMPARISON:  05/07/2018 FINDINGS: Cardiomegaly.  Prior CABG. Interstitial prominence throughout the lungs improved since  prior study, likely improving edema. Suspect small effusions. No acute bony abnormality. IMPRESSION: Fifth cardiomegaly. Improving interstitial edema pattern. Suspect continued mild interstitial edema and probable small effusions. Electronically Signed   By: Rolm Baptise M.D.   On: 07/28/2018 16:44     ASSESSMENT AND PLAN:   *Bilateral pneumonia.  On IV antibiotics.  Culture sent and pending. Oxygen as needed  *Acute on chronic diastolic congestive heart failure.  Pulmonary edema on chest x-ray.  Elevated BNP.  Will give 1 dose of IV Lasix.  Monitor input and output.  *Generalized weakness and falls.  Physical therapy evaluation.  * Supranuclear palsy With risk for aspiration Will consult speech therapy for swallowing evaluation Aspiration precautions  *  Atrial fibrillation Beta-blocker continued    All the records are reviewed and case discussed with Care Management/Social Worker Management plans discussed with the patient, family and they are in agreement.  CODE STATUS: Full code  DVT Prophylaxis: SCDs  TOTAL TIME TAKING CARE OF THIS PATIENT: 35 minutes.   POSSIBLE D/C IN 1-2 DAYS, DEPENDING ON CLINICAL CONDITION.  Neita Carp M.D on 07/29/2018 at 2:11 PM  Between 7am to 6pm - Pager - [phone]  After 6pm go to [email or web address] - password [email or web address] [facility]  Office  [phone]  CC: Primary care physician; [name], MD  Note: This dictation was prepared with Dragon dictation along with smaller phrase technology. Any transcriptional errors that result from this process are unintentional.

## 2018-07-29 NOTE — TOC Initial Note (Signed)
Transition of Care [facility]) - Initial/Assessment Note    Patient Details  Name: Tyler Stout MRN: [id number] Date of Birth: [birth year]  Transition of Care [facility]) CM/SW Contact:    Latanya Maudlin, RN Phone Number: 07/29/2018, 10:00 AM  Clinical Narrative:  [facility] team consulted to assist with disposition. Patient currently from home with his wife. Patient has a rolling walker, cane and grab bars throughout the home. Patient has recently completed home health but they both feel it could be beneficial. PT pending and they would be open to the idea of short term rehab if it is "the right thing to do". PCP is [facility]. [facility] and gets [facility] without issues. Spouse still drives. TOC team will follow pending PT recommendations.                 Expected Discharge Plan: [place] Barriers to Discharge: Continued Medical Work up   Patient Goals and CMS Choice   CMS Medicare.gov Compare Post Acute Care list provided to:: Patient Choice offered to / list presented to : Patient, Spouse  Expected Discharge Plan and Services Expected Discharge Plan: [place]   Discharge Planning Services: CM Consult Post Acute Care Choice: [place] arrangements for the past 2 months: Single Family Home Expected Discharge Date: 07/30/18                           [facility] Agency: [facility] Date [place]: 07/29/18 Time HH Agency Contacted: 1000 Representative spoke with at [place]: [place] Arrangements/Services Living arrangements for the past 2 months: [place] with:: Spouse Patient language and need for interpreter reviewed:: Yes Do you feel safe going back to the place where you live?: Yes      Need for Family Participation in Patient Care: No (Comment)   Current home services: DME Criminal Activity/Legal Involvement Pertinent to Current Situation/Hospitalization: No - Comment as needed  Activities  of Daily Living Home Assistive Devices/Equipment: Environmental consultant (specify type), Cane (specify quad or straight) ADL Screening (condition at time of admission) Patient's cognitive ability adequate to safely complete daily activities?: Yes Is the patient deaf or have difficulty hearing?: No Does the patient have difficulty seeing, even when wearing glasses/contacts?: No Does the patient have difficulty concentrating, remembering, or making decisions?: Yes Patient able to express need for assistance with ADLs?: Yes Does the patient have difficulty dressing or bathing?: Yes Independently performs ADLs?: No Communication: Independent Dressing (OT): Needs assistance Is this a change from baseline?: Pre-admission baseline Grooming: Independent Feeding: Independent Bathing: Needs assistance Is this a change from baseline?: Pre-admission baseline Toileting: Needs assistance Is this a change from baseline?: Pre-admission baseline In/Out Bed: Needs assistance Is this a change from baseline?: Pre-admission baseline Walks in Home: Needs assistance Is this a change from baseline?: Pre-admission baseline Does the patient have difficulty walking or climbing stairs?: Yes Weakness of Legs: Both Weakness of Arms/Hands: None  Permission Sought/Granted Permission sought to share information with : Case Manager                Emotional Assessment Appearance:: Appears stated age Attitude/Demeanor/Rapport: Engaged Affect (typically observed): Accepting Orientation: : Oriented to Self, Oriented to  Time, Oriented to Place      Admission diagnosis:  Weakness [R53.1] Patient Active Problem List   Diagnosis Date Noted  . Pneumonia 07/28/2018  . PNA (pneumonia) 05/07/2018  .  Cough 05/05/2018  . Hyperglycemia 09/13/2017  . Decreased pedal pulses 04/01/2017  . Dizziness 02/07/2017  . Progressive supranuclear palsy ([place]) 11/02/2016  . Thoracic aortic atherosclerosis ([place]) 08/12/2016  . Abnormal CXR  07/18/2016  . Urinary frequency 06/20/2015  . Lower GI bleed   . Demand ischemia ([place])   . Chronic atrial fibrillation   . Essential hypertension   . HLD (hyperlipidemia)   . BPH (benign prostatic hyperplasia)   . Tremor 06/10/2015  . Loss of weight 06/10/2015  . GI bleed 06/08/2015  . Health care maintenance 08/11/2014  . Light headedness 03/19/2014  . Back pain 03/13/2013  . Hypokalemia 02/20/2013  . CAD (coronary artery disease) 12/07/2011  . Hyponatremia 12/07/2011  . Atrial fibrillation ([place]) 12/07/2011  . Hypertension 11/20/2011  . Hypercholesteremia 11/20/2011  . Anemia 11/20/2011   PCP:  [name], MD Pharmacy:   [id number] [address], [place] [address] Phone: [phone] Fax: [place], [place] [address] Phone: [phone] Fax: [phone]     Social Determinants of Health (SDOH) Interventions    Readmission Risk Interventions Readmission Risk Prevention Plan 07/29/2018  Transportation Screening Complete  Home Care Screening Complete  Medication Review (RN CM) Complete  Some recent data might be hidden

## 2018-07-30 ENCOUNTER — Inpatient Hospital Stay (HOSPITAL_COMMUNITY)
Admit: 2018-07-30 | Discharge: 2018-07-30 | Disposition: A | Discharge status: Home / Self Care | Payer: Medicare HMO | Attending: Internal Medicine | Admitting: Internal Medicine

## 2018-07-30 ENCOUNTER — Inpatient Hospital Stay: Payer: Medicare HMO

## 2018-07-30 DIAGNOSIS — I340 Nonrheumatic mitral (valve) insufficiency: Secondary | ICD-10-CM

## 2018-07-30 DIAGNOSIS — I361 Nonrheumatic tricuspid (valve) insufficiency: Secondary | ICD-10-CM

## 2018-07-30 LAB — CBC WITH DIFFERENTIAL/PLATELET
Abs Immature Granulocytes: 0.03 10*3/uL (ref 0.00–0.07)
Basophils Absolute: 0 10*3/uL (ref 0.0–0.1)
Basophils Relative: 0 %
Eosinophils Absolute: 0.1 10*3/uL (ref 0.0–0.5)
Eosinophils Relative: 1 %
HCT: 37.1 % — ABNORMAL LOW (ref 39.0–52.0)
Hemoglobin: 12.7 g/dL — ABNORMAL LOW (ref 13.0–17.0)
Immature Granulocytes: 0 %
Lymphocytes Relative: 12 %
Lymphs Abs: 1.2 10*3/uL (ref 0.7–4.0)
MCH: 31.1 pg (ref 26.0–34.0)
MCHC: 34.2 g/dL (ref 30.0–36.0)
MCV: 90.7 fL (ref 80.0–100.0)
Monocytes Absolute: 1.1 10*3/uL — ABNORMAL HIGH (ref 0.1–1.0)
Monocytes Relative: 12 %
Neutro Abs: 7.1 10*3/uL (ref 1.7–7.7)
Neutrophils Relative %: 75 %
Platelets: 189 10*3/uL (ref 150–400)
RBC: 4.09 MIL/uL — ABNORMAL LOW (ref 4.22–5.81)
RDW: 15.4 % (ref 11.5–15.5)
WBC: 9.4 10*3/uL (ref 4.0–10.5)
nRBC: 0 % (ref 0.0–0.2)

## 2018-07-30 LAB — BASIC METABOLIC PANEL
Anion gap: 11 (ref 5–15)
BUN: 20 mg/dL (ref 8–23)
CO2: 26 mmol/L (ref 22–32)
Calcium: 8.4 mg/dL — ABNORMAL LOW (ref 8.9–10.3)
Chloride: 97 mmol/L — ABNORMAL LOW (ref 98–111)
Creatinine, Ser: 0.97 mg/dL (ref 0.61–1.24)
GFR calc Af Amer: >60 mL/min (ref >60)
GFR calc non Af Amer: >60 mL/min (ref >60)
Glucose, Bld: 102 mg/dL — ABNORMAL HIGH (ref 70–99)
Potassium: 3.5 mmol/L (ref 3.5–5.1)
Sodium: 134 mmol/L — ABNORMAL LOW (ref 135–145)

## 2018-07-30 LAB — ECHOCARDIOGRAM COMPLETE
Height: 65 in
Weight: 2328.06 oz

## 2018-07-30 LAB — HIV ANTIBODY (ROUTINE TESTING W REFLEX): HIV Screen 4th Generation wRfx: NONREACTIVE

## 2018-07-30 MED ORDER — CEFDINIR 300 MG PO CAPS
300.0000 mg | ORAL_CAPSULE | Freq: Two times a day (BID) | ORAL | Status: DC
  Filled 2018-07-30: qty 1

## 2018-07-30 MED ORDER — CITALOPRAM HYDROBROMIDE 20 MG PO TABS: 20.0000 mg | ORAL_TABLET | Freq: Every day | ORAL | Status: DC

## 2018-07-30 MED ORDER — AZITHROMYCIN 500 MG PO TABS: 500.0000 mg | ORAL_TABLET | Freq: Every day | ORAL | Status: DC

## 2018-07-30 MED ORDER — LEVOFLOXACIN 500 MG PO TABS: 500.0000 mg | ORAL_TABLET | Freq: Every day | ORAL | 0 refills | Status: AC

## 2018-07-30 NOTE — Evaluation (Signed)
Objective Swallowing Evaluation: Type of Study: Bedside Swallow Evaluation   Patient Details  Name: Tyler Stout MRN: [id number] Date of Birth: 1925/08/05  Today's Date: 07/30/2018 Time: SLP Start Time (ACUTE ONLY): 1200 -SLP Stop Time (ACUTE ONLY): 1300  SLP Time Calculation (min) (ACUTE ONLY): 60 min   Past Medical History:  Past Medical History:  Diagnosis Date  . Anemia    iron deficiency  . Arthritis   . Atrial fibrillation ([place])   . Bilateral renal cysts    worked up by Dr Bernardo Heater  . BPH (benign prostatic hypertrophy)   . CAD (coronary artery disease)    s/p bypass graft x 4 (1991)  . Diverticulosis   . GERD (gastroesophageal reflux disease)   . Glaucoma   . Hypercholesterolemia   . Hypertension   . Pancreatitis    x2 (1994 and 1998), presumed to be gallstone pancreatitis.  s/p ERCP and sphincterotomy   Past Surgical History:  Past Surgical History:  Procedure Laterality Date  . APPENDECTOMY    . CHOLECYSTECTOMY  1994  . CORONARY ARTERY BYPASS GRAFT     x 4,  1991  . INGUINAL HERNIA REPAIR  2009  . open heart surgery  1991   HPI: Pt is a 85 y.o. male with a known history of Progressive Supranuclear Palsy, GERD, atrial fibrillation, coronary artery disease with history of coronary artery bypass graft x4 as well as a history of hyperlipidemia.  He presented to the emergency room via EMS services from home where he lives with his wife.  He presented with complaints of increased weakness and shortness of breath having fallen twice over the last 2 days.  He is also complaining of low-grade fever at home.  Patient also notes a cough which has been productive of light yellow and clear mucus.  He denies chest pain.  He denies abdominal pain.  He denies diarrhea.  EMS reported oxygen saturation of 92% which increased to 95% on 2 L nasal cannula.  In the emergency room on arrival, he had oxygen saturations of 95% on room air.  Patient has a history of progressive supranuclear  palsy with frequent falls and aspiration. He was scheduled for swallow study today but was admitted instead.  Rapid COVID testing is negative.  Chest x-ray is suspicious for possible community-acquired pneumonia. He was started on IV Rocephin and azithromycin in the emergency room.  CXR: Improving interstitial edema pattern.  Pt is HOH aided bilateral.   Subjective: pt awake, alert and verbally conversive w/ SLP and staff. Good sense of humor. Volume of speech remains low. He is HOH; aided bilaterally.    Assessment / Plan / Recommendation  CHL IP CLINICAL IMPRESSIONS 07/30/2018  Clinical Impression Pt appeared to present w/ a functional oropharyngeal swallow w/ No gross oropharyngeal phase dysphagia noted during this study today. Pt appears at reduced risk for aspiration and Pulmonary decline when following general aspiration precautions and utilizing general strategies to include a f/u, DRY swallow to clear any oropharyngeal residue remaining post bites/sips.  During the oral phase, pt exhibited min increased oral phase time for full mastication of increased textured trials; min increased lingual sweeping for full oral clearing post trials of solids secondary to slight-min oral residue. Pt is missing few dentition. Given time, pt cleared orally b/t trials. Suspect d/t overall, generalized weakness, dry mouth, and pt's baseline of Supranucle.ar Palsy. During the pharyngeal phase, pt exhibited min delay in pharygneal swallow initiation but w/ respect to his age(89y), the pharyngeal  swallow initiation appeared grossly wnl. Liquids and some puree trials tended to fill the Valleculae as initiation of swallow occurred. Noted tight airway closure w/ full laryngeal excursion and epiglottic inversion noted during the swallows. Pt had a deep Valleculae which appeared to trap min liquid residue post trials. This, along w/ slight amount of pyriform sinus residue intermittently, was cleared when pt utilized a repeat,  DRY swallow b/t trials. NO aspiration or laryngeal penetration occurred during this study today.  During the Esophageal phase, no bolus dysmotility noted w/ trials presented; min irregular cervical Esophagus vertabrae noted. Pt helped to feed self w/ setup; he followed all instructions.  MD updated post study.   SLP Visit Diagnosis Dysphagia, unspecified (R13.10)  Attention and concentration deficit following --  Frontal lobe and executive function deficit following --  Impact on safety and function Mild aspiration risk but reduced following precautions      CHL IP TREATMENT RECOMMENDATION 07/30/2018  Treatment Recommendations No treatment recommended at this time; education as needed     Prognosis 07/30/2018  Prognosis for Safe Diet Advancement Fair-Good  Barriers to Reach Goals Time post onset;Severity of deficits  Barriers/Prognosis Comment Supranuclear palsy baseline    CHL IP DIET RECOMMENDATION 07/30/2018  SLP Diet Recommendations Dysphagia 3 (Mech soft) solids;Thin liquid - conservation of energy  Liquid Administration via Cup;No straw  Medication Administration Whole meds with puree  Compensations Minimize environmental distractions;Slow rate;Small sips/bites;Lingual sweep for clearance of pocketing;Multiple dry swallows after each bite/sip;Follow solids with liquid  Postural Changes Remain semi-upright after after feeds/meals (Comment);Seated upright at 90 degrees      CHL IP OTHER RECOMMENDATIONS 07/30/2018  Recommended Consults (No Data)  Oral Care Recommendations Oral care BID;Staff/trained caregiver to provide oral care  Other Recommendations (No Data)      CHL IP FOLLOW UP RECOMMENDATIONS 07/30/2018  Follow up Recommendations None      CHL IP FREQUENCY AND DURATION 07/30/2018  Speech Therapy Frequency (ACUTE ONLY) (No Data)  Treatment Duration (No Data)           CHL IP ORAL PHASE 07/30/2018  Oral Phase Impaired - min  Oral - Pudding Teaspoon --  Oral - Pudding Cup  --  Oral - Honey Teaspoon --  Oral - Honey Cup --  Oral - Nectar Teaspoon --  Oral - Nectar Cup 2  Oral - Nectar Straw --  Oral - Thin Teaspoon --  Oral - Thin Cup 8  Oral - Thin Straw --  Oral - Puree 3  Oral - Mech Soft 2  Oral - Regular --  Oral - Multi-Consistency --  Oral - Pill NT  Oral Phase - Comment pt exhibited min increased oral phase time for full mastication of increased textured trials; min increased lingual sweeping for full oral clearing post trials of solids secondary to slight-min oral residue. Pt is missing few dentition. Given time, pt cleared orally b/t trials. Suspect d/t overall, generalized weakness, dry mouth, and pt's baseline of Supranucle.ar Palsy.     CHL IP PHARYNGEAL PHASE 07/30/2018  Pharyngeal Phase Impaired  Pharyngeal- Pudding Teaspoon --  Pharyngeal --  Pharyngeal- Pudding Cup --  Pharyngeal --  Pharyngeal- Honey Teaspoon --  Pharyngeal --  Pharyngeal- Honey Cup --  Pharyngeal --  Pharyngeal- Nectar Teaspoon --  Pharyngeal --  Pharyngeal- Nectar Cup 2  Pharyngeal --  Pharyngeal- Nectar Straw --  Pharyngeal --  Pharyngeal- Thin Teaspoon --  Pharyngeal --  Pharyngeal- Thin Cup 8  Pharyngeal --  Pharyngeal- Thin  Straw --  Pharyngeal --  Pharyngeal- Puree 3  Pharyngeal --  Pharyngeal- Mechanical Soft 2  Pharyngeal --  Pharyngeal- Regular --  Pharyngeal --  Pharyngeal- Multi-consistency --  Pharyngeal --  Pharyngeal- Pill NT  Pharyngeal --  Pharyngeal Comment pt exhibited min delay in pharygneal swallow initiation but w/ respect to his age(89y), the pharyngeal swallow initiation appeared grossly wnl. Liquids and some puree trials tended to fill the Valleculae as initiation of swallow occurred. Noted tight airway closure w/ full laryngeal excursion and epiglottic inversion. Pt had a deep Valleculae which appeared to trap min liquid residue post trials. This, along w/ slight amount of pyriform sinus residue intermittently, was cleared when  pt utilized a repeat, DRY swallow b/t trials.  NO aspiration or laryngeal penetration occurred during this study today.      CHL IP CERVICAL ESOPHAGEAL PHASE 07/30/2018  Cervical Esophageal Phase WFL  Pudding Teaspoon --  Pudding Cup --  Honey Teaspoon --  Honey Cup --  Nectar Teaspoon --  Nectar Cup 2  Nectar Straw --  Thin Teaspoon --  Thin Cup 8  Thin Straw --  Puree 3  Mechanical Soft 2  Regular --  Multi-consistency --  Pill NT  Cervical Esophageal Comment Min irregular cervical esophagus vertabrae         Orinda Kenner, MS, CCC-SLP , 07/30/2018, 2:32 PM

## 2018-07-30 NOTE — Progress Notes (Signed)
*  PRELIMINARY RESULTS* Echocardiogram 2D Echocardiogram has been performed.  Tyler Stout 07/30/2018, 12:11 PM

## 2018-07-30 NOTE — Care Management Important Message (Signed)
Important Message  Patient Details  Name: Tyler Stout MRN: [id number] Date of Birth: 26-Apr-1925   Medicare Important Message Given:  Yes     Juliann Pulse A Lemar Bakos 07/30/2018, 11:19 AM

## 2018-08-02 ENCOUNTER — Telehealth: Payer: Self-pay | Admitting: Internal Medicine

## 2018-08-02 LAB — CULTURE, BLOOD (ROUTINE X 2)
Culture: NO GROWTH
Culture: NO GROWTH

## 2018-08-02 NOTE — Telephone Encounter (Signed)
Transition Care Management Follow-up Telephone Call  How have you been since you were released from the hospital? Patient per wife is "doing fine since DC eating and walking with walker, doing well as expected."   Do you understand why you were in the hospital? yes   Do you understand the discharge instrcutions? yes  Items Reviewed:  Medications reviewed: yes  Allergies reviewed: yes  Dietary changes reviewed: yes  Referrals reviewed: yes   Functional Questionnaire:   Activities of Daily Living (ADLs):   He states they are independent in the following: feeding, continence, grooming, toileting and dressing States they require assistance with the following: ambulation and bathing and hygiene   Any transportation issues/concerns?: no   Any patient concerns? no   Confirmed importance and date/time of follow-up visits scheduled: yes   Confirmed with patient if condition begins to worsen call PCP or go to the ER.  Patient was given the Call-a-Nurse line [phone]: yes

## 2018-08-03 ENCOUNTER — Ambulatory Visit (INDEPENDENT_AMBULATORY_CARE_PROVIDER_SITE_OTHER): Payer: Medicare HMO | Admitting: Internal Medicine

## 2018-08-03 ENCOUNTER — Encounter: Payer: Self-pay | Admitting: Internal Medicine

## 2018-08-03 DIAGNOSIS — I2510 Atherosclerotic heart disease of native coronary artery without angina pectoris: Secondary | ICD-10-CM | POA: Diagnosis not present

## 2018-08-03 DIAGNOSIS — D508 Other iron deficiency anemias: Secondary | ICD-10-CM

## 2018-08-03 DIAGNOSIS — R9389 Abnormal findings on diagnostic imaging of other specified body structures: Secondary | ICD-10-CM

## 2018-08-03 DIAGNOSIS — G231 Progressive supranuclear ophthalmoplegia [Steele-Richardson-Olszewski]: Secondary | ICD-10-CM

## 2018-08-03 DIAGNOSIS — J189 Pneumonia, unspecified organism: Secondary | ICD-10-CM

## 2018-08-03 DIAGNOSIS — I10 Essential (primary) hypertension: Secondary | ICD-10-CM | POA: Diagnosis not present

## 2018-08-03 DIAGNOSIS — I4891 Unspecified atrial fibrillation: Secondary | ICD-10-CM

## 2018-08-03 DIAGNOSIS — R42 Dizziness and giddiness: Secondary | ICD-10-CM

## 2018-08-03 DIAGNOSIS — I700 Atherosclerosis of aorta: Secondary | ICD-10-CM | POA: Diagnosis not present

## 2018-08-03 NOTE — Progress Notes (Signed)
[name]: Tyler Stout, male   DOB: [birth year], 85 y.o.   MRN: [id number]   Virtual Visit via telephone Note  This visit type was conducted due to national recommendations for restrictions regarding the COVID-19 pandemic (e.g. social distancing).  This format is felt to be most appropriate for this patient at this time.  All issues noted in this document were discussed and addressed.  No physical exam was performed (except for noted visual exam findings with Video Visits).   I connected with Tyler Stout by telephone and verified that I am speaking with the correct person using two identifiers. Location patient: home Location provider: work Persons participating in the telephone visit: patient, provider and pts wife Tyler Stout  I discussed the limitations, risks, security and privacy concerns of performing an evaluation and management service by telephone and the availability of in person appointments.  The patient expressed understanding and agreed to proceed.   Reason for visit: hospital follow up.   HPI: He was admitted 07/28/18 with increased weakness, sob and fell 2x prior to coming in to ER.  Reported fever and cough - productive of light yellow mucus.  No chest pain.  Has a history of supranuclear palsy.  Per note, had swallowing evaluation in hospital.  Pt reports swallowing evaluation ok.  COVID negative.  MD in hospital felt cxr suspicious for CAP.  Was started on IV rocephin and azithromycin.  Duonebs.  PT and ST consulted.  Was discharged home on levaquin.  States since being home, has improved.  Not as weak.  No further falls.  Eating.  Still some congestion - nose.  No significant chest congestion.  No nausea or vomiting.  No abdominal pain.     ROS: See pertinent positives and negatives per HPI.  Past Medical History:  Diagnosis Date  . Anemia    iron deficiency  . Arthritis   . Atrial fibrillation ([place])   . Bilateral renal cysts    worked up by Dr Bernardo Heater  . BPH (benign  prostatic hypertrophy)   . CAD (coronary artery disease)    s/p bypass graft x 4 (1991)  . Diverticulosis   . GERD (gastroesophageal reflux disease)   . Glaucoma   . Hypercholesterolemia   . Hypertension   . Pancreatitis    x2 (1994 and 1998), presumed to be gallstone pancreatitis.  s/p ERCP and sphincterotomy    Past Surgical History:  Procedure Laterality Date  . APPENDECTOMY    . CHOLECYSTECTOMY  1994  . CORONARY ARTERY BYPASS GRAFT     x 4,  1991  . INGUINAL HERNIA REPAIR  2009  . open heart surgery  1991    Family History  Problem Relation Age of Onset  . Throat cancer Father        oropharyngeal cancer  . Kidney disease Sister   . Heart disease Other   . Colon cancer Neg Hx   . Prostate cancer Neg Hx     SOCIAL HX: reviewed.    Current Outpatient Medications:  .  amLODipine (NORVASC) 5 MG tablet, TAKE 1 TABLET (5 MG TOTAL) BY MOUTH DAILY., Disp: 90 tablet, Rfl: 1 .  Ascorbic Acid (VITAMIN C PO), Take 1 tablet by mouth daily. Reported on 06/20/2015, Disp: , Rfl:  .  atorvastatin (LIPITOR) 20 MG tablet, TAKE 1 TABLET (20 MG TOTAL) BY MOUTH DAILY., Disp: 90 tablet, Rfl: 3 .  azelastine (ASTELIN) 0.1 % nasal spray, PLACE 1 SPRAY INTO BOTH NOSTRILS 2 (TWO) TIMES DAILY,  Disp: 30 mL, Rfl: 1 .  BREO ELLIPTA 200-25 MCG/INH AEPB, TAKE 1 PUFF BY MOUTH EVERY DAY, Disp: , Rfl:  .  carbidopa-levodopa (SINEMET IR) 25-100 MG tablet, TAKE 2 TABLETS BY MOUTH 3 TIMES DAILY, Disp: , Rfl: 2 .  citalopram (CELEXA) 20 MG tablet, TAKE 1 TABLET (20 MG TOTAL) BY MOUTH DAILY., Disp: 90 tablet, Rfl: 2 .  fluticasone (FLONASE) 50 MCG/ACT nasal spray, SPRAY 2 SPRAYS INTO EACH NOSTRIL EVERY DAY, Disp: 48 g, Rfl: 1 .  furosemide (LASIX) 20 MG tablet, Take 20 mg by mouth daily. , Disp: , Rfl:  .  latanoprost (XALATAN) 0.005 % ophthalmic solution, Place 1 drop into both eyes at bedtime. , Disp: , Rfl:  .  metoprolol tartrate (LOPRESSOR) 25 MG tablet, TAKE 1 TABLET BY MOUTH TWICE A DAY, Disp: , Rfl:   .  Multiple Vitamin (MULTIVITAMIN WITH MINERALS) TABS tablet, Take 1 tablet by mouth daily., Disp: , Rfl:  .  pantoprazole (PROTONIX) 40 MG tablet, TAKE 1 TABLET BY MOUTH DAILY, Disp: 90 tablet, Rfl: 3 .  tamsulosin (FLOMAX) 0.4 MG CAPS capsule, TAKE 1 CAPSULE (0.4 MG TOTAL) BY MOUTH DAILY., Disp: 90 capsule, Rfl: 1  EXAM:  GENERAL: alert.  Sounds to be in no acute distress. Answering questions appropriately.    PSYCH/NEURO: pleasant and cooperative, no obvious depression or anxiety, speech and thought processing grossly intact  ASSESSMENT AND PLAN:  Discussed the following assessment and plan:  Abnormal CXR CXR on recent admission read as mild interstitial edema with small pleural effusion.  Interstitial edema improved.  Treated for infection in hospital.  Symptoms improved.  Breathing improved.  Continue current medication regimen.  Keep f/u with cardiology.    Anemia Has had GI w/up.  Follow cbc.  hgb 07/30/18 - 12.7.  Follow.   Atrial fibrillation Followed by cardiology.  Off coumadin secondary to GI bleed.  Stable.    CAD (coronary artery disease) Follow up with cardiology.  Stable.   Dizziness Chronic problem for him.  Has had extensive w/up.  Multifactorial.  Has had progressive supranuclear palsy.  Previously - orthostatic.  Continue compression hose.  Slow position changes and movements.  Use walker.  Continue f/u with neurology.   Essential hypertension Blood pressure has been doing well.  Follow pressures.  Follow metabolic panel.   PNA (pneumonia) Recently hospitalized as outlined.  Treated for pneumonia.  cxr as outlined.  Doing better.  Breathing better.  Keep f/u with cardiology.  Completed levaquin.  Obtain results of swallowing evaluation.    Progressive supranuclear palsy ([place]) Followed by neurology.    Thoracic aortic atherosclerosis ([facility]) Lipitor.      I discussed the assessment and treatment plan with the patient. The patient was provided an opportunity  to ask questions and all were answered. The patient agreed with the plan and demonstrated an understanding of the instructions.   The patient was advised to call back or seek an in-person evaluation if the symptoms worsen or if the condition fails to improve as anticipated.  I provided 25 minutes of non-face-to-face time during this encounter.   [name], MD

## 2018-08-06 DIAGNOSIS — H02056 Trichiasis without entropian left eye, unspecified eyelid: Secondary | ICD-10-CM | POA: Diagnosis not present

## 2018-08-06 DIAGNOSIS — B0052 Herpesviral keratitis: Secondary | ICD-10-CM | POA: Diagnosis not present

## 2018-08-08 ENCOUNTER — Encounter: Payer: Self-pay | Admitting: Internal Medicine

## 2018-08-08 NOTE — Assessment & Plan Note (Signed)
Followed by neurology.   

## 2018-08-08 NOTE — Assessment & Plan Note (Signed)
CXR on recent admission read as mild interstitial edema with small pleural effusion.  Interstitial edema improved.  Treated for infection in hospital.  Symptoms improved.  Breathing improved.  Continue current medication regimen.  Keep f/u with cardiology.

## 2018-08-08 NOTE — Assessment & Plan Note (Signed)
Recently hospitalized as outlined.  Treated for pneumonia.  cxr as outlined.  Doing better.  Breathing better.  Keep f/u with cardiology.  Completed levaquin.  Obtain results of swallowing evaluation.

## 2018-08-08 NOTE — Assessment & Plan Note (Signed)
Followed by cardiology.  Off coumadin secondary to GI bleed.  Stable.

## 2018-08-08 NOTE — Assessment & Plan Note (Signed)
Follow up with cardiology.  Stable.

## 2018-08-08 NOTE — Assessment & Plan Note (Signed)
Chronic problem for him.  Has had extensive w/up.  Multifactorial.  Has had progressive supranuclear palsy.  Previously - orthostatic.  Continue compression hose.  Slow position changes and movements.  Use walker.  Continue f/u with neurology.

## 2018-08-08 NOTE — Assessment & Plan Note (Signed)
Blood pressure has been doing well.  Follow pressures.  Follow metabolic panel.   

## 2018-08-08 NOTE — Assessment & Plan Note (Signed)
Lipitor 

## 2018-08-08 NOTE — Assessment & Plan Note (Signed)
Has had GI w/up.  Follow cbc.  hgb 07/30/18 - 12.7.  Follow.

## 2018-08-09 DIAGNOSIS — M1990 Unspecified osteoarthritis, unspecified site: Secondary | ICD-10-CM | POA: Diagnosis not present

## 2018-08-09 DIAGNOSIS — I2510 Atherosclerotic heart disease of native coronary artery without angina pectoris: Secondary | ICD-10-CM | POA: Diagnosis not present

## 2018-08-09 DIAGNOSIS — I10 Essential (primary) hypertension: Secondary | ICD-10-CM | POA: Diagnosis not present

## 2018-08-09 DIAGNOSIS — G231 Progressive supranuclear ophthalmoplegia [Steele-Richardson-Olszewski]: Secondary | ICD-10-CM | POA: Diagnosis not present

## 2018-08-09 DIAGNOSIS — N400 Enlarged prostate without lower urinary tract symptoms: Secondary | ICD-10-CM | POA: Diagnosis not present

## 2018-08-09 DIAGNOSIS — K5790 Diverticulosis of intestine, part unspecified, without perforation or abscess without bleeding: Secondary | ICD-10-CM | POA: Diagnosis not present

## 2018-08-09 DIAGNOSIS — J189 Pneumonia, unspecified organism: Secondary | ICD-10-CM | POA: Diagnosis not present

## 2018-08-09 DIAGNOSIS — D509 Iron deficiency anemia, unspecified: Secondary | ICD-10-CM | POA: Diagnosis not present

## 2018-08-09 DIAGNOSIS — M545 Low back pain: Secondary | ICD-10-CM | POA: Diagnosis not present

## 2018-08-09 DIAGNOSIS — I482 Chronic atrial fibrillation, unspecified: Secondary | ICD-10-CM | POA: Diagnosis not present

## 2018-08-09 NOTE — Addendum Note (Signed)
Addended by: Alisa Graff on: 08/09/2018 09:14 PM   Modules accepted: Level of Service

## 2018-08-10 ENCOUNTER — Telehealth: Payer: Self-pay | Admitting: Internal Medicine

## 2018-08-10 NOTE — Discharge Summary (Signed)
[facility] NAME: Tyler Stout    MR#:  [id number]  DATE OF BIRTH:  [birth year]  DATE OF ADMISSION:  07/28/2018 ADMITTING PHYSICIAN: Gwynneth Aliment, MD  DATE OF DISCHARGE: 07/30/2018  3:00 PM  PRIMARY CARE PHYSICIAN: [name], MD   ADMISSION DIAGNOSIS:  Weakness [R53.1]  DISCHARGE DIAGNOSIS:  Active Problems:   Pneumonia  SECONDARY DIAGNOSIS:   Past Medical History:  Diagnosis Date  . Anemia    iron deficiency  . Arthritis   . Atrial fibrillation ([place])   . Bilateral renal cysts    worked up by Dr Bernardo Heater  . BPH (benign prostatic hypertrophy)   . CAD (coronary artery disease)    s/p bypass graft x 4 (1991)  . Diverticulosis   . GERD (gastroesophageal reflux disease)   . Glaucoma   . Hypercholesterolemia   . Hypertension   . Pancreatitis    x2 (1994 and 1998), presumed to be gallstone pancreatitis.  s/p ERCP and sphincterotomy     ADMITTING HISTORY  HISTORY OF PRESENT ILLNESS:  Tyler Stout  is a 85 y.o. male with a known history of atrial fibrillation, coronary artery disease with history of coronary artery bypass graft x4 as well as a history of hyperlipidemia.  He presented to the emergency room via EMS services from home where he lives with his wife.  He presented with complaints of increased weakness and shortness of breath having fallen twice over the last 2 days.  He is also complaining of low-grade fever at home.  Patient also notes a cough which has been productive of light yellow and clear mucus.  He denies chest pain.  He denies abdominal pain.  He denies diarrhea.  EMS reported oxygen saturation of 92% which increased to 95% on 2 L nasal cannula.  In the emergency room on arrival, he had oxygen saturations of 95% on room air.  Patient has a history of progressive supranuclear palsy with frequent falls and aspiration.  He was scheduled for swallow study today.  This has been ordered with speech  consultation during this hospitalization as patient has required admission.  Rapid COVID testing is negative.  Chest x-ray is suspicious for possible community-acquired pneumonia.  He was started on IV Rocephin and azithromycin in the emergency room.  This will be continued.  He has been admitted to the hospitalist service for further management.   HOSPITAL COURSE:   *Bilateral pneumonia.   Started on IV ceftriaxone and azithromycin in the hospital.  Switch to oral antibiotics for 5 more days after discharge Afebrile and normal WBC  *Acute on chronic diastolic congestive heart failure.  Pulmonary edema on chest x-ray.  Elevated BNP.   Treated with IV Lasix and improved well.  Oral Lasix at discharge  *Generalized weakness and falls.  Physical therapy evaluation. Home health physical therapy and nursing set up.  *Supranuclear palsy With risk for aspiration Speech therapy consulted.  Dysphagia diet started. Aspiration precautions given at discharge  * Atrial fibrillation Beta-blocker continued  Stable for discharge home  CONSULTS OBTAINED:   Speech therapy  DRUG ALLERGIES:   Allergies  Allergen Reactions  . Ilosone [Erythromycin] Other (See Comments)    Reaction:  GI upset   . Penicillins Rash and Other (See Comments)    Has patient had a PCN reaction causing immediate rash, facial/tongue/throat swelling, SOB or lightheadedness with hypotension: No Has patient had a PCN reaction causing severe rash involving mucus membranes or skin  necrosis: No Has patient had a PCN reaction that required hospitalization No Has patient had a PCN reaction occurring within the last 10 years: No If all of the above answers are "NO", then may proceed with Cephalosporin use.    DISCHARGE MEDICATIONS:   Allergies as of 07/30/2018      Reactions   Ilosone [erythromycin] Other (See Comments)   Reaction:  GI upset    Penicillins Rash, Other (See Comments)   Has patient had a PCN  reaction causing immediate rash, facial/tongue/throat swelling, SOB or lightheadedness with hypotension: No Has patient had a PCN reaction causing severe rash involving mucus membranes or skin necrosis: No Has patient had a PCN reaction that required hospitalization No Has patient had a PCN reaction occurring within the last 10 years: No If all of the above answers are "NO", then may proceed with Cephalosporin use.      Medication List    TAKE these medications   amLODipine 5 MG tablet Commonly known as: NORVASC TAKE 1 TABLET (5 MG TOTAL) BY MOUTH DAILY.   atorvastatin 20 MG tablet Commonly known as: LIPITOR TAKE 1 TABLET (20 MG TOTAL) BY MOUTH DAILY.   azelastine 0.1 % nasal spray Commonly known as: ASTELIN PLACE 1 SPRAY INTO BOTH NOSTRILS 2 (TWO) TIMES DAILY   Breo Ellipta 200-25 MCG/INH Aepb Generic drug: fluticasone furoate-vilanterol TAKE 1 PUFF BY MOUTH EVERY DAY   carbidopa-levodopa 25-100 MG tablet Commonly known as: SINEMET IR TAKE 2 TABLETS BY MOUTH 3 TIMES DAILY   citalopram 20 MG tablet Commonly known as: CELEXA TAKE 1 TABLET (20 MG TOTAL) BY MOUTH DAILY. What changed: See the new instructions.   fluticasone 50 MCG/ACT nasal spray Commonly known as: FLONASE SPRAY 2 SPRAYS INTO EACH NOSTRIL EVERY DAY   furosemide 20 MG tablet Commonly known as: LASIX Take 20 mg by mouth daily.   metoprolol tartrate 25 MG tablet Commonly known as: LOPRESSOR TAKE 1 TABLET BY MOUTH TWICE A DAY   multivitamin with minerals Tabs tablet Take 1 tablet by mouth daily.   pantoprazole 40 MG tablet Commonly known as: PROTONIX TAKE 1 TABLET BY MOUTH DAILY   tamsulosin 0.4 MG Caps capsule Commonly known as: FLOMAX TAKE 1 CAPSULE (0.4 MG TOTAL) BY MOUTH DAILY.   VITAMIN C PO Take 1 tablet by mouth daily. Reported on 06/20/2015   Xalatan 0.005 % ophthalmic solution Generic drug: latanoprost Place 1 drop into both eyes at bedtime.     ASK your doctor about these medications    levofloxacin 500 MG tablet Commonly known as: Levaquin Take 1 tablet (500 mg total) by mouth daily for 4 days. Ask about: Should I take this medication?       Today   VITAL SIGNS:  Blood pressure (!) 146/66, pulse 88, temperature 97.9 F (36.6 C), temperature source Oral, resp. rate 18, height 5\' 5"  (1.651 m), weight 66 kg, SpO2 94 %.  I/O:  No intake or output data in the 24 hours ending 08/10/18 1543  PHYSICAL EXAMINATION:  Physical Exam  GENERAL:  85 y.o.-year-old patient lying in the bed with no acute distress.  LUNGS: Normal breath sounds bilaterally, no wheezing, rales,rhonchi or crepitation. No use of accessory muscles of respiration.  CARDIOVASCULAR: S1, S2 normal. No murmurs, rubs, or gallops.  ABDOMEN: Soft, non-tender, non-distended. Bowel sounds present. No organomegaly or mass.  NEUROLOGIC: Moves all 4 extremities. PSYCHIATRIC: The patient is alert and oriented x 3.  SKIN: No obvious rash, lesion, or ulcer.   DATA  REVIEW:   CBC No results for input(s): WBC, HGB, HCT, PLT in the last 168 hours.  Chemistries  No results for input(s): NA, K, CL, CO2, GLUCOSE, BUN, CREATININE, CALCIUM, MG, AST, ALT, ALKPHOS, BILITOT in the last 168 hours.  Invalid input(s): GFRCGP  Cardiac Enzymes No results for input(s): TROPONINI in the last 168 hours.  Microbiology Results  Results for orders placed or performed during the hospital encounter of 07/28/18  SARS Coronavirus 2 (CEPHEID- Performed in [facility] hospital lab), Hosp Order     Status: None   Collection Time: 07/28/18  3:53 PM   Specimen: Nasopharyngeal  Result Value Ref Range Status   SARS Coronavirus 2 NEGATIVE NEGATIVE Final    Comment: (NOTE) If result is NEGATIVE SARS-CoV-2 target nucleic acids are NOT DETECTED. The SARS-CoV-2 RNA is generally detectable in upper and lower  respiratory specimens during the acute phase of infection. The lowest  concentration of SARS-CoV-2 viral copies this assay can  detect is 250  copies / mL. A negative result does not preclude SARS-CoV-2 infection  and should not be used as the sole basis for treatment or other  patient management decisions.  A negative result may occur with  improper specimen collection / handling, submission of specimen other  than nasopharyngeal swab, presence of viral mutation(s) within the  areas targeted by this assay, and inadequate number of viral copies  (<250 copies / mL). A negative result must be combined with clinical  observations, patient history, and epidemiological information. If result is POSITIVE SARS-CoV-2 target nucleic acids are DETECTED. The SARS-CoV-2 RNA is generally detectable in upper and lower  respiratory specimens dur ing the acute phase of infection.  Positive  results are indicative of active infection with SARS-CoV-2.  Clinical  correlation with patient history and other diagnostic information is  necessary to determine patient infection status.  Positive results do  not rule out bacterial infection or co-infection with other viruses. If result is PRESUMPTIVE POSTIVE SARS-CoV-2 nucleic acids MAY BE PRESENT.   A presumptive positive result was obtained on the submitted specimen  and confirmed on repeat testing.  While 2019 novel coronavirus  (SARS-CoV-2) nucleic acids may be present in the submitted sample  additional confirmatory testing may be necessary for epidemiological  and / or clinical management purposes  to differentiate between  SARS-CoV-2 and other Sarbecovirus currently known to infect humans.  If clinically indicated additional testing with an alternate test  methodology [id number]) is advised. The SARS-CoV-2 RNA is generally  detectable in upper and lower respiratory sp ecimens during the acute  phase of infection. The expected result is Negative. Fact Sheet for Patients:  StrictlyIdeas.no Fact Sheet for Healthcare  Providers: BankingDealers.co.za This test is not yet approved or cleared by the [place] FDA and has been authorized for detection and/or diagnosis of SARS-CoV-2 by FDA under an Emergency Use Authorization (EUA).  This EUA will remain in effect (meaning this test can be used) for the duration of the COVID-19 declaration under Section 564(b)(1) of the Act, 21 U.S.C. section 360bbb-3(b)(1), unless the authorization is terminated or revoked sooner. Performed at [facility], [address] [place], [place] [address]   Urine culture     Status: Abnormal   Collection Time: 07/28/18  4:03 PM   Specimen: Urine, Random  Result Value Ref Range Status   Specimen Description   Final    URINE, RANDOM Performed at [facility], [address], [place], [place] [address]  Special Requests   Final    NONE Performed at [facility], [address] [place], [place] [address]    Culture (A)  Final    <10,000 COLONIES/mL INSIGNIFICANT GROWTH Performed at [facility] [address] [place], [place] [address]    Report Status 07/29/2018 FINAL  Final  Culture, blood (routine x 2)     Status: None   Collection Time: 07/28/18  4:32 PM   Specimen: BLOOD  Result Value Ref Range Status   Specimen Description BLOOD BLOOD LEFT FOREARM  Final   Special Requests   Final    BOTTLES DRAWN AEROBIC AND ANAEROBIC Blood Culture results may not be optimal due to an excessive volume of blood received in culture bottles   Culture   Final    NO GROWTH 5 DAYS Performed at [facility], [address] [place], [place] [address]    Report Status 08/02/2018 FINAL  Final  Culture, blood (routine x 2)     Status: None   Collection Time: 07/28/18  4:32 PM   Specimen: BLOOD  Result Value Ref Range Status   Specimen Description BLOOD RIGHT ANTECUBITAL  Final   Special Requests   Final    BOTTLES DRAWN AEROBIC AND ANAEROBIC Blood Culture  results may not be optimal due to an excessive volume of blood received in culture bottles   Culture   Final    NO GROWTH 5 DAYS Performed at [facility], [address] [place], [place] [address]    Report Status 08/02/2018 FINAL  Final    RADIOLOGY:  No results found.  Follow up with PCP in 1 week.  Management plans discussed with the patient, family and they are in agreement.  CODE STATUS:  Code Status History    Date Active Date Inactive Code Status Order ID Comments User Context   07/28/2018 2209 07/30/2018 1813 Full Code [id number]  Mayer Camel, NP Inpatient   05/07/2018 1550 05/09/2018 1447 Full Code [id number]  Mayer Camel, NP ED   06/09/2015 0206 06/14/2015 1948 Full Code [id number]  Roswell Nickel, MD Inpatient   06/08/2015 2147 06/09/2015 0206 Full Code [id number]  Fritzi Mandes, MD Inpatient   03/06/2015 2117 03/07/2015 1714 Full Code [id number]  Bettey Costa, MD Inpatient   Advance Care Planning Activity    Advance Directive Documentation     Most Recent Value  Type of Advance Directive  Healthcare Power of Attorney  Pre-existing out of facility DNR order (yellow form or pink MOST form)  -  "MOST" Form in Place?  -      TOTAL TIME TAKING CARE OF THIS PATIENT ON DAY OF DISCHARGE: more than 30 minutes.   Leia Alf Sweta Halseth M.D on 08/10/2018 at 3:43 PM  Between 7am to 6pm - Pager - [phone]  After 6pm go to [email or web address] - password [email or web address] [facility]  Office  [phone]  CC: Primary care physician; [name], MD  Note: This dictation was prepared with Dragon dictation along with smaller phrase technology. Any transcriptional errors that result from this process are unintentional.

## 2018-08-10 NOTE — Telephone Encounter (Signed)
Called patient for COVID-19 pre-screening for in office visit. ° °Have you recently traveled any where out of the local area in the last 2 weeks? No ° °Have you been in close contact with a person diagnosed with COVID-19 or someone awaiting results within the last 2 weeks? No ° °Do you currently have any of the following symptoms? If so, when did they start? °Cough     Diarrhea   Joint Pain °Fever      Muscle Pain   Red eyes °Shortness of breath   Abdominal pain  Vomiting °Loss of smell    Rash    Sore Throat °Headache    Weakness   Bruising or bleeding ° ° °Okay to proceed with visit 08/10/2018  ° ° °

## 2018-08-11 ENCOUNTER — Encounter: Payer: Self-pay | Admitting: Internal Medicine

## 2018-08-11 ENCOUNTER — Ambulatory Visit (INDEPENDENT_AMBULATORY_CARE_PROVIDER_SITE_OTHER): Payer: Medicare HMO | Admitting: Internal Medicine

## 2018-08-11 ENCOUNTER — Telehealth: Payer: Self-pay

## 2018-08-11 ENCOUNTER — Other Ambulatory Visit: Payer: Self-pay

## 2018-08-11 VITALS — BP 120/62 | HR 65 | Temp 98.2°F | Ht 65.0 in | Wt 146.6 lb

## 2018-08-11 DIAGNOSIS — J411 Mucopurulent chronic bronchitis: Secondary | ICD-10-CM

## 2018-08-11 MED ORDER — TRELEGY ELLIPTA 100-62.5-25 MCG/INH IN AEPB: 1.0000 | INHALATION_SPRAY | Freq: Every day | RESPIRATORY_TRACT | 10 refills | Status: DC

## 2018-08-11 NOTE — Patient Instructions (Signed)
Finish your current Breo inhalers, then start Trelegy inhaler once daily, rinse mouth after use. Do not use them together.

## 2018-08-11 NOTE — Progress Notes (Signed)
[facility] Consultation      Assessment and Plan:  Chronic bronchitis with excess mucus production. - Patient notes that he has congestion in his lungs, and sometimes has trouble coughing it up.  This is combined with mild dyspnea on exertion.  Findings on chest x-ray are consistent with chronic bronchitis, there is mild hyperinflation suggestive of emphysema. - He has had 2 admissions this year for pneumonia. Will change Breo to Trelegy to try to reduce the risk of recurrently pneumonia.   Chronic rhinitis with chronic cough. - Patient's chronic cough improved with treatment with Astelin. - Continue Astelin, continue Flonase.  Asked to use them both regularly.  Meds ordered this encounter  Medications   Fluticasone-Umeclidin-Vilant (TRELEGY ELLIPTA) 100-62.5-25 MCG/INH AEPB    Sig: Inhale 1 applicator into the lungs daily. Rinse mouth after use.    Dispense:  1 each    Refill:  10    Return in about 6 months (around 02/11/2019). via virtual visit.      Date: 08/11/2018  MRN# [id number] Tyler Stout 1925/12/19   Tyler Stout is a 85 y.o. old male seen in consultation for chief complaint of:    Chief Complaint  Patient presents with   Follow-up    F/U chronic brochitis-Feels like his breathing is good. c/o sinus congestion.    HPI:  Tyler Stout is a 85 y.o. male  with history of progressive supranuclear palsy. He follows with cardiology he echocardiogram which revealed ejection fraction of 45-50% with mild AI MR and TR with mild to moderate pulmonary hypertension.  Since his last visit he feels that his breathing is doing better than previous. He is using Breo daily but complains that it is expensive. He is using astelin and flonase both once daily.  He was in the hospital for pneumonia in July and in April of 2020.  He denies cough.  Denies hemoptysis. **Chest x-ray 01/27/2018>> mild hyperinflation, mild changes of chronic bronchitis.  Lungs  are otherwise unremarkable. **CBC 05/05/2017>> absolute eosinophil count 200. **Echocardiogram 04/21/8117>> EF 14%, Systolic pressure was mildly increased. PA   peak pressure: 42 mm Hg (S).   Medication:    Current Outpatient Medications:    amLODipine (NORVASC) 5 MG tablet, TAKE 1 TABLET (5 MG TOTAL) BY MOUTH DAILY., Disp: 90 tablet, Rfl: 1   Ascorbic Acid (VITAMIN C PO), Take 1 tablet by mouth daily. Reported on 06/20/2015, Disp: , Rfl:    atorvastatin (LIPITOR) 20 MG tablet, TAKE 1 TABLET (20 MG TOTAL) BY MOUTH DAILY., Disp: 90 tablet, Rfl: 3   azelastine (ASTELIN) 0.1 % nasal spray, PLACE 1 SPRAY INTO BOTH NOSTRILS 2 (TWO) TIMES DAILY, Disp: 30 mL, Rfl: 1   BREO ELLIPTA 200-25 MCG/INH AEPB, TAKE 1 PUFF BY MOUTH EVERY DAY, Disp: , Rfl:    carbidopa-levodopa (SINEMET IR) 25-100 MG tablet, TAKE 2 TABLETS BY MOUTH 3 TIMES DAILY, Disp: , Rfl: 2   citalopram (CELEXA) 20 MG tablet, TAKE 1 TABLET (20 MG TOTAL) BY MOUTH DAILY., Disp: 90 tablet, Rfl: 2   fluticasone (FLONASE) 50 MCG/ACT nasal spray, SPRAY 2 SPRAYS INTO EACH NOSTRIL EVERY DAY, Disp: 48 g, Rfl: 1   furosemide (LASIX) 20 MG tablet, Take 20 mg by mouth daily. , Disp: , Rfl:    latanoprost (XALATAN) 0.005 % ophthalmic solution, Place 1 drop into both eyes at bedtime. , Disp: , Rfl:    metoprolol tartrate (LOPRESSOR) 25 MG tablet, TAKE 1 TABLET BY MOUTH TWICE A DAY,  Disp: , Rfl:    Multiple Vitamin (MULTIVITAMIN WITH MINERALS) TABS tablet, Take 1 tablet by mouth daily., Disp: , Rfl:    pantoprazole (PROTONIX) 40 MG tablet, TAKE 1 TABLET BY MOUTH DAILY, Disp: 90 tablet, Rfl: 3   tamsulosin (FLOMAX) 0.4 MG CAPS capsule, TAKE 1 CAPSULE (0.4 MG TOTAL) BY MOUTH DAILY., Disp: 90 capsule, Rfl: 1   Allergies:  Ilosone [erythromycin] and Penicillins  Review of Systems:  Constitutional: Feels well. Cardiovascular: Denies chest pain, exertional chest pain.  Pulmonary: Denies hemoptysis, pleuritic chest pain.   The remainder of  systems were reviewed and were found to be negative other than what is documented in the HPI.    Physical Examination:   VS: BP 120/62 (BP Location: Right Arm, Cuff Size: Normal)    Pulse 65    Temp 98.2 F (36.8 C) (Skin)    Ht 5\' 5"  (1.651 m)    Wt 146 lb 9.6 oz (66.5 kg)    SpO2 97%    BMI 24.40 kg/m   General Appearance: No distress  Neuro:without focal findings, mental status, speech normal, alert and oriented HEENT: PERRLA, EOM intact Pulmonary: No wheezing, No rales  CardiovascularNormal S1,S2.  No m/r/g.  Abdomen: Benign, Soft, non-tender, No masses Renal:  No costovertebral tenderness  GU:  No performed at this time. Endoc: No evident thyromegaly, no signs of acromegaly or Cushing features Skin:   warm, no rashes, no ecchymosis  Extremities: normal, no cyanosis, clubbing.  \  LABORATORY PANEL:   CBC No results for input(s): WBC, HGB, HCT, PLT in the last 168 hours. ------------------------------------------------------------------------------------------------------------------  Chemistries  No results for input(s): NA, K, CL, CO2, GLUCOSE, BUN, CREATININE, CALCIUM, MG, AST, ALT, ALKPHOS, BILITOT in the last 168 hours.  Invalid input(s): GFRCGP ------------------------------------------------------------------------------------------------------------------  Cardiac Enzymes No results for input(s): TROPONINI in the last 168 hours. ------------------------------------------------------------  RADIOLOGY:  No results found.     Thank  you for the consultation and for allowing [facility] to assist in the care of your patient. Our recommendations are noted above.  Please contact us if we can be of further service.   Marda Stalker, M.D., F.C.C.P.  Board Certified in Internal Medicine, Pulmonary Medicine, [place], and Sleep Medicine.  [facility] Office Number: [phone]   08/11/2018

## 2018-08-11 NOTE — Telephone Encounter (Signed)
Called and left message to have swallowing study sent over.

## 2018-08-12 DIAGNOSIS — D509 Iron deficiency anemia, unspecified: Secondary | ICD-10-CM | POA: Diagnosis not present

## 2018-08-12 DIAGNOSIS — K5790 Diverticulosis of intestine, part unspecified, without perforation or abscess without bleeding: Secondary | ICD-10-CM | POA: Diagnosis not present

## 2018-08-12 DIAGNOSIS — I482 Chronic atrial fibrillation, unspecified: Secondary | ICD-10-CM | POA: Diagnosis not present

## 2018-08-12 DIAGNOSIS — J189 Pneumonia, unspecified organism: Secondary | ICD-10-CM | POA: Diagnosis not present

## 2018-08-12 DIAGNOSIS — M1990 Unspecified osteoarthritis, unspecified site: Secondary | ICD-10-CM | POA: Diagnosis not present

## 2018-08-12 DIAGNOSIS — G231 Progressive supranuclear ophthalmoplegia [Steele-Richardson-Olszewski]: Secondary | ICD-10-CM | POA: Diagnosis not present

## 2018-08-12 DIAGNOSIS — I10 Essential (primary) hypertension: Secondary | ICD-10-CM | POA: Diagnosis not present

## 2018-08-12 DIAGNOSIS — I2510 Atherosclerotic heart disease of native coronary artery without angina pectoris: Secondary | ICD-10-CM | POA: Diagnosis not present

## 2018-08-12 DIAGNOSIS — N400 Enlarged prostate without lower urinary tract symptoms: Secondary | ICD-10-CM | POA: Diagnosis not present

## 2018-08-12 DIAGNOSIS — M545 Low back pain: Secondary | ICD-10-CM | POA: Diagnosis not present

## 2018-08-13 DIAGNOSIS — H04123 Dry eye syndrome of bilateral lacrimal glands: Secondary | ICD-10-CM | POA: Diagnosis not present

## 2018-08-16 DIAGNOSIS — M1990 Unspecified osteoarthritis, unspecified site: Secondary | ICD-10-CM | POA: Diagnosis not present

## 2018-08-16 DIAGNOSIS — M545 Low back pain: Secondary | ICD-10-CM | POA: Diagnosis not present

## 2018-08-16 DIAGNOSIS — D509 Iron deficiency anemia, unspecified: Secondary | ICD-10-CM | POA: Diagnosis not present

## 2018-08-16 DIAGNOSIS — I2510 Atherosclerotic heart disease of native coronary artery without angina pectoris: Secondary | ICD-10-CM | POA: Diagnosis not present

## 2018-08-16 DIAGNOSIS — I482 Chronic atrial fibrillation, unspecified: Secondary | ICD-10-CM | POA: Diagnosis not present

## 2018-08-16 DIAGNOSIS — G231 Progressive supranuclear ophthalmoplegia [Steele-Richardson-Olszewski]: Secondary | ICD-10-CM | POA: Diagnosis not present

## 2018-08-16 DIAGNOSIS — I10 Essential (primary) hypertension: Secondary | ICD-10-CM | POA: Diagnosis not present

## 2018-08-16 DIAGNOSIS — J189 Pneumonia, unspecified organism: Secondary | ICD-10-CM | POA: Diagnosis not present

## 2018-08-16 DIAGNOSIS — K5790 Diverticulosis of intestine, part unspecified, without perforation or abscess without bleeding: Secondary | ICD-10-CM | POA: Diagnosis not present

## 2018-08-16 DIAGNOSIS — N400 Enlarged prostate without lower urinary tract symptoms: Secondary | ICD-10-CM | POA: Diagnosis not present

## 2018-08-18 DIAGNOSIS — I482 Chronic atrial fibrillation, unspecified: Secondary | ICD-10-CM | POA: Diagnosis not present

## 2018-08-18 DIAGNOSIS — K5790 Diverticulosis of intestine, part unspecified, without perforation or abscess without bleeding: Secondary | ICD-10-CM | POA: Diagnosis not present

## 2018-08-18 DIAGNOSIS — M545 Low back pain: Secondary | ICD-10-CM | POA: Diagnosis not present

## 2018-08-18 DIAGNOSIS — G231 Progressive supranuclear ophthalmoplegia [Steele-Richardson-Olszewski]: Secondary | ICD-10-CM | POA: Diagnosis not present

## 2018-08-18 DIAGNOSIS — D509 Iron deficiency anemia, unspecified: Secondary | ICD-10-CM | POA: Diagnosis not present

## 2018-08-18 DIAGNOSIS — M1990 Unspecified osteoarthritis, unspecified site: Secondary | ICD-10-CM | POA: Diagnosis not present

## 2018-08-18 DIAGNOSIS — J189 Pneumonia, unspecified organism: Secondary | ICD-10-CM | POA: Diagnosis not present

## 2018-08-18 DIAGNOSIS — I2510 Atherosclerotic heart disease of native coronary artery without angina pectoris: Secondary | ICD-10-CM | POA: Diagnosis not present

## 2018-08-18 DIAGNOSIS — N400 Enlarged prostate without lower urinary tract symptoms: Secondary | ICD-10-CM | POA: Diagnosis not present

## 2018-08-18 DIAGNOSIS — I10 Essential (primary) hypertension: Secondary | ICD-10-CM | POA: Diagnosis not present

## 2018-08-20 ENCOUNTER — Telehealth: Payer: Self-pay | Admitting: Internal Medicine

## 2018-08-20 DIAGNOSIS — M545 Low back pain: Secondary | ICD-10-CM | POA: Diagnosis not present

## 2018-08-20 DIAGNOSIS — D509 Iron deficiency anemia, unspecified: Secondary | ICD-10-CM | POA: Diagnosis not present

## 2018-08-20 DIAGNOSIS — I2510 Atherosclerotic heart disease of native coronary artery without angina pectoris: Secondary | ICD-10-CM | POA: Diagnosis not present

## 2018-08-20 DIAGNOSIS — I10 Essential (primary) hypertension: Secondary | ICD-10-CM | POA: Diagnosis not present

## 2018-08-20 DIAGNOSIS — J189 Pneumonia, unspecified organism: Secondary | ICD-10-CM | POA: Diagnosis not present

## 2018-08-20 DIAGNOSIS — K5790 Diverticulosis of intestine, part unspecified, without perforation or abscess without bleeding: Secondary | ICD-10-CM | POA: Diagnosis not present

## 2018-08-20 DIAGNOSIS — M1990 Unspecified osteoarthritis, unspecified site: Secondary | ICD-10-CM | POA: Diagnosis not present

## 2018-08-20 DIAGNOSIS — N400 Enlarged prostate without lower urinary tract symptoms: Secondary | ICD-10-CM | POA: Diagnosis not present

## 2018-08-20 DIAGNOSIS — I482 Chronic atrial fibrillation, unspecified: Secondary | ICD-10-CM | POA: Diagnosis not present

## 2018-08-20 DIAGNOSIS — G231 Progressive supranuclear ophthalmoplegia [Steele-Richardson-Olszewski]: Secondary | ICD-10-CM | POA: Diagnosis not present

## 2018-08-20 NOTE — Telephone Encounter (Signed)
Was anything wrong with pt?  Just wanted to confirm he was doing ok.

## 2018-08-20 NOTE — Telephone Encounter (Signed)
Di [facility]) states she was unable to complete pt's Eval today and will get to it as soon as possible next week.  She simply wanted to make Dr Nicki Reaper aware of the delay.  Please call Di Kindle if necessary: [phone]

## 2018-08-20 NOTE — Telephone Encounter (Signed)
Just an fyi for you

## 2018-08-23 NOTE — Telephone Encounter (Signed)
Confirmed with [facility] that nothing was wrong with pt. She just was not able to make it to see him on Friday. She will be going out this week

## 2018-08-24 DIAGNOSIS — I482 Chronic atrial fibrillation, unspecified: Secondary | ICD-10-CM | POA: Diagnosis not present

## 2018-08-24 DIAGNOSIS — J189 Pneumonia, unspecified organism: Secondary | ICD-10-CM | POA: Diagnosis not present

## 2018-08-24 DIAGNOSIS — I10 Essential (primary) hypertension: Secondary | ICD-10-CM | POA: Diagnosis not present

## 2018-08-24 DIAGNOSIS — M1990 Unspecified osteoarthritis, unspecified site: Secondary | ICD-10-CM | POA: Diagnosis not present

## 2018-08-24 DIAGNOSIS — K5790 Diverticulosis of intestine, part unspecified, without perforation or abscess without bleeding: Secondary | ICD-10-CM | POA: Diagnosis not present

## 2018-08-24 DIAGNOSIS — G231 Progressive supranuclear ophthalmoplegia [Steele-Richardson-Olszewski]: Secondary | ICD-10-CM | POA: Diagnosis not present

## 2018-08-24 DIAGNOSIS — N400 Enlarged prostate without lower urinary tract symptoms: Secondary | ICD-10-CM | POA: Diagnosis not present

## 2018-08-24 DIAGNOSIS — I2510 Atherosclerotic heart disease of native coronary artery without angina pectoris: Secondary | ICD-10-CM | POA: Diagnosis not present

## 2018-08-24 DIAGNOSIS — M545 Low back pain: Secondary | ICD-10-CM | POA: Diagnosis not present

## 2018-08-24 DIAGNOSIS — D509 Iron deficiency anemia, unspecified: Secondary | ICD-10-CM | POA: Diagnosis not present

## 2018-08-27 ENCOUNTER — Ambulatory Visit: Payer: Medicare HMO | Admitting: Internal Medicine

## 2018-08-27 DIAGNOSIS — D509 Iron deficiency anemia, unspecified: Secondary | ICD-10-CM | POA: Diagnosis not present

## 2018-08-27 DIAGNOSIS — M1990 Unspecified osteoarthritis, unspecified site: Secondary | ICD-10-CM | POA: Diagnosis not present

## 2018-08-27 DIAGNOSIS — K5790 Diverticulosis of intestine, part unspecified, without perforation or abscess without bleeding: Secondary | ICD-10-CM | POA: Diagnosis not present

## 2018-08-27 DIAGNOSIS — I482 Chronic atrial fibrillation, unspecified: Secondary | ICD-10-CM | POA: Diagnosis not present

## 2018-08-27 DIAGNOSIS — I10 Essential (primary) hypertension: Secondary | ICD-10-CM | POA: Diagnosis not present

## 2018-08-27 DIAGNOSIS — J189 Pneumonia, unspecified organism: Secondary | ICD-10-CM | POA: Diagnosis not present

## 2018-08-27 DIAGNOSIS — G231 Progressive supranuclear ophthalmoplegia [Steele-Richardson-Olszewski]: Secondary | ICD-10-CM | POA: Diagnosis not present

## 2018-08-27 DIAGNOSIS — I2510 Atherosclerotic heart disease of native coronary artery without angina pectoris: Secondary | ICD-10-CM | POA: Diagnosis not present

## 2018-08-27 DIAGNOSIS — N400 Enlarged prostate without lower urinary tract symptoms: Secondary | ICD-10-CM | POA: Diagnosis not present

## 2018-08-27 DIAGNOSIS — M545 Low back pain: Secondary | ICD-10-CM | POA: Diagnosis not present

## 2018-08-31 DIAGNOSIS — I10 Essential (primary) hypertension: Secondary | ICD-10-CM | POA: Diagnosis not present

## 2018-08-31 DIAGNOSIS — K5790 Diverticulosis of intestine, part unspecified, without perforation or abscess without bleeding: Secondary | ICD-10-CM | POA: Diagnosis not present

## 2018-08-31 DIAGNOSIS — I2510 Atherosclerotic heart disease of native coronary artery without angina pectoris: Secondary | ICD-10-CM | POA: Diagnosis not present

## 2018-08-31 DIAGNOSIS — G231 Progressive supranuclear ophthalmoplegia [Steele-Richardson-Olszewski]: Secondary | ICD-10-CM | POA: Diagnosis not present

## 2018-08-31 DIAGNOSIS — D509 Iron deficiency anemia, unspecified: Secondary | ICD-10-CM | POA: Diagnosis not present

## 2018-08-31 DIAGNOSIS — I482 Chronic atrial fibrillation, unspecified: Secondary | ICD-10-CM | POA: Diagnosis not present

## 2018-08-31 DIAGNOSIS — M1990 Unspecified osteoarthritis, unspecified site: Secondary | ICD-10-CM | POA: Diagnosis not present

## 2018-08-31 DIAGNOSIS — J189 Pneumonia, unspecified organism: Secondary | ICD-10-CM | POA: Diagnosis not present

## 2018-08-31 DIAGNOSIS — N400 Enlarged prostate without lower urinary tract symptoms: Secondary | ICD-10-CM | POA: Diagnosis not present

## 2018-08-31 DIAGNOSIS — M545 Low back pain: Secondary | ICD-10-CM | POA: Diagnosis not present

## 2018-09-03 DIAGNOSIS — M1990 Unspecified osteoarthritis, unspecified site: Secondary | ICD-10-CM | POA: Diagnosis not present

## 2018-09-03 DIAGNOSIS — M545 Low back pain: Secondary | ICD-10-CM | POA: Diagnosis not present

## 2018-09-03 DIAGNOSIS — K5790 Diverticulosis of intestine, part unspecified, without perforation or abscess without bleeding: Secondary | ICD-10-CM | POA: Diagnosis not present

## 2018-09-03 DIAGNOSIS — I482 Chronic atrial fibrillation, unspecified: Secondary | ICD-10-CM | POA: Diagnosis not present

## 2018-09-03 DIAGNOSIS — J189 Pneumonia, unspecified organism: Secondary | ICD-10-CM | POA: Diagnosis not present

## 2018-09-03 DIAGNOSIS — N400 Enlarged prostate without lower urinary tract symptoms: Secondary | ICD-10-CM | POA: Diagnosis not present

## 2018-09-03 DIAGNOSIS — I10 Essential (primary) hypertension: Secondary | ICD-10-CM | POA: Diagnosis not present

## 2018-09-03 DIAGNOSIS — I2510 Atherosclerotic heart disease of native coronary artery without angina pectoris: Secondary | ICD-10-CM | POA: Diagnosis not present

## 2018-09-03 DIAGNOSIS — D509 Iron deficiency anemia, unspecified: Secondary | ICD-10-CM | POA: Diagnosis not present

## 2018-09-03 DIAGNOSIS — G231 Progressive supranuclear ophthalmoplegia [Steele-Richardson-Olszewski]: Secondary | ICD-10-CM | POA: Diagnosis not present

## 2018-09-08 DIAGNOSIS — H401132 Primary open-angle glaucoma, bilateral, moderate stage: Secondary | ICD-10-CM | POA: Diagnosis not present

## 2018-09-13 DIAGNOSIS — M1990 Unspecified osteoarthritis, unspecified site: Secondary | ICD-10-CM | POA: Diagnosis not present

## 2018-09-13 DIAGNOSIS — I2510 Atherosclerotic heart disease of native coronary artery without angina pectoris: Secondary | ICD-10-CM | POA: Diagnosis not present

## 2018-09-13 DIAGNOSIS — M545 Low back pain: Secondary | ICD-10-CM | POA: Diagnosis not present

## 2018-09-13 DIAGNOSIS — D509 Iron deficiency anemia, unspecified: Secondary | ICD-10-CM | POA: Diagnosis not present

## 2018-09-13 DIAGNOSIS — N400 Enlarged prostate without lower urinary tract symptoms: Secondary | ICD-10-CM | POA: Diagnosis not present

## 2018-09-13 DIAGNOSIS — G231 Progressive supranuclear ophthalmoplegia [Steele-Richardson-Olszewski]: Secondary | ICD-10-CM | POA: Diagnosis not present

## 2018-09-13 DIAGNOSIS — I482 Chronic atrial fibrillation, unspecified: Secondary | ICD-10-CM | POA: Diagnosis not present

## 2018-09-13 DIAGNOSIS — J189 Pneumonia, unspecified organism: Secondary | ICD-10-CM | POA: Diagnosis not present

## 2018-09-13 DIAGNOSIS — K5790 Diverticulosis of intestine, part unspecified, without perforation or abscess without bleeding: Secondary | ICD-10-CM | POA: Diagnosis not present

## 2018-09-13 DIAGNOSIS — I10 Essential (primary) hypertension: Secondary | ICD-10-CM | POA: Diagnosis not present

## 2018-09-16 ENCOUNTER — Ambulatory Visit: Payer: Medicare HMO | Admitting: Internal Medicine

## 2018-09-16 DIAGNOSIS — R69 Illness, unspecified: Secondary | ICD-10-CM | POA: Diagnosis not present

## 2018-09-16 DIAGNOSIS — Z23 Encounter for immunization: Secondary | ICD-10-CM | POA: Diagnosis not present

## 2018-09-24 DIAGNOSIS — Z85828 Personal history of other malignant neoplasm of skin: Secondary | ICD-10-CM | POA: Diagnosis not present

## 2018-09-24 DIAGNOSIS — Z08 Encounter for follow-up examination after completed treatment for malignant neoplasm: Secondary | ICD-10-CM | POA: Diagnosis not present

## 2018-09-24 DIAGNOSIS — L821 Other seborrheic keratosis: Secondary | ICD-10-CM | POA: Diagnosis not present

## 2018-09-24 DIAGNOSIS — X32XXXA Exposure to sunlight, initial encounter: Secondary | ICD-10-CM | POA: Diagnosis not present

## 2018-09-24 DIAGNOSIS — D171 Benign lipomatous neoplasm of skin and subcutaneous tissue of trunk: Secondary | ICD-10-CM | POA: Diagnosis not present

## 2018-09-24 DIAGNOSIS — L570 Actinic keratosis: Secondary | ICD-10-CM | POA: Diagnosis not present

## 2018-09-28 DIAGNOSIS — K31819 Angiodysplasia of stomach and duodenum without bleeding: Secondary | ICD-10-CM | POA: Diagnosis not present

## 2018-09-28 DIAGNOSIS — I480 Paroxysmal atrial fibrillation: Secondary | ICD-10-CM | POA: Diagnosis not present

## 2018-09-28 DIAGNOSIS — I2510 Atherosclerotic heart disease of native coronary artery without angina pectoris: Secondary | ICD-10-CM | POA: Diagnosis not present

## 2018-09-28 DIAGNOSIS — I10 Essential (primary) hypertension: Secondary | ICD-10-CM | POA: Diagnosis not present

## 2018-09-28 DIAGNOSIS — I951 Orthostatic hypotension: Secondary | ICD-10-CM | POA: Diagnosis not present

## 2018-09-28 DIAGNOSIS — I700 Atherosclerosis of aorta: Secondary | ICD-10-CM | POA: Diagnosis not present

## 2018-10-05 ENCOUNTER — Other Ambulatory Visit: Payer: Self-pay

## 2018-10-05 ENCOUNTER — Encounter: Payer: Self-pay | Admitting: Internal Medicine

## 2018-10-05 ENCOUNTER — Ambulatory Visit (INDEPENDENT_AMBULATORY_CARE_PROVIDER_SITE_OTHER): Payer: Medicare HMO | Admitting: Internal Medicine

## 2018-10-05 VITALS — BP 138/62 | HR 69 | Temp 97.6°F | Wt 150.8 lb

## 2018-10-05 DIAGNOSIS — R739 Hyperglycemia, unspecified: Secondary | ICD-10-CM | POA: Diagnosis not present

## 2018-10-05 DIAGNOSIS — I4891 Unspecified atrial fibrillation: Secondary | ICD-10-CM

## 2018-10-05 DIAGNOSIS — G231 Progressive supranuclear ophthalmoplegia [Steele-Richardson-Olszewski]: Secondary | ICD-10-CM

## 2018-10-05 DIAGNOSIS — L989 Disorder of the skin and subcutaneous tissue, unspecified: Secondary | ICD-10-CM

## 2018-10-05 DIAGNOSIS — D508 Other iron deficiency anemias: Secondary | ICD-10-CM | POA: Diagnosis not present

## 2018-10-05 DIAGNOSIS — I2510 Atherosclerotic heart disease of native coronary artery without angina pectoris: Secondary | ICD-10-CM

## 2018-10-05 DIAGNOSIS — I10 Essential (primary) hypertension: Secondary | ICD-10-CM | POA: Diagnosis not present

## 2018-10-05 DIAGNOSIS — I700 Atherosclerosis of aorta: Secondary | ICD-10-CM | POA: Diagnosis not present

## 2018-10-05 DIAGNOSIS — E7800 Pure hypercholesterolemia, unspecified: Secondary | ICD-10-CM | POA: Diagnosis not present

## 2018-10-05 DIAGNOSIS — J411 Mucopurulent chronic bronchitis: Secondary | ICD-10-CM | POA: Diagnosis not present

## 2018-10-05 LAB — CBC WITH DIFFERENTIAL/PLATELET
Basophils Absolute: 0 10*3/uL (ref 0.0–0.1)
Basophils Relative: 0.6 % (ref 0.0–3.0)
Eosinophils Absolute: 0 10*3/uL (ref 0.0–0.7)
Eosinophils Relative: 0.6 % (ref 0.0–5.0)
HCT: 38.3 % — ABNORMAL LOW (ref 39.0–52.0)
Hemoglobin: 12.9 g/dL — ABNORMAL LOW (ref 13.0–17.0)
Lymphocytes Relative: 13.2 % (ref 12.0–46.0)
Lymphs Abs: 1 10*3/uL (ref 0.7–4.0)
MCHC: 33.6 g/dL (ref 30.0–36.0)
MCV: 97.7 fl (ref 78.0–100.0)
Monocytes Absolute: 0.9 10*3/uL (ref 0.1–1.0)
Monocytes Relative: 12.5 % — ABNORMAL HIGH (ref 3.0–12.0)
Neutro Abs: 5.3 10*3/uL (ref 1.4–7.7)
Neutrophils Relative %: 73.1 % (ref 43.0–77.0)
Platelets: 225 10*3/uL (ref 150.0–400.0)
RBC: 3.92 Mil/uL — ABNORMAL LOW (ref 4.22–5.81)
RDW: 15.9 % — ABNORMAL HIGH (ref 11.5–15.5)
WBC: 7.2 10*3/uL (ref 4.0–10.5)

## 2018-10-05 LAB — LIPID PANEL
Cholesterol: 140 mg/dL (ref 0–200)
HDL: 53.1 mg/dL (ref >39.00)
LDL Cholesterol: 59 mg/dL (ref 0–99)
NonHDL: 86.83
Total CHOL/HDL Ratio: 3
Triglycerides: 137 mg/dL (ref 0.0–149.0)
VLDL: 27.4 mg/dL (ref 0.0–40.0)

## 2018-10-05 LAB — HEPATIC FUNCTION PANEL
ALT: 8 U/L (ref 0–53)
AST: 23 U/L (ref 0–37)
Albumin: 4.3 g/dL (ref 3.5–5.2)
Alkaline Phosphatase: 81 U/L (ref 39–117)
Bilirubin, Direct: 0.1 mg/dL (ref 0.0–0.3)
Total Bilirubin: 0.6 mg/dL (ref 0.2–1.2)
Total Protein: 7 g/dL (ref 6.0–8.3)

## 2018-10-05 LAB — BASIC METABOLIC PANEL
BUN: 18 mg/dL (ref 6–23)
CO2: 29 mEq/L (ref 19–32)
Calcium: 9.4 mg/dL (ref 8.4–10.5)
Chloride: 100 mEq/L (ref 96–112)
Creatinine, Ser: 0.74 mg/dL (ref 0.40–1.50)
GFR: 99.09 mL/min (ref >60.00)
Glucose, Bld: 124 mg/dL — ABNORMAL HIGH (ref 70–99)
Potassium: 4 mEq/L (ref 3.5–5.1)
Sodium: 135 mEq/L (ref 135–145)

## 2018-10-05 LAB — HEMOGLOBIN A1C: Hgb A1c MFr Bld: 5.7 % (ref 4.6–6.5)

## 2018-10-05 MED ORDER — AZELASTINE HCL 0.1 % NA SOLN: NASAL | 1 refills | Status: DC

## 2018-10-05 NOTE — Progress Notes (Signed)
[name]: Tyler Stout, male   DOB: [birth year], 85 y.o.   MRN: [id number]   Subjective:    [name]: Tyler Stout, male    DOB: 20-Oct-1925, 85 y.o.   MRN: [id number]  HPI  Patient here for a scheduled follow up.  He is accompanied by his wife.  History obtained from both of them.  Reports - stable.  Breathing overall stable.  Sees pulmonary.  Recently changed to trelegy.  States did not tolerate.  Using breo now.  Discussed persistent feeling of congestion in his throat.  Discussed astelin nasal spray.  No chest tightness or chest pain.  No abdominal pain.  Bowels moving.  Getting around his house. Wife reports things are stable.  Does have persistent lesion on his head.  Request dermatology referral.  Blood pressures doing well.  Outside readings reviewed.    Past Medical History:  Diagnosis Date   Anemia    iron deficiency   Arthritis    Atrial fibrillation ([place])    Bilateral renal cysts    worked up by Dr Bernardo Heater   BPH (benign prostatic hypertrophy)    CAD (coronary artery disease)    s/p bypass graft x 4 (1991)   Diverticulosis    GERD (gastroesophageal reflux disease)    Glaucoma    Hypercholesterolemia    Hypertension    Pancreatitis    x2 (1994 and 1998), presumed to be gallstone pancreatitis.  s/p ERCP and sphincterotomy   Past Surgical History:  Procedure Laterality Date   APPENDECTOMY     CHOLECYSTECTOMY  1994   CORONARY ARTERY BYPASS GRAFT     x 4,  1991   INGUINAL HERNIA REPAIR  2009   open heart surgery  1991   Family History  Problem Relation Age of Onset   Throat cancer Father        oropharyngeal cancer   Kidney disease Sister    Heart disease Other    Colon cancer Neg Hx    Prostate cancer Neg Hx    Social History   Socioeconomic History   Marital status: Married    Spouse name: Not on file   Number of children: Not on file   Years of education: Not on file   Highest education level: Not on file  Occupational  History   Not on file  Social Needs   Financial resource strain: Not on file   Food insecurity    Worry: Not on file    Inability: Not on file   Transportation needs    Medical: Not on file    Non-medical: Not on file  Tobacco Use   Smoking status: Former Smoker    Packs/day: 2.00    Years: 25.00    Pack years: 50.00    Types: Cigarettes    Quit date: 01/14/1964    Years since quitting: 54.7   Smokeless tobacco: Never Used   Tobacco comment: smoked 2 packs/day, 1 pipe and 1 cigar once in a while.  Substance and Sexual Activity   Alcohol use: No    Alcohol/week: 0.0 standard drinks   Drug use: No   Sexual activity: Not on file  Lifestyle   Physical activity    Days per week: Not on file    Minutes per session: Not on file   Stress: Not on file  Relationships   Social connections    Talks on phone: Not on file    Gets together: Not on file  Attends religious service: Not on file    Active member of club or organization: Not on file    Attends meetings of clubs or organizations: Not on file    Relationship status: Not on file  Other Topics Concern   Not on file  Social History Narrative   Not on file    Outpatient Encounter Medications as of 10/05/2018  Medication Sig   amLODipine (NORVASC) 5 MG tablet TAKE 1 TABLET (5 MG TOTAL) BY MOUTH DAILY.   Ascorbic Acid (VITAMIN C PO) Take 1 tablet by mouth daily. Reported on 06/20/2015   atorvastatin (LIPITOR) 20 MG tablet TAKE 1 TABLET (20 MG TOTAL) BY MOUTH DAILY.   azelastine (ASTELIN) 0.1 % nasal spray PLACE 1 SPRAY INTO BOTH NOSTRILS 2 (TWO) TIMES DAILY   BREO ELLIPTA 200-25 MCG/INH AEPB TAKE 1 PUFF BY MOUTH EVERY DAY   carbidopa-levodopa (SINEMET IR) 25-100 MG tablet TAKE 2 TABLETS BY MOUTH 3 TIMES DAILY   citalopram (CELEXA) 20 MG tablet TAKE 1 TABLET (20 MG TOTAL) BY MOUTH DAILY.   fluticasone (FLONASE) 50 MCG/ACT nasal spray SPRAY 2 SPRAYS INTO EACH NOSTRIL EVERY DAY    Fluticasone-Umeclidin-Vilant (TRELEGY ELLIPTA) 100-62.5-25 MCG/INH AEPB Inhale 1 applicator into the lungs daily. Rinse mouth after use.   furosemide (LASIX) 20 MG tablet Take 20 mg by mouth daily.    latanoprost (XALATAN) 0.005 % ophthalmic solution Place 1 drop into both eyes at bedtime.    metoprolol tartrate (LOPRESSOR) 25 MG tablet TAKE 1 TABLET BY MOUTH TWICE A DAY   Multiple Vitamin (MULTIVITAMIN WITH MINERALS) TABS tablet Take 1 tablet by mouth daily.   pantoprazole (PROTONIX) 40 MG tablet TAKE 1 TABLET BY MOUTH DAILY   tamsulosin (FLOMAX) 0.4 MG CAPS capsule TAKE 1 CAPSULE (0.4 MG TOTAL) BY MOUTH DAILY.   [DISCONTINUED] azelastine (ASTELIN) 0.1 % nasal spray PLACE 1 SPRAY INTO BOTH NOSTRILS 2 (TWO) TIMES DAILY   No facility-administered encounter medications on file as of 10/05/2018.     Review of Systems  Constitutional: Negative for appetite change and unexpected weight change.  HENT: Positive for congestion. Negative for sinus pressure.   Respiratory: Negative for chest tightness.        Chronic congestion with chronic cough.  Breathing stable.   Cardiovascular: Negative for chest pain, palpitations and leg swelling.  Gastrointestinal: Negative for abdominal pain, diarrhea, nausea and vomiting.  Genitourinary: Negative for difficulty urinating and dysuria.  Musculoskeletal: Negative for joint swelling and myalgias.  Skin: Negative for color change and rash.  Neurological: Negative for dizziness, light-headedness and headaches.  Psychiatric/Behavioral: Negative for agitation and dysphoric mood.       Objective:    Physical Exam Constitutional:      General: He is not in acute distress.    Appearance: Normal appearance. He is well-developed.  HENT:     Right Ear: External ear normal.     Left Ear: External ear normal.  Eyes:     General: No scleral icterus.       Right eye: No discharge.        Left eye: No discharge.     Conjunctiva/sclera: Conjunctivae  normal.  Neck:     Musculoskeletal: Neck supple. No muscular tenderness.  Cardiovascular:     Rate and Rhythm: Normal rate and regular rhythm.  Pulmonary:     Effort: Pulmonary effort is normal. No respiratory distress.     Breath sounds: Normal breath sounds.  Abdominal:     General: Bowel sounds are normal.  Palpations: Abdomen is soft.     Tenderness: There is no abdominal tenderness.  Musculoskeletal:        General: No swelling or tenderness.  Lymphadenopathy:     Cervical: No cervical adenopathy.  Skin:    Comments: Raised hard lesion - scalp  Neurological:     Mental Status: He is alert.  Psychiatric:        Mood and Affect: Mood normal.        Behavior: Behavior normal.     BP 138/62    Pulse 69    Temp 97.6 F (36.4 C)    Wt 150 lb 12.8 oz (68.4 kg)    SpO2 97%    BMI 25.09 kg/m  Wt Readings from Last 3 Encounters:  10/05/18 150 lb 12.8 oz (68.4 kg)  08/11/18 146 lb 9.6 oz (66.5 kg)  08/03/18 145 lb (65.8 kg)     Lab Results  Component Value Date   WBC 7.2 10/05/2018   HGB 12.9 (L) 10/05/2018   HCT 38.3 (L) 10/05/2018   PLT 225.0 10/05/2018   GLUCOSE 124 (H) 10/05/2018   CHOL 140 10/05/2018   TRIG 137.0 10/05/2018   HDL 53.10 10/05/2018   LDLCALC 59 10/05/2018   ALT 8 10/05/2018   AST 23 10/05/2018   NA 135 10/05/2018   K 4.0 10/05/2018   CL 100 10/05/2018   CREATININE 0.74 10/05/2018   BUN 18 10/05/2018   CO2 29 10/05/2018   TSH 2.95 05/26/2018   INR 1.20 06/14/2015   HGBA1C 5.7 10/05/2018       Assessment & Plan:   Problem List Items Addressed This Visit    Atrial fibrillation ([place]) - Primary   Essential hypertension   Relevant Orders   CBC with Differential/Platelet (Completed)   Basic metabolic panel (Completed)   Hypercholesteremia   Relevant Orders   Hepatic function panel (Completed)   Lipid panel (Completed)   Hyperglycemia   Relevant Orders   Hemoglobin A1c (Completed)       [name], MD

## 2018-10-06 ENCOUNTER — Encounter: Payer: Self-pay | Admitting: *Deleted

## 2018-10-10 ENCOUNTER — Encounter: Payer: Self-pay | Admitting: Internal Medicine

## 2018-10-10 ENCOUNTER — Other Ambulatory Visit: Payer: Self-pay | Admitting: Internal Medicine

## 2018-10-10 DIAGNOSIS — L989 Disorder of the skin and subcutaneous tissue, unspecified: Secondary | ICD-10-CM | POA: Insufficient documentation

## 2018-10-10 DIAGNOSIS — J42 Unspecified chronic bronchitis: Secondary | ICD-10-CM | POA: Insufficient documentation

## 2018-10-10 NOTE — Assessment & Plan Note (Signed)
Persistent hard raised lesion - scalp/head.  Refer to dermatology.

## 2018-10-10 NOTE — Assessment & Plan Note (Signed)
Blood pressures have been under reasonable control.  Continue current medication.  Follow pressures.  Follow metabolic panel.

## 2018-10-10 NOTE — Assessment & Plan Note (Signed)
Sees pulmonary.  Diagnosed with chronic bronchitis with excessive mucus production.  Breathing overall stable.  Continued mucus in his throat.  astelin nasal spray.  Follow.

## 2018-10-10 NOTE — Assessment & Plan Note (Signed)
On lipitor.  Follow lipid panel and liver function tests.   

## 2018-10-10 NOTE — Assessment & Plan Note (Signed)
Followed by neurology.  Stable  

## 2018-10-10 NOTE — Assessment & Plan Note (Signed)
Followed by cardiology.  Off coumadin secondary to GI bleed.  Stable.   

## 2018-10-10 NOTE — Assessment & Plan Note (Signed)
Continue lipitor  ?

## 2018-10-10 NOTE — Assessment & Plan Note (Signed)
Has had GI w/up.  Follow cbc.  

## 2018-10-10 NOTE — Assessment & Plan Note (Signed)
Stable.  Followed by cardiology.   

## 2018-10-10 NOTE — Assessment & Plan Note (Signed)
Follow met b and a1c.

## 2018-10-15 DIAGNOSIS — D485 Neoplasm of uncertain behavior of skin: Secondary | ICD-10-CM | POA: Diagnosis not present

## 2018-10-15 DIAGNOSIS — D044 Carcinoma in situ of skin of scalp and neck: Secondary | ICD-10-CM | POA: Diagnosis not present

## 2018-10-15 DIAGNOSIS — X32XXXA Exposure to sunlight, initial encounter: Secondary | ICD-10-CM | POA: Diagnosis not present

## 2018-10-15 DIAGNOSIS — L570 Actinic keratosis: Secondary | ICD-10-CM | POA: Diagnosis not present

## 2018-10-20 DIAGNOSIS — M7541 Impingement syndrome of right shoulder: Secondary | ICD-10-CM | POA: Diagnosis not present

## 2018-10-22 DIAGNOSIS — D044 Carcinoma in situ of skin of scalp and neck: Secondary | ICD-10-CM | POA: Diagnosis not present

## 2018-10-28 ENCOUNTER — Other Ambulatory Visit: Payer: Self-pay | Admitting: Internal Medicine

## 2018-11-12 DIAGNOSIS — R296 Repeated falls: Secondary | ICD-10-CM | POA: Diagnosis not present

## 2018-11-12 DIAGNOSIS — G231 Progressive supranuclear ophthalmoplegia [Steele-Richardson-Olszewski]: Secondary | ICD-10-CM | POA: Diagnosis not present

## 2018-11-12 DIAGNOSIS — Z9189 Other specified personal risk factors, not elsewhere classified: Secondary | ICD-10-CM | POA: Diagnosis not present

## 2018-11-20 ENCOUNTER — Other Ambulatory Visit: Payer: Self-pay | Admitting: Internal Medicine

## 2018-11-23 ENCOUNTER — Other Ambulatory Visit: Payer: Self-pay | Admitting: Internal Medicine

## 2018-12-01 ENCOUNTER — Other Ambulatory Visit: Payer: Self-pay | Admitting: Internal Medicine

## 2018-12-15 ENCOUNTER — Other Ambulatory Visit: Payer: Self-pay | Admitting: Internal Medicine

## 2018-12-16 ENCOUNTER — Other Ambulatory Visit: Payer: Self-pay | Admitting: Internal Medicine

## 2018-12-28 ENCOUNTER — Other Ambulatory Visit: Payer: Self-pay | Admitting: Internal Medicine

## 2018-12-28 MED ORDER — BREO ELLIPTA 200-25 MCG/INH IN AEPB: 1.0000 | INHALATION_SPRAY | Freq: Every day | RESPIRATORY_TRACT | 2 refills | Status: DC

## 2018-12-29 ENCOUNTER — Ambulatory Visit (INDEPENDENT_AMBULATORY_CARE_PROVIDER_SITE_OTHER): Payer: Medicare HMO | Admitting: Internal Medicine

## 2018-12-29 ENCOUNTER — Telehealth: Payer: Self-pay | Admitting: Internal Medicine

## 2018-12-29 ENCOUNTER — Other Ambulatory Visit: Payer: Self-pay

## 2018-12-29 DIAGNOSIS — I2510 Atherosclerotic heart disease of native coronary artery without angina pectoris: Secondary | ICD-10-CM

## 2018-12-29 DIAGNOSIS — E7800 Pure hypercholesterolemia, unspecified: Secondary | ICD-10-CM | POA: Diagnosis not present

## 2018-12-29 DIAGNOSIS — I4891 Unspecified atrial fibrillation: Secondary | ICD-10-CM

## 2018-12-29 DIAGNOSIS — R600 Localized edema: Secondary | ICD-10-CM

## 2018-12-29 DIAGNOSIS — D508 Other iron deficiency anemias: Secondary | ICD-10-CM

## 2018-12-29 DIAGNOSIS — R739 Hyperglycemia, unspecified: Secondary | ICD-10-CM | POA: Diagnosis not present

## 2018-12-29 DIAGNOSIS — I700 Atherosclerosis of aorta: Secondary | ICD-10-CM

## 2018-12-29 DIAGNOSIS — G231 Progressive supranuclear ophthalmoplegia [Steele-Richardson-Olszewski]: Secondary | ICD-10-CM

## 2018-12-29 DIAGNOSIS — I10 Essential (primary) hypertension: Secondary | ICD-10-CM

## 2018-12-29 DIAGNOSIS — J411 Mucopurulent chronic bronchitis: Secondary | ICD-10-CM

## 2018-12-29 NOTE — Progress Notes (Signed)
[name]: Tyler Stout, male   DOB: Sep 01, 1925, 85 y.o.   MRN: [id number]   Virtual Visit via telephone Note  This visit type was conducted due to national recommendations for restrictions regarding the COVID-19 pandemic (e.g. social distancing).  This format is felt to be most appropriate for this patient at this time.  All issues noted in this document were discussed and addressed.  No physical exam was performed (except for noted visual exam findings with Video Visits).   I connected with [name] by telephone and verified that I am speaking with the correct person using two identifiers. Location patient: home Location provider: work  Persons participating in the virtual visit: patient, provider and pts wife [name])  I discussed the limitations, risks, security and privacy concerns of performing an evaluation and management service by telephone and the availability of in person appointments. The patient expressed understanding and agreed to proceed.   Reason for visit: scheduled follow up  HPI: Reports he is doing relatively well.  Has been having problems with his nose being dry and stopped up.  States has been using astelin.  Discussed stopping to confirm -not drying too much.  Discussed using saline on a regular basis and taking mucinex/robitussin.  No chest pain.  Breathing overall stable.  Denies increased sob.  No chest congestion.  No acid reflux reported.  Has known cardiomyopathy.  Takes lasix prn.  Has noticed some increased swelling in his legs - feet and ankles mostly.  Right is greater than left.  No redness.  No change in breathing.  Wife feels breathing is stable.  Is seeing dermatology for f/u head lesions.     ROS: See pertinent positives and negatives per HPI.  Past Medical History:  Diagnosis Date  . Anemia    iron deficiency  . Arthritis   . Atrial fibrillation ([place])   . Bilateral renal cysts    worked up by Dr Bernardo Heater  . BPH (benign prostatic  hypertrophy)   . CAD (coronary artery disease)    s/p bypass graft x 4 (1991)  . Diverticulosis   . GERD (gastroesophageal reflux disease)   . Glaucoma   . Hypercholesterolemia   . Hypertension   . Pancreatitis    x2 (1994 and 1998), presumed to be gallstone pancreatitis.  s/p ERCP and sphincterotomy    Past Surgical History:  Procedure Laterality Date  . APPENDECTOMY    . CHOLECYSTECTOMY  1994  . CORONARY ARTERY BYPASS GRAFT     x 4,  1991  . INGUINAL HERNIA REPAIR  2009  . open heart surgery  1991    Family History  Problem Relation Age of Onset  . Throat cancer Father        oropharyngeal cancer  . Kidney disease Sister   . Heart disease Other   . Colon cancer Neg Hx   . Prostate cancer Neg Hx     SOCIAL HX: reviewed.    Current Outpatient Medications:  .  amLODipine (NORVASC) 5 MG tablet, TAKE 1 TABLET BY MOUTH EVERY DAY, Disp: 90 tablet, Rfl: 1 .  Ascorbic Acid (VITAMIN C PO), Take 1 tablet by mouth daily. Reported on 06/20/2015, Disp: , Rfl:  .  atorvastatin (LIPITOR) 20 MG tablet, TAKE 1 TABLET (20 MG TOTAL) BY MOUTH DAILY., Disp: 90 tablet, Rfl: 3 .  azelastine (ASTELIN) 0.1 % nasal spray, PLACE 1 SPRAY INTO BOTH NOSTRILS 2 (TWO) TIMES DAILY, Disp: 30 mL, Rfl: 1 .  BREO ELLIPTA 200-25  MCG/INH AEPB, Inhale 1 puff into the lungs daily., Disp: 60 each, Rfl: 2 .  carbidopa-levodopa (SINEMET IR) 25-100 MG tablet, TAKE 2 TABLETS BY MOUTH 3 TIMES DAILY, Disp: , Rfl: 2 .  citalopram (CELEXA) 20 MG tablet, TAKE 1 TABLET (20 MG TOTAL) BY MOUTH DAILY., Disp: 90 tablet, Rfl: 2 .  fluticasone (FLONASE) 50 MCG/ACT nasal spray, SPRAY 2 SPRAYS INTO EACH NOSTRIL EVERY DAY, Disp: 48 mL, Rfl: 1 .  Fluticasone-Umeclidin-Vilant (TRELEGY ELLIPTA) 100-62.5-25 MCG/INH AEPB, Inhale 1 applicator into the lungs daily. Rinse mouth after use., Disp: 1 each, Rfl: 10 .  furosemide (LASIX) 20 MG tablet, Take 20 mg by mouth daily. , Disp: , Rfl:  .  latanoprost (XALATAN) 0.005 % ophthalmic  solution, Place 1 drop into both eyes at bedtime. , Disp: , Rfl:  .  metoprolol tartrate (LOPRESSOR) 25 MG tablet, TAKE 1 TABLET BY MOUTH TWICE A DAY, Disp: , Rfl:  .  Multiple Vitamin (MULTIVITAMIN WITH MINERALS) TABS tablet, Take 1 tablet by mouth daily., Disp: , Rfl:  .  pantoprazole (PROTONIX) 40 MG tablet, TAKE 1 TABLET BY MOUTH DAILY, Disp: 90 tablet, Rfl: 3 .  tamsulosin (FLOMAX) 0.4 MG CAPS capsule, TAKE 1 CAPSULE BY MOUTH EVERY DAY, Disp: 90 capsule, Rfl: 1  EXAM:  GENERAL: alert. Sounds to be in no acute distress.  Answering questions appropriately.    PSYCH/NEURO: pleasant and cooperative, no obvious depression or anxiety, speech and thought processing grossly intact  ASSESSMENT AND PLAN:  Discussed the following assessment and plan:  Anemia Has had GI w/up previously.  Follow cbc.   Atrial fibrillation Followed by cardiology.  Off coumadin secondary to GI bleed.  Stable.   CAD (coronary artery disease) Followed by cardiology.  Stable.    Chronic bronchitis ([facility]) Sees pulmonary.  Diagnosed with chronic bronchitis with excessive mucus production.  Breathing overall stable. Nose is dry.  Stop astelin.  Saline nasal spray and flonase as directed.  Follow.    Essential hypertension Blood pressure under good control.  Continue same medication regimen.  Follow pressures.  Follow metabolic panel.    Hypercholesteremia On lipitor.  Low cholesterol diet and exercise.  Follow lipid panel and liver function tests.    Hyperglycemia Follow met b and a1c.   Progressive supranuclear palsy ([place]) Followed by neurology.  Overall stable.    Thoracic aortic atherosclerosis ([facility]) On lipitor.   Lower extremity edema Increased swelling as outlined.  Breathing overall stable.  Takes lasix prn.  Will take lasix for the next three days.  Follow.  Check metabolic panel beginning of next week.  Notify me if worsening.  Also discussed leg elevation.      I discussed the assessment and  treatment plan with the patient. The patient was provided an opportunity to ask questions and all were answered. The patient agreed with the plan and demonstrated an understanding of the instructions.   The patient was advised to call back or seek an in-person evaluation if the symptoms worsen or if the condition fails to improve as anticipated.  I provided 25 minutes of non-face-to-face time during this encounter.   [name], MD

## 2018-12-29 NOTE — Telephone Encounter (Signed)
Lm for pt to call back and schedule non fasting labs next week and a 59m follow up

## 2018-12-30 ENCOUNTER — Telehealth: Payer: Self-pay | Admitting: Internal Medicine

## 2018-12-30 NOTE — Telephone Encounter (Signed)
Pt wife called a said that pt legs were worse today than yesterday

## 2018-12-31 NOTE — Telephone Encounter (Signed)
Spoke with wife. Legs were a little swollen but no pain, redness, warmth to touch, etc. Confirmed no other acute issues. PT is supposed to wear compression stockings and try to keep legs elevated when sitting. Pt did not have stockings on until later today. Going to put on stockings, elevate legs, call if swelling worsens or persists.

## 2019-01-01 ENCOUNTER — Telehealth: Payer: Self-pay | Admitting: *Deleted

## 2019-01-01 ENCOUNTER — Other Ambulatory Visit: Payer: Self-pay | Admitting: Internal Medicine

## 2019-01-01 NOTE — Telephone Encounter (Signed)
Please place future orders for lab appt.  

## 2019-01-02 ENCOUNTER — Encounter: Payer: Self-pay | Admitting: Internal Medicine

## 2019-01-02 DIAGNOSIS — R600 Localized edema: Secondary | ICD-10-CM | POA: Insufficient documentation

## 2019-01-02 NOTE — Assessment & Plan Note (Signed)
Has had GI w/up previously.  Follow cbc.

## 2019-01-02 NOTE — Assessment & Plan Note (Signed)
On lipitor.  Low cholesterol diet and exercise.  Follow lipid panel and liver function tests.   

## 2019-01-02 NOTE — Assessment & Plan Note (Addendum)
Increased swelling as outlined.  Breathing overall stable.  Takes lasix prn.  Will take lasix for the next three days.  Follow.  Check metabolic panel beginning of next week.  Notify me if worsening.  Also discussed leg elevation.

## 2019-01-02 NOTE — Assessment & Plan Note (Signed)
Followed by neurology.  Overall stable.

## 2019-01-02 NOTE — Assessment & Plan Note (Signed)
Sees pulmonary.  Diagnosed with chronic bronchitis with excessive mucus production.  Breathing overall stable. Nose is dry.  Stop astelin.  Saline nasal spray and flonase as directed.  Follow.

## 2019-01-02 NOTE — Assessment & Plan Note (Signed)
Followed by cardiology. Stable.   

## 2019-01-02 NOTE — Assessment & Plan Note (Signed)
On lipitor

## 2019-01-02 NOTE — Telephone Encounter (Signed)
Orders placed for labs

## 2019-01-02 NOTE — Assessment & Plan Note (Signed)
Followed by cardiology.  Off coumadin secondary to GI bleed.  Stable.

## 2019-01-02 NOTE — Assessment & Plan Note (Signed)
Follow met b and a1c.  

## 2019-01-02 NOTE — Assessment & Plan Note (Signed)
Blood pressure under good control.  Continue same medication regimen.  Follow pressures.  Follow metabolic panel.   

## 2019-01-04 ENCOUNTER — Other Ambulatory Visit: Payer: Medicare HMO

## 2019-01-04 ENCOUNTER — Telehealth: Payer: Self-pay | Admitting: Internal Medicine

## 2019-01-04 ENCOUNTER — Emergency Department: Payer: Medicare HMO

## 2019-01-04 ENCOUNTER — Emergency Department
Admission: EM | Admit: 2019-01-04 | Discharge: 2019-01-04 | Disposition: A | Discharge status: Home / Self Care | Payer: Medicare HMO | Attending: Emergency Medicine | Admitting: Emergency Medicine

## 2019-01-04 ENCOUNTER — Other Ambulatory Visit: Payer: Self-pay

## 2019-01-04 DIAGNOSIS — I509 Heart failure, unspecified: Secondary | ICD-10-CM | POA: Diagnosis not present

## 2019-01-04 DIAGNOSIS — Z951 Presence of aortocoronary bypass graft: Secondary | ICD-10-CM | POA: Diagnosis not present

## 2019-01-04 DIAGNOSIS — Z79899 Other long term (current) drug therapy: Secondary | ICD-10-CM | POA: Insufficient documentation

## 2019-01-04 DIAGNOSIS — I2510 Atherosclerotic heart disease of native coronary artery without angina pectoris: Secondary | ICD-10-CM | POA: Insufficient documentation

## 2019-01-04 DIAGNOSIS — I4891 Unspecified atrial fibrillation: Secondary | ICD-10-CM | POA: Diagnosis not present

## 2019-01-04 DIAGNOSIS — Z87891 Personal history of nicotine dependence: Secondary | ICD-10-CM | POA: Diagnosis not present

## 2019-01-04 DIAGNOSIS — R2243 Localized swelling, mass and lump, lower limb, bilateral: Secondary | ICD-10-CM | POA: Insufficient documentation

## 2019-01-04 DIAGNOSIS — I4510 Unspecified right bundle-branch block: Secondary | ICD-10-CM | POA: Diagnosis not present

## 2019-01-04 DIAGNOSIS — R609 Edema, unspecified: Secondary | ICD-10-CM | POA: Diagnosis not present

## 2019-01-04 DIAGNOSIS — I110 Hypertensive heart disease with heart failure: Secondary | ICD-10-CM | POA: Insufficient documentation

## 2019-01-04 DIAGNOSIS — R0602 Shortness of breath: Secondary | ICD-10-CM | POA: Diagnosis not present

## 2019-01-04 DIAGNOSIS — I10 Essential (primary) hypertension: Secondary | ICD-10-CM | POA: Diagnosis not present

## 2019-01-04 LAB — COMPREHENSIVE METABOLIC PANEL
ALT: 10 U/L (ref 0–44)
AST: 42 U/L — ABNORMAL HIGH (ref 15–41)
Albumin: 3.4 g/dL — ABNORMAL LOW (ref 3.5–5.0)
Alkaline Phosphatase: 81 U/L (ref 38–126)
Anion gap: 11 (ref 5–15)
BUN: 16 mg/dL (ref 8–23)
CO2: 24 mmol/L (ref 22–32)
Calcium: 8.4 mg/dL — ABNORMAL LOW (ref 8.9–10.3)
Chloride: 98 mmol/L (ref 98–111)
Creatinine, Ser: 0.69 mg/dL (ref 0.61–1.24)
GFR calc Af Amer: >60 mL/min (ref >60)
GFR calc non Af Amer: >60 mL/min (ref >60)
Glucose, Bld: 142 mg/dL — ABNORMAL HIGH (ref 70–99)
Potassium: 3.5 mmol/L (ref 3.5–5.1)
Sodium: 133 mmol/L — ABNORMAL LOW (ref 135–145)
Total Bilirubin: 0.6 mg/dL (ref 0.3–1.2)
Total Protein: 6.7 g/dL (ref 6.5–8.1)

## 2019-01-04 LAB — CBC WITH DIFFERENTIAL/PLATELET
Abs Immature Granulocytes: 0.07 10*3/uL (ref 0.00–0.07)
Basophils Absolute: 0 10*3/uL (ref 0.0–0.1)
Basophils Relative: 0 %
Eosinophils Absolute: 0.1 10*3/uL (ref 0.0–0.5)
Eosinophils Relative: 2 %
HCT: 31.2 % — ABNORMAL LOW (ref 39.0–52.0)
Hemoglobin: 10.5 g/dL — ABNORMAL LOW (ref 13.0–17.0)
Immature Granulocytes: 1 %
Lymphocytes Relative: 7 %
Lymphs Abs: 0.5 10*3/uL — ABNORMAL LOW (ref 0.7–4.0)
MCH: 31.7 pg (ref 26.0–34.0)
MCHC: 33.7 g/dL (ref 30.0–36.0)
MCV: 94.3 fL (ref 80.0–100.0)
Monocytes Absolute: 0.8 10*3/uL (ref 0.1–1.0)
Monocytes Relative: 12 %
Neutro Abs: 5.3 10*3/uL (ref 1.7–7.7)
Neutrophils Relative %: 78 %
Platelets: 349 10*3/uL (ref 150–400)
RBC: 3.31 MIL/uL — ABNORMAL LOW (ref 4.22–5.81)
RDW: 13.6 % (ref 11.5–15.5)
WBC: 6.9 10*3/uL (ref 4.0–10.5)
nRBC: 0 % (ref 0.0–0.2)

## 2019-01-04 LAB — BRAIN NATRIURETIC PEPTIDE: B Natriuretic Peptide: 294 pg/mL — ABNORMAL HIGH (ref 0.0–100.0)

## 2019-01-04 LAB — TROPONIN I (HIGH SENSITIVITY): Troponin I (High Sensitivity): 16 ng/L (ref <18)

## 2019-01-04 MED ORDER — POTASSIUM CHLORIDE CRYS ER 20 MEQ PO TBCR
20.0000 meq | EXTENDED_RELEASE_TABLET | Freq: Once | ORAL | Status: AC
  Administered 2019-01-04: 19:00:00 20 meq via ORAL
  Filled 2019-01-04: qty 1

## 2019-01-04 MED ORDER — FUROSEMIDE 10 MG/ML IJ SOLN
40.0000 mg | Freq: Once | INTRAMUSCULAR | Status: AC
  Administered 2019-01-04: 40 mg via INTRAVENOUS
  Filled 2019-01-04: qty 4

## 2019-01-04 NOTE — Telephone Encounter (Signed)
Dr. Nicki Reaper put fluid pill and it is not helping. Legs are still swelling, rt leg is swelling to the top of leg. Patient has lab appointment today at 2:30 today.

## 2019-01-04 NOTE — ED Triage Notes (Signed)
Pt c/o sob that started "a month ago." Pt denies home o2 use, reports intermittant dizziness. Pt denies cough, CP. Pt aox4, nad noted.

## 2019-01-04 NOTE — ED Triage Notes (Signed)
Pt comes into the ED via EMS from home with c/o increased LEE even after having lasix increased, when fire arrived at sats were reported at 89-90% on RA, pt is sating 100% on 3L [place] on arrival. Pt is a/ox4.

## 2019-01-04 NOTE — Discharge Instructions (Addendum)
Please seek medical attention for any high fevers, chest pain, shortness of breath, change in behavior, persistent vomiting, bloody stool or any other new or concerning symptoms.  

## 2019-01-04 NOTE — ED Notes (Signed)

## 2019-01-04 NOTE — Telephone Encounter (Signed)
Left message for daughter to call back.  

## 2019-01-04 NOTE — Telephone Encounter (Signed)
Lab appt today is 2pm not 2:30. Please call pt's daughter Olivia Mackie at [phone].

## 2019-01-04 NOTE — ED Provider Notes (Signed)
[facility] Emergency Department Provider Note   ____________________________________________   I have reviewed the triage vital signs and the nursing notes.   HISTORY  Chief Complaint Shortness of Breath   History limited by: Not Limited   HPI Tyler Stout is a 85 y.o. male who presents to the emergency department today because of concern for shortness of breath. He states he has been short of breath for a long time but it has gotten worse over the past couple of weeks. He has noticed some swelling in his lower extremities. The patient has been taking lasix for the past 3-4 days. The patient denies any chest pain. Does feel like his heart is beating irregularly but says that has been happening since he underwent heart surgery in the 1990s. The patient denies any fevers.    Records reviewed. Per medical record review patient has a history of atrial fibrillation, CAD.  Past Medical History:  Diagnosis Date  . Anemia    iron deficiency  . Arthritis   . Atrial fibrillation ([place])   . Bilateral renal cysts    worked up by Dr Bernardo Heater  . BPH (benign prostatic hypertrophy)   . CAD (coronary artery disease)    s/p bypass graft x 4 (1991)  . Diverticulosis   . GERD (gastroesophageal reflux disease)   . Glaucoma   . Hypercholesterolemia   . Hypertension   . Pancreatitis    x2 (1994 and 1998), presumed to be gallstone pancreatitis.  s/p ERCP and sphincterotomy    Patient Active Problem List   Diagnosis Date Noted  . Lower extremity edema 01/02/2019  . Chronic bronchitis ([place]) 10/10/2018  . Scalp lesion 10/10/2018  . Pneumonia 07/28/2018  . PNA (pneumonia) 05/07/2018  . Cough 05/05/2018  . Hyperglycemia 09/13/2017  . Decreased pedal pulses 04/01/2017  . Dizziness 02/07/2017  . Progressive supranuclear palsy ([place]) 11/02/2016  . Thoracic aortic atherosclerosis ([place]) 08/12/2016  . Abnormal CXR 07/18/2016  . Urinary frequency 06/20/2015  . Lower GI  bleed   . Demand ischemia ([place])   . Chronic atrial fibrillation ([place])   . Essential hypertension   . HLD (hyperlipidemia)   . BPH (benign prostatic hyperplasia)   . Tremor 06/10/2015  . Loss of weight 06/10/2015  . GI bleed 06/08/2015  . Health care maintenance 08/11/2014  . Light headedness 03/19/2014  . Back pain 03/13/2013  . Hypokalemia 02/20/2013  . CAD (coronary artery disease) 12/07/2011  . Hyponatremia 12/07/2011  . Atrial fibrillation ([place]) 12/07/2011  . Hypertension 11/20/2011  . Hypercholesteremia 11/20/2011  . Anemia 11/20/2011    Past Surgical History:  Procedure Laterality Date  . APPENDECTOMY    . CHOLECYSTECTOMY  1994  . CORONARY ARTERY BYPASS GRAFT     x 4,  1991  . INGUINAL HERNIA REPAIR  2009  . open heart surgery  1991    Prior to Admission medications   Medication Sig Start Date End Date Taking? Authorizing Provider  amLODipine (NORVASC) 5 MG tablet TAKE 1 TABLET BY MOUTH EVERY DAY 10/11/18   [name], MD  Ascorbic Acid (VITAMIN C PO) Take 1 tablet by mouth daily. Reported on 06/20/2015    [provider]  atorvastatin (LIPITOR) 20 MG tablet TAKE 1 TABLET (20 MG TOTAL) BY MOUTH DAILY. 01/03/19   [name], MD  azelastine (ASTELIN) 0.1 % nasal spray PLACE 1 SPRAY INTO BOTH NOSTRILS 2 (TWO) TIMES DAILY 12/17/18   [name], MD  BREO ELLIPTA 200-25 MCG/INH AEPB Inhale 1  puff into the lungs daily. 12/28/18   Flora Lipps, MD  carbidopa-levodopa (SINEMET IR) 25-100 MG tablet TAKE 2 TABLETS BY MOUTH 3 TIMES DAILY 04/10/17   [provider]  citalopram (CELEXA) 20 MG tablet TAKE 1 TABLET (20 MG TOTAL) BY MOUTH DAILY. 07/29/18   [name], MD  fluticasone (FLONASE) 50 MCG/ACT nasal spray SPRAY 2 SPRAYS INTO EACH NOSTRIL EVERY DAY 12/02/18   [name], MD  Fluticasone-Umeclidin-Vilant (TRELEGY ELLIPTA) 100-62.5-25 MCG/INH AEPB Inhale 1 applicator into the lungs daily. Rinse mouth after use. 08/11/18   Laverle Hobby, MD  furosemide (LASIX) 20 MG tablet Take 20 mg by mouth daily.     [provider]  latanoprost (XALATAN) 0.005 % ophthalmic solution Place 1 drop into both eyes at bedtime.     [provider]  metoprolol tartrate (LOPRESSOR) 25 MG tablet TAKE 1 TABLET BY MOUTH TWICE A DAY 05/27/16   [provider]  Multiple Vitamin (MULTIVITAMIN WITH MINERALS) TABS tablet Take 1 tablet by mouth daily.    [provider]  pantoprazole (PROTONIX) 40 MG tablet TAKE 1 TABLET BY MOUTH DAILY 05/13/18   [name], MD  tamsulosin (FLOMAX) 0.4 MG CAPS capsule TAKE 1 CAPSULE BY MOUTH EVERY DAY 11/22/18   [name], MD    Allergies Ilosone [erythromycin] and Penicillins  Family History  Problem Relation Age of Onset  . Throat cancer Father        oropharyngeal cancer  . Kidney disease Sister   . Heart disease Other   . Colon cancer Neg Hx   . Prostate cancer Neg Hx     Social History Social History   Tobacco Use  . Smoking status: Former Smoker    Packs/day: 2.00    Years: 25.00    Pack years: 50.00    Types: Cigarettes    Quit date: 01/14/1964    Years since quitting: 55.0  . Smokeless tobacco: Never Used  . Tobacco comment: smoked 2 packs/day, 1 pipe and 1 cigar once in a while.  Substance Use Topics  . Alcohol use: No    Alcohol/week: 0.0 standard drinks  . Drug use: No    Review of Systems Constitutional: No fever/chills Eyes: No visual changes. ENT: No sore throat. Cardiovascular: Denies chest pain. Respiratory: Positive for shortness of breath. Gastrointestinal: No abdominal pain.  No nausea, no vomiting.  No diarrhea.   Genitourinary: Negative for dysuria. Musculoskeletal: Positive for lower extremity edema.  Skin: Negative for rash. Neurological: Negative for headaches, focal weakness or numbness.  ____________________________________________   PHYSICAL EXAM:  VITAL SIGNS: ED Triage Vitals  Enc Vitals Group     BP  01/04/19 1247 (!) 125/51     Pulse Rate 01/04/19 1247 (!) 32     Resp 01/04/19 1247 20     Temp 01/04/19 1247 97.7 F (36.5 C)     Temp Source 01/04/19 1247 Oral     SpO2 01/04/19 1247 96 %     Weight 01/04/19 1248 150 lb (68 kg)     Height 01/04/19 1248 5\' 5"  (1.651 m)     Head Circumference --      [place] Flow --      Pain Score 01/04/19 1248 0   Constitutional: Alert and oriented.  Eyes: Conjunctivae are normal.  ENT      Head: Normocephalic and atraumatic.      Nose: No congestion/rhinnorhea.      Mouth/Throat: Mucous membranes are moist.      Neck:  No stridor. Hematological/Lymphatic/Immunilogical: No cervical lymphadenopathy. Cardiovascular: Normal rate, irregular rhythm.  No murmurs, rubs, or gallops.  Respiratory: Normal respiratory effort without tachypnea nor retractions. Breath sounds are clear and equal bilaterally. No wheezes/rales/rhonchi. Gastrointestinal: Soft and non tender. No rebound. No guarding.  Genitourinary: Deferred Musculoskeletal: Normal range of motion in all extremities. 1+ bilateral lower extremity edema. Neurologic:  Normal speech and language. No gross focal neurologic deficits are appreciated.  Skin:  Skin is warm, dry and intact. No rash noted. Psychiatric: Mood and affect are normal. Speech and behavior are normal. Patient exhibits appropriate insight and judgment.  ____________________________________________    LABS (pertinent positives/negatives)  CMP na 133, glu 142, ca 8.4, alb 3.4, ast 42 otherwise wnl CBC wbc 6.9, hgb 10.5, plt 349 Trop hs 16 BNP 294.0 ____________________________________________   EKG  I, Nance Pear, attending physician, personally viewed and interpreted this EKG  EKG Time: 1246 Rate: 65 Rhythm: question slow atrial flutter vs higher degree AV block Axis: left axis deviation Intervals: qtc 520 QRS: RBBB ST changes: no st elevation Impression: abnormal  ekg   ____________________________________________    RADIOLOGY  CXR Probably mild interstitial edema  ____________________________________________   PROCEDURES  Procedures  ____________________________________________   INITIAL IMPRESSION / ASSESSMENT AND PLAN / ED COURSE  Pertinent labs & imaging results that were available during my care of the patient were reviewed by me and considered in my medical decision making (see chart for details).   Patient presented to the emergency department today because of concerns for shortness of breath.  Patient also has some lower extremity edema.  Patient's BNP was minimally elevated.  Negative troponin.  Chest x-ray without any focal infiltrate although did show some mild interstitial edema.  This point I think patient likely suffering from slight fluid overload.  Will give dose of IV Lasix here.  Patient is not hypoxic here in the emergency department.  Will give patient discharge follow-up with heart failure clinic.  Discussed findings and plan with patient.  ____________________________________________   FINAL CLINICAL IMPRESSION(S) / ED DIAGNOSES  Final diagnoses:  Congestive heart failure, unspecified HF chronicity, unspecified heart failure type ([place])  Shortness of breath     Note: This dictation was prepared with Dragon dictation. Any transcriptional errors that result from this process are unintentional     Nance Pear, MD 01/04/19 1751

## 2019-01-04 NOTE — Telephone Encounter (Signed)
Spoke with patients daughter. Patients daughter stated that they spoke with Dr Leamon Arnt office and they recommended ED evaluation. Advised that Dr Nicki Reaper agrees that he should be evaluated as well. Patients grand daughter is going over to their house to take him to ED.

## 2019-01-05 ENCOUNTER — Telehealth: Payer: Self-pay | Admitting: Internal Medicine

## 2019-01-05 ENCOUNTER — Other Ambulatory Visit: Payer: Self-pay

## 2019-01-05 MED ORDER — POTASSIUM CHLORIDE ER 10 MEQ PO TBCR: EXTENDED_RELEASE_TABLET | ORAL | 0 refills | Status: AC

## 2019-01-05 NOTE — Telephone Encounter (Signed)
Left message for patient's daughter to call back. 

## 2019-01-05 NOTE — Telephone Encounter (Signed)
Daughter and grand daughter called to let me know that he was given IV lasix yesterday, scheduled follow up with Dr Ubaldo Glassing on 12/29. He also got potassium while he was in the ED because his potassium was 3.5. [place] daughter was concerned about pt needing O2. Advised that we would have to do a walk on him in order to see if he qualifies. Advised them that PCP would have to look at labs and let me know if he needs to continue taking lasix daily until he sees Dr Ubaldo Glassing.

## 2019-01-05 NOTE — Telephone Encounter (Signed)
Patient does not have any potassium supplements. Breathing is ok and he is doing better since the IV lasix but his sats are dropping to 84% on room air ambulating. 96% at rest. Discussed getting oxygen for him but he will need a walk for documentation. Pt will follow up with Dr Ubaldo Glassing on 12/29. I have sent in his potassium

## 2019-01-05 NOTE — Telephone Encounter (Signed)
Called and spoke to Tyler Stout and Tyler Stout Comment (Granddaughter).  Tyler Grieshaber states that Tyler Stout is feeling some better now - compared to this am.  He just went to bathroom.  Pulse ox after walking back - 96%.  Does not feel needs ER evaluation at this time.  Swelling has improved.  I spoke to [place].  She reports that Tyler Stout does get sob with increased exertion.  Has appt with Dr Ubaldo Glassing next week.  Will see if they can walk him and document pulse ox with ambulation.  Will try every other day lasix as discussed.  Any change or worsening symptoms, he will be evaluated.

## 2019-01-05 NOTE — Telephone Encounter (Signed)
Reviewed ER note, EKG, labs and xray.  Kidney function ok.  Does he have any potassium supplements at home?  Per ER note, his oxygen saturation 96%.  This would not qualify him for oxygen.  EKG - pulse rate 65.  Need to know how he is doing.  If better and breathing better, can continue lasix every other day until appt with Dr Ubaldo Glassing.  Will need to take potassium supplement every other day with lasix.  Further instructions with continuing the lasix to be determined after Dr Bethanne Ginger assessment.

## 2019-01-05 NOTE — Telephone Encounter (Signed)
Pt's daughter called to inform that pt visited ER 01/04/2019-congestive heart failure/wants to know if he should continue Lasix.

## 2019-01-05 NOTE — Telephone Encounter (Signed)
Noted  

## 2019-01-10 ENCOUNTER — Telehealth: Payer: Self-pay | Admitting: Family

## 2019-01-10 NOTE — Telephone Encounter (Signed)
Called and Left Two VM one on 12/23 and again on 12/28 to follow up from ED visit and to schedule a New Patient appt based off referral we received from ED. Left message asking Pt to call back and schedule appt.   [employer] 01/10/2019

## 2019-01-10 NOTE — Telephone Encounter (Signed)
Called and left two separate messages one on 12/23 and again on 12/28 to call us back and schedule a New Patient Appt based off Referral from ED.   Tyler Stout [employer]

## 2019-01-11 DIAGNOSIS — I480 Paroxysmal atrial fibrillation: Secondary | ICD-10-CM | POA: Diagnosis not present

## 2019-01-11 DIAGNOSIS — I951 Orthostatic hypotension: Secondary | ICD-10-CM | POA: Diagnosis not present

## 2019-01-11 DIAGNOSIS — I10 Essential (primary) hypertension: Secondary | ICD-10-CM | POA: Diagnosis not present

## 2019-01-11 DIAGNOSIS — K31819 Angiodysplasia of stomach and duodenum without bleeding: Secondary | ICD-10-CM | POA: Diagnosis not present

## 2019-01-11 DIAGNOSIS — I5022 Chronic systolic (congestive) heart failure: Secondary | ICD-10-CM | POA: Diagnosis not present

## 2019-01-11 DIAGNOSIS — I2510 Atherosclerotic heart disease of native coronary artery without angina pectoris: Secondary | ICD-10-CM | POA: Diagnosis not present

## 2019-01-18 DIAGNOSIS — I5022 Chronic systolic (congestive) heart failure: Secondary | ICD-10-CM | POA: Diagnosis not present

## 2019-01-21 DIAGNOSIS — M7541 Impingement syndrome of right shoulder: Secondary | ICD-10-CM | POA: Diagnosis not present

## 2019-03-08 ENCOUNTER — Telehealth: Payer: Self-pay | Admitting: Internal Medicine

## 2019-03-08 NOTE — Telephone Encounter (Signed)
I have left two message for pt to call back and set up a Doxy for his 2/25 appt

## 2019-03-10 ENCOUNTER — Ambulatory Visit (INDEPENDENT_AMBULATORY_CARE_PROVIDER_SITE_OTHER): Payer: Medicare HMO | Admitting: Internal Medicine

## 2019-03-10 ENCOUNTER — Encounter: Payer: Self-pay | Admitting: Internal Medicine

## 2019-03-10 DIAGNOSIS — J411 Mucopurulent chronic bronchitis: Secondary | ICD-10-CM

## 2019-03-10 DIAGNOSIS — I4891 Unspecified atrial fibrillation: Secondary | ICD-10-CM

## 2019-03-10 DIAGNOSIS — E7800 Pure hypercholesterolemia, unspecified: Secondary | ICD-10-CM

## 2019-03-10 DIAGNOSIS — D508 Other iron deficiency anemias: Secondary | ICD-10-CM | POA: Diagnosis not present

## 2019-03-10 DIAGNOSIS — I700 Atherosclerosis of aorta: Secondary | ICD-10-CM

## 2019-03-10 DIAGNOSIS — R600 Localized edema: Secondary | ICD-10-CM

## 2019-03-10 DIAGNOSIS — R739 Hyperglycemia, unspecified: Secondary | ICD-10-CM

## 2019-03-10 DIAGNOSIS — R42 Dizziness and giddiness: Secondary | ICD-10-CM

## 2019-03-10 DIAGNOSIS — I2510 Atherosclerotic heart disease of native coronary artery without angina pectoris: Secondary | ICD-10-CM | POA: Diagnosis not present

## 2019-03-10 DIAGNOSIS — I10 Essential (primary) hypertension: Secondary | ICD-10-CM

## 2019-03-10 DIAGNOSIS — G231 Progressive supranuclear ophthalmoplegia [Steele-Richardson-Olszewski]: Secondary | ICD-10-CM

## 2019-03-10 NOTE — Progress Notes (Signed)
[name]: Tyler Stout, male   DOB: 03-20-1925, 86 y.o.   MRN: [id number]   Virtual Visit via telephone Note  This visit type was conducted due to national recommendations for restrictions regarding the COVID-19 pandemic (e.g. social distancing).  This format is felt to be most appropriate for this patient at this time.  All issues noted in this document were discussed and addressed.  No physical exam was performed (except for noted visual exam findings with Video Visits).   I connected with [name] by telephone and verified that I am speaking with the correct person using two identifiers. Location patient: home Location provider: work  Persons participating in the virtual visit: patient, provider and pts wife [name]  I discussed the limitations, risks, security and privacy concerns of performing an evaluation and management service by telephone and the availability of in person appointments. The patient expressed understanding and agreed to proceed.   Reason for visit: scheduled follow up.    HPI: Has a history of afib not anticoagulated secondary to GI bleeding.  Regarding afib - - well controlled.  Recent echo - EF 45-50% with mild AI, MR and TR with mild to moderate pulmonary hypertension.  No chest pain.  Was having issues with increased lower extremity swelling.  Recommended continuing lasix and changed to daily dosing.  Also recommended leg elevation and compression hose.  States legs are almost back to normal.  Weight back down.  On telmisartan now - 37m q day.  Feels better.  Breathing stable.  Eating.  Blood pressure 114-140/50-60s.  No abdominal pain.      ROS: See pertinent positives and negatives per HPI.  Past Medical History:  Diagnosis Date  . Anemia    iron deficiency  . Arthritis   . [place]   . Bilateral renal cysts    worked up by Dr SBernardo Heater . BPH (benign prostatic hypertrophy)   . CAD (coronary artery disease)    s/p bypass graft x 4  (1991)  . Diverticulosis   . GERD (gastroesophageal reflux disease)   . Glaucoma   . Hypercholesterolemia   . Hypertension   . Pancreatitis    x2 (1994 and 1998), presumed to be gallstone pancreatitis.  s/p ERCP and sphincterotomy    Past Surgical History:  Procedure Laterality Date  . APPENDECTOMY    . CHOLECYSTECTOMY  1994  . CORONARY ARTERY BYPASS GRAFT     x 4,  1991  . INGUINAL HERNIA REPAIR  2009  . open heart surgery  1991    Family History  Problem Relation Age of Onset  . Throat cancer Father        oropharyngeal cancer  . Kidney disease Sister   . Heart disease Other   . Colon cancer Neg Hx   . Prostate cancer Neg Hx     SOCIAL HX: reviewed.    Current Outpatient Medications:  .  amLODipine (NORVASC) 5 MG tablet, TAKE 1 TABLET BY MOUTH EVERY DAY, Disp: 90 tablet, Rfl: 1 .  Ascorbic Acid (VITAMIN C PO), Take 1 tablet by mouth daily. Reported on 06/20/2015, Disp: , Rfl:  .  atorvastatin (LIPITOR) 20 MG tablet, TAKE 1 TABLET (20 MG TOTAL) BY MOUTH DAILY., Disp: 90 tablet, Rfl: 3 .  azelastine (ASTELIN) 0.1 % nasal spray, PLACE 1 SPRAY INTO BOTH NOSTRILS 2 (TWO) TIMES DAILY, Disp: 30 mL, Rfl: 1 .  BREO ELLIPTA 200-25 MCG/INH AEPB, Inhale 1 puff into the lungs daily., Disp: 60  each, Rfl: 2 .  carbidopa-levodopa (SINEMET IR) 25-100 MG tablet, TAKE 2 TABLETS BY MOUTH 3 TIMES DAILY, Disp: , Rfl: 2 .  citalopram (CELEXA) 20 MG tablet, TAKE 1 TABLET (20 MG TOTAL) BY MOUTH DAILY., Disp: 90 tablet, Rfl: 2 .  fluticasone (FLONASE) 50 MCG/ACT nasal spray, SPRAY 2 SPRAYS INTO EACH NOSTRIL EVERY DAY, Disp: 48 mL, Rfl: 1 .  Fluticasone-Umeclidin-Vilant (TRELEGY ELLIPTA) 100-62.5-25 MCG/INH AEPB, Inhale 1 applicator into the lungs daily. Rinse mouth after use., Disp: 1 each, Rfl: 10 .  furosemide (LASIX) 20 MG tablet, Take 20 mg by mouth daily. , Disp: , Rfl:  .  latanoprost (XALATAN) 0.005 % ophthalmic solution, Place 1 drop into both eyes at bedtime. , Disp: , Rfl:  .  metoprolol  tartrate (LOPRESSOR) 25 MG tablet, TAKE 1 TABLET BY MOUTH TWICE A DAY, Disp: , Rfl:  .  Multiple Vitamin (MULTIVITAMIN WITH MINERALS) TABS tablet, Take 1 tablet by mouth daily., Disp: , Rfl:  .  pantoprazole (PROTONIX) 40 MG tablet, TAKE 1 TABLET BY MOUTH DAILY, Disp: 90 tablet, Rfl: 3 .  potassium chloride (KLOR-CON) 10 MEQ tablet, Take one tablet by mouth with lasix., Disp: 30 tablet, Rfl: 0 .  tamsulosin (FLOMAX) 0.4 MG CAPS capsule, TAKE 1 CAPSULE BY MOUTH EVERY DAY, Disp: 90 capsule, Rfl: 1 .  telmisartan (MICARDIS) 20 MG tablet, Take 20 mg by mouth daily., Disp: , Rfl:   EXAM:  GENERAL: alert.  Sounds to be in no acute distress.  Answering questions appropriately.   PSYCH/NEURO: pleasant and cooperative, no obvious depression or anxiety, speech and thought processing grossly intact  ASSESSMENT AND PLAN:  Discussed the following assessment and plan:  Anemia Has had GI w/up previously.  Follow cbc.   Atrial fibrillation Off coumadin secondary to GI bleed.  Followed by cardiology.  On metoprolol.  Follow.  Stable.   CAD (coronary artery disease) Followed by cardiology.  Stable.    Chronic bronchitis ([place]) Diagnosed with chronic bronchitis - saw pulmonary.  Breathing overall stable.  mucinex for congestion.  Use nasal spray daily.  Follow.    Dizziness Chronic problem for him.  Extensive w/up previously.  Multifactorial.  Has progressive supranuclear palsy.  Continue compression hose.  Slow position changes and movements.  Use walking.    Essential hypertension Blood pressure doing well as outlined.  Continue metoprolol, telmisartan and lasix.  Follow pressures.  Follow metabolic panel.   Hypercholesteremia On lipitor.  Follow lipid panel and liver function tests.    Hyperglycemia Follow met b and a1c.   Lower extremity edema Improved on recent medication regimen with recent adjustment in lasix and now on telmisartan.  Almost back to norma.  Follow.    Thoracic aortic  atherosclerosis ([facility]) On lipitor.    Progressive supranuclear palsy ([place]) Followed by neurology.  Has f/u tomorrow.     Orders Placed This Encounter  Procedures  . CBC with Differential/Platelet    Standing Status:   Future    Standing Expiration Date:   03/12/2020  . Ferritin    Standing Status:   Future    Standing Expiration Date:   03/12/2020  . Hepatic function panel    Standing Status:   Future    Standing Expiration Date:   03/12/2020  . Lipid panel    Standing Status:   Future    Standing Expiration Date:   03/12/2020  . TSH    Standing Status:   Future    Standing Expiration Date:  03/12/2020  . Basic metabolic panel    Standing Status:   Future    Standing Expiration Date:   03/12/2020  . Hemoglobin A1c    Standing Status:   Future    Standing Expiration Date:   03/12/2020     I discussed the assessment and treatment plan with the patient. The patient was provided an opportunity to ask questions and all were answered. The patient agreed with the plan and demonstrated an understanding of the instructions.   The patient was advised to call back or seek an in-person evaluation if the symptoms worsen or if the condition fails to improve as anticipated.  I provided 22 minutes of non-face-to-face time during this encounter.    , MD  

## 2019-03-10 NOTE — Telephone Encounter (Signed)
Pt has phone appt today

## 2019-03-13 ENCOUNTER — Encounter: Payer: Self-pay | Admitting: Internal Medicine

## 2019-03-13 NOTE — Assessment & Plan Note (Signed)
Chronic problem for him.  Extensive w/up previously.  Multifactorial.  Has progressive supranuclear palsy.  Continue compression hose.  Slow position changes and movements.  Use walking.

## 2019-03-13 NOTE — Assessment & Plan Note (Signed)
On lipitor.  Follow lipid panel and liver function tests.   

## 2019-03-13 NOTE — Assessment & Plan Note (Signed)
Followed by cardiology. Stable.   

## 2019-03-13 NOTE — Assessment & Plan Note (Signed)
Improved on recent medication regimen with recent adjustment in lasix and now on telmisartan.  Almost back to norma.  Follow.

## 2019-03-13 NOTE — Assessment & Plan Note (Signed)
Follow met b and a1c.  

## 2019-03-13 NOTE — Assessment & Plan Note (Signed)
Followed by neurology.  Has f/u tomorrow.   

## 2019-03-13 NOTE — Assessment & Plan Note (Signed)
Has had GI w/up previously.  Follow cbc.

## 2019-03-13 NOTE — Assessment & Plan Note (Signed)
Diagnosed with chronic bronchitis - saw pulmonary.  Breathing overall stable.  mucinex for congestion.  Use nasal spray daily.  Follow.

## 2019-03-13 NOTE — Assessment & Plan Note (Signed)
Off coumadin secondary to GI bleed.  Followed by cardiology.  On metoprolol.  Follow.  Stable.

## 2019-03-13 NOTE — Assessment & Plan Note (Signed)
Blood pressure doing well as outlined.  Continue metoprolol, telmisartan and lasix.  Follow pressures.  Follow metabolic panel.

## 2019-03-13 NOTE — Assessment & Plan Note (Signed)
On lipitor

## 2019-03-18 ENCOUNTER — Other Ambulatory Visit: Payer: Medicare HMO

## 2019-04-05 ENCOUNTER — Other Ambulatory Visit: Payer: Self-pay | Admitting: Internal Medicine

## 2019-04-09 ENCOUNTER — Other Ambulatory Visit: Payer: Self-pay | Admitting: Internal Medicine

## 2019-04-28 ENCOUNTER — Encounter: Payer: Self-pay | Admitting: Internal Medicine

## 2019-04-28 ENCOUNTER — Other Ambulatory Visit: Payer: Self-pay

## 2019-04-28 ENCOUNTER — Ambulatory Visit (INDEPENDENT_AMBULATORY_CARE_PROVIDER_SITE_OTHER): Payer: Medicare HMO | Admitting: Internal Medicine

## 2019-04-28 DIAGNOSIS — D508 Other iron deficiency anemias: Secondary | ICD-10-CM

## 2019-04-28 DIAGNOSIS — J411 Mucopurulent chronic bronchitis: Secondary | ICD-10-CM

## 2019-04-28 DIAGNOSIS — I4891 Unspecified atrial fibrillation: Secondary | ICD-10-CM | POA: Diagnosis not present

## 2019-04-28 DIAGNOSIS — I10 Essential (primary) hypertension: Secondary | ICD-10-CM | POA: Diagnosis not present

## 2019-04-28 DIAGNOSIS — R739 Hyperglycemia, unspecified: Secondary | ICD-10-CM | POA: Diagnosis not present

## 2019-04-28 DIAGNOSIS — R600 Localized edema: Secondary | ICD-10-CM

## 2019-04-28 DIAGNOSIS — I2510 Atherosclerotic heart disease of native coronary artery without angina pectoris: Secondary | ICD-10-CM

## 2019-04-28 DIAGNOSIS — E7800 Pure hypercholesterolemia, unspecified: Secondary | ICD-10-CM

## 2019-04-28 DIAGNOSIS — G231 Progressive supranuclear ophthalmoplegia [Steele-Richardson-Olszewski]: Secondary | ICD-10-CM

## 2019-04-28 DIAGNOSIS — I700 Atherosclerosis of aorta: Secondary | ICD-10-CM

## 2019-04-28 LAB — CBC WITH DIFFERENTIAL/PLATELET
Basophils Absolute: 0 10*3/uL (ref 0.0–0.1)
Basophils Relative: 0.5 % (ref 0.0–3.0)
Eosinophils Absolute: 0 10*3/uL (ref 0.0–0.7)
Eosinophils Relative: 0.4 % (ref 0.0–5.0)
HCT: 37.2 % — ABNORMAL LOW (ref 39.0–52.0)
Hemoglobin: 12.3 g/dL — ABNORMAL LOW (ref 13.0–17.0)
Lymphocytes Relative: 18.3 % (ref 12.0–46.0)
Lymphs Abs: 1 10*3/uL (ref 0.7–4.0)
MCHC: 33.2 g/dL (ref 30.0–36.0)
MCV: 95.2 fl (ref 78.0–100.0)
Monocytes Absolute: 0.7 10*3/uL (ref 0.1–1.0)
Monocytes Relative: 13.1 % — ABNORMAL HIGH (ref 3.0–12.0)
Neutro Abs: 3.7 10*3/uL (ref 1.4–7.7)
Neutrophils Relative %: 67.7 % (ref 43.0–77.0)
Platelets: 191 10*3/uL (ref 150.0–400.0)
RBC: 3.91 Mil/uL — ABNORMAL LOW (ref 4.22–5.81)
RDW: 15.6 % — ABNORMAL HIGH (ref 11.5–15.5)
WBC: 5.4 10*3/uL (ref 4.0–10.5)

## 2019-04-28 LAB — BASIC METABOLIC PANEL
BUN: 17 mg/dL (ref 6–23)
CO2: 29 mEq/L (ref 19–32)
Calcium: 8.8 mg/dL (ref 8.4–10.5)
Chloride: 101 mEq/L (ref 96–112)
Creatinine, Ser: 0.87 mg/dL (ref 0.40–1.50)
GFR: 82.1 mL/min (ref >60.00)
Glucose, Bld: 107 mg/dL — ABNORMAL HIGH (ref 70–99)
Potassium: 4 mEq/L (ref 3.5–5.1)
Sodium: 136 mEq/L (ref 135–145)

## 2019-04-28 LAB — FERRITIN: Ferritin: 76.8 ng/mL (ref 22.0–322.0)

## 2019-04-28 LAB — HEPATIC FUNCTION PANEL
ALT: 6 U/L (ref 0–53)
AST: 18 U/L (ref 0–37)
Albumin: 4.1 g/dL (ref 3.5–5.2)
Alkaline Phosphatase: 79 U/L (ref 39–117)
Bilirubin, Direct: 0.2 mg/dL (ref 0.0–0.3)
Total Bilirubin: 0.7 mg/dL (ref 0.2–1.2)
Total Protein: 6 g/dL (ref 6.0–8.3)

## 2019-04-28 LAB — LIPID PANEL
Cholesterol: 116 mg/dL (ref 0–200)
HDL: 47.9 mg/dL (ref >39.00)
LDL Cholesterol: 51 mg/dL (ref 0–99)
NonHDL: 67.6
Total CHOL/HDL Ratio: 2
Triglycerides: 82 mg/dL (ref 0.0–149.0)
VLDL: 16.4 mg/dL (ref 0.0–40.0)

## 2019-04-28 LAB — TSH: TSH: 1.06 u[IU]/mL (ref 0.35–4.50)

## 2019-04-28 LAB — HEMOGLOBIN A1C: Hgb A1c MFr Bld: 6.3 % (ref 4.6–6.5)

## 2019-04-28 NOTE — Progress Notes (Signed)
[name]: Tyler Stout, male   DOB: April 07, 1925, 86 y.o.   MRN: [id number]   Subjective:    [name]: Tyler Stout, male    DOB: [birth year], 86 y.o.   MRN: [id number]  HPI This visit occurred during the SARS-CoV-2 public health emergency.  Safety protocols were in place, including screening questions prior to the visit, additional usage of staff PPE, and extensive cleaning of exam room while observing appropriate contact time as indicated for disinfecting solutions.  Patient here for a scheduled follow up.  He is accompanied by his daughter.  History obtained from Stout of them.  He is doing better. Daughter managing his medication.  Taking regularly now.  Breathing better.  Decreased swelling.  Denies chest pain. No increased heart rate or palpitations reported.  Eating.  No nausea or vomiting.  Bowels appear to be stable.  Sees neurology for f/u PSP.  Last evaluated 03/11/19. Followed by cardiology - Dr Ubaldo Glassing.    Past Medical History:  Diagnosis Date  . Anemia    iron deficiency  . Arthritis   . Atrial fibrillation ([place])   . Bilateral renal cysts    worked up by Dr Bernardo Heater  . BPH (benign prostatic hypertrophy)   . CAD (coronary artery disease)    s/p bypass graft x 4 (1991)  . Diverticulosis   . GERD (gastroesophageal reflux disease)   . Glaucoma   . Hypercholesterolemia   . Hypertension   . Pancreatitis    x2 (1994 and 1998), presumed to be gallstone pancreatitis.  s/p ERCP and sphincterotomy   Past Surgical History:  Procedure Laterality Date  . APPENDECTOMY    . CHOLECYSTECTOMY  1994  . CORONARY ARTERY BYPASS GRAFT     x 4,  1991  . INGUINAL HERNIA REPAIR  2009  . open heart surgery  1991   Family History  Problem Relation Age of Onset  . Throat cancer Father        oropharyngeal cancer  . Kidney disease Sister   . Heart disease Other   . Colon cancer Neg Hx   . Prostate cancer Neg Hx    Social History   Socioeconomic History  . Marital status: Married   Spouse name: Not on file  . Number of children: Not on file  . Years of education: Not on file  . Highest education level: Not on file  Occupational History  . Not on file  Tobacco Use  . Smoking status: Former Smoker    Packs/day: 2.00    Years: 25.00    Pack years: 50.00    Types: Cigarettes    Quit date: 01/14/1964    Years since quitting: 55.3  . Smokeless tobacco: Never Used  . Tobacco comment: smoked 2 packs/day, 1 pipe and 1 cigar once in a while.  Substance and Sexual Activity  . Alcohol use: No    Alcohol/week: 0.0 standard drinks  . Drug use: No  . Sexual activity: Not on file  Other Topics Concern  . Not on file  Social History Narrative  . Not on file   Social Determinants of Health   Financial Resource Strain:   . Difficulty of Paying Living Expenses:   Food Insecurity:   . Worried About Charity fundraiser in the Last Year:   . Arboriculturist in the Last Year:   Transportation Needs:   . Film/video editor (Medical):   Marland Kitchen Lack of Transportation (Non-Medical):   Physical Activity:   .  Days of Exercise per Week:   . Minutes of Exercise per Session:   Stress:   . Feeling of Stress :   Social Connections:   . Frequency of Communication with Friends and Family:   . Frequency of Social Gatherings with Friends and Family:   . Attends Religious Services:   . Active Member of Clubs or Organizations:   . Attends Archivist Meetings:   Marland Kitchen Marital Status:     Outpatient Encounter Medications as of 04/28/2019  Medication Sig  . Ascorbic Acid (VITAMIN C PO) Take 1 tablet by mouth daily. Reported on 06/20/2015  . atorvastatin (LIPITOR) 20 MG tablet TAKE 1 TABLET (20 MG TOTAL) BY MOUTH DAILY.  Marland Kitchen azelastine (ASTELIN) 0.1 % nasal spray PLACE 1 SPRAY INTO Stout NOSTRILS 2 (TWO) TIMES DAILY  . BREO ELLIPTA 200-25 MCG/INH AEPB Inhale 1 puff into the lungs daily.  . carbidopa-levodopa (SINEMET IR) 25-100 MG tablet TAKE 2 TABLETS BY MOUTH 3 TIMES DAILY  .  citalopram (CELEXA) 20 MG tablet TAKE 1 TABLET (20 MG TOTAL) BY MOUTH DAILY.  . fluticasone (FLONASE) 50 MCG/ACT nasal spray SPRAY 2 SPRAYS INTO EACH NOSTRIL EVERY DAY  . Fluticasone-Umeclidin-Vilant (TRELEGY ELLIPTA) 100-62.5-25 MCG/INH AEPB Inhale 1 applicator into the lungs daily. Rinse mouth after use.  . furosemide (LASIX) 20 MG tablet Take 20 mg by mouth daily.   Marland Kitchen latanoprost (XALATAN) 0.005 % ophthalmic solution Place 1 drop into Stout eyes at bedtime.   . metoprolol tartrate (LOPRESSOR) 25 MG tablet TAKE 1 TABLET BY MOUTH TWICE A DAY  . Multiple Vitamin (MULTIVITAMIN WITH MINERALS) TABS tablet Take 1 tablet by mouth daily.  . pantoprazole (PROTONIX) 40 MG tablet TAKE 1 TABLET BY MOUTH DAILY  . potassium chloride (KLOR-CON) 10 MEQ tablet Take one tablet by mouth with lasix.  Marland Kitchen tamsulosin (FLOMAX) 0.4 MG CAPS capsule TAKE 1 CAPSULE BY MOUTH EVERY DAY  . telmisartan (MICARDIS) 20 MG tablet Take 20 mg by mouth daily.  Marland Kitchen amLODipine (NORVASC) 5 MG tablet TAKE 1 TABLET BY MOUTH EVERY DAY (Patient not taking: Reported on 04/28/2019)   No facility-administered encounter medications on file as of 04/28/2019.    Review of Systems  Constitutional: Negative for appetite change and fever.       Weight is down.  Daughter reports he is eating.  Feel weight loss - decrease fluid.    HENT: Positive for congestion. Negative for sinus pressure and sore throat.   Respiratory: Negative for cough and chest tightness.        Breathing stable.    Cardiovascular: Negative for chest pain and palpitations.       Lower extremity swelling improved.    Gastrointestinal: Negative for abdominal pain, diarrhea, nausea and vomiting.  Genitourinary: Negative for difficulty urinating and dysuria.  Musculoskeletal: Negative for joint swelling and myalgias.  Skin: Negative for color change and rash.  Neurological: Negative for headaches.       No increased dizziness reported.    Psychiatric/Behavioral: Negative for  agitation and dysphoric mood.       Objective:    Physical Exam Constitutional:      General: He is not in acute distress.    Appearance: Normal appearance. He is well-developed.  HENT:     Head: Normocephalic and atraumatic.     Right Ear: External ear normal.     Left Ear: External ear normal.  Eyes:     General:        Right eye: No discharge.  Left eye: No discharge.     Conjunctiva/sclera: Conjunctivae normal.  Cardiovascular:     Rate and Rhythm: Normal rate and regular rhythm.  Pulmonary:     Effort: Pulmonary effort is normal. No respiratory distress.     Breath sounds: Normal breath sounds.  Abdominal:     General: Bowel sounds are normal.     Palpations: Abdomen is soft.     Tenderness: There is no abdominal tenderness.  Musculoskeletal:        General: No swelling or tenderness.     Cervical back: Neck supple. No tenderness.  Lymphadenopathy:     Cervical: No cervical adenopathy.  Skin:    Findings: No erythema or rash.  Neurological:     Mental Status: He is alert.  Psychiatric:        Mood and Affect: Mood normal.        Behavior: Behavior normal.     BP 110/60   Pulse 71   Temp 97.6 F (36.4 C) (Temporal)   Ht '5\' 5"'  (1.651 m)   Wt 138 lb 6.4 oz (62.8 kg)   SpO2 99%   BMI 23.03 kg/m  Wt Readings from Last 3 Encounters:  04/28/19 138 lb 6.4 oz (62.8 kg)  03/10/19 150 lb (68 kg)  01/04/19 150 lb (68 kg)     Lab Results  Component Value Date   WBC 5.4 04/28/2019   HGB 12.3 (L) 04/28/2019   HCT 37.2 (L) 04/28/2019   PLT 191.0 04/28/2019   GLUCOSE 107 (H) 04/28/2019   CHOL 116 04/28/2019   TRIG 82.0 04/28/2019   HDL 47.90 04/28/2019   LDLCALC 51 04/28/2019   ALT 6 04/28/2019   AST 18 04/28/2019   NA 136 04/28/2019   K 4.0 04/28/2019   CL 101 04/28/2019   CREATININE 0.87 04/28/2019   BUN 17 04/28/2019   CO2 29 04/28/2019   TSH 1.06 04/28/2019   INR 1.20 06/14/2015   HGBA1C 6.3 04/28/2019    DG Chest Portable 1  View  Result Date: 01/04/2019 CLINICAL DATA:  Shortness of breath EXAM: PORTABLE CHEST 1 VIEW COMPARISON:  July 28, 2018 FINDINGS: Increased interstitial prominence superimposed on chronic changes. No pleural effusion. Stable cardiomediastinal contours with mild cardiomegaly. IMPRESSION: Probable mild interstitial edema.  Similar cardiomegaly. Electronically Signed   By: Macy Mis M.D.   On: 01/04/2019 15:02       Assessment & Plan:   Problem List Items Addressed This Visit    Anemia    Has had GI w/up previously.  Recheck cbc and ferritin.        Atrial fibrillation ([place])    Off coumadin secondary to GI bleed.  Followed by cardiology.  On metoprolol.  Stable.        CAD (coronary artery disease)    Followed by cardiology.  Stable.        Chronic bronchitis ([place])    Has seen pulmonary.  Diagnosed with chronic bronchitis.  Breathing stable.  Continue breo.        Essential hypertension    On micardis and metoprolol.  Blood pressure as outlined.  Continue current medication regimen.  Follow pressures.  Follow metabolic panel.       Hypercholesteremia    On lipitor.  Follow lipid panel and liver function tests.        Hyperglycemia    Follow met b and a1c.       Lower extremity edema    Taking medication regularly.  Lower extremity swelling improved. On lasix.  Follow.        Progressive supranuclear palsy ([place])    Followed by neurology as outlined.        Thoracic aortic atherosclerosis ([facility])    On lipitor.            [name], MD

## 2019-05-02 ENCOUNTER — Encounter: Payer: Self-pay | Admitting: Internal Medicine

## 2019-05-02 NOTE — Assessment & Plan Note (Signed)
Followed by neurology as outlined.  

## 2019-05-02 NOTE — Assessment & Plan Note (Signed)
On lipitor.  Follow lipid panel and liver function tests.   

## 2019-05-02 NOTE — Assessment & Plan Note (Signed)
On micardis and metoprolol.  Blood pressure as outlined.  Continue current medication regimen.  Follow pressures.  Follow metabolic panel.

## 2019-05-02 NOTE — Assessment & Plan Note (Signed)
Has seen pulmonary.  Diagnosed with chronic bronchitis.  Breathing stable.  Continue breo.

## 2019-05-02 NOTE — Assessment & Plan Note (Signed)
Follow met b and a1c.

## 2019-05-02 NOTE — Assessment & Plan Note (Signed)
Taking medication regularly.  Lower extremity swelling improved. On lasix.  Follow.

## 2019-05-02 NOTE — Assessment & Plan Note (Signed)
Followed by cardiology. Stable.   

## 2019-05-02 NOTE — Assessment & Plan Note (Signed)
On lipitor

## 2019-05-02 NOTE — Assessment & Plan Note (Signed)
Off coumadin secondary to GI bleed.  Followed by cardiology.  On metoprolol.  Stable.

## 2019-05-02 NOTE — Assessment & Plan Note (Signed)
Has had GI w/up previously.  Recheck cbc and ferritin.

## 2019-05-05 ENCOUNTER — Ambulatory Visit: Payer: Medicare HMO | Attending: Internal Medicine

## 2019-05-05 DIAGNOSIS — Z23 Encounter for immunization: Secondary | ICD-10-CM

## 2019-05-05 NOTE — Progress Notes (Signed)
   Covid-19 Vaccination Clinic  Name:  RAMONE BLUMENSTOCK    MRN: [id number] DOB: 1925/08/05  05/05/2019  Mr. Cota was observed post Covid-19 immunization for 15 minutes without incident. He was provided with Vaccine Information Sheet and instruction to access the V-Safe system.   Mr. Osthoff was instructed to call 911 with any severe reactions post vaccine: Marland Kitchen Difficulty breathing  . Swelling of face and throat  . A fast heartbeat  . A bad rash all over body  . Dizziness and weakness   Immunizations Administered    Name Date Dose VIS Date Route   Pfizer COVID-19 Vaccine 05/05/2019 11:38 AM 0.3 mL 03/09/2018 Intramuscular   Manufacturer: [place]   Lot: [id number]   Uncertain: [id number]

## 2019-05-12 ENCOUNTER — Other Ambulatory Visit: Payer: Self-pay | Admitting: Internal Medicine

## 2019-05-19 ENCOUNTER — Other Ambulatory Visit: Payer: Self-pay | Admitting: Internal Medicine

## 2019-05-25 ENCOUNTER — Other Ambulatory Visit (INDEPENDENT_AMBULATORY_CARE_PROVIDER_SITE_OTHER): Payer: Self-pay | Admitting: Vascular Surgery

## 2019-05-25 ENCOUNTER — Other Ambulatory Visit: Payer: Self-pay

## 2019-05-25 ENCOUNTER — Inpatient Hospital Stay
Admission: EM | Admit: 2019-05-25 | Discharge: 2019-05-27 | DRG: 356 | Disposition: A | Discharge status: Home / Self Care | Payer: Medicare HMO | Attending: Hospitalist | Admitting: Hospitalist

## 2019-05-25 ENCOUNTER — Encounter
Admission: EM | Disposition: A | Discharge status: Home / Self Care | Payer: Self-pay | Source: Home / Self Care | Attending: Hospitalist

## 2019-05-25 ENCOUNTER — Encounter: Payer: Self-pay | Admitting: Emergency Medicine

## 2019-05-25 ENCOUNTER — Inpatient Hospital Stay: Payer: Medicare HMO

## 2019-05-25 DIAGNOSIS — K219 Gastro-esophageal reflux disease without esophagitis: Secondary | ICD-10-CM | POA: Diagnosis present

## 2019-05-25 DIAGNOSIS — I2510 Atherosclerotic heart disease of native coronary artery without angina pectoris: Secondary | ICD-10-CM | POA: Diagnosis present

## 2019-05-25 DIAGNOSIS — Z841 Family history of disorders of kidney and ureter: Secondary | ICD-10-CM | POA: Diagnosis not present

## 2019-05-25 DIAGNOSIS — Z87891 Personal history of nicotine dependence: Secondary | ICD-10-CM

## 2019-05-25 DIAGNOSIS — D62 Acute posthemorrhagic anemia: Secondary | ICD-10-CM | POA: Diagnosis present

## 2019-05-25 DIAGNOSIS — K648 Other hemorrhoids: Secondary | ICD-10-CM | POA: Diagnosis present

## 2019-05-25 DIAGNOSIS — I10 Essential (primary) hypertension: Secondary | ICD-10-CM | POA: Diagnosis present

## 2019-05-25 DIAGNOSIS — Z9049 Acquired absence of other specified parts of digestive tract: Secondary | ICD-10-CM | POA: Diagnosis not present

## 2019-05-25 DIAGNOSIS — E785 Hyperlipidemia, unspecified: Secondary | ICD-10-CM | POA: Diagnosis present

## 2019-05-25 DIAGNOSIS — E871 Hypo-osmolality and hyponatremia: Secondary | ICD-10-CM | POA: Diagnosis present

## 2019-05-25 DIAGNOSIS — M1990 Unspecified osteoarthritis, unspecified site: Secondary | ICD-10-CM | POA: Diagnosis present

## 2019-05-25 DIAGNOSIS — K625 Hemorrhage of anus and rectum: Secondary | ICD-10-CM | POA: Diagnosis not present

## 2019-05-25 DIAGNOSIS — Z7951 Long term (current) use of inhaled steroids: Secondary | ICD-10-CM | POA: Diagnosis not present

## 2019-05-25 DIAGNOSIS — Z20822 Contact with and (suspected) exposure to covid-19: Secondary | ICD-10-CM | POA: Diagnosis present

## 2019-05-25 DIAGNOSIS — Z881 Allergy status to other antibiotic agents status: Secondary | ICD-10-CM | POA: Diagnosis not present

## 2019-05-25 DIAGNOSIS — J42 Unspecified chronic bronchitis: Secondary | ICD-10-CM | POA: Diagnosis present

## 2019-05-25 DIAGNOSIS — Z880 Allergy status to penicillin: Secondary | ICD-10-CM

## 2019-05-25 DIAGNOSIS — D508 Other iron deficiency anemias: Secondary | ICD-10-CM

## 2019-05-25 DIAGNOSIS — Z79899 Other long term (current) drug therapy: Secondary | ICD-10-CM

## 2019-05-25 DIAGNOSIS — Z951 Presence of aortocoronary bypass graft: Secondary | ICD-10-CM | POA: Diagnosis not present

## 2019-05-25 DIAGNOSIS — N400 Enlarged prostate without lower urinary tract symptoms: Secondary | ICD-10-CM | POA: Diagnosis present

## 2019-05-25 DIAGNOSIS — Z808 Family history of malignant neoplasm of other organs or systems: Secondary | ICD-10-CM | POA: Diagnosis not present

## 2019-05-25 DIAGNOSIS — R578 Other shock: Secondary | ICD-10-CM | POA: Diagnosis present

## 2019-05-25 DIAGNOSIS — K922 Gastrointestinal hemorrhage, unspecified: Secondary | ICD-10-CM

## 2019-05-25 DIAGNOSIS — F329 Major depressive disorder, single episode, unspecified: Secondary | ICD-10-CM | POA: Diagnosis present

## 2019-05-25 DIAGNOSIS — D649 Anemia, unspecified: Secondary | ICD-10-CM | POA: Diagnosis present

## 2019-05-25 DIAGNOSIS — I4891 Unspecified atrial fibrillation: Secondary | ICD-10-CM | POA: Diagnosis present

## 2019-05-25 DIAGNOSIS — K5731 Diverticulosis of large intestine without perforation or abscess with bleeding: Principal | ICD-10-CM | POA: Diagnosis present

## 2019-05-25 DIAGNOSIS — D509 Iron deficiency anemia, unspecified: Secondary | ICD-10-CM | POA: Diagnosis present

## 2019-05-25 DIAGNOSIS — H409 Unspecified glaucoma: Secondary | ICD-10-CM | POA: Diagnosis present

## 2019-05-25 DIAGNOSIS — H9190 Unspecified hearing loss, unspecified ear: Secondary | ICD-10-CM | POA: Diagnosis present

## 2019-05-25 HISTORY — DX: Gastrointestinal hemorrhage, unspecified: K92.2

## 2019-05-25 HISTORY — PX: EMBOLIZATION: CATH118239

## 2019-05-25 LAB — HEMOGLOBIN AND HEMATOCRIT, BLOOD
HCT: 34.3 % — ABNORMAL LOW (ref 39.0–52.0)
Hemoglobin: 11.7 g/dL — ABNORMAL LOW (ref 13.0–17.0)

## 2019-05-25 LAB — CBC WITH DIFFERENTIAL/PLATELET
Abs Immature Granulocytes: 0.04 10*3/uL (ref 0.00–0.07)
Basophils Absolute: 0 10*3/uL (ref 0.0–0.1)
Basophils Relative: 1 %
Eosinophils Absolute: 0.1 10*3/uL (ref 0.0–0.5)
Eosinophils Relative: 1 %
HCT: 31.8 % — ABNORMAL LOW (ref 39.0–52.0)
Hemoglobin: 10.8 g/dL — ABNORMAL LOW (ref 13.0–17.0)
Immature Granulocytes: 1 %
Lymphocytes Relative: 22 %
Lymphs Abs: 1.7 10*3/uL (ref 0.7–4.0)
MCH: 31.6 pg (ref 26.0–34.0)
MCHC: 34 g/dL (ref 30.0–36.0)
MCV: 93 fL (ref 80.0–100.0)
Monocytes Absolute: 1 10*3/uL (ref 0.1–1.0)
Monocytes Relative: 13 %
Neutro Abs: 4.9 10*3/uL (ref 1.7–7.7)
Neutrophils Relative %: 62 %
Platelets: 195 10*3/uL (ref 150–400)
RBC: 3.42 MIL/uL — ABNORMAL LOW (ref 4.22–5.81)
RDW: 14.9 % (ref 11.5–15.5)
WBC: 7.8 10*3/uL (ref 4.0–10.5)
nRBC: 0 % (ref 0.0–0.2)

## 2019-05-25 LAB — COMPREHENSIVE METABOLIC PANEL
ALT: 5 U/L (ref 0–44)
AST: 22 U/L (ref 15–41)
Albumin: 4 g/dL (ref 3.5–5.0)
Alkaline Phosphatase: 73 U/L (ref 38–126)
Anion gap: 8 (ref 5–15)
BUN: 30 mg/dL — ABNORMAL HIGH (ref 8–23)
CO2: 23 mmol/L (ref 22–32)
Calcium: 8.7 mg/dL — ABNORMAL LOW (ref 8.9–10.3)
Chloride: 100 mmol/L (ref 98–111)
Creatinine, Ser: 1.13 mg/dL (ref 0.61–1.24)
GFR calc Af Amer: >60 mL/min (ref >60)
GFR calc non Af Amer: 57 mL/min — ABNORMAL LOW (ref >60)
Glucose, Bld: 129 mg/dL — ABNORMAL HIGH (ref 70–99)
Potassium: 4.8 mmol/L (ref 3.5–5.1)
Sodium: 131 mmol/L — ABNORMAL LOW (ref 135–145)
Total Bilirubin: 0.9 mg/dL (ref 0.3–1.2)
Total Protein: 6.4 g/dL — ABNORMAL LOW (ref 6.5–8.1)

## 2019-05-25 LAB — MRSA PCR SCREENING: MRSA by PCR: NEGATIVE

## 2019-05-25 LAB — SARS CORONAVIRUS 2 BY RT PCR (HOSPITAL ORDER, PERFORMED IN ~~LOC~~ HOSPITAL LAB): SARS Coronavirus 2: NEGATIVE

## 2019-05-25 LAB — GLUCOSE, CAPILLARY: Glucose-Capillary: 68 mg/dL — ABNORMAL LOW (ref 70–99)

## 2019-05-25 LAB — PROTIME-INR
INR: 1.2 (ref 0.8–1.2)
Prothrombin Time: 14.9 seconds (ref 11.4–15.2)

## 2019-05-25 LAB — PREPARE RBC (CROSSMATCH)

## 2019-05-25 LAB — APTT: aPTT: 34 seconds (ref 24–36)

## 2019-05-25 LAB — LACTIC ACID, PLASMA: Lactic Acid, Venous: 1.3 mmol/L (ref 0.5–1.9)

## 2019-05-25 SURGERY — EMBOLIZATION
Anesthesia: Moderate Sedation | Laterality: Right

## 2019-05-25 MED ORDER — TRANEXAMIC ACID-NACL 1000-0.7 MG/100ML-% IV SOLN
1000.0000 mg | INTRAVENOUS | Status: AC
  Administered 2019-05-25: 1000 mg via INTRAVENOUS
  Filled 2019-05-25: qty 100

## 2019-05-25 MED ORDER — FENTANYL CITRATE (PF) 100 MCG/2ML IJ SOLN
INTRAMUSCULAR | Status: DC | PRN
  Administered 2019-05-25: 25 ug via INTRAVENOUS

## 2019-05-25 MED ORDER — FAMOTIDINE 20 MG PO TABS: 40.0000 mg | ORAL_TABLET | Freq: Once | ORAL | Status: DC | PRN

## 2019-05-25 MED ORDER — HYDROMORPHONE HCL 1 MG/ML IJ SOLN: 1.0000 mg | Freq: Once | INTRAMUSCULAR | Status: DC | PRN

## 2019-05-25 MED ORDER — ONDANSETRON HCL 4 MG/2ML IJ SOLN: 4.0000 mg | Freq: Four times a day (QID) | INTRAMUSCULAR | Status: DC | PRN

## 2019-05-25 MED ORDER — SODIUM CHLORIDE 0.9% FLUSH
3.0000 mL | Freq: Two times a day (BID) | INTRAVENOUS | Status: DC
  Administered 2019-05-25 – 2019-05-26 (×3): 3 mL via INTRAVENOUS

## 2019-05-25 MED ORDER — SODIUM CHLORIDE 0.9 % IV BOLUS
1000.0000 mL | Freq: Once | INTRAVENOUS | Status: AC
  Administered 2019-05-25: 1000 mL via INTRAVENOUS

## 2019-05-25 MED ORDER — METHYLPREDNISOLONE SODIUM SUCC 125 MG IJ SOLR: 125.0000 mg | Freq: Once | INTRAMUSCULAR | Status: DC | PRN

## 2019-05-25 MED ORDER — FENTANYL CITRATE (PF) 100 MCG/2ML IJ SOLN
INTRAMUSCULAR | Status: AC
  Filled 2019-05-25: qty 2

## 2019-05-25 MED ORDER — MIDAZOLAM HCL 2 MG/2ML IJ SOLN
INTRAMUSCULAR | Status: DC | PRN
  Administered 2019-05-25: 1 mg via INTRAVENOUS

## 2019-05-25 MED ORDER — ACETAMINOPHEN 325 MG PO TABS
650.0000 mg | ORAL_TABLET | ORAL | Status: DC | PRN
  Administered 2019-05-25 – 2019-05-26 (×2): 650 mg via ORAL
  Filled 2019-05-25 (×2): qty 2

## 2019-05-25 MED ORDER — TECHNETIUM TC 99M-LABELED RED BLOOD CELLS IV KIT
20.0000 | PACK | Freq: Once | INTRAVENOUS | Status: AC | PRN
  Administered 2019-05-25: 24.337 via INTRAVENOUS

## 2019-05-25 MED ORDER — SODIUM CHLORIDE 0.9 % IV SOLN: INTRAVENOUS | Status: DC

## 2019-05-25 MED ORDER — CHLORHEXIDINE GLUCONATE CLOTH 2 % EX PADS
6.0000 | MEDICATED_PAD | Freq: Every day | CUTANEOUS | Status: DC
  Administered 2019-05-25: 6 via TOPICAL

## 2019-05-25 MED ORDER — CLINDAMYCIN PHOSPHATE 300 MG/50ML IV SOLN: 300.0000 mg | Freq: Once | INTRAVENOUS | Status: DC

## 2019-05-25 MED ORDER — LATANOPROST 0.005 % OP SOLN
1.0000 [drp] | Freq: Every day | OPHTHALMIC | Status: DC
  Administered 2019-05-25 – 2019-05-26 (×2): 1 [drp] via OPHTHALMIC
  Filled 2019-05-25 (×3): qty 2.5

## 2019-05-25 MED ORDER — PANTOPRAZOLE SODIUM 40 MG IV SOLR
40.0000 mg | INTRAVENOUS | Status: DC
  Administered 2019-05-25 – 2019-05-26 (×2): 40 mg via INTRAVENOUS
  Filled 2019-05-25 (×2): qty 40

## 2019-05-25 MED ORDER — ONDANSETRON HCL 4 MG PO TABS: 4.0000 mg | ORAL_TABLET | Freq: Four times a day (QID) | ORAL | Status: DC | PRN

## 2019-05-25 MED ORDER — DEXTROSE 50 % IV SOLN
25.0000 mL | Freq: Once | INTRAVENOUS | Status: AC
  Administered 2019-05-25: 25 mL via INTRAVENOUS

## 2019-05-25 MED ORDER — CLINDAMYCIN PHOSPHATE 300 MG/50ML IV SOLN
INTRAVENOUS | Status: AC
  Filled 2019-05-25: qty 50

## 2019-05-25 MED ORDER — SODIUM CHLORIDE 0.9 % IV SOLN
10.0000 mL/h | Freq: Once | INTRAVENOUS | Status: AC
  Administered 2019-05-25: 10 mL/h via INTRAVENOUS

## 2019-05-25 MED ORDER — UMECLIDINIUM BROMIDE 62.5 MCG/INH IN AEPB
1.0000 | INHALATION_SPRAY | Freq: Every day | RESPIRATORY_TRACT | Status: DC
  Administered 2019-05-25 – 2019-05-26 (×2): 1 via RESPIRATORY_TRACT
  Filled 2019-05-25 (×2): qty 7

## 2019-05-25 MED ORDER — MIDAZOLAM HCL 2 MG/ML PO SYRP: 8.0000 mg | ORAL_SOLUTION | Freq: Once | ORAL | Status: DC | PRN

## 2019-05-25 MED ORDER — IODIXANOL 320 MG/ML IV SOLN
INTRAVENOUS | Status: DC | PRN
  Administered 2019-05-25: 50 mL

## 2019-05-25 MED ORDER — SODIUM CHLORIDE 0.9% IV SOLUTION
Freq: Once | INTRAVENOUS | Status: DC
  Filled 2019-05-25: qty 250

## 2019-05-25 MED ORDER — SODIUM CHLORIDE 0.9% FLUSH: 3.0000 mL | INTRAVENOUS | Status: DC | PRN

## 2019-05-25 MED ORDER — MIDAZOLAM HCL 2 MG/2ML IJ SOLN
INTRAMUSCULAR | Status: AC
  Filled 2019-05-25: qty 2

## 2019-05-25 MED ORDER — SODIUM CHLORIDE 0.9 % IV SOLN: 250.0000 mL | INTRAVENOUS | Status: DC | PRN

## 2019-05-25 MED ORDER — DIPHENHYDRAMINE HCL 50 MG/ML IJ SOLN: 50.0000 mg | Freq: Once | INTRAMUSCULAR | Status: DC | PRN

## 2019-05-25 MED ORDER — FLUTICASONE FUROATE-VILANTEROL 100-25 MCG/INH IN AEPB
1.0000 | INHALATION_SPRAY | Freq: Every day | RESPIRATORY_TRACT | Status: DC
  Administered 2019-05-25 – 2019-05-26 (×2): 1 via RESPIRATORY_TRACT
  Filled 2019-05-25 (×2): qty 28

## 2019-05-25 MED ORDER — FLUTICASONE-UMECLIDIN-VILANT 100-62.5-25 MCG/INH IN AEPB: 1.0000 | INHALATION_SPRAY | Freq: Every day | RESPIRATORY_TRACT | Status: DC

## 2019-05-25 SURGICAL SUPPLY — 14 items
BLOCK BEAD 500-700 (Vascular Products) ×2 IMPLANT
CATH ANGIO 5F PIGTAIL 65CM (CATHETERS) ×2 IMPLANT
CATH MICROCATH PRGRT 2.8F 110 (CATHETERS) ×1 IMPLANT
CATH VS15FR (CATHETERS) ×2 IMPLANT
DEVICE STARCLOSE SE CLOSURE (Vascular Products) ×2 IMPLANT
GLIDEWIRE STIFF .35X180X3 HYDR (WIRE) ×2 IMPLANT
MICROCATH PROGREAT 2.8F 110 CM (CATHETERS) ×2
NEEDLE ENTRY 21GA 7CM ECHOTIP (NEEDLE) ×2 IMPLANT
PACK ANGIOGRAPHY (CUSTOM PROCEDURE TRAY) ×2 IMPLANT
SET INTRO CAPELLA COAXIAL (SET/KITS/TRAYS/PACK) ×2 IMPLANT
SHEATH BRITE TIP 5FRX11 (SHEATH) ×2 IMPLANT
SYR MEDRAD MARK 7 150ML (SYRINGE) ×2 IMPLANT
TUBING CONTRAST HIGH PRESS 72 (TUBING) ×2 IMPLANT
WIRE J 3MM .035X145CM (WIRE) ×2 IMPLANT

## 2019-05-25 NOTE — ED Notes (Signed)
Message sent to pharmacy to verify inhaler order

## 2019-05-25 NOTE — Consult Note (Signed)
[facility] GI Inpatient Consult Note   Kathline Magic, M.D.  Reason for Consult: Painless lower GI bleeding, anemia, hypotension   Attending Requesting Consult: Joycelyn Schmid, M.D.  History of Present Illness: Tyler Stout is a 86 y.o. male with a history of progressive supranuclear palsy, atrial fibrillation presenting for reported painless gastrointestinal bleeding early this morning.  Patient is hard of hearing but does have good memory for recent and remote medical events.  Wife is at the bedside.  Patient had significant bleeding this morning which was recurrent followed by 2 large red bloody bowel movements in the emergency room.  Since that time, he has undergone IV resuscitation and blood transfusion and his blood pressure is normalized.  He denies any upper GI symptoms such as nausea, vomiting, dysphagia, anorexia or weight loss.  He no longer takes anticoagulants although he was remotely on Coumadin presumably for history of atrial fibrillation. Patient's last documented colonoscopy was 05/11/2013 by Dr. Gaylyn Cheers revealing left-sided diverticulosis and internal hemorrhoids but was otherwise unremarkable.  Past Medical History:  Past Medical History:  Diagnosis Date  . Anemia    iron deficiency  . Arthritis   . Atrial fibrillation ([place])   . Bilateral renal cysts    worked up by Dr Bernardo Heater  . BPH (benign prostatic hypertrophy)   . CAD (coronary artery disease)    s/p bypass graft x 4 (1991)  . Diverticulosis   . GERD (gastroesophageal reflux disease)   . GI bleed   . Glaucoma   . Hypercholesterolemia   . Hypertension   . Pancreatitis    x2 (1994 and 1998), presumed to be gallstone pancreatitis.  s/p ERCP and sphincterotomy    Problem List: Patient Active Problem List   Diagnosis Date Noted  . GI bleeding 05/25/2019  . Acute lower GI bleeding 05/25/2019  . Lower extremity edema 01/02/2019  . Chronic bronchitis ([place]) 10/10/2018  . Scalp lesion  10/10/2018  . Pneumonia 07/28/2018  . PNA (pneumonia) 05/07/2018  . Cough 05/05/2018  . Hyperglycemia 09/13/2017  . Decreased pedal pulses 04/01/2017  . Dizziness 02/07/2017  . Progressive supranuclear palsy ([place]) 11/02/2016  . Thoracic aortic atherosclerosis ([place]) 08/12/2016  . Abnormal CXR 07/18/2016  . Urinary frequency 06/20/2015  . Lower GI bleed   . Demand ischemia ([place])   . Chronic atrial fibrillation ([place])   . Essential hypertension   . HLD (hyperlipidemia)   . BPH (benign prostatic hyperplasia)   . Tremor 06/10/2015  . Loss of weight 06/10/2015  . GI bleed 06/08/2015  . Health care maintenance 08/11/2014  . Light headedness 03/19/2014  . Back pain 03/13/2013  . Hypokalemia 02/20/2013  . CAD (coronary artery disease) 12/07/2011  . Hyponatremia 12/07/2011  . Atrial fibrillation ([place]) 12/07/2011  . Hypertension 11/20/2011  . Hypercholesteremia 11/20/2011  . Anemia 11/20/2011    Past Surgical History: Past Surgical History:  Procedure Laterality Date  . APPENDECTOMY    . CHOLECYSTECTOMY  1994  . CORONARY ARTERY BYPASS GRAFT     x 4,  1991  . INGUINAL HERNIA REPAIR  2009  . open heart surgery  1991    Allergies: Allergies  Allergen Reactions  . Ilosone [Erythromycin] Other (See Comments)    Reaction:  GI upset   . Penicillins Rash and Other (See Comments)    Has patient had a PCN reaction causing immediate rash, facial/tongue/throat swelling, SOB or lightheadedness with hypotension: No Has patient had a PCN reaction causing severe rash involving mucus membranes or  skin necrosis: No Has patient had a PCN reaction that required hospitalization No Has patient had a PCN reaction occurring within the last 10 years: No If all of the above answers are "NO", then may proceed with Cephalosporin use.    Home Medications: (Not in a hospital admission)  Home medication reconciliation was completed with the patient.   Scheduled Inpatient Medications:   . sodium  chloride   Intravenous Once  . Fluticasone-Umeclidin-Vilant  1 applicator Inhalation Daily  . latanoprost  1 drop Both Eyes QHS  . pantoprazole (PROTONIX) IV  40 mg Intravenous Q24H    Continuous Inpatient Infusions:   . sodium chloride      PRN Inpatient Medications:  ondansetron **OR** ondansetron (ZOFRAN) IV  Family History: family history includes Heart disease in an other family member; Kidney disease in his sister; Throat cancer in his father.   GI Family History: Negative  Social History:   reports that he quit smoking about 55 years ago. His smoking use included cigarettes. He has a 50.00 pack-year smoking history. He has never used smokeless tobacco. He reports that he does not drink alcohol or use drugs. The patient denies ETOH, tobacco, or drug use.    Review of Systems: Review of Systems - History obtained from the patient and Spouse General ROS: positive for  - sleep disturbance negative for - weight loss Psychological ROS: negative ENT ROS: negative Allergy and Immunology ROS: negative Hematological and Lymphatic ROS: negative Respiratory ROS: no cough, shortness of breath, or wheezing Cardiovascular ROS: no chest pain or dyspnea on exertion Genito-Urinary ROS: negative Musculoskeletal ROS: positive for - gait disturbance and muscular weakness negative for - joint pain Neurological ROS: no TIA or stroke symptoms Dermatological ROS: negative  Physical Examination: BP (!) 142/56   Pulse 68   Temp (!) 97.5 F (36.4 C) (Oral)   Resp 12   Ht 5\' 8"  (1.727 m)   Wt 61.2 kg   SpO2 100%   BMI 20.53 kg/m  Physical Exam Constitutional:      General: He is not in acute distress. HENT:     Head: Normocephalic.     Nose: Nose normal.     Mouth/Throat:     Mouth: Mucous membranes are dry.  Eyes:     Conjunctiva/sclera: Conjunctivae normal.     Pupils: Pupils are equal, round, and reactive to light.  Cardiovascular:     Rate and Rhythm: Normal rate.      Heart sounds: No gallop.   Pulmonary:     Effort: Pulmonary effort is normal. No respiratory distress.     Breath sounds: No wheezing.  Abdominal:     General: Abdomen is flat.     Palpations: Abdomen is soft. There is no mass.     Tenderness: There is no abdominal tenderness. There is no rebound.     Hernia: No hernia is present.  Musculoskeletal:        General: No swelling or tenderness.     Cervical back: Normal range of motion.  Skin:    General: Skin is warm and dry.     Capillary Refill: Capillary refill takes less than 2 seconds.  Neurological:     Mental Status: He is alert.     GCS: GCS eye subscore is 4. GCS verbal subscore is 5. GCS motor subscore is 4.     Cranial Nerves: Cranial nerves are intact.     Sensory: Sensation is intact.     Motor: Weakness and  tremor present.  Psychiatric:        Mood and Affect: Mood normal.     Data: Lab Results  Component Value Date   WBC 7.8 05/25/2019   HGB 10.8 (L) 05/25/2019   HCT 31.8 (L) 05/25/2019   MCV 93.0 05/25/2019   PLT 195 05/25/2019   Recent Labs  Lab 05/25/19 0421  HGB 10.8*   Lab Results  Component Value Date   NA 131 (L) 05/25/2019   K 4.8 05/25/2019   CL 100 05/25/2019   CO2 23 05/25/2019   BUN 30 (H) 05/25/2019   CREATININE 1.13 05/25/2019   Lab Results  Component Value Date   ALT 5 05/25/2019   AST 22 05/25/2019   ALKPHOS 73 05/25/2019   BILITOT 0.9 05/25/2019   Recent Labs  Lab 05/25/19 0421  APTT 34  INR 1.2   CBC Latest Ref Rng & Units 05/25/2019 04/28/2019 01/04/2019  WBC 4.0 - 10.5 K/uL 7.8 5.4 6.9  Hemoglobin 13.0 - 17.0 g/dL 10.8(L) 12.3(L) 10.5(L)  Hematocrit 39.0 - 52.0 % 31.8(L) 37.2(L) 31.2(L)  Platelets 150 - 400 K/uL 195 191.0 349    STUDIES: No results found. @IMAGES @  Assessment:  #1.  Painless lower GI bleeding-likely diverticular bleed given previous history on colonoscopy.  Also consider ischemic colitis. 2.  Anemia secondary to acute gastrointestinal bleed.  No  upper GI symptoms. 3.  History of atrial fibrillation currently rate controlled. 4.  Markedly advanced age.  COVID-19 status: Tested negative   Recommendations: #1.  I had a long talk with the patient and his wife regarding invasive procedures and x-rays.  Patient is willing to undergo colonoscopy and bleeding scans as necessary but is refusing any type of surgical intervention at this time. 2.  Will order tagged red blood cell scan to evaluate for potential source of GI blood loss. 3.  We will consent for colonoscopy.The patient understands the nature of the planned procedure, indications, risks, alternatives and potential complications including but not limited to bleeding, infection, perforation, damage to internal organs and possible oversedation/side effects from anesthesia. The patient agrees and gives consent to proceed.  Please refer to procedure notes for findings, recommendations and patient disposition/instructions.   Thank you for the consult. Please call with questions or concerns.  Olean Ree, "Lanny Hurst MD [facility] [place], [place] [address] [phone]  05/25/2019 10:28 AM

## 2019-05-25 NOTE — ED Notes (Addendum)
Pt transported to nuclear med for test- test estimated to take 2 hours- wife remains in room

## 2019-05-25 NOTE — ED Notes (Signed)
Pt back to room from [facility]- monitoring equipment replaced- pt denies any needs at this time

## 2019-05-25 NOTE — Consult Note (Signed)
[facility] Vascular Consult Note  MRN : [id number]  Tyler Stout is a 86 y.o. (11-21-1925) male who presents with chief complaint of  Chief Complaint  Patient presents with  . Rectal Bleeding   History of Present Illness: Tyler Stout is a 86 year old male with a history of iron deficiency anemia, A. fib not on anticoagulation, diverticulosis, GI bleed, CAD status post CABG, BPH, hypertension, hyperlipidemia who presents for evaluation of rectal bleeding.  History is gathered from patient his wife was at bedside.  Patient has had 3-4 episodes of rectal bleeding in the last 24 hours. Denies any nausea vomiting abdominal pain cramping.  Denies headache dizziness syncope or near syncope.  Denies any chest pain palpitations shortness of breath or orthopnea.  Denies any lower extremity edema.  GI bleed (05/25/19): Examination is positive for active bleeding which originates in the right colon and exhibits rapid antegrade progression throughout the remainder of the colon  Vascular surgery was consulted by Dr. Alice Reichert possible endovascular embolization.  Current Facility-Administered Medications  Medication Dose Route Frequency Provider Last Rate Last Admin  . clindamycin (CLEOCIN) 300 MG/50ML IVPB           . [MAR Hold] 0.9 %  sodium chloride infusion (Manually program via Guardrails IV Fluids)   Intravenous Once Agbata, Tochukwu, MD      . 0.9 %  sodium chloride infusion   Intravenous Continuous Agbata, Tochukwu, MD 100 mL/hr at 05/25/19 1048 New Bag at 05/25/19 1048  . 0.9 %  sodium chloride infusion   Intravenous Continuous Christopher Glasscock, Janalyn Harder, PA-C      . [START ON 05/26/2019] clindamycin (CLEOCIN) IVPB 300 mg  300 mg Intravenous Once Moriyah Byington A, PA-C      . diphenhydrAMINE (BENADRYL) injection 50 mg  50 mg Intravenous Once PRN Nimo Verastegui A, PA-C      . famotidine (PEPCID) tablet 40 mg  40 mg Oral Once PRN Carolynne Schuchard, Janalyn Harder, PA-C      .  [MAR Hold] fluticasone furoate-vilanterol (BREO ELLIPTA) 100-25 MCG/INH 1 puff  1 puff Inhalation Daily Agbata, Tochukwu, MD   1 puff at 05/25/19 1215   And  . [MAR Hold] umeclidinium bromide (INCRUSE ELLIPTA) 62.5 MCG/INH 1 puff  1 puff Inhalation Daily Agbata, Tochukwu, MD   1 puff at 05/25/19 1215  . HYDROmorphone (DILAUDID) injection 1 mg  1 mg Intravenous Once PRN Charmian Forbis, Janalyn Harder, PA-C      . [MAR Hold] latanoprost (XALATAN) 0.005 % ophthalmic solution 1 drop  1 drop Both Eyes QHS Agbata, Tochukwu, MD      . methylPREDNISolone sodium succinate (SOLU-MEDROL) 125 mg/2 mL injection 125 mg  125 mg Intravenous Once PRN Sabryna Lahm A, PA-C      . midazolam (VERSED) 2 MG/ML syrup 8 mg  8 mg Oral Once PRN Delecia Vastine A, PA-C      . ondansetron (ZOFRAN) injection 4 mg  4 mg Intravenous Q6H PRN Taj Nevins A, PA-C      . [MAR Hold] ondansetron (ZOFRAN) tablet 4 mg  4 mg Oral Q6H PRN Agbata, Tochukwu, MD      . Doug Sou Hold] pantoprazole (PROTONIX) injection 40 mg  40 mg Intravenous Q24H Agbata, Tochukwu, MD   40 mg at 05/25/19 1207   Past Medical History:  Diagnosis Date  . Anemia    iron deficiency  . Arthritis   . Atrial fibrillation ([place])   . Bilateral renal cysts    worked  up by Dr Bernardo Heater  . BPH (benign prostatic hypertrophy)   . CAD (coronary artery disease)    s/p bypass graft x 4 (1991)  . Diverticulosis   . GERD (gastroesophageal reflux disease)   . GI bleed   . Glaucoma   . Hypercholesterolemia   . Hypertension   . Pancreatitis    x2 (1994 and 1998), presumed to be gallstone pancreatitis.  s/p ERCP and sphincterotomy   Past Surgical History:  Procedure Laterality Date  . APPENDECTOMY    . CHOLECYSTECTOMY  1994  . CORONARY ARTERY BYPASS GRAFT     x 4,  1991  . INGUINAL HERNIA REPAIR  2009  . open heart surgery  1991   Social History Social History   Tobacco Use  . Smoking status: Former Smoker    Packs/day: 2.00    Years: 25.00    Pack  years: 50.00    Types: Cigarettes    Quit date: 01/14/1964    Years since quitting: 55.3  . Smokeless tobacco: Never Used  . Tobacco comment: smoked 2 packs/day, 1 pipe and 1 cigar once in a while.  Substance Use Topics  . Alcohol use: No    Alcohol/week: 0.0 standard drinks  . Drug use: No   Family History Family History  Problem Relation Age of Onset  . Throat cancer Father        oropharyngeal cancer  . Kidney disease Sister   . Heart disease Other   . Colon cancer Neg Hx   . Prostate cancer Neg Hx   Denies family history of peripheral artery disease or venous disease.  Denies any bleeding/clotting disorders.  Allergies  Allergen Reactions  . Ilosone [Erythromycin] Other (See Comments)    Reaction:  GI upset   . Penicillins Rash and Other (See Comments)    Has patient had a PCN reaction causing immediate rash, facial/tongue/throat swelling, SOB or lightheadedness with hypotension: No Has patient had a PCN reaction causing severe rash involving mucus membranes or skin necrosis: No Has patient had a PCN reaction that required hospitalization No Has patient had a PCN reaction occurring within the last 10 years: No If all of the above answers are "NO", then may proceed with Cephalosporin use.   REVIEW OF SYSTEMS (Negative unless checked)  Constitutional: [] Weight loss  [] Fever  [] Chills Cardiac: [] Chest pain   [] Chest pressure   [] Palpitations   [] Shortness of breath when laying flat   [] Shortness of breath at rest   [] Shortness of breath with exertion. Vascular:  [] Pain in legs with walking   [] Pain in legs at rest   [] Pain in legs when laying flat   [] Claudication   [] Pain in feet when walking  [] Pain in feet at rest  [] Pain in feet when laying flat   [] History of DVT   [] Phlebitis   [] Swelling in legs   [] Varicose veins   [] Non-healing ulcers Pulmonary:   [] Uses home oxygen   [] Productive cough   [] Hemoptysis   [] Wheeze  [] COPD   [] Asthma Neurologic:  [] Dizziness  [] Blackouts    [] Seizures   [] History of stroke   [] History of TIA  [] Aphasia   [] Temporary blindness   [] Dysphagia   [] Weakness or numbness in arms   [] Weakness or numbness in legs Musculoskeletal:  [] Arthritis   [] Joint swelling   [] Joint pain   [] Low back pain Hematologic:  [] Easy bruising  [] Easy bleeding   [] Hypercoagulable state   [] Anemic  [] Hepatitis Gastrointestinal:  [x] Blood in stool   []   Vomiting blood  [] Gastroesophageal reflux/heartburn   [] Difficulty swallowing. Genitourinary:  [] Chronic kidney disease   [] Difficult urination  [] Frequent urination  [] Burning with urination   [] Blood in urine Skin:  [] Rashes   [] Ulcers   [] Wounds Psychological:  [] History of anxiety   []  History of major depression.  Positive for bright red blood per rectum  Physical Examination  Vitals:   05/25/19 1344 05/25/19 1700 05/25/19 1730 05/25/19 1817  BP: (!) 157/67   (!) 172/82  Pulse: 86 92 93 94  Resp: 20 17 (!) 21 18  Temp: 97.7 F (36.5 C)   97.8 F (36.6 C)  TempSrc: Oral   Oral  SpO2: 99% 97% 99% 99%  Weight:    60.3 kg  Height:    5\' 5"  (1.651 m)   Body mass index is 22.13 kg/m. Gen:  WD/WN, NAD Head: [place]/AT, No temporalis wasting. Prominent temp pulse not noted. Ear/Nose/Throat: Hearing grossly intact, nares w/o erythema or drainage, oropharynx w/o Erythema/Exudate Eyes: Sclera non-icteric, conjunctiva clear Neck: Trachea midline.  No JVD.  Pulmonary:  Good air movement, respirations not labored, equal bilaterally.  Cardiac: Irregularly irregular Vascular:  Vessel Right Left  Radial Palpable Palpable  Ulnar Palpable Palpable  Brachial Palpable Palpable  Carotid Palpable, without bruit Palpable, without bruit  Aorta Not palpable N/A  Femoral Palpable Palpable  Popliteal Palpable Palpable  PT Palpable Palpable  DP Palpable Palpable   Gastrointestinal: soft, non-tender/non-distended. No guarding/reflex.  Musculoskeletal: M/S 5/5 throughout.  Extremities without ischemic changes.  No  deformity or atrophy. No edema. Neurologic: Sensation grossly intact in extremities.  Symmetrical.  Speech is fluent. Motor exam as listed above. Psychiatric: Judgment intact, Mood & affect appropriate for pt's clinical situation. Dermatologic: No rashes or ulcers noted.  No cellulitis or open wounds. Lymph : No Cervical, Axillary, or Inguinal lymphadenopathy.  CBC Lab Results  Component Value Date   WBC 7.8 05/25/2019   HGB 11.7 (L) 05/25/2019   HCT 34.3 (L) 05/25/2019   MCV 93.0 05/25/2019   PLT 195 05/25/2019   BMET    Component Value Date/Time   NA 131 (L) 05/25/2019 0421   NA 136 10/13/2011 0614   K 4.8 05/25/2019 0421   K 4.3 10/13/2011 0614   CL 100 05/25/2019 0421   CL 101 10/13/2011 0614   CO2 23 05/25/2019 0421   CO2 26 10/13/2011 0614   GLUCOSE 129 (H) 05/25/2019 0421   GLUCOSE 115 (H) 10/13/2011 0614   BUN 30 (H) 05/25/2019 0421   BUN 17 10/13/2011 0614   CREATININE 1.13 05/25/2019 0421   CREATININE 0.92 07/15/2016 1544   CALCIUM 8.7 (L) 05/25/2019 0421   CALCIUM 9.2 10/13/2011 0614   GFRNONAA 57 (L) 05/25/2019 0421   GFRNONAA >60 10/13/2011 0614   GFRAA >60 05/25/2019 0421   GFRAA >60 10/13/2011 [id number]   Estimated Creatinine Clearance: 36.3 mL/min (by C-G formula based on SCr of 1.13 mg/dL).  COAG Lab Results  Component Value Date   INR 1.2 05/25/2019   INR 1.20 06/14/2015   INR 1.13 06/13/2015   Radiology NM GI Blood Loss  Result Date: 05/25/2019 CLINICAL DATA:  Bright red blood per rectum. Two bloody bowel movements since arrival to ED requiring 2 units of blood. EXAM: NUCLEAR MEDICINE GASTROINTESTINAL BLEEDING SCAN TECHNIQUE: Sequential abdominal images were obtained following intravenous administration of Tc-47m labeled red blood cells. RADIOPHARMACEUTICALS:  24.3 mCi Tc-67m pertechnetate in-vitro labeled red cells. COMPARISON:  06/08/2015 FINDINGS: During the first hour of imaging there is no abnormal  radiotracer accumulation to suggest active  bleeding. Early in the second hour there is a faint focus of increased radiotracer activity seen within the right colon. Over the course of the second hour there is rapid antegrade progression of the radiopharmaceutical throughout the transverse and left colon. IMPRESSION: 1. Examination is positive for active bleeding which originates in the right colon and exhibits rapid antegrade progression throughout the remainder of the colon. Electronically Signed   By: Kerby Moors M.D.   On: 05/25/2019 16:36   Assessment/Plan The patient is a 86 year old male multiple medical issues who presented to the [facility] emergency department with multiple episodes of bright red blood per rectum  1.  GI bleed:  Patient presents to emergency department with multiple episodes of bloody bowel movements over last 24 hours.  Patient with positive bleeding scan with active bleeding noted to the right colon.  Already transfused 2 units packed red blood cells.  Recommend undergoing endovascular colonic embolization and attempt to stop the bleeding and create hemostasis.  Procedure, risk and benefit explained to the patient and his wife at the bedside.  All questions answered.  Patient is wife like to proceed.  2.  Atrial fibrillation: Controlled. Currently not on any oral anticoagulation.  3.  Hyperlipidemia: On aspirin and statin for medical management Encouraged good control as its slows the progression of atherosclerotic disease  Discussed with Dr. Francene Castle, PA-C  05/25/2019 6:42 PM  This note was created with Dragon medical transcription system.  Any error is purely unintentional

## 2019-05-25 NOTE — ED Notes (Signed)
Pt had two large BMs of red stool. Cleaned and new brief applied. MD notified. VSS at this time.

## 2019-05-25 NOTE — Op Note (Signed)
[facility] Percutaneous Study/Intervention Procedural Note     Surgeon(s): Nurse, children's: none  Pre-operative Diagnosis: 1. Lower GI bleed localized to the right hepatic flexure by nuclear scan   Post-operative diagnosis: Same  Procedure(s) Performed: 1. Ultrasound guidance for vascular access right common femoral artery 2. Catheter placement into the middle colic artery and the right colic artery 3. Aortogram and selective angiogram of the superior mesenteric artery 4. Microbead embolization of 1 cc with 500-700  polyvinyl alcohol beads in the middle colic artery and 0.5 cc into the right colic artery. 5. StarClose closure device right femoral artery  Anesthesia: Moderate conscious sedation for approximately 40 minutes using Versed and Fentanylintravenously.  Continuous ECG pulse oximetry and cardiopulmonary monitoring is performed throughout the entire procedure by the interventional radiology nurse total sedation time is as noted above  EBL: 20  Fluoro Time: 10.3 minutes  Contrast: 50 cc  Indications: Patient is a 86 y.o.male with brisk lower GI bleeding with resultant anemia. The patient has a nuclear medicine study showing bleeding from the hepatic flexure. The patient is brought in for angiography for further evaluation and potential treatment. Risks and benefits are discussed and informed consent is obtained  Procedure: The patient was identified and appropriate procedural time out was performed. The patient was then placed supine on the table and prepped and draped in the usual sterile fashion.Moderate conscious sedation was administered during a face to face encounter with the patient throughout the procedure with my supervision of the RN administering medicines and monitoring the patient's vital signs, pulse oximetry, telemetry and mental  status throughout from the start of the procedure until the patient was taken to the recovery room.    Ultrasound was used to evaluate the right common femoral artery. It was patent . A digital ultrasound image was acquired. A Seldinger needle was used to access the right common femoral artery under direct ultrasound guidance and a permanent image was performed. A 0.035 J wire was advanced without resistance and a 5Fr sheath was placed. Pigtail catheter was placed into the aorta and an AP aortogram was performed. This demonstrated the approximate origins of the celiac and SMA so we transitioned to the lateral projection.  A VS 1 catheter was used to selectively cannulate the SMA and the lateral projection we transition to AP.  Based on her continued bleeding and the nuclear medicine study I elected to treat this area with embolization. I initially advanced the Pro-Great microcatheter out the middle colic and imaged the middle colic.  Patient was breathing excessively which degraded the image somewhat.  I then instilled approximately 1 cc of medium-sized PVC beads in this location. Angiogram following this showed the main vessels to be open with significantly less brisk filling. I then pulled back and cannulated the right colic with the Pro-great microcatheter without difficulty. Selective imaging was performed which showed filling to the hepatic flexure.  1/2 cc of polyvinyl alcohol beads were deployed in the right colic. Again, completion angiogram showed the main vessels to be open with less brisk filling. I elected to terminate the procedure. The diagnostic catheter was removed. StarClose closure device was deployed in usual fashion with excellent hemostatic result. The patient was taken to the recovery room in stable condition having tolerated the procedure well.     Findings:As noted above imaging of the SMA and subsequently of the middle colic and the right colic was performed.  Given the  patient's heavy respirations the images  were somewhat degraded distally nevertheless the nuclear scan localized the bleeding source to the hepatic flexure and I elected to proceed with embolization as described above.  Follow-up imaging demonstrated patency of the large vessels but a diminished distal flow.  This is consistent with our goal and I elected to terminate the procedure.  Disposition: Patient was taken to the recovery room in stable condition having tolerated the procedure well.  Complications: None  Hortencia Pilar 05/25/2019 8:21 PM   This note was created with Dragon Medical transcription system. Any errors in dictation are purely unintentional.

## 2019-05-25 NOTE — Patient Outreach (Signed)
After completing the embolization was informed that patient's wife had gone home for the evening.  The number that was left as a contact is [phone].  I I called this number and it went to a voicemail.  I did leave a message saying the procedure of been successful and went as we had anticipated and as it had been described to her preprocedure.

## 2019-05-25 NOTE — ED Notes (Addendum)
Unit of emergent blood started; unit# [id number], 320 mL, started at 120 ml/hr.   0510am:  Rate change to 273 mL/hr

## 2019-05-25 NOTE — ED Notes (Signed)
Pt to room in wheelchair, stands and swivels with two person assistance to sit on stretcher; BP 88/45 upon arrival.  Pt states he is feeling short of breath, appears weak.

## 2019-05-25 NOTE — ED Triage Notes (Signed)
Pt to triage via w/c with no distress noted, mask in place; pt reports had some rectal bleeding this morning; denies any pain; st hx of same and was admitted to hospital for blood tx

## 2019-05-25 NOTE — ED Provider Notes (Signed)
[facility] Emergency Department Provider Note  ____________________________________________  Time seen: Approximately 5:06 AM  I have reviewed the triage vital signs and the nursing notes.   HISTORY  Chief Complaint Rectal Bleeding   HPI Tyler Stout is a 86 y.o. male with a history of iron deficiency anemia, A. fib not on anticoagulation, diverticulosis, GI bleed, CAD status post CABG, BPH, hypertension, hyperlipidemia who presents for evaluation of rectal bleeding.  History is gathered from patient his wife was at bedside.  Patient has had 3-4 episodes of rectal bleeding in the last 24 hours.  He is no longer any blood thinners.  Has had it in the past requiring transfusion.  No abdominal pain, no dizziness, no syncope, no chest pain.  He does feel mildly short of breath with ambulation since this started.  No melena, no coffee-ground emesis, no hematemesis, no nausea.   Past Medical History:  Diagnosis Date  . Anemia    iron deficiency  . Arthritis   . Atrial fibrillation ([place])   . Bilateral renal cysts    worked up by Dr Bernardo Heater  . BPH (benign prostatic hypertrophy)   . CAD (coronary artery disease)    s/p bypass graft x 4 (1991)  . Diverticulosis   . GERD (gastroesophageal reflux disease)   . Glaucoma   . Hypercholesterolemia   . Hypertension   . Pancreatitis    x2 (1994 and 1998), presumed to be gallstone pancreatitis.  s/p ERCP and sphincterotomy    Patient Active Problem List   Diagnosis Date Noted  . GI bleeding 05/25/2019  . Lower extremity edema 01/02/2019  . Chronic bronchitis ([place]) 10/10/2018  . Scalp lesion 10/10/2018  . Pneumonia 07/28/2018  . PNA (pneumonia) 05/07/2018  . Cough 05/05/2018  . Hyperglycemia 09/13/2017  . Decreased pedal pulses 04/01/2017  . Dizziness 02/07/2017  . Progressive supranuclear palsy ([place]) 11/02/2016  . Thoracic aortic atherosclerosis ([place]) 08/12/2016  . Abnormal CXR 07/18/2016  . Urinary  frequency 06/20/2015  . Lower GI bleed   . Demand ischemia ([place])   . Chronic atrial fibrillation ([place])   . Essential hypertension   . HLD (hyperlipidemia)   . BPH (benign prostatic hyperplasia)   . Tremor 06/10/2015  . Loss of weight 06/10/2015  . GI bleed 06/08/2015  . Health care maintenance 08/11/2014  . Light headedness 03/19/2014  . Back pain 03/13/2013  . Hypokalemia 02/20/2013  . CAD (coronary artery disease) 12/07/2011  . Hyponatremia 12/07/2011  . Atrial fibrillation ([place]) 12/07/2011  . Hypertension 11/20/2011  . Hypercholesteremia 11/20/2011  . Anemia 11/20/2011    Past Surgical History:  Procedure Laterality Date  . APPENDECTOMY    . CHOLECYSTECTOMY  1994  . CORONARY ARTERY BYPASS GRAFT     x 4,  1991  . INGUINAL HERNIA REPAIR  2009  . open heart surgery  1991    Prior to Admission medications   Medication Sig Start Date End Date Taking? Authorizing Provider  amLODipine (NORVASC) 5 MG tablet TAKE 1 TABLET BY MOUTH EVERY DAY Patient not taking: Reported on 04/28/2019 04/07/19   [name], MD  Ascorbic Acid (VITAMIN C PO) Take 1 tablet by mouth daily. Reported on 06/20/2015    [provider]  atorvastatin (LIPITOR) 20 MG tablet TAKE 1 TABLET (20 MG TOTAL) BY MOUTH DAILY. 01/03/19   [name], MD  azelastine (ASTELIN) 0.1 % nasal spray PLACE 1 SPRAY INTO BOTH NOSTRILS 2 (TWO) TIMES DAILY 12/17/18   [name], MD  BREO ELLIPTA 200-25 MCG/INH AEPB Inhale 1 puff into the lungs daily. 12/28/18   Flora Lipps, MD  carbidopa-levodopa (SINEMET IR) 25-100 MG tablet TAKE 2 TABLETS BY MOUTH 3 TIMES DAILY 04/10/17   [provider]  citalopram (CELEXA) 20 MG tablet TAKE 1 TABLET (20 MG TOTAL) BY MOUTH DAILY. 04/11/19   [name], MD  fluticasone (FLONASE) 50 MCG/ACT nasal spray SPRAY 2 SPRAYS INTO EACH NOSTRIL EVERY DAY 12/02/18   [name], MD  Fluticasone-Umeclidin-Vilant (TRELEGY ELLIPTA) 100-62.5-25 MCG/INH AEPB Inhale 1 applicator  into the lungs daily. Rinse mouth after use. 08/11/18   Laverle Hobby, MD  furosemide (LASIX) 20 MG tablet Take 20 mg by mouth daily.     [provider]  latanoprost (XALATAN) 0.005 % ophthalmic solution Place 1 drop into both eyes at bedtime.     [provider]  metoprolol tartrate (LOPRESSOR) 25 MG tablet TAKE 1 TABLET BY MOUTH TWICE A DAY 05/27/16   [provider]  Multiple Vitamin (MULTIVITAMIN WITH MINERALS) TABS tablet Take 1 tablet by mouth daily.    [provider]  pantoprazole (PROTONIX) 40 MG tablet TAKE 1 TABLET BY MOUTH EVERY DAY 05/12/19   [name], MD  potassium chloride (KLOR-CON) 10 MEQ tablet Take one tablet by mouth with lasix. 01/05/19   [name], MD  tamsulosin (FLOMAX) 0.4 MG CAPS capsule TAKE 1 CAPSULE BY MOUTH EVERY DAY 05/19/19   [name], MD  telmisartan (MICARDIS) 20 MG tablet Take 20 mg by mouth daily. 02/08/19   [provider]    Allergies Ilosone [erythromycin] and Penicillins  Family History  Problem Relation Age of Onset  . Throat cancer Father        oropharyngeal cancer  . Kidney disease Sister   . Heart disease Other   . Colon cancer Neg Hx   . Prostate cancer Neg Hx     Social History Social History   Tobacco Use  . Smoking status: Former Smoker    Packs/day: 2.00    Years: 25.00    Pack years: 50.00    Types: Cigarettes    Quit date: 01/14/1964    Years since quitting: 55.3  . Smokeless tobacco: Never Used  . Tobacco comment: smoked 2 packs/day, 1 pipe and 1 cigar once in a while.  Substance Use Topics  . Alcohol use: No    Alcohol/week: 0.0 standard drinks  . Drug use: No    Review of Systems  Constitutional: Negative for fever. Eyes: Negative for visual changes. ENT: Negative for sore throat. Neck: No neck pain  Cardiovascular: Negative for chest pain. Respiratory: Negative for shortness of breath. Gastrointestinal: Negative for abdominal pain, vomiting or  diarrhea. Genitourinary: Negative for dysuria. + rectal bleeding Musculoskeletal: Negative for back pain. Skin: Negative for rash. Neurological: Negative for headaches, weakness or numbness. Psych: No SI or HI  ____________________________________________   PHYSICAL EXAM:  VITAL SIGNS: ED Triage Vitals  Enc Vitals Group     BP 05/25/19 0410 (!) 74/57     Pulse Rate 05/25/19 0410 97     Resp 05/25/19 0410 18     Temp 05/25/19 0442 (!) 97 F (36.1 C)     Temp Source 05/25/19 0442 Axillary     SpO2 05/25/19 0410 97 %     Weight 05/25/19 0406 135 lb (61.2 kg)     Height 05/25/19 0406 5\' 8"  (1.727 m)     Head Circumference --      Peak Flow --  Pain Score 05/25/19 0406 0     Pain Loc --      Pain Edu? --      Excl. in [place]? --     Constitutional: Alert and oriented, no apparent distress. HEENT:      Head: Normocephalic and atraumatic.         Eyes: Conjunctivae are normal. Sclera is non-icteric.       Mouth/Throat: Mucous membranes are moist.       Neck: Supple with no signs of meningismus. Cardiovascular: Regular rate and rhythm. No murmurs, gallops, or rubs.  Respiratory: Normal respiratory effort. Lungs are clear to auscultation bilaterally. No wheezes, crackles, or rhonchi.  Gastrointestinal: Soft, non tender, and non distended with positive bowel sounds. No rebound or guarding. Genitourinary: Rectal exam showing blood Musculoskeletal:  No edema, cyanosis, or erythema of extremities. Neurologic: Normal speech and language. Face is symmetric. Moving all extremities. No gross focal neurologic deficits are appreciated. Skin: Skin is warm, dry and intact. No rash noted. Psychiatric: Mood and affect are normal. Speech and behavior are normal.  ____________________________________________   LABS (all labs ordered are listed, but only abnormal results are displayed)  Labs Reviewed  CBC WITH DIFFERENTIAL/PLATELET - Abnormal; Notable for the following components:       Result Value   RBC 3.42 (*)    Hemoglobin 10.8 (*)    HCT 31.8 (*)    All other components within normal limits  COMPREHENSIVE METABOLIC PANEL - Abnormal; Notable for the following components:   Sodium 131 (*)    Glucose, Bld 129 (*)    BUN 30 (*)    Calcium 8.7 (*)    Total Protein 6.4 (*)    GFR calc non Af Amer 57 (*)    All other components within normal limits  SARS CORONAVIRUS 2 BY RT PCR (HOSPITAL ORDER, [facility])  LACTIC ACID, PLASMA  PROTIME-INR  APTT  TYPE AND SCREEN  PREPARE RBC (CROSSMATCH)   ____________________________________________  EKG  ED ECG REPORT I, Rudene Re, the attending physician, personally viewed and interpreted this ECG.  Sinus rhythm, rate of 83, right bundle branch block, LAFB, normal QTC, left axis deviation, T wave inversions in anterior leads with no ST elevation. Unchanged from prior ____________________________________________  RADIOLOGY  none ____________________________________________   PROCEDURES  Procedure(s) performed:yes .1-3 Lead EKG Interpretation Performed by: Rudene Re, MD Authorized by: Rudene Re, MD     Interpretation: normal     ECG rate assessment: normal     Rhythm: sinus rhythm     Ectopy: none     Conduction: normal     Critical Care performed: yes  CRITICAL CARE Performed by: Rudene Re  ?  Total critical care time: 45 min  Critical care time was exclusive of separately billable procedures and treating other patients.  Critical care was necessary to treat or prevent imminent or life-threatening deterioration.  Critical care was time spent personally by me on the following activities: development of treatment plan with patient and/or surrogate as well as nursing, discussions with consultants, evaluation of patient's response to treatment, examination of patient, obtaining history from patient or surrogate, ordering and performing treatments  and interventions, ordering and review of laboratory studies, ordering and review of radiographic studies, pulse oximetry and re-evaluation of patient's condition.  ____________________________________________   INITIAL IMPRESSION / ASSESSMENT AND PLAN / ED COURSE  86 y.o. male with a history of iron deficiency anemia, A. fib not on anticoagulation, diverticulosis, GI  bleed, CAD status post CABG, BPH, hypertension, hyperlipidemia who presents for evaluation of rectal bleeding 3 episodes x 24 hours. Patient arrives to the ED hypotensive with HR 75.  He is not on blood thinners but does take 3 antihypertensive medications which he took yesterday including Lopressor, telmisartan, and amlodipine.  Rectal exam with gross blood.  Review of epic shows history of diverticular bleed in the past.  History gathered from patient and his wife was at bedside.  2 units of emergency release blood initiated.  IV fluid bolus initiated due to significant hypotension.  Labs showing a two-point drop on patient's hemoglobin since last presentation to the hospital 2 months ago.  Current hemoglobin at 10.8. Will give IV TXA. No prior history of CVA. Patient will be admitted to Hospitalist service.   _________________________ 6:33 AM on 05/25/2019 -----------------------------------------  BP improving with pRBC and IVF. No further episodes of BRBPR. Patient admitted to Hospitalist service.      _____________________________________________ Please note:  Patient was evaluated in Emergency Department today for the symptoms described in the history of present illness. Patient was evaluated in the context of the global COVID-19 pandemic, which necessitated consideration that the patient might be at risk for infection with the SARS-CoV-2 virus that causes COVID-19. Institutional protocols and algorithms that pertain to the evaluation of patients at risk for COVID-19 are in a state of rapid change based on information released  by regulatory bodies including the CDC and federal and state organizations. These policies and algorithms were followed during the patient's care in the ED.  Some ED evaluations and interventions may be delayed as a result of limited staffing during the pandemic.   [place] Controlled Substance Database was reviewed by me. ____________________________________________   FINAL CLINICAL IMPRESSION(S) / ED DIAGNOSES   Final diagnoses:  Lower GI bleed  Hemorrhagic shock ([place])      NEW MEDICATIONS STARTED DURING THIS VISIT:  ED Discharge Orders    None       Note:  This document was prepared using Dragon voice recognition software and may include unintentional dictation errors.    Alfred Levins, [place], MD 05/25/19 [phone]

## 2019-05-25 NOTE — H&P (Addendum)
History and Physical    Tyler Stout [id number] DOB: 29-Jan-1925 DOA: 05/25/2019  PCP: [name], MD Consultants:  GI Patient coming from:  Home - lives with wife.   Chief Complaint: Red blood per rectum  HPI: Tyler Stout is a 86 y.o. male with medical history significant of iron deficiency anemia, diverticulosis, A. fib not on anticoagulation, GI bleed, CAD status post CABG, hypertension, BPH, hyperlipidemia, hard of hearing presented to the emergency department May 12 chief complaint of bright red blood per rectum.  Information is obtained from the patient and his wife who is at the bedside.  He reports 3-4 episodes of bright red blood per rectum in the 24 hours prior to presentation.  He does have A. fib and is no longer on blood thinners.  Nuys any NSAID use.  Denies any nausea vomiting abdominal pain cramping.  Denies headache dizziness syncope or near syncope.  Denies any chest pain palpitations shortness of breath or orthopnea.  Denies any lower extremity edema.   ED Course: In the emergency department he is quite hypotensive with a heart rate of 75.  FOBT positive work-up reveals hemoglobin of 10 which is a two-point drop since hospitalization 2 months ago.  He was provided with IV fluids and packed red blood cells x2 units.  At the time of admission he is hemodynamically stable and not hypoxic.  While in the emergency department had 2 large BMs of red stool.   Review of Systems: As per HPI; otherwise review of systems reviewed and negative.   Ambulatory Status:  Past Medical History:  Diagnosis Date  . Anemia    iron deficiency  . Arthritis   . Atrial fibrillation ([place])   . Bilateral renal cysts    worked up by Dr Bernardo Heater  . BPH (benign prostatic hypertrophy)   . CAD (coronary artery disease)    s/p bypass graft x 4 (1991)  . Diverticulosis   . GERD (gastroesophageal reflux disease)   . GI bleed   . Glaucoma   . Hypercholesterolemia   . Hypertension   .  Pancreatitis    x2 (1994 and 1998), presumed to be gallstone pancreatitis.  s/p ERCP and sphincterotomy    Past Surgical History:  Procedure Laterality Date  . APPENDECTOMY    . CHOLECYSTECTOMY  1994  . CORONARY ARTERY BYPASS GRAFT     x 4,  1991  . INGUINAL HERNIA REPAIR  2009  . open heart surgery  1991    Social History   Socioeconomic History  . Marital status: Married    Spouse name: Not on file  . Number of children: Not on file  . Years of education: Not on file  . Highest education level: Not on file  Occupational History  . Not on file  Tobacco Use  . Smoking status: Former Smoker    Packs/day: 2.00    Years: 25.00    Pack years: 50.00    Types: Cigarettes    Quit date: 01/14/1964    Years since quitting: 55.3  . Smokeless tobacco: Never Used  . Tobacco comment: smoked 2 packs/day, 1 pipe and 1 cigar once in a while.  Substance and Sexual Activity  . Alcohol use: No    Alcohol/week: 0.0 standard drinks  . Drug use: No  . Sexual activity: Not on file  Other Topics Concern  . Not on file  Social History Narrative  . Not on file   Social Determinants of Health  Financial Resource Strain:   . Difficulty of Paying Living Expenses:   Food Insecurity:   . Worried About Charity fundraiser in the Last Year:   . Arboriculturist in the Last Year:   Transportation Needs:   . Film/video editor (Medical):   Marland Kitchen Lack of Transportation (Non-Medical):   Physical Activity:   . Days of Exercise per Week:   . Minutes of Exercise per Session:   Stress:   . Feeling of Stress :   Social Connections:   . Frequency of Communication with Friends and Family:   . Frequency of Social Gatherings with Friends and Family:   . Attends Religious Services:   . Active Member of Clubs or Organizations:   . Attends Archivist Meetings:   Marland Kitchen Marital Status:   Intimate Partner Violence:   . Fear of Current or Ex-Partner:   . Emotionally Abused:   Marland Kitchen Physically Abused:    . Sexually Abused:     Allergies  Allergen Reactions  . Ilosone [Erythromycin] Other (See Comments)    Reaction:  GI upset   . Penicillins Rash and Other (See Comments)    Has patient had a PCN reaction causing immediate rash, facial/tongue/throat swelling, SOB or lightheadedness with hypotension: No Has patient had a PCN reaction causing severe rash involving mucus membranes or skin necrosis: No Has patient had a PCN reaction that required hospitalization No Has patient had a PCN reaction occurring within the last 10 years: No If all of the above answers are "NO", then may proceed with Cephalosporin use.    Family History  Problem Relation Age of Onset  . Throat cancer Father        oropharyngeal cancer  . Kidney disease Sister   . Heart disease Other   . Colon cancer Neg Hx   . Prostate cancer Neg Hx     Prior to Admission medications   Medication Sig Start Date End Date Taking? Authorizing Provider  amLODipine (NORVASC) 5 MG tablet TAKE 1 TABLET BY MOUTH EVERY DAY Patient not taking: Reported on 04/28/2019 04/07/19   [name], MD  Ascorbic Acid (VITAMIN C PO) Take 1 tablet by mouth daily. Reported on 06/20/2015    [provider]  atorvastatin (LIPITOR) 20 MG tablet TAKE 1 TABLET (20 MG TOTAL) BY MOUTH DAILY. 01/03/19   [name], MD  azelastine (ASTELIN) 0.1 % nasal spray PLACE 1 SPRAY INTO BOTH NOSTRILS 2 (TWO) TIMES DAILY 12/17/18   [name], MD  BREO ELLIPTA 200-25 MCG/INH AEPB Inhale 1 puff into the lungs daily. 12/28/18   Flora Lipps, MD  carbidopa-levodopa (SINEMET IR) 25-100 MG tablet TAKE 2 TABLETS BY MOUTH 3 TIMES DAILY 04/10/17   [provider]  citalopram (CELEXA) 20 MG tablet TAKE 1 TABLET (20 MG TOTAL) BY MOUTH DAILY. 04/11/19   [name], MD  fluticasone (FLONASE) 50 MCG/ACT nasal spray SPRAY 2 SPRAYS INTO EACH NOSTRIL EVERY DAY 12/02/18   [name], MD  Fluticasone-Umeclidin-Vilant (TRELEGY ELLIPTA) 100-62.5-25  MCG/INH AEPB Inhale 1 applicator into the lungs daily. Rinse mouth after use. 08/11/18   Laverle Hobby, MD  furosemide (LASIX) 20 MG tablet Take 20 mg by mouth daily.     [provider]  latanoprost (XALATAN) 0.005 % ophthalmic solution Place 1 drop into both eyes at bedtime.     [provider]  metoprolol tartrate (LOPRESSOR) 25 MG tablet TAKE 1 TABLET BY MOUTH TWICE A DAY 05/27/16   [provider]  Multiple Vitamin (MULTIVITAMIN WITH MINERALS) TABS tablet Take 1 tablet by mouth daily.    [provider]  pantoprazole (PROTONIX) 40 MG tablet TAKE 1 TABLET BY MOUTH EVERY DAY 05/12/19   [name], MD  potassium chloride (KLOR-CON) 10 MEQ tablet Take one tablet by mouth with lasix. 01/05/19   [name], MD  tamsulosin (FLOMAX) 0.4 MG CAPS capsule TAKE 1 CAPSULE BY MOUTH EVERY DAY 05/19/19   [name], MD  telmisartan (MICARDIS) 20 MG tablet Take 20 mg by mouth daily. 02/08/19   [provider]    Physical Exam: Vitals:   05/25/19 0711 05/25/19 0730 05/25/19 0800 05/25/19 0830  BP: (!) 151/76 (!) 170/82 132/86 111/76  Pulse: 85 88 87 89  Resp: 18 (!) 22 20 16   Temp: (!) 97.5 F (36.4 C)     TempSrc: Oral     SpO2: 100% 100% 98% 97%  Weight:      Height:         . General:  Appears calm and comfortable and is NAD he is thin frail somewhat pale appearing . Eyes:  PERRL, EOMI, normal lids, iris . ENT:  grossly normal hearing, lips & tongue, mmm; appropriate dentition . Neck:  no LAD, masses or thyromegaly; no carotid bruits . Cardiovascular:  RRR, no m/r/g. No LE edema.  Marland Kitchen Respiratory:   CTA bilaterally with no wheezes/rales/rhonchi.  Normal respiratory effort. . Abdomen:  soft, NT, ND, NABS . Back:   normal alignment, no CVAT . Skin:  no rash or induration seen on limited exam . Musculoskeletal:  grossly normal tone BUE/BLE, good ROM, no bony abnormality . Lower extremity:  No LE edema.  Limited foot exam with no  ulcerations.  2+ distal pulses. Marland Kitchen Psychiatric:  grossly normal mood and affect, speech fluent and appropriate, AOx3 . Neurologic:  CN 2-12 grossly intact, moves all extremities in coordinated fashion, sensation intact he is quite hard of hearing    Radiological Exams on Admission: No results found.  EKG: AV disassociation 83 bpm.  Unchanged from previous Labs on Admission: I have personally reviewed the available labs and imaging studies at the time of the admission.  Pertinent labs: Work-up reveals a hemoglobin 10.8 which is a drop from 12.3 approximately 3 weeks ago.  MCV 93.  Sodium level 131 serum glucose 129  Assessment/Plan Principal Problem:   Acute lower GI bleeding Active Problems:   Anemia   CAD (coronary artery disease)   Atrial fibrillation ([facility])   Essential hypertension   Hyponatremia   HLD (hyperlipidemia)   Chronic bronchitis ([place])    #1.  Bright red blood per rectum/acute lower GI.  Patient with a history of same 2017. ?? Diverticular bleed. He is not on any blood thinners denies NSAID use.  He had 3 or 4 episodes prior to presentation and 2 episodes while in the emergency department.  Hemoglobin 10.8 on admission.  FOBT+ -transfuse 2 units PRBC -PPI -gi consult -scd   #2.  Acute blood loss anemia/anemia.  History of iron deficiency anemia.  See #1.  He had 2 more episodes while in the emergency department of bloody stool.  2 units of packed red blood cells have been ordered. -Serial CBC -Transfuse as indicated -NPO until evaluated by GI -GI consult  #3.  Atrial fibrillation.  Not on anticoagulation due to history of GI bleed.  Home meds include metoprolol which will be placed on hold until acute blood loss stabilizes   4.  Hypertension.  Patient was quite hypotensive upon presentation.  See #1.  Home medications include amlodipine, Lasix, metoprolol, telmisartan.  Pressure improved with transfusion but still low end of normal. -Hold home meds for  now -Monitor closely -Resume home meds as indicated  #5.  Hyponatremia.  Mild. Likely related to above. -monitor  Note: This patient has been tested and is negative for the novel coronavirus COVID-19.  DVT prophylaxis: SCD Code Status:  Full - confirmed with patient/family Family Communication: wife at bedside Disposition Plan:  Home once clinically improved Consults called: GI Admission status: inpatient   Radene Gunning NP [facility]   How to contact the [facility] Attending or Consulting provider [place] or covering provider during after hours [place], for this patient?  1. Check the care team in [facility] and look for a) attending/consulting [facility] provider listed and b) the [facility] team listed 2. Log into [email or web address] and use [place]'s universal password to access. If you do not have the password, please contact the hospital operator. 3. Locate the [facility] provider you are looking for under [facility] and page to a number that you can be directly reached. 4. If you still have difficulty reaching the provider, please page the [facility] (Director on Call) for the Hospitalists listed on amion for assistance.   05/25/2019, 9:16 AM

## 2019-05-26 ENCOUNTER — Encounter
Admission: EM | Disposition: A | Discharge status: Home / Self Care | Payer: Self-pay | Source: Home / Self Care | Attending: Hospitalist

## 2019-05-26 ENCOUNTER — Encounter: Payer: Self-pay | Admitting: Cardiology

## 2019-05-26 DIAGNOSIS — K922 Gastrointestinal hemorrhage, unspecified: Secondary | ICD-10-CM

## 2019-05-26 LAB — CBC
HCT: 42.3 % (ref 39.0–52.0)
Hemoglobin: 14.6 g/dL (ref 13.0–17.0)
MCH: 31.5 pg (ref 26.0–34.0)
MCHC: 34.5 g/dL (ref 30.0–36.0)
MCV: 91.2 fL (ref 80.0–100.0)
Platelets: 151 10*3/uL (ref 150–400)
RBC: 4.64 MIL/uL (ref 4.22–5.81)
RDW: 14.6 % (ref 11.5–15.5)
WBC: 8.7 10*3/uL (ref 4.0–10.5)
nRBC: 0 % (ref 0.0–0.2)

## 2019-05-26 LAB — BASIC METABOLIC PANEL
Anion gap: 7 (ref 5–15)
BUN: 20 mg/dL (ref 8–23)
CO2: 24 mmol/L (ref 22–32)
Calcium: 8.7 mg/dL — ABNORMAL LOW (ref 8.9–10.3)
Chloride: 106 mmol/L (ref 98–111)
Creatinine, Ser: 0.75 mg/dL (ref 0.61–1.24)
GFR calc Af Amer: >60 mL/min (ref >60)
GFR calc non Af Amer: >60 mL/min (ref >60)
Glucose, Bld: 77 mg/dL (ref 70–99)
Potassium: 4.2 mmol/L (ref 3.5–5.1)
Sodium: 137 mmol/L (ref 135–145)

## 2019-05-26 LAB — GLUCOSE, CAPILLARY: Glucose-Capillary: 151 mg/dL — ABNORMAL HIGH (ref 70–99)

## 2019-05-26 SURGERY — EMBOLIZATION
Anesthesia: Moderate Sedation

## 2019-05-26 MED ORDER — PANTOPRAZOLE SODIUM 40 MG PO TBEC
40.0000 mg | DELAYED_RELEASE_TABLET | Freq: Every day | ORAL | Status: DC
  Administered 2019-05-27: 40 mg via ORAL
  Filled 2019-05-26: qty 1

## 2019-05-26 MED ORDER — CARBIDOPA-LEVODOPA ER 50-200 MG PO TBCR
1.0000 | EXTENDED_RELEASE_TABLET | Freq: Every day | ORAL | Status: DC
  Administered 2019-05-26: 1 via ORAL
  Filled 2019-05-26 (×2): qty 1

## 2019-05-26 MED ORDER — CITALOPRAM HYDROBROMIDE 20 MG PO TABS
20.0000 mg | ORAL_TABLET | Freq: Every day | ORAL | Status: DC
  Administered 2019-05-27: 20 mg via ORAL
  Filled 2019-05-26: qty 1

## 2019-05-26 MED ORDER — IRBESARTAN 150 MG PO TABS
75.0000 mg | ORAL_TABLET | Freq: Every day | ORAL | Status: DC
  Administered 2019-05-26 – 2019-05-27 (×2): 75 mg via ORAL
  Filled 2019-05-26 (×2): qty 1

## 2019-05-26 MED ORDER — METOPROLOL TARTRATE 25 MG PO TABS
25.0000 mg | ORAL_TABLET | Freq: Two times a day (BID) | ORAL | Status: DC
  Administered 2019-05-26 – 2019-05-27 (×2): 25 mg via ORAL
  Filled 2019-05-26 (×2): qty 1

## 2019-05-26 MED ORDER — TAMSULOSIN HCL 0.4 MG PO CAPS
0.4000 mg | ORAL_CAPSULE | Freq: Every day | ORAL | Status: DC
  Administered 2019-05-27: 0.4 mg via ORAL
  Filled 2019-05-26: qty 1

## 2019-05-26 NOTE — Progress Notes (Signed)
PROGRESS NOTE    Tyler Stout  [id number] DOB: [birth year] DOA: 05/25/2019 PCP: [name], MD    Assessment & Plan:   Principal Problem:   Acute lower GI bleeding Active Problems:   Anemia   CAD (coronary artery disease)   Hyponatremia   Atrial fibrillation ([place])   Essential hypertension   HLD (hyperlipidemia)   Chronic bronchitis ([place])    Tyler Stout is a 86 y.o. Caucasian male with medical history significant of iron deficiency anemia, diverticulosis, A. fib not on anticoagulation, GI bleed, CAD status post CABG, hypertension, BPH, hyperlipidemia, hard of hearing presented to the emergency department May 12 chief complaint of bright red blood per rectum.     1.  Acute right colonic diverticular bleed s/p vascular intervention with microbead embolization of branches of the right colic artery.  --He had 3 or 4 episodes prior to presentation and 2 episodes while in the emergency department.  Hemoglobin 10.8 on admission.  FOBT+ -transfused 2 units PRBC PLAN: --d/c IV PPI --Advance to Full liquid --monitor for Hgb for 1 more day  #3.  Atrial fibrillation.   Not on anticoagulation due to history of GI bleed.   --resume home metop  4.  Hypertension.   Patient was quite hypotensive upon presentation. Home BP meds held initially. PLAN: --Resume home metop now and ARB tomorrow  #5.  Hyponatremia, mild, resolved --improved with MIVF --d/c MIVF and encourage oral fluid intake  BPH --continue home flomax  GERD --continue home oral PPI  Depression --continue home Celexa   DVT prophylaxis: SCD/Compression stockings Code Status: Full code  Family Communication: updated wife and daughter at the bedside today Status is: inpatient Dispo:   The patient is from: home Anticipated d/c is to: home Anticipated d/c date is: tomorrow Patient currently is not medically stable to d/c due to: Need to monitor for Hgb for 1 more day, since still having maroon  stools.   Subjective and Interval History:  Pt had no complaints, and wanted to go home already.  No fever, dyspnea, chest pain, abdominal pain, N/V.  Still having some maroon-colored stools this morning.  Transferred out of stepdown today.   Objective: Vitals:   05/26/19 1200 05/26/19 1300 05/26/19 1400 05/26/19 1531  BP: (!) 147/77 (!) 139/97 (!) 145/74 (!) 164/78  Pulse: 85 98 88 86  Resp: (!) 21 18 16 16   Temp: 98.7 F (37.1 C)   97.7 F (36.5 C)  TempSrc: Oral   Oral  SpO2: 99% 100% 97% 100%  Weight:      Height:        Intake/Output Summary (Last 24 hours) at 05/26/2019 1820 Last data filed at 05/26/2019 1600 Gross per 24 hour  Intake 1658.51 ml  Output 851 ml  Net 807.51 ml   Filed Weights   05/25/19 0406 05/25/19 1817 05/25/19 2100  Weight: 61.2 kg 60.3 kg 61.5 kg    Examination:   Constitutional: NAD, AAOx3, frail  HEENT: conjunctivae and lids normal, EOMI CV: RRR no M,R,G. Distal pulses +2.  No cyanosis.   RESP: CTA B/L, normal respiratory effort  GI: +BS, NTND Extremities: No effusions, edema, or tenderness in BLE SKIN: warm, dry and intact Neuro: II - XII grossly intact.  Sensation intact Psych: Normal mood and affect.     Data Reviewed: I have personally reviewed following labs and imaging studies  CBC: Recent Labs  Lab 05/25/19 0421 05/25/19 1210 05/26/19 0508  WBC 7.8  --  8.7  NEUTROABS  4.9  --   --   HGB 10.8* 11.7* 14.6  HCT 31.8* 34.3* 42.3  MCV 93.0  --  91.2  PLT 195  --  [id number]   Basic Metabolic Panel: Recent Labs  Lab 05/25/19 0421 05/26/19 0508  NA 131* 137  K 4.8 4.2  CL 100 106  CO2 23 24  GLUCOSE 129* 77  BUN 30* 20  CREATININE 1.13 0.75  CALCIUM 8.7* 8.7*   GFR: Estimated Creatinine Clearance: 52.3 mL/min (by C-G formula based on SCr of 0.75 mg/dL). Liver Function Tests: Recent Labs  Lab 05/25/19 0421  AST 22  ALT 5  ALKPHOS 73  BILITOT 0.9  PROT 6.4*  ALBUMIN 4.0   No results for input(s): LIPASE,  AMYLASE in the last 168 hours. No results for input(s): AMMONIA in the last 168 hours. Coagulation Profile: Recent Labs  Lab 05/25/19 0421  INR 1.2   Cardiac Enzymes: No results for input(s): CKTOTAL, CKMB, CKMBINDEX, TROPONINI in the last 168 hours. BNP (last 3 results) No results for input(s): PROBNP in the last 8760 hours. HbA1C: No results for input(s): HGBA1C in the last 72 hours. CBG: Recent Labs  Lab 05/25/19 2056 05/25/19 2145  GLUCAP 68* 151*   Lipid Profile: No results for input(s): CHOL, HDL, LDLCALC, TRIG, CHOLHDL, LDLDIRECT in the last 72 hours. Thyroid Function Tests: No results for input(s): TSH, T4TOTAL, FREET4, T3FREE, THYROIDAB in the last 72 hours. Anemia Panel: No results for input(s): VITAMINB12, FOLATE, FERRITIN, TIBC, IRON, RETICCTPCT in the last 72 hours. Sepsis Labs: Recent Labs  Lab 05/25/19 0709  LATICACIDVEN 1.3    Recent Results (from the past 240 hour(s))  SARS Coronavirus 2 by RT PCR (hospital order, performed in [facility] hospital lab) Nasopharyngeal Nasopharyngeal Swab     Status: None   Collection Time: 05/25/19  7:09 AM   Specimen: Nasopharyngeal Swab  Result Value Ref Range Status   SARS Coronavirus 2 NEGATIVE NEGATIVE Final    Comment: (NOTE) SARS-CoV-2 target nucleic acids are NOT DETECTED. The SARS-CoV-2 RNA is generally detectable in upper and lower respiratory specimens during the acute phase of infection. The lowest concentration of SARS-CoV-2 viral copies this assay can detect is 250 copies / mL. A negative result does not preclude SARS-CoV-2 infection and should not be used as the sole basis for treatment or other patient management decisions.  A negative result may occur with improper specimen collection / handling, submission of specimen other than nasopharyngeal swab, presence of viral mutation(s) within the areas targeted by this assay, and inadequate number of viral copies (<250 copies / mL). A negative result must  be combined with clinical observations, patient history, and epidemiological information. Fact Sheet for Patients:   StrictlyIdeas.no Fact Sheet for Healthcare Providers: BankingDealers.co.za This test is not yet approved or cleared  by the [place] FDA and has been authorized for detection and/or diagnosis of SARS-CoV-2 by FDA under an Emergency Use Authorization (EUA).  This EUA will remain in effect (meaning this test can be used) for the duration of the COVID-19 declaration under Section 564(b)(1) of the Act, 21 U.S.C. section 360bbb-3(b)(1), unless the authorization is terminated or revoked sooner. Performed at [facility], [address] [place], [place] [address]   MRSA PCR Screening     Status: None   Collection Time: 05/25/19  8:58 PM   Specimen: Nasopharyngeal  Result Value Ref Range Status   MRSA by PCR NEGATIVE NEGATIVE Final    Comment:  The GeneXpert MRSA Assay (FDA approved for NASAL specimens only), is one component of a comprehensive MRSA colonization surveillance program. It is not intended to diagnose MRSA infection nor to guide or monitor treatment for MRSA infections. Performed at [facility], [address] [place], [place] [address]       Radiology Studies: NM GI Blood Loss  Result Date: 05/25/2019 CLINICAL DATA:  Bright red blood per rectum. Two bloody bowel movements since arrival to ED requiring 2 units of blood. EXAM: NUCLEAR MEDICINE GASTROINTESTINAL BLEEDING SCAN TECHNIQUE: Sequential abdominal images were obtained following intravenous administration of Tc-82m labeled red blood cells. RADIOPHARMACEUTICALS:  24.3 mCi Tc-65m pertechnetate in-vitro labeled red cells. COMPARISON:  06/08/2015 FINDINGS: During the first hour of imaging there is no abnormal radiotracer accumulation to suggest active bleeding. Early in the second hour there is a faint focus of increased  radiotracer activity seen within the right colon. Over the course of the second hour there is rapid antegrade progression of the radiopharmaceutical throughout the transverse and left colon. IMPRESSION: 1. Examination is positive for active bleeding which originates in the right colon and exhibits rapid antegrade progression throughout the remainder of the colon. Electronically Signed   By: Kerby Moors M.D.   On: 05/25/2019 16:36   PERIPHERAL VASCULAR CATHETERIZATION  Result Date: 05/25/2019 See op note    Scheduled Meds: . Chlorhexidine Gluconate Cloth  6 each Topical Daily  . fluticasone furoate-vilanterol  1 puff Inhalation Daily   And  . umeclidinium bromide  1 puff Inhalation Daily  . latanoprost  1 drop Both Eyes QHS  . pantoprazole (PROTONIX) IV  40 mg Intravenous Q24H  . sodium chloride flush  3 mL Intravenous Q12H   Continuous Infusions: . sodium chloride 100 mL/hr at 05/26/19 1648  . sodium chloride       LOS: 1 day     Enzo Bi, MD [facility] If 7PM-7AM, please contact night-coverage 05/26/2019, 6:20 PM

## 2019-05-26 NOTE — Progress Notes (Signed)
[facility] Daily Progress Note   Subjective: 05/25/19: 1. Ultrasound guidance for vascular access right common femoral artery 2. Catheter placement into the middle colic artery and the right colic artery 3. Aortogram and selective angiogram of the superior mesenteric artery 4. Microbead embolization of 1 cc with 500-700  polyvinyl alcohol beads in the middle colic artery and 0.5 cc into the right colic artery. 5. StarClose closure device right femoral artery  Without complaint this AM.  Still experiencing bloody bowel movements.  No issues overnight.  Objective: Vitals:   05/26/19 0700 05/26/19 0800 05/26/19 0900 05/26/19 1000  BP: (!) 177/83 (!) 166/75 (!) 176/63 109/60  Pulse: 93 64 78 89  Resp: 15 18 (!) 28 15  Temp:  98.9 F (37.2 C)    TempSrc:  Axillary    SpO2: 100% 98% 99% 98%  Weight:      Height:        Intake/Output Summary (Last 24 hours) at 05/26/2019 1156 Last data filed at 05/26/2019 [id number] Gross per 24 hour  Intake 1978.51 ml  Output 651 ml  Net 1327.51 ml   Physical Exam: A&Ox2, NAD CV: Irregular irregular Pulmonary: CTA Bilaterally Abdomen: Soft, Nontender, Nondistended Right groin:  Access site: Clean dry and intact.  No swelling or ecchymosis noted. Vascular:  Bilateral lower extremity: Warm distally to toes.   Laboratory: CBC    Component Value Date/Time   WBC 8.7 05/26/2019 0508   HGB 14.6 05/26/2019 0508   HGB 14.4 10/13/2011 0614   HCT 42.3 05/26/2019 0508   HCT 43.3 10/13/2011 0614   PLT 151 05/26/2019 0508   PLT 251 10/13/2011 0614   BMET    Component Value Date/Time   NA 137 05/26/2019 0508   NA 136 10/13/2011 0614   K 4.2 05/26/2019 0508   K 4.3 10/13/2011 0614   CL 106 05/26/2019 0508   CL 101 10/13/2011 0614   CO2 24 05/26/2019 0508   CO2 26 10/13/2011 0614   GLUCOSE 77 05/26/2019 0508   GLUCOSE 115 (H) 10/13/2011 0614   BUN 20  05/26/2019 0508   BUN 17 10/13/2011 0614   CREATININE 0.75 05/26/2019 0508   CREATININE 0.92 07/15/2016 1544   CALCIUM 8.7 (L) 05/26/2019 0508   CALCIUM 9.2 10/13/2011 0614   GFRNONAA >60 05/26/2019 0508   GFRNONAA >60 10/13/2011 0614   GFRAA >60 05/26/2019 0508   GFRAA >60 10/13/2011 [id number]   Assessment/Planning: The patient is a 86 year old male who presented to the [facility] emergency department with GI bleed (right colon) s/p colonic embolization - POD#1  1) Patient still having dark-colored stools this AM - which is expected. 2) Hemoglobin stable. 3) Vitals stable 4) Physical exam/vitals/labs stable - active bleeding seems to have stopped.  Continue to monitor CBC.  Discussed with Tamera Stands PA-C 05/26/2019 11:56 AM

## 2019-05-26 NOTE — Progress Notes (Signed)
[facility] Inpatient Progress Note  Subjective: Patient seen for f/u right colonic bleed s/p angiography with microbead embolization by Vascular surgery. Patient appears well in no distress. NO red blood per rectum, though some residual old bloody discharge noted by nursing staff. No abdominal pain. In good spirits.  Objective: Vital signs in last 24 hours: Temp:  [97.7 F (36.5 C)-98.9 F (37.2 C)] 98.9 F (37.2 C) (05/13 0800) Pulse Rate:  [64-95] 85 (05/13 1200) Resp:  [13-28] 21 (05/13 1200) BP: (109-177)/(60-96) 147/77 (05/13 1200) SpO2:  [92 %-100 %] 99 % (05/13 1200) Weight:  [60.3 kg-61.5 kg] 61.5 kg (05/12 2100) Blood pressure (!) 147/77, pulse 85, temperature 98.9 F (37.2 C), temperature source Axillary, resp. rate (!) 21, height 5\' 6"  (1.676 m), weight 61.5 kg, SpO2 99 %.    Intake/Output from previous day: 05/12 0701 - 05/13 0700 In: 3248.5 [I.V.:1168.5; Blood:1080; IV Piggyback:1000] Out: 650 [Urine:650]  Intake/Output this shift: Total I/O In: 490 [P.O.:480; I.V.:10] Out: 1 [Urine:1]   General appearance: NAD, hard of hearing. Alert, conversive. Resp: CTA Cardio:  RRR GI: abdomen soft, nt, nd. BS+ Extremities:  No edema.   Lab Results: Results for orders placed or performed during the hospital encounter of 05/25/19 (from the past 24 hour(s))  Glucose, capillary     Status: Abnormal   Collection Time: 05/25/19  8:56 PM  Result Value Ref Range   Glucose-Capillary 68 (L) 70 - 99 mg/dL  MRSA PCR Screening     Status: None   Collection Time: 05/25/19  8:58 PM   Specimen: Nasopharyngeal  Result Value Ref Range   MRSA by PCR NEGATIVE NEGATIVE  Glucose, capillary     Status: Abnormal   Collection Time: 05/25/19  9:45 PM  Result Value Ref Range   Glucose-Capillary 151 (H) 70 - 99 mg/dL  CBC     Status: None   Collection Time: 05/26/19  5:08 AM  Result Value Ref Range   WBC 8.7 4.0 - 10.5 K/uL   RBC 4.64 4.22 - 5.81 MIL/uL    Hemoglobin 14.6 13.0 - 17.0 g/dL   HCT 42.3 39.0 - 52.0 %   MCV 91.2 80.0 - 100.0 fL   MCH 31.5 26.0 - 34.0 pg   MCHC 34.5 30.0 - 36.0 g/dL   RDW 14.6 11.5 - 15.5 %   Platelets 151 150 - 400 K/uL   nRBC 0.0 0.0 - 0.2 %  Basic metabolic panel     Status: Abnormal   Collection Time: 05/26/19  5:08 AM  Result Value Ref Range   Sodium 137 135 - 145 mmol/L   Potassium 4.2 3.5 - 5.1 mmol/L   Chloride 106 98 - 111 mmol/L   CO2 24 22 - 32 mmol/L   Glucose, Bld 77 70 - 99 mg/dL   BUN 20 8 - 23 mg/dL   Creatinine, Ser 0.75 0.61 - 1.24 mg/dL   Calcium 8.7 (L) 8.9 - 10.3 mg/dL   GFR calc non Af Amer >60 >60 mL/min   GFR calc Af Amer >60 >60 mL/min   Anion gap 7 5 - 15     Recent Labs    05/25/19 0421 05/25/19 1210 05/26/19 0508  WBC 7.8  --  8.7  HGB 10.8* 11.7* 14.6  HCT 31.8* 34.3* 42.3  PLT 195  --  151   BMET Recent Labs    05/25/19 0421 05/26/19 0508  NA 131* 137  K 4.8 4.2  CL 100 106  CO2 23 24  GLUCOSE 129* 77  BUN 30* 20  CREATININE 1.13 0.75  CALCIUM 8.7* 8.7*   LFT Recent Labs    05/25/19 0421  PROT 6.4*  ALBUMIN 4.0  AST 22  ALT 5  ALKPHOS 73  BILITOT 0.9   PT/INR Recent Labs    05/25/19 0421  LABPROT 14.9  INR 1.2   Hepatitis Panel No results for input(s): HEPBSAG, HCVAB, HEPAIGM, HEPBIGM in the last 72 hours. C-Diff No results for input(s): CDIFFTOX in the last 72 hours. No results for input(s): CDIFFPCR in the last 72 hours.   Studies/Results: NM GI Blood Loss  Result Date: 05/25/2019 CLINICAL DATA:  Bright red blood per rectum. Two bloody bowel movements since arrival to ED requiring 2 units of blood. EXAM: NUCLEAR MEDICINE GASTROINTESTINAL BLEEDING SCAN TECHNIQUE: Sequential abdominal images were obtained following intravenous administration of Tc-60m labeled red blood cells. RADIOPHARMACEUTICALS:  24.3 mCi Tc-25m pertechnetate in-vitro labeled red cells. COMPARISON:  06/08/2015 FINDINGS: During the first hour of imaging there is no  abnormal radiotracer accumulation to suggest active bleeding. Early in the second hour there is a faint focus of increased radiotracer activity seen within the right colon. Over the course of the second hour there is rapid antegrade progression of the radiopharmaceutical throughout the transverse and left colon. IMPRESSION: 1. Examination is positive for active bleeding which originates in the right colon and exhibits rapid antegrade progression throughout the remainder of the colon. Electronically Signed   By: Kerby Moors M.D.   On: 05/25/2019 16:36   PERIPHERAL VASCULAR CATHETERIZATION  Result Date: 05/25/2019 See op note   Scheduled Inpatient Medications:   . sodium chloride   Intravenous Once  . Chlorhexidine Gluconate Cloth  6 each Topical Daily  . fluticasone furoate-vilanterol  1 puff Inhalation Daily   And  . umeclidinium bromide  1 puff Inhalation Daily  . latanoprost  1 drop Both Eyes QHS  . pantoprazole (PROTONIX) IV  40 mg Intravenous Q24H  . sodium chloride flush  3 mL Intravenous Q12H    Continuous Inpatient Infusions:   . sodium chloride Stopped (05/25/19 1800)  . sodium chloride      PRN Inpatient Medications:  sodium chloride, acetaminophen, ondansetron (ZOFRAN) IV, ondansetron **OR** [DISCONTINUED] ondansetron (ZOFRAN) IV, sodium chloride flush    Assessment:  1. Acute right colonic diverticular bleed s/p vascular intervention with microbead embolization of branches of the right colic artery. Hemostasis clinically appears achieved. Appreciate vascular surgery evaluation and care.  Plan:  1. Advance diet as per Ms. Stegmayer/Dr. Schnier's recommendations. 2. Will sign off. Call back if needed.  Sumer Moorehouse K. Alice Reichert, M.D. 05/26/2019, 1:10 PM

## 2019-05-26 NOTE — Progress Notes (Signed)
Shift summary:  - Patient passes 2 medium-sized maroon stools this AM. - VS WNL and stable.

## 2019-05-26 NOTE — Plan of Care (Signed)

## 2019-05-27 ENCOUNTER — Telehealth: Payer: Self-pay | Admitting: Internal Medicine

## 2019-05-27 LAB — BASIC METABOLIC PANEL
Anion gap: 7 (ref 5–15)
BUN: 9 mg/dL (ref 8–23)
CO2: 24 mmol/L (ref 22–32)
Calcium: 8.3 mg/dL — ABNORMAL LOW (ref 8.9–10.3)
Chloride: 105 mmol/L (ref 98–111)
Creatinine, Ser: 0.64 mg/dL (ref 0.61–1.24)
GFR calc Af Amer: >60 mL/min (ref >60)
GFR calc non Af Amer: >60 mL/min (ref >60)
Glucose, Bld: 89 mg/dL (ref 70–99)
Potassium: 3.9 mmol/L (ref 3.5–5.1)
Sodium: 136 mmol/L (ref 135–145)

## 2019-05-27 LAB — CBC
HCT: 37 % — ABNORMAL LOW (ref 39.0–52.0)
Hemoglobin: 13 g/dL (ref 13.0–17.0)
MCH: 31.5 pg (ref 26.0–34.0)
MCHC: 35.1 g/dL (ref 30.0–36.0)
MCV: 89.6 fL (ref 80.0–100.0)
Platelets: 141 10*3/uL — ABNORMAL LOW (ref 150–400)
RBC: 4.13 MIL/uL — ABNORMAL LOW (ref 4.22–5.81)
RDW: 14.5 % (ref 11.5–15.5)
WBC: 7.7 10*3/uL (ref 4.0–10.5)
nRBC: 0 % (ref 0.0–0.2)

## 2019-05-27 LAB — MAGNESIUM: Magnesium: 1.7 mg/dL (ref 1.7–2.4)

## 2019-05-27 NOTE — Discharge Summary (Signed)
Physician Discharge Summary   Tyler Stout  male DOB: 1925-11-23  [id number]  PCP: [name], MD  Admit date: 05/25/2019 Discharge date: 05/27/2019  Admitted From: home Disposition:  home Wife and daughter updated on the day of discharge  CODE STATUS: Full code  Discharge Instructions    Increase activity slowly   Complete by: As directed        Hospital Course:  For full details, please see H&P, progress notes, consult notes and ancillary notes.  Briefly,  Tyler Rhynes Murrayis a 86 y.o. Caucasianmalewith medical history significant ofiron deficiency anemia, diverticulosis, A. fib not on anticoagulation, GI bleed, CAD status post CABG, hypertension, BPH, hyperlipidemia, hard of hearing who presented to the emergency department with chief complaint of bright red blood per rectum.    Acute right colonic diverticular bleed s/p vascular intervention with microbead embolization of branches of the right colic artery.  Pt had 3 or 4 episodes prior to presentation and 2 episodes while in the emergency department.  Hemoglobin 10.8 on admission.  Transfused 2 units PRBC.  Pt received embolization with vascular surgery.  Post-embolization, pt's Hgb was stable, and was 13 prior to discharge.  Pt was advised to follow up with PCP for a Hgb check about 1 week after discharge (communicated to the daughter).  Atrial fibrillation, rate controlled Not on anticoagulation due to history of GI bleed. Resumed home metop after hypotension resolved.  Hypertension.  Patient was quite hypotensive upon presentation. Home BP meds held initially.  BP started trending up prior to discharge, so home metop and ARB resumed.  Hyponatremia, mild, resolved Na improved with MIVF.    BPH continue home flomax  GERD continue home oral PPI  Depression continue home Celexa   Discharge Diagnoses:  Principal Problem:   Acute lower GI bleeding Active Problems:   Anemia   CAD  (coronary artery disease)   Hyponatremia   Atrial fibrillation (HCC)   Essential hypertension   HLD (hyperlipidemia)   Chronic bronchitis ([place])    Discharge Instructions:  Allergies as of 05/27/2019      Reactions   Ilosone [erythromycin] Other (See Comments)   Reaction:  GI upset    Penicillins Rash, Other (See Comments)   Has patient had a PCN reaction causing immediate rash, facial/tongue/throat swelling, SOB or lightheadedness with hypotension: No Has patient had a PCN reaction causing severe rash involving mucus membranes or skin necrosis: No Has patient had a PCN reaction that required hospitalization No Has patient had a PCN reaction occurring within the last 10 years: No If all of the above answers are "NO", then may proceed with Cephalosporin use.      Medication List    TAKE these medications   albuterol 108 (90 Base) MCG/ACT inhaler Commonly known as: VENTOLIN HFA Inhale 2 puffs into the lungs every 6 (six) hours as needed for wheezing or shortness of breath.   ascorbic acid 250 MG Chew Commonly known as: VITAMIN C Chew 250 mg by mouth daily.   aspirin EC 81 MG tablet Take 81 mg by mouth daily.   atorvastatin 20 MG tablet Commonly known as: LIPITOR TAKE 1 TABLET (20 MG TOTAL) BY MOUTH DAILY.   azelastine 0.1 % nasal spray Commonly known as: ASTELIN PLACE 1 SPRAY INTO BOTH NOSTRILS 2 (TWO) TIMES DAILY   Breo Ellipta 200-25 MCG/INH Aepb Generic drug: fluticasone furoate-vilanterol Inhale 1 puff into the lungs daily.   carbidopa-levodopa 50-200 MG tablet Commonly known as: SINEMET CR Take 1  tablet by mouth at bedtime.   citalopram 20 MG tablet Commonly known as: CELEXA TAKE 1 TABLET (20 MG TOTAL) BY MOUTH DAILY.   fluticasone 50 MCG/ACT nasal spray Commonly known as: FLONASE SPRAY 2 SPRAYS INTO EACH NOSTRIL EVERY DAY   furosemide 20 MG tablet Commonly known as: LASIX Take 20 mg by mouth 2 (two) times daily.   guaiFENesin 600 MG 12 hr  tablet Commonly known as: MUCINEX Take 600 mg by mouth 2 (two) times daily.   metoprolol tartrate 25 MG tablet Commonly known as: LOPRESSOR TAKE 1 TABLET BY MOUTH TWICE A DAY   multivitamin with minerals Tabs tablet Take 1 tablet by mouth daily.   pantoprazole 40 MG tablet Commonly known as: PROTONIX TAKE 1 TABLET BY MOUTH EVERY DAY   potassium chloride 10 MEQ tablet Commonly known as: KLOR-CON Take one tablet by mouth with lasix.   tamsulosin 0.4 MG Caps capsule Commonly known as: FLOMAX TAKE 1 CAPSULE BY MOUTH EVERY DAY   telmisartan 20 MG tablet Commonly known as: MICARDIS Take 20 mg by mouth daily.   Vitamin D 50 MCG (2000 UT) tablet Take 2,000 Units by mouth daily.   Xalatan 0.005 % ophthalmic solution Generic drug: latanoprost Place 1 drop into both eyes at bedtime.       Follow-up Information    [name], MD. Schedule an appointment as soon as possible for a visit in 1 week(s).   Specialty: Internal Medicine Contact information: [id number] [place] [address] [phone]           Allergies  Allergen Reactions  . Ilosone [Erythromycin] Other (See Comments)    Reaction:  GI upset   . Penicillins Rash and Other (See Comments)    Has patient had a PCN reaction causing immediate rash, facial/tongue/throat swelling, SOB or lightheadedness with hypotension: No Has patient had a PCN reaction causing severe rash involving mucus membranes or skin necrosis: No Has patient had a PCN reaction that required hospitalization No Has patient had a PCN reaction occurring within the last 10 years: No If all of the above answers are "NO", then may proceed with Cephalosporin use.     The results of significant diagnostics from this hospitalization (including imaging, microbiology, ancillary and laboratory) are listed below for reference.   Consultations:   Procedures/Studies: NM GI Blood Loss  Result Date:  05/25/2019 CLINICAL DATA:  Bright red blood per rectum. Two bloody bowel movements since arrival to ED requiring 2 units of blood. EXAM: NUCLEAR MEDICINE GASTROINTESTINAL BLEEDING SCAN TECHNIQUE: Sequential abdominal images were obtained following intravenous administration of Tc-36m labeled red blood cells. RADIOPHARMACEUTICALS:  24.3 mCi Tc-64m pertechnetate in-vitro labeled red cells. COMPARISON:  06/08/2015 FINDINGS: During the first hour of imaging there is no abnormal radiotracer accumulation to suggest active bleeding. Early in the second hour there is a faint focus of increased radiotracer activity seen within the right colon. Over the course of the second hour there is rapid antegrade progression of the radiopharmaceutical throughout the transverse and left colon. IMPRESSION: 1. Examination is positive for active bleeding which originates in the right colon and exhibits rapid antegrade progression throughout the remainder of the colon. Electronically Signed   By: Kerby Moors M.D.   On: 05/25/2019 16:36   PERIPHERAL VASCULAR CATHETERIZATION  Result Date: 05/25/2019 See op note     Labs: BNP (last 3 results) Recent Labs    07/28/18 1603 01/04/19 1253  BNP 235.0* [id number]*   Basic Metabolic Panel: Recent  Labs  Lab 05/25/19 0421 05/26/19 0508 05/27/19 0542  NA 131* 137 136  K 4.8 4.2 3.9  CL 100 106 105  CO2 23 24 24   GLUCOSE 129* 77 89  BUN 30* 20 9  CREATININE 1.13 0.75 0.64  CALCIUM 8.7* 8.7* 8.3*  MG  --   --  1.7   Liver Function Tests: Recent Labs  Lab 05/25/19 0421  AST 22  ALT 5  ALKPHOS 73  BILITOT 0.9  PROT 6.4*  ALBUMIN 4.0   No results for input(s): LIPASE, AMYLASE in the last 168 hours. No results for input(s): AMMONIA in the last 168 hours. CBC: Recent Labs  Lab 05/25/19 0421 05/25/19 1210 05/26/19 0508 05/27/19 0542  WBC 7.8  --  8.7 7.7  NEUTROABS 4.9  --   --   --   HGB 10.8* 11.7* 14.6 13.0  HCT 31.8* 34.3* 42.3 37.0*  MCV 93.0  --  91.2  89.6  PLT 195  --  151 141*   Cardiac Enzymes: No results for input(s): CKTOTAL, CKMB, CKMBINDEX, TROPONINI in the last 168 hours. BNP: Invalid input(s): POCBNP CBG: Recent Labs  Lab 05/25/19 2056 05/25/19 2145  GLUCAP 68* 151*   D-Dimer No results for input(s): DDIMER in the last 72 hours. Hgb A1c No results for input(s): HGBA1C in the last 72 hours. Lipid Profile No results for input(s): CHOL, HDL, LDLCALC, TRIG, CHOLHDL, LDLDIRECT in the last 72 hours. Thyroid function studies No results for input(s): TSH, T4TOTAL, T3FREE, THYROIDAB in the last 72 hours.  Invalid input(s): FREET3 Anemia work up No results for input(s): VITAMINB12, FOLATE, FERRITIN, TIBC, IRON, RETICCTPCT in the last 72 hours. Urinalysis    Component Value Date/Time   COLORURINE YELLOW (A) 07/28/2018 1603   APPEARANCEUR CLEAR (A) 07/28/2018 1603   APPEARANCEUR Hazy 03/01/2012 0720   LABSPEC 1.014 07/28/2018 1603   LABSPEC 1.016 03/01/2012 0720   PHURINE 8.0 07/28/2018 1603   GLUCOSEU NEGATIVE 07/28/2018 1603   GLUCOSEU NEGATIVE 06/27/2016 1221   HGBUR SMALL (A) 07/28/2018 1603   BILIRUBINUR NEGATIVE 07/28/2018 1603   BILIRUBINUR Negative 03/01/2012 0720   KETONESUR NEGATIVE 07/28/2018 1603   PROTEINUR 30 (A) 07/28/2018 1603   UROBILINOGEN 0.2 06/27/2016 1221   NITRITE NEGATIVE 07/28/2018 1603   LEUKOCYTESUR NEGATIVE 07/28/2018 1603   LEUKOCYTESUR Negative 03/01/2012 0720   Sepsis Labs Invalid input(s): PROCALCITONIN,  WBC,  LACTICIDVEN Microbiology Recent Results (from the past 240 hour(s))  SARS Coronavirus 2 by RT PCR (hospital order, performed in [place] hospital lab) Nasopharyngeal Nasopharyngeal Swab     Status: None   Collection Time: 05/25/19  7:09 AM   Specimen: Nasopharyngeal Swab  Result Value Ref Range Status   SARS Coronavirus 2 NEGATIVE NEGATIVE Final    Comment: (NOTE) SARS-CoV-2 target nucleic acids are NOT DETECTED. The SARS-CoV-2 RNA is generally detectable in upper and  lower respiratory specimens during the acute phase of infection. The lowest concentration of SARS-CoV-2 viral copies this assay can detect is 250 copies / mL. A negative result does not preclude SARS-CoV-2 infection and should not be used as the sole basis for treatment or other patient management decisions.  A negative result may occur with improper specimen collection / handling, submission of specimen other than nasopharyngeal swab, presence of viral mutation(s) within the areas targeted by this assay, and inadequate number of viral copies (<250 copies / mL). A negative result must be combined with clinical observations, patient history, and epidemiological information. Fact Sheet for Patients:  StrictlyIdeas.no Fact Sheet for Healthcare Providers: BankingDealers.co.za This test is not yet approved or cleared  by the [place] FDA and has been authorized for detection and/or diagnosis of SARS-CoV-2 by FDA under an Emergency Use Authorization (EUA).  This EUA will remain in effect (meaning this test can be used) for the duration of the COVID-19 declaration under Section 564(b)(1) of the Act, 21 U.S.C. section 360bbb-3(b)(1), unless the authorization is terminated or revoked sooner. Performed at [facility], [address] [place], [place] [address]   MRSA PCR Screening     Status: None   Collection Time: 05/25/19  8:58 PM   Specimen: Nasopharyngeal  Result Value Ref Range Status   MRSA by PCR NEGATIVE NEGATIVE Final    Comment:        The GeneXpert MRSA Assay (FDA approved for NASAL specimens only), is one component of a comprehensive MRSA colonization surveillance program. It is not intended to diagnose MRSA infection nor to guide or monitor treatment for MRSA infections. Performed at [facility], [address] [place], [place] [address]      Total time spend on discharging this patient,  including the last patient exam, discussing the hospital stay, instructions for ongoing care as it relates to all pertinent caregivers, as well as preparing the medical discharge records, prescriptions, and/or referrals as applicable, is 50 minutes.    Enzo Bi, MD  [facility] 05/27/2019, 9:19 AM  If 7PM-7AM, please contact night-coverage

## 2019-05-27 NOTE — Care Management Important Message (Signed)
Important Message  Patient Details  Name: Tyler Stout MRN: [id number] Date of Birth: Nov 24, 1925   Medicare Important Message Given:  N/A - LOS <3 / Initial given by admissions     Juliann Pulse A [name] 05/27/2019, 9:30 AM

## 2019-05-27 NOTE — Telephone Encounter (Signed)
[facility] called and wanted to schedule a hospital follow up within a week for acute lower GI bleed. There are no appts available. [place] [facility]

## 2019-05-27 NOTE — Telephone Encounter (Signed)
Tyler Stout can you secure scheduling and I will complete transition of care once discharged home.

## 2019-05-27 NOTE — Progress Notes (Signed)
Received MD order to discharge patient to home, reviewed home meds discharge instructions and follow up appointments with patient daughter and wife and both verbalized understanding.

## 2019-05-28 LAB — BPAM RBC
Blood Product Expiration Date: 202106082359
Blood Product Expiration Date: 202106082359
Blood Product Expiration Date: 202106082359
Blood Product Expiration Date: 202106082359
ISSUE DATE / TIME: 202105120442
ISSUE DATE / TIME: 202105120442
ISSUE DATE / TIME: 202105121025
Unit Type and Rh: 5100
Unit Type and Rh: 5100
Unit Type and Rh: 5100
Unit Type and Rh: 5100

## 2019-05-28 LAB — TYPE AND SCREEN
ABO/RH(D): O POS
Antibody Screen: NEGATIVE
Unit division: 0
Unit division: 0
Unit division: 0
Unit division: 0

## 2019-05-28 LAB — PREPARE RBC (CROSSMATCH)

## 2019-05-30 ENCOUNTER — Telehealth: Payer: Self-pay

## 2019-05-30 NOTE — Telephone Encounter (Signed)
Pt has been scheduled for 06/03/19

## 2019-05-30 NOTE — Telephone Encounter (Signed)
Transition Care Management Follow-up Telephone Call  Date of discharge and from where: 05/27/19 from [facility]  How have you been since you were released from the hospital? Doing so much better. Using walker and was able to go out to church yesterday. Pacing himself with activity. Denies bleeding/bloody stool, nausea, abdominal pain, headache, chest pain, dizziness and all other symptoms.   Any questions or concerns? None at this time.   Items Reviewed:  Did the pt receive and understand the discharge instructions provided? Yes  Medications obtained and verified? Daughter manages  Any new allergies since your discharge? No  Dietary orders reviewed? Yes, heart healthy  Do you have support at home? Yes, wife  Functional Questionnaire: (I = Independent and D = Dependent) ADLs: Wife assist as needed.  Eating- I  Maintaining continence- Urinary; daily pad/liner in use for leaks  Transferring/Ambulation- Walker in use  Managing Meds- Daughter manages  Follow up appointments reviewed:   PCP Hospital f/u appt confirmed? Scheduled to see Dr. Nicki Reaper on 06/03/19 @ 12:00.  Are transportation arrangements needed? No  If their condition worsens, is the pt aware to call PCP or go to the Emergency Dept.? Yes  Was the patient provided with contact information for the PCP's office or ED? Yes  Was to pt encouraged to call back with questions or concerns? Yes

## 2019-05-31 ENCOUNTER — Ambulatory Visit: Payer: Medicare HMO | Attending: Internal Medicine

## 2019-05-31 DIAGNOSIS — Z23 Encounter for immunization: Secondary | ICD-10-CM

## 2019-05-31 NOTE — Progress Notes (Signed)
   Covid-19 Vaccination Clinic  Name:  MAC DEFORE    MRN: [id number] DOB: December 19, 1925  05/31/2019  Mr. Eppinger was observed post Covid-19 immunization for 15 minutes without incident. He was provided with Vaccine Information Sheet and instruction to access the V-Safe system.   Mr. Lockett was instructed to call 911 with any severe reactions post vaccine: Marland Kitchen Difficulty breathing  . Swelling of face and throat  . A fast heartbeat  . A bad rash all over body  . Dizziness and weakness   Immunizations Administered    Name Date Dose VIS Date Route   Pfizer COVID-19 Vaccine 05/31/2019  2:09 PM 0.3 mL 03/09/2018 Intramuscular   Manufacturer:  [place]   Lot: [id number]   [place]: [id number]

## 2019-06-03 ENCOUNTER — Encounter: Payer: Self-pay | Admitting: Internal Medicine

## 2019-06-03 ENCOUNTER — Ambulatory Visit (INDEPENDENT_AMBULATORY_CARE_PROVIDER_SITE_OTHER): Payer: Medicare HMO | Admitting: Internal Medicine

## 2019-06-03 ENCOUNTER — Other Ambulatory Visit: Payer: Self-pay

## 2019-06-03 VITALS — BP 108/70 | HR 67 | Temp 96.5°F | Resp 16 | Ht 65.0 in | Wt 136.8 lb

## 2019-06-03 DIAGNOSIS — I700 Atherosclerosis of aorta: Secondary | ICD-10-CM | POA: Diagnosis not present

## 2019-06-03 DIAGNOSIS — R42 Dizziness and giddiness: Secondary | ICD-10-CM

## 2019-06-03 DIAGNOSIS — R600 Localized edema: Secondary | ICD-10-CM

## 2019-06-03 DIAGNOSIS — D508 Other iron deficiency anemias: Secondary | ICD-10-CM

## 2019-06-03 DIAGNOSIS — K922 Gastrointestinal hemorrhage, unspecified: Secondary | ICD-10-CM

## 2019-06-03 DIAGNOSIS — E7800 Pure hypercholesterolemia, unspecified: Secondary | ICD-10-CM

## 2019-06-03 DIAGNOSIS — I482 Chronic atrial fibrillation, unspecified: Secondary | ICD-10-CM

## 2019-06-03 DIAGNOSIS — I10 Essential (primary) hypertension: Secondary | ICD-10-CM | POA: Diagnosis not present

## 2019-06-03 DIAGNOSIS — G231 Progressive supranuclear ophthalmoplegia [Steele-Richardson-Olszewski]: Secondary | ICD-10-CM

## 2019-06-03 DIAGNOSIS — R634 Abnormal weight loss: Secondary | ICD-10-CM

## 2019-06-03 LAB — BASIC METABOLIC PANEL
BUN: 19 mg/dL (ref 6–23)
CO2: 29 mEq/L (ref 19–32)
Calcium: 8.8 mg/dL (ref 8.4–10.5)
Chloride: 102 mEq/L (ref 96–112)
Creatinine, Ser: 0.79 mg/dL (ref 0.40–1.50)
GFR: 91.75 mL/min (ref >60.00)
Glucose, Bld: 96 mg/dL (ref 70–99)
Potassium: 4.4 mEq/L (ref 3.5–5.1)
Sodium: 135 mEq/L (ref 135–145)

## 2019-06-03 LAB — CBC WITH DIFFERENTIAL/PLATELET
Basophils Absolute: 0 10*3/uL (ref 0.0–0.1)
Basophils Relative: 0.6 % (ref 0.0–3.0)
Eosinophils Absolute: 0.1 10*3/uL (ref 0.0–0.7)
Eosinophils Relative: 1.2 % (ref 0.0–5.0)
HCT: 37.6 % — ABNORMAL LOW (ref 39.0–52.0)
Hemoglobin: 12.6 g/dL — ABNORMAL LOW (ref 13.0–17.0)
Lymphocytes Relative: 13.2 % (ref 12.0–46.0)
Lymphs Abs: 0.8 10*3/uL (ref 0.7–4.0)
MCHC: 33.5 g/dL (ref 30.0–36.0)
MCV: 94.7 fl (ref 78.0–100.0)
Monocytes Absolute: 0.7 10*3/uL (ref 0.1–1.0)
Monocytes Relative: 11.7 % (ref 3.0–12.0)
Neutro Abs: 4.6 10*3/uL (ref 1.4–7.7)
Neutrophils Relative %: 73.3 % (ref 43.0–77.0)
Platelets: 224 10*3/uL (ref 150.0–400.0)
RBC: 3.97 Mil/uL — ABNORMAL LOW (ref 4.22–5.81)
RDW: 15.3 % (ref 11.5–15.5)
WBC: 6.3 10*3/uL (ref 4.0–10.5)

## 2019-06-03 NOTE — Progress Notes (Signed)
[name]: Tyler Stout, male   DOB: [birth year], 86 y.o.   MRN: [id number]   Subjective:    [name]: Tyler Stout, male    DOB: [birth year], 86 y.o.   MRN: [id number]  HPI This visit occurred during the SARS-CoV-2 public health emergency.  Safety protocols were in place, including screening questions prior to the visit, additional usage of staff PPE, and extensive cleaning of exam room while observing appropriate contact time as indicated for disinfecting solutions.  Patient here for hospital follow up.  He is accompanied by his daughter.  History obtained from both of them.  Was hospitalized from 05/25/19 - 05/27/19 with GI bleed.  He had 3-4 episodes of bleeding prior to presentation and 2 episodes while in the emergency room.  hgb 10.8 on admission.  Transfused 2 units prbc.  S/p embolization with vascular surgery.  Pt embolization - hgb stable and was 13 prior to discharge.  Since his discharge he has not noticed any further bleeding.  No chest pain.  Breathing stable.  Ambulating with his walker.  Went to church.  Lasix and telmisartan held while in hospital.  Back on lasix 2 days per week now.  Taking telmisartan and metoprolol - daily.  Eating.  No nausea or vomiting.  Some chronic dizziness.    Past Medical History:  Diagnosis Date  . Anemia    iron deficiency  . Arthritis   . Atrial fibrillation ([place])   . Bilateral renal cysts    worked up by Dr Bernardo Heater  . BPH (benign prostatic hypertrophy)   . CAD (coronary artery disease)    s/p bypass graft x 4 (1991)  . Diverticulosis   . GERD (gastroesophageal reflux disease)   . GI bleed   . Glaucoma   . Hypercholesterolemia   . Hypertension   . Pancreatitis    x2 (1994 and 1998), presumed to be gallstone pancreatitis.  s/p ERCP and sphincterotomy   Past Surgical History:  Procedure Laterality Date  . APPENDECTOMY    . CHOLECYSTECTOMY  1994  . CORONARY ARTERY BYPASS GRAFT     x 4,  1991  . EMBOLIZATION Right 05/25/2019   Procedure: EMBOLIZATION (Right Colon);  Surgeon: Katha Cabal, MD;  Location: [facility];  Service: Cardiovascular;  Laterality: Right;  . INGUINAL HERNIA REPAIR  2009  . open heart surgery  1991   Family History  Problem Relation Age of Onset  . Throat cancer Father        oropharyngeal cancer  . Kidney disease Sister   . Heart disease Other   . Colon cancer Neg Hx   . Prostate cancer Neg Hx    Social History   Socioeconomic History  . Marital status: Married    Spouse name: Not on file  . Number of children: Not on file  . Years of education: Not on file  . Highest education level: Not on file  Occupational History  . Not on file  Tobacco Use  . Smoking status: Former Smoker    Packs/day: 2.00    Years: 25.00    Pack years: 50.00    Types: Cigarettes    Quit date: 01/14/1964    Years since quitting: 55.4  . Smokeless tobacco: Never Used  . Tobacco comment: smoked 2 packs/day, 1 pipe and 1 cigar once in a while.  Substance and Sexual Activity  . Alcohol use: No    Alcohol/week: 0.0 standard drinks  . Drug use: No  .  Sexual activity: Not on file  Other Topics Concern  . Not on file  Social History Narrative  . Not on file   Social Determinants of Health   Financial Resource Strain:   . Difficulty of Paying Living Expenses:   Food Insecurity:   . Worried About Charity fundraiser in the Last Year:   . Arboriculturist in the Last Year:   Transportation Needs:   . Film/video editor (Medical):   Marland Kitchen Lack of Transportation (Non-Medical):   Physical Activity:   . Days of Exercise per Week:   . Minutes of Exercise per Session:   Stress:   . Feeling of Stress :   Social Connections:   . Frequency of Communication with Friends and Family:   . Frequency of Social Gatherings with Friends and Family:   . Attends Religious Services:   . Active Member of Clubs or Organizations:   . Attends Archivist Meetings:   Marland Kitchen Marital Status:      Outpatient Encounter Medications as of 06/03/2019  Medication Sig  . albuterol (VENTOLIN HFA) 108 (90 Base) MCG/ACT inhaler Inhale 2 puffs into the lungs every 6 (six) hours as needed for wheezing or shortness of breath.  Marland Kitchen ascorbic acid (VITAMIN C) 250 MG CHEW Chew 250 mg by mouth daily.  Marland Kitchen aspirin EC 81 MG tablet Take 81 mg by mouth daily.  Marland Kitchen atorvastatin (LIPITOR) 20 MG tablet TAKE 1 TABLET (20 MG TOTAL) BY MOUTH DAILY.  Marland Kitchen azelastine (ASTELIN) 0.1 % nasal spray PLACE 1 SPRAY INTO BOTH NOSTRILS 2 (TWO) TIMES DAILY  . BREO ELLIPTA 200-25 MCG/INH AEPB Inhale 1 puff into the lungs daily.  . carbidopa-levodopa (SINEMET CR) 50-200 MG tablet Take 1 tablet by mouth at bedtime.  . Cholecalciferol (VITAMIN D) 50 MCG (2000 UT) tablet Take 2,000 Units by mouth daily.  . citalopram (CELEXA) 20 MG tablet TAKE 1 TABLET (20 MG TOTAL) BY MOUTH DAILY.  . fluticasone (FLONASE) 50 MCG/ACT nasal spray SPRAY 2 SPRAYS INTO EACH NOSTRIL EVERY DAY  . furosemide (LASIX) 20 MG tablet Take 20 mg by mouth 2 (two) times daily.   Marland Kitchen guaiFENesin (MUCINEX) 600 MG 12 hr tablet Take 600 mg by mouth 2 (two) times daily.  Marland Kitchen latanoprost (XALATAN) 0.005 % ophthalmic solution Place 1 drop into both eyes at bedtime.   . metoprolol tartrate (LOPRESSOR) 25 MG tablet TAKE 1 TABLET BY MOUTH TWICE A DAY  . Multiple Vitamin (MULTIVITAMIN WITH MINERALS) TABS tablet Take 1 tablet by mouth daily.  . pantoprazole (PROTONIX) 40 MG tablet TAKE 1 TABLET BY MOUTH EVERY DAY  . potassium chloride (KLOR-CON) 10 MEQ tablet Take one tablet by mouth with lasix.  Marland Kitchen tamsulosin (FLOMAX) 0.4 MG CAPS capsule TAKE 1 CAPSULE BY MOUTH EVERY DAY  . telmisartan (MICARDIS) 20 MG tablet Take 20 mg by mouth daily.   No facility-administered encounter medications on file as of 06/03/2019.    Review of Systems  Constitutional: Negative for appetite change and unexpected weight change.  HENT: Negative for congestion and sinus pressure.   Respiratory:  Negative for cough, chest tightness and shortness of breath.   Cardiovascular: Negative for chest pain, palpitations and leg swelling.  Gastrointestinal: Negative for abdominal pain, diarrhea, nausea and vomiting.  Genitourinary: Negative for difficulty urinating and dysuria.  Musculoskeletal: Negative for joint swelling and myalgias.  Skin: Negative for color change and rash.  Neurological: Positive for light-headedness. Negative for headaches.  Psychiatric/Behavioral: Negative for agitation and dysphoric  mood.       Objective:    Physical Exam Vitals reviewed.  Constitutional:      General: He is not in acute distress.    Appearance: Normal appearance. He is well-developed.  HENT:     Head: Normocephalic and atraumatic.     Right Ear: External ear normal.     Left Ear: External ear normal.  Eyes:     General:        Right eye: No discharge.        Left eye: No discharge.     Conjunctiva/sclera: Conjunctivae normal.  Cardiovascular:     Rate and Rhythm: Normal rate and regular rhythm.  Pulmonary:     Effort: Pulmonary effort is normal. No respiratory distress.     Breath sounds: Normal breath sounds.  Abdominal:     General: Bowel sounds are normal.     Palpations: Abdomen is soft.     Tenderness: There is no abdominal tenderness.  Musculoskeletal:        General: No swelling or tenderness.     Cervical back: Neck supple. No tenderness.  Lymphadenopathy:     Cervical: No cervical adenopathy.  Skin:    Findings: No erythema or rash.  Neurological:     Mental Status: He is alert.  Psychiatric:        Mood and Affect: Mood normal.        Behavior: Behavior normal.     BP 108/70   Pulse 67   Temp (!) 96.5 F (35.8 C)   Resp 16   Ht 5\' 5"  (1.651 m)   Wt 136 lb 12.8 oz (62.1 kg)   SpO2 99%   BMI 22.76 kg/m  Wt Readings from Last 3 Encounters:  06/03/19 136 lb 12.8 oz (62.1 kg)  05/25/19 135 lb 9.3 oz (61.5 kg)  04/28/19 138 lb 6.4 oz (62.8 kg)     Lab  Results  Component Value Date   WBC 6.3 06/03/2019   HGB 12.6 (L) 06/03/2019   HCT 37.6 (L) 06/03/2019   PLT 224.0 06/03/2019   GLUCOSE 96 06/03/2019   CHOL 116 04/28/2019   TRIG 82.0 04/28/2019   HDL 47.90 04/28/2019   LDLCALC 51 04/28/2019   ALT 5 05/25/2019   AST 22 05/25/2019   NA 135 06/03/2019   K 4.4 06/03/2019   CL 102 06/03/2019   CREATININE 0.79 06/03/2019   BUN 19 06/03/2019   CO2 29 06/03/2019   TSH 1.06 04/28/2019   INR 1.2 05/25/2019   HGBA1C 6.3 04/28/2019    NM GI Blood Loss  Result Date: 05/25/2019 CLINICAL DATA:  Bright red blood per rectum. Two bloody bowel movements since arrival to ED requiring 2 units of blood. EXAM: NUCLEAR MEDICINE GASTROINTESTINAL BLEEDING SCAN TECHNIQUE: Sequential abdominal images were obtained following intravenous administration of Tc-42m labeled red blood cells. RADIOPHARMACEUTICALS:  24.3 mCi Tc-81m pertechnetate in-vitro labeled red cells. COMPARISON:  06/08/2015 FINDINGS: During the first hour of imaging there is no abnormal radiotracer accumulation to suggest active bleeding. Early in the second hour there is a faint focus of increased radiotracer activity seen within the right colon. Over the course of the second hour there is rapid antegrade progression of the radiopharmaceutical throughout the transverse and left colon. IMPRESSION: 1. Examination is positive for active bleeding which originates in the right colon and exhibits rapid antegrade progression throughout the remainder of the colon. Electronically Signed   By: Kerby Moors M.D.   On: 05/25/2019 16:36  PERIPHERAL VASCULAR CATHETERIZATION  Result Date: 05/25/2019 See op note      Assessment & Plan:   Problem List Items Addressed This Visit    Anemia - Primary    Has had GI w/up as outlined.  Recent embolization.  Required transfusion.  Recheck cbc today.        Relevant Orders   CBC with Differential/Platelet (Completed)   Chronic atrial fibrillation (HCC)     Off coumadin secondary to GI bleed.  Rated controlled.  On metoprolol.  Follow.        Essential hypertension    On micardis and metoprolol as outlined.  Continue current medication regimen.  Follow pressures.  Follow metabolic panel.       Relevant Orders   Basic metabolic panel (Completed)   Hypercholesteremia    On lipitor.  Follow lipid panel and liver function tests.        Light headedness    Chronic issue.  Has seen neurology.  Diagnosed with progressive supranuclear palsy.  On sinemet.  Stable.  Uses his walker.       Loss of weight    Weight stable.  Follow.        Lower extremity edema    Wearing compression socks.  Swelling improved.  Follow.  Takes lasix 2x/week.        Lower GI bleed    Recently admitted as outlined.  S/p embolization.  No further bleeding.  Required transfusion.  Recheck cbc today.        Progressive supranuclear palsy ([place])    Followed by neurology.        Thoracic aortic atherosclerosis (HCC)    On lipitor.            Tyler Pheasant, MD

## 2019-06-05 ENCOUNTER — Encounter: Payer: Self-pay | Admitting: Internal Medicine

## 2019-06-05 NOTE — Assessment & Plan Note (Signed)
Blood pressure as outlined on telmisartan and metoprolol.  Follow pressures.  Follow metabolic panel.

## 2019-06-05 NOTE — Assessment & Plan Note (Signed)
Wearing compression socks.  Swelling improved.  Follow.  Takes lasix 2x/week.

## 2019-06-05 NOTE — Assessment & Plan Note (Addendum)
Chronic issue.  Has seen neurology.  Diagnosed with progressive supranuclear palsy.  On sinemet.  Stable.  Uses his walker.

## 2019-06-05 NOTE — Assessment & Plan Note (Signed)
Has had GI w/up as outlined.  Recent embolization.  Required transfusion.  Recheck cbc today.

## 2019-06-05 NOTE — Assessment & Plan Note (Signed)
Followed by neurology.   

## 2019-06-05 NOTE — Assessment & Plan Note (Signed)
Recently admitted as outlined.  S/p embolization.  No further bleeding.  Required transfusion.  Recheck cbc today.

## 2019-06-05 NOTE — Assessment & Plan Note (Signed)
Weight stable.  Follow.  

## 2019-06-05 NOTE — Assessment & Plan Note (Signed)
On lipitor.  Follow lipid panel and liver function tests.   

## 2019-06-05 NOTE — Assessment & Plan Note (Signed)
On micardis and metoprolol as outlined.  Continue current medication regimen.  Follow pressures.  Follow metabolic panel.

## 2019-06-05 NOTE — Assessment & Plan Note (Signed)
Off coumadin secondary to GI bleed.  Rated controlled.  On metoprolol.  Follow.

## 2019-06-05 NOTE — Assessment & Plan Note (Signed)
On lipitor

## 2019-06-28 ENCOUNTER — Telehealth: Payer: Self-pay | Admitting: Internal Medicine

## 2019-06-28 NOTE — Telephone Encounter (Signed)
Levada Dy with [facility] needs a call back about home health orders from last July. She states that they were never signed off on. Please call back at [phone] ext [phone]

## 2019-06-30 NOTE — Telephone Encounter (Signed)
Back dated POC refaxed to [facility] at [facility]

## 2019-07-12 ENCOUNTER — Telehealth: Payer: Self-pay | Admitting: Internal Medicine

## 2019-07-12 ENCOUNTER — Inpatient Hospital Stay: Payer: Medicare HMO

## 2019-07-12 ENCOUNTER — Other Ambulatory Visit: Payer: Self-pay

## 2019-07-12 ENCOUNTER — Inpatient Hospital Stay
Admission: EM | Admit: 2019-07-12 | Discharge: 2019-07-14 | DRG: 378 | Disposition: A | Discharge status: Home / Self Care | Payer: Medicare HMO | Attending: Internal Medicine | Admitting: Internal Medicine

## 2019-07-12 ENCOUNTER — Encounter: Payer: Self-pay | Admitting: Intensive Care

## 2019-07-12 DIAGNOSIS — D509 Iron deficiency anemia, unspecified: Secondary | ICD-10-CM | POA: Diagnosis present

## 2019-07-12 DIAGNOSIS — Z8719 Personal history of other diseases of the digestive system: Secondary | ICD-10-CM | POA: Diagnosis not present

## 2019-07-12 DIAGNOSIS — I4891 Unspecified atrial fibrillation: Secondary | ICD-10-CM | POA: Diagnosis present

## 2019-07-12 DIAGNOSIS — Z951 Presence of aortocoronary bypass graft: Secondary | ICD-10-CM

## 2019-07-12 DIAGNOSIS — K625 Hemorrhage of anus and rectum: Secondary | ICD-10-CM | POA: Diagnosis present

## 2019-07-12 DIAGNOSIS — Z9049 Acquired absence of other specified parts of digestive tract: Secondary | ICD-10-CM

## 2019-07-12 DIAGNOSIS — I10 Essential (primary) hypertension: Secondary | ICD-10-CM | POA: Diagnosis present

## 2019-07-12 DIAGNOSIS — Z808 Family history of malignant neoplasm of other organs or systems: Secondary | ICD-10-CM

## 2019-07-12 DIAGNOSIS — Z881 Allergy status to other antibiotic agents status: Secondary | ICD-10-CM

## 2019-07-12 DIAGNOSIS — K219 Gastro-esophageal reflux disease without esophagitis: Secondary | ICD-10-CM | POA: Diagnosis present

## 2019-07-12 DIAGNOSIS — Z87891 Personal history of nicotine dependence: Secondary | ICD-10-CM | POA: Diagnosis not present

## 2019-07-12 DIAGNOSIS — Z7951 Long term (current) use of inhaled steroids: Secondary | ICD-10-CM | POA: Diagnosis not present

## 2019-07-12 DIAGNOSIS — Z79899 Other long term (current) drug therapy: Secondary | ICD-10-CM | POA: Diagnosis not present

## 2019-07-12 DIAGNOSIS — Z20822 Contact with and (suspected) exposure to covid-19: Secondary | ICD-10-CM | POA: Diagnosis present

## 2019-07-12 DIAGNOSIS — Z7982 Long term (current) use of aspirin: Secondary | ICD-10-CM

## 2019-07-12 DIAGNOSIS — H409 Unspecified glaucoma: Secondary | ICD-10-CM | POA: Diagnosis present

## 2019-07-12 DIAGNOSIS — E7800 Pure hypercholesterolemia, unspecified: Secondary | ICD-10-CM | POA: Diagnosis present

## 2019-07-12 DIAGNOSIS — Z888 Allergy status to other drugs, medicaments and biological substances status: Secondary | ICD-10-CM | POA: Diagnosis not present

## 2019-07-12 DIAGNOSIS — K922 Gastrointestinal hemorrhage, unspecified: Secondary | ICD-10-CM

## 2019-07-12 DIAGNOSIS — Z880 Allergy status to penicillin: Secondary | ICD-10-CM | POA: Diagnosis not present

## 2019-07-12 DIAGNOSIS — E785 Hyperlipidemia, unspecified: Secondary | ICD-10-CM | POA: Diagnosis present

## 2019-07-12 DIAGNOSIS — M1990 Unspecified osteoarthritis, unspecified site: Secondary | ICD-10-CM | POA: Diagnosis present

## 2019-07-12 DIAGNOSIS — K5731 Diverticulosis of large intestine without perforation or abscess with bleeding: Secondary | ICD-10-CM | POA: Diagnosis not present

## 2019-07-12 DIAGNOSIS — I482 Chronic atrial fibrillation, unspecified: Secondary | ICD-10-CM | POA: Diagnosis present

## 2019-07-12 DIAGNOSIS — Z841 Family history of disorders of kidney and ureter: Secondary | ICD-10-CM

## 2019-07-12 DIAGNOSIS — I2510 Atherosclerotic heart disease of native coronary artery without angina pectoris: Secondary | ICD-10-CM | POA: Diagnosis present

## 2019-07-12 DIAGNOSIS — N400 Enlarged prostate without lower urinary tract symptoms: Secondary | ICD-10-CM | POA: Diagnosis present

## 2019-07-12 DIAGNOSIS — H9190 Unspecified hearing loss, unspecified ear: Secondary | ICD-10-CM | POA: Diagnosis present

## 2019-07-12 LAB — HEMOGLOBIN AND HEMATOCRIT, BLOOD
HCT: 34.3 % — ABNORMAL LOW (ref 39.0–52.0)
Hemoglobin: 11.8 g/dL — ABNORMAL LOW (ref 13.0–17.0)

## 2019-07-12 LAB — COMPREHENSIVE METABOLIC PANEL
ALT: <5 U/L (ref 0–44)
AST: 22 U/L (ref 15–41)
Albumin: 4.1 g/dL (ref 3.5–5.0)
Alkaline Phosphatase: 74 U/L (ref 38–126)
Anion gap: 9 (ref 5–15)
BUN: 23 mg/dL (ref 8–23)
CO2: 25 mmol/L (ref 22–32)
Calcium: 8.8 mg/dL — ABNORMAL LOW (ref 8.9–10.3)
Chloride: 101 mmol/L (ref 98–111)
Creatinine, Ser: 0.9 mg/dL (ref 0.61–1.24)
GFR calc Af Amer: >60 mL/min (ref >60)
GFR calc non Af Amer: >60 mL/min (ref >60)
Glucose, Bld: 117 mg/dL — ABNORMAL HIGH (ref 70–99)
Potassium: 4 mmol/L (ref 3.5–5.1)
Sodium: 135 mmol/L (ref 135–145)
Total Bilirubin: 1.1 mg/dL (ref 0.3–1.2)
Total Protein: 6.5 g/dL (ref 6.5–8.1)

## 2019-07-12 LAB — CBC
HCT: 32.5 % — ABNORMAL LOW (ref 39.0–52.0)
Hemoglobin: 11 g/dL — ABNORMAL LOW (ref 13.0–17.0)
MCH: 32.1 pg (ref 26.0–34.0)
MCHC: 33.8 g/dL (ref 30.0–36.0)
MCV: 94.8 fL (ref 80.0–100.0)
Platelets: 211 10*3/uL (ref 150–400)
RBC: 3.43 MIL/uL — ABNORMAL LOW (ref 4.22–5.81)
RDW: 14.9 % (ref 11.5–15.5)
WBC: 5.6 10*3/uL (ref 4.0–10.5)
nRBC: 0 % (ref 0.0–0.2)

## 2019-07-12 LAB — TYPE AND SCREEN
ABO/RH(D): O POS
Antibody Screen: NEGATIVE

## 2019-07-12 LAB — SARS CORONAVIRUS 2 BY RT PCR (HOSPITAL ORDER, PERFORMED IN ~~LOC~~ HOSPITAL LAB): SARS Coronavirus 2: NEGATIVE

## 2019-07-12 MED ORDER — PEG 3350-KCL-NA BICARB-NACL 420 G PO SOLR
4000.0000 mL | Freq: Once | ORAL | Status: AC
  Administered 2019-07-12: 4000 mL via ORAL
  Filled 2019-07-12: qty 4000

## 2019-07-12 MED ORDER — TECHNETIUM TC 99M-LABELED RED BLOOD CELLS IV KIT
20.0000 | PACK | Freq: Once | INTRAVENOUS | Status: AC | PRN
  Administered 2019-07-12: 24.464 via INTRAVENOUS

## 2019-07-12 MED ORDER — SODIUM CHLORIDE 0.9 % IV SOLN: INTRAVENOUS | Status: DC

## 2019-07-12 MED ORDER — ONDANSETRON HCL 4 MG PO TABS: 4.0000 mg | ORAL_TABLET | Freq: Four times a day (QID) | ORAL | Status: DC | PRN

## 2019-07-12 MED ORDER — ONDANSETRON HCL 4 MG/2ML IJ SOLN: 4.0000 mg | Freq: Four times a day (QID) | INTRAMUSCULAR | Status: DC | PRN

## 2019-07-12 NOTE — ED Notes (Signed)
Pt still in nuclear med

## 2019-07-12 NOTE — ED Notes (Signed)
Pt in nuclear med

## 2019-07-12 NOTE — Telephone Encounter (Signed)
Spoken with daughter, patient is having dark red blood exiting during BM. BM is not black or tarry, patient is feeling similar to how he was when he had his previous lower GI bleeding. I recommended patient go back to ED since he is not established with GI. Daughter stated they would comply.

## 2019-07-12 NOTE — Consult Note (Signed)
Vonda Antigua, MD [address] [place], [place], [place], [address] [id number], [place], [place], [place], [address] Phone: [phone]  Fax: [phone]  Consultation  Referring Provider:     Dr. Francine Graven Primary Care Physician:  [name], MD Reason for Consultation:     Rectal bleeding  Date of Admission:  07/12/2019 Date of Consultation:  07/12/2019         HPI:   Tyler Stout is a 86 y.o. male with history of diverticulosis and recent diverticular bleeding with embolization of the right colic artery in May 2025, presents with repeat rectal bleeding for 1 day. No abdominal pain, N/V  As per last GI consult note by Dr. Alice Reichert, patient's last colonoscopy was in April 2015 by Dr. Vira Agar revealing left-sided diverticulosis and internal hemorrhoids.  Past Medical History:  Diagnosis Date  . Anemia    iron deficiency  . Arthritis   . Atrial fibrillation ([place])   . Bilateral renal cysts    worked up by Dr Bernardo Heater  . BPH (benign prostatic hypertrophy)   . CAD (coronary artery disease)    s/p bypass graft x 4 (1991)  . Diverticulosis   . GERD (gastroesophageal reflux disease)   . GI bleed   . Glaucoma   . Hypercholesterolemia   . Hypertension   . Pancreatitis    x2 (1994 and 1998), presumed to be gallstone pancreatitis.  s/p ERCP and sphincterotomy    Past Surgical History:  Procedure Laterality Date  . APPENDECTOMY    . CHOLECYSTECTOMY  1994  . CORONARY ARTERY BYPASS GRAFT     x 4,  1991  . EMBOLIZATION Right 05/25/2019   Procedure: EMBOLIZATION (Right Colon);  Surgeon: Katha Cabal, MD;  Location: [facility];  Service: Cardiovascular;  Laterality: Right;  . INGUINAL HERNIA REPAIR  2009  . open heart surgery  1991    Prior to Admission medications   Medication Sig Start Date End Date Taking? Authorizing Provider  albuterol (VENTOLIN HFA) 108 (90 Base) MCG/ACT inhaler Inhale 2 puffs into the lungs every 6 (six) hours as needed for  wheezing or shortness of breath.   Yes [provider]  ascorbic acid (VITAMIN C) 250 MG CHEW Chew 250 mg by mouth daily.   Yes [provider]  aspirin EC 81 MG tablet Take 81 mg by mouth at bedtime.    Yes [provider]  atorvastatin (LIPITOR) 20 MG tablet TAKE 1 TABLET (20 MG TOTAL) BY MOUTH DAILY. Patient taking differently: Take 20 mg by mouth every evening.  01/03/19  Yes [name], MD  azelastine (ASTELIN) 0.1 % nasal spray PLACE 1 SPRAY INTO BOTH NOSTRILS 2 (TWO) TIMES DAILY Patient taking differently: Place 1 spray into both nostrils 2 (two) times daily as needed for rhinitis or allergies.  12/17/18  Yes Scott, Randell Patient, MD  BREO ELLIPTA 200-25 MCG/INH AEPB Inhale 1 puff into the lungs daily. 12/28/18  Yes Kasa, Maretta Bees, MD  carbidopa-levodopa (SINEMET CR) 50-200 MG tablet Take 1 tablet by mouth at bedtime.   Yes [provider]  carbidopa-levodopa (SINEMET IR) 25-100 MG tablet Take 2 tablets by mouth 3 (three) times daily.   Yes [provider]  Cholecalciferol (VITAMIN D) 50 MCG (2000 UT) tablet Take 2,000 Units by mouth every evening.    Yes [provider]  citalopram (CELEXA) 20 MG tablet TAKE 1 TABLET (20 MG TOTAL) BY MOUTH DAILY. Patient taking differently: Take 20 mg by mouth daily.  04/11/19  Yes [name], MD  fluticasone (FLONASE) 50 MCG/ACT nasal spray SPRAY 2 SPRAYS INTO EACH NOSTRIL EVERY DAY Patient taking differently: Place 2 sprays into both nostrils daily as needed for allergies or rhinitis.  12/02/18  Yes [name], MD  furosemide (LASIX) 20 MG tablet Take 20 mg by mouth 2 (two) times a week. (take on Monday and Thursday)   Yes [provider]  guaiFENesin (MUCINEX) 600 MG 12 hr tablet Take 600 mg by mouth 2 (two) times daily.   Yes [provider]  latanoprost (XALATAN) 0.005 % ophthalmic solution Place 1 drop into both eyes at bedtime.    Yes [provider]  metoprolol  tartrate (LOPRESSOR) 25 MG tablet Take 25 mg by mouth 2 (two) times daily.    Yes [provider]  Multiple Vitamin (MULTIVITAMIN WITH MINERALS) TABS tablet Take 1 tablet by mouth every evening.    Yes [provider]  pantoprazole (PROTONIX) 40 MG tablet TAKE 1 TABLET BY MOUTH EVERY DAY Patient taking differently: Take 40 mg by mouth daily.  05/12/19  Yes [name], MD  potassium chloride (KLOR-CON) 10 MEQ tablet Take one tablet by mouth with lasix. Patient taking differently: Take 10 mEq by mouth 2 (two) times a week. (take with furosemide) 01/05/19  Yes [name], MD  tamsulosin (FLOMAX) 0.4 MG CAPS capsule TAKE 1 CAPSULE BY MOUTH EVERY DAY Patient taking differently: Take 0.4 mg by mouth daily.  05/19/19  Yes [name], MD  telmisartan (MICARDIS) 20 MG tablet Take 20 mg by mouth daily. 02/08/19  Yes [provider]    Family History  Problem Relation Age of Onset  . Throat cancer Father        oropharyngeal cancer  . Kidney disease Sister   . Heart disease Other   . Colon cancer Neg Hx   . Prostate cancer Neg Hx      Social History   Tobacco Use  . Smoking status: Former Smoker    Packs/day: 2.00    Years: 25.00    Pack years: 50.00    Types: Cigarettes    Quit date: 01/14/1964    Years since quitting: 55.5  . Smokeless tobacco: Never Used  . Tobacco comment: smoked 2 packs/day, 1 pipe and 1 cigar once in a while.  Substance Use Topics  . Alcohol use: No    Alcohol/week: 0.0 standard drinks  . Drug use: No    Allergies as of 07/12/2019 - Review Complete 07/12/2019  Allergen Reaction Noted  . Ilosone [erythromycin] Other (See Comments)   . Penicillins Rash and Other (See Comments)     Review of Systems:    All systems reviewed and negative except where noted in HPI.   Physical Exam:  Vital signs in last 24 hours: Vitals:   07/12/19 1125 07/12/19 1128  BP: (!) 118/58   Pulse: 74   Resp: 14   Temp: 98 F (36.7 C)    TempSrc: Oral   SpO2: 99%   Weight:  63.5 kg  Height:  5\' 5"  (1.651 m)     General:   Pleasant, cooperative in NAD Head:  Normocephalic and atraumatic. Eyes:   No icterus.   Conjunctiva pink. PERRLA. Ears:  Normal auditory acuity. Neck:  Supple; no masses or thyroidomegaly Lungs: Respirations even and unlabored. Lungs clear to auscultation bilaterally.   No wheezes, crackles, or rhonchi.  Abdomen:  Soft, nondistended, nontender. Normal bowel sounds. No appreciable masses or hepatomegaly.  No  rebound or guarding.  Neurologic:  Alert and oriented x3;  grossly normal neurologically. Skin:  Intact without significant lesions or rashes. Cervical Nodes:  No significant cervical adenopathy. Psych:  Alert and cooperative. Normal affect.  LAB RESULTS: Recent Labs    07/12/19 1132  WBC 5.6  HGB 11.0*  HCT 32.5*  PLT 211   BMET Recent Labs    07/12/19 1132  NA 135  K 4.0  CL 101  CO2 25  GLUCOSE 117*  BUN 23  CREATININE 0.90  CALCIUM 8.8*   LFT Recent Labs    07/12/19 1132  PROT 6.5  ALBUMIN 4.1  AST 22  ALT <5  ALKPHOS 74  BILITOT 1.1   PT/INR No results for input(s): LABPROT, INR in the last 72 hours.  STUDIES: No results found.    Impression / Plan:   SHAUNE WESTFALL is a 86 y.o. y/o male with recent diverticular bleeding requiring embolization in May 2021 due to positive RBC scan, presents for recurrent rectal bleeding for 1 day  Likely recurrent diverticular bleeding  If RBC scan is positive today please consult vascular surgery for embolization  If negative, pt and wife are agreeable to colonoscopy tomorrow  NPO past midnight  I have discussed alternative options, risks & benefits,  which include, but are not limited to, bleeding, infection, perforation,respiratory complication & drug reaction.  The patient agrees with this plan & written consent will be obtained.     Thank you for involving me in the care of this patient.      LOS: 0 days    Virgel Manifold, MD  07/12/2019, 3:48 PM

## 2019-07-12 NOTE — ED Notes (Addendum)
Resumed care from [facility] rn.  Admitting md at bedside with pt and family.  Pt alert.

## 2019-07-12 NOTE — ED Notes (Addendum)
Pt alert, iv fluids infusing.  Pt return from nuclear med.

## 2019-07-12 NOTE — Telephone Encounter (Signed)
Agree..  if having rectal bleeding needs to be evaluated.

## 2019-07-12 NOTE — Telephone Encounter (Signed)
Pt's daughter called and said about 6 weeks ago her dad had an intestinal bleed and had to be hospitalized. She said last night he noticed blood in his stool. It wasn't much but they are concerned and would like to know the next step they should take? Please advise.

## 2019-07-12 NOTE — ED Provider Notes (Signed)
[facility] Emergency Department Provider Note   ____________________________________________    I have reviewed the triage vital signs and the nursing notes.   HISTORY  Chief Complaint Rectal Bleeding     HPI Tyler Stout is a 86 y.o. male with a history of diverticulosis, anemia, atrial fibrillation not anticoagulated who presents with rectal bleeding.  Patient reports dark stool with blood noted over the last day.  He states he was in the hospital recently for something similar.  Review of medical records demonstrates patient was admitted on May 12 and had microbead embolization of branches of the right colic artery for colonic diverticular bleed.  Patient denies abdominal pain.  No fevers or chills.  No nausea or vomiting.  Has not take anything for this  Past Medical History:  Diagnosis Date  . Anemia    iron deficiency  . Arthritis   . Atrial fibrillation ([place])   . Bilateral renal cysts    worked up by Dr Bernardo Heater  . BPH (benign prostatic hypertrophy)   . CAD (coronary artery disease)    s/p bypass graft x 4 (1991)  . Diverticulosis   . GERD (gastroesophageal reflux disease)   . GI bleed   . Glaucoma   . Hypercholesterolemia   . Hypertension   . Pancreatitis    x2 (1994 and 1998), presumed to be gallstone pancreatitis.  s/p ERCP and sphincterotomy    Patient Active Problem List   Diagnosis Date Noted  . GI bleeding 05/25/2019  . Acute lower GI bleeding 05/25/2019  . Lower extremity edema 01/02/2019  . Chronic bronchitis ([place]) 10/10/2018  . Scalp lesion 10/10/2018  . Pneumonia 07/28/2018  . PNA (pneumonia) 05/07/2018  . Cough 05/05/2018  . Hyperglycemia 09/13/2017  . Decreased pedal pulses 04/01/2017  . Dizziness 02/07/2017  . Progressive supranuclear palsy ([place]) 11/02/2016  . Thoracic aortic atherosclerosis ([place]) 08/12/2016  . Abnormal CXR 07/18/2016  . Urinary frequency 06/20/2015  . Lower GI bleed   . Demand ischemia  ([place])   . Chronic atrial fibrillation ([place])   . Essential hypertension   . HLD (hyperlipidemia)   . BPH (benign prostatic hyperplasia)   . Tremor 06/10/2015  . Loss of weight 06/10/2015  . GI bleed 06/08/2015  . Health care maintenance 08/11/2014  . Light headedness 03/19/2014  . Back pain 03/13/2013  . Hypokalemia 02/20/2013  . CAD (coronary artery disease) 12/07/2011  . Hyponatremia 12/07/2011  . Atrial fibrillation ([place]) 12/07/2011  . Hypertension 11/20/2011  . Hypercholesteremia 11/20/2011  . Anemia 11/20/2011    Past Surgical History:  Procedure Laterality Date  . APPENDECTOMY    . CHOLECYSTECTOMY  1994  . CORONARY ARTERY BYPASS GRAFT     x 4,  1991  . EMBOLIZATION Right 05/25/2019   Procedure: EMBOLIZATION (Right Colon);  Surgeon: Katha Cabal, MD;  Location: [place] [facility];  Service: Cardiovascular;  Laterality: Right;  . INGUINAL HERNIA REPAIR  2009  . open heart surgery  1991    Prior to Admission medications   Medication Sig Start Date End Date Taking? Authorizing Provider  albuterol (VENTOLIN HFA) 108 (90 Base) MCG/ACT inhaler Inhale 2 puffs into the lungs every 6 (six) hours as needed for wheezing or shortness of breath.    [provider]  ascorbic acid (VITAMIN C) 250 MG CHEW Chew 250 mg by mouth daily.    [provider]  aspirin EC 81 MG tablet Take 81 mg by mouth daily.  [provider]  atorvastatin (LIPITOR) 20 MG tablet TAKE 1 TABLET (20 MG TOTAL) BY MOUTH DAILY. 01/03/19   [name], MD  azelastine (ASTELIN) 0.1 % nasal spray PLACE 1 SPRAY INTO BOTH NOSTRILS 2 (TWO) TIMES DAILY 12/17/18   [name], MD  BREO ELLIPTA 200-25 MCG/INH AEPB Inhale 1 puff into the lungs daily. 12/28/18   Flora Lipps, MD  carbidopa-levodopa (SINEMET CR) 50-200 MG tablet Take 1 tablet by mouth at bedtime.    [provider]  Cholecalciferol (VITAMIN D) 50 MCG (2000 UT) tablet Take 2,000 Units by mouth daily.     [provider]  citalopram (CELEXA) 20 MG tablet TAKE 1 TABLET (20 MG TOTAL) BY MOUTH DAILY. 04/11/19   [name], MD  fluticasone (FLONASE) 50 MCG/ACT nasal spray SPRAY 2 SPRAYS INTO EACH NOSTRIL EVERY DAY 12/02/18   [name], MD  furosemide (LASIX) 20 MG tablet Take 20 mg by mouth 2 (two) times daily.     [provider]  guaiFENesin (MUCINEX) 600 MG 12 hr tablet Take 600 mg by mouth 2 (two) times daily.    [provider]  latanoprost (XALATAN) 0.005 % ophthalmic solution Place 1 drop into both eyes at bedtime.     [provider]  metoprolol tartrate (LOPRESSOR) 25 MG tablet TAKE 1 TABLET BY MOUTH TWICE A DAY 05/27/16   [provider]  Multiple Vitamin (MULTIVITAMIN WITH MINERALS) TABS tablet Take 1 tablet by mouth daily.    [provider]  pantoprazole (PROTONIX) 40 MG tablet TAKE 1 TABLET BY MOUTH EVERY DAY 05/12/19   [name], MD  potassium chloride (KLOR-CON) 10 MEQ tablet Take one tablet by mouth with lasix. 01/05/19   [name], MD  tamsulosin (FLOMAX) 0.4 MG CAPS capsule TAKE 1 CAPSULE BY MOUTH EVERY DAY 05/19/19   [name], MD  telmisartan (MICARDIS) 20 MG tablet Take 20 mg by mouth daily. 02/08/19   [provider]     Allergies Ilosone [erythromycin] and Penicillins  Family History  Problem Relation Age of Onset  . Throat cancer Father        oropharyngeal cancer  . Kidney disease Sister   . Heart disease Other   . Colon cancer Neg Hx   . Prostate cancer Neg Hx     Social History Social History   Tobacco Use  . Smoking status: Former Smoker    Packs/day: 2.00    Years: 25.00    Pack years: 50.00    Types: Cigarettes    Quit date: 01/14/1964    Years since quitting: 55.5  . Smokeless tobacco: Never Used  . Tobacco comment: smoked 2 packs/day, 1 pipe and 1 cigar once in a while.  Substance Use Topics  . Alcohol use: No    Alcohol/week: 0.0 standard drinks  . Drug use: No     Review of Systems  Constitutional: No fever/chills Eyes: No visual changes.  ENT: No sore throat. Cardiovascular: Denies chest pain. Respiratory: Denies shortness of breath. Gastrointestinal: As above Genitourinary: Negative for dysuria. Musculoskeletal: Negative for back pain. Skin: Negative for rash. Neurological: Negative for headaches   ____________________________________________   PHYSICAL EXAM:  VITAL SIGNS: ED Triage Vitals  Enc Vitals Group     BP 07/12/19 1125 (!) 118/58     Pulse Rate 07/12/19 1125 74     Resp 07/12/19 1125 14     Temp 07/12/19 1125 98 F (36.7 C)     Temp Source 07/12/19 1125 Oral  SpO2 07/12/19 1125 99 %     Weight 07/12/19 1128 63.5 kg (140 lb)     Height 07/12/19 1128 1.651 m (5\' 5" )     Head Circumference --      Peak Flow --      Pain Score 07/12/19 1128 0     Pain Loc --      Pain Edu? --      Excl. in [place]? --     Constitutional: Alert and oriented. No acute distress.   Nose: No congestion/rhinnorhea. Mouth/Throat: Mucous membranes are moist.   Neck:  Painless ROM Cardiovascular: Normal rate, regular rhythm. Grossly normal heart sounds.  Good peripheral circulation. Respiratory: Normal respiratory effort.  No retractions. Lungs CTAB. Gastrointestinal: Soft and nontender. No distention.    Musculoskeletal: No lower extremity tenderness nor edema.  Warm and well perfused Neurologic:  Normal speech and language. No gross focal neurologic deficits are appreciated.  Skin:  Skin is warm, dry and intact. No rash noted. Psychiatric: Mood and affect are normal. Speech and behavior are normal.  ____________________________________________   LABS (all labs ordered are listed, but only abnormal results are displayed)  Labs Reviewed  COMPREHENSIVE METABOLIC PANEL - Abnormal; Notable for the following components:      Result Value   Glucose, Bld 117 (*)    Calcium 8.8 (*)    All other components within normal limits  CBC -  Abnormal; Notable for the following components:   RBC 3.43 (*)    Hemoglobin 11.0 (*)    HCT 32.5 (*)    All other components within normal limits  POC OCCULT BLOOD, ED  TYPE AND SCREEN   ____________________________________________  EKG  None ____________________________________________  RADIOLOGY   ____________________________________________   PROCEDURES  Procedure(s) performed: No  Procedures   Critical Care performed: No ____________________________________________   INITIAL IMPRESSION / ASSESSMENT AND PLAN / ED COURSE  Pertinent labs & imaging results that were available during my care of the patient were reviewed by me and considered in my medical decision making (see chart for details).  Patient presents with rectal bleeding as noted above.  History of diverticular bleed requiring embolization 1.5 months ago.  No abdominal pain, reassuring exam.  Vital signs are stable.  Blood work is notable for hemoglobin of 11, down approximately 2 g from May 21.  I suspect recurrence of diverticular bleeding, differential also includes upper GI bleed, diverticulosis.  Given history and hemoglobin drop will consult the hospitalist service for admission    ____________________________________________   FINAL CLINICAL IMPRESSION(S) / ED DIAGNOSES  Final diagnoses:  Gastrointestinal hemorrhage, unspecified gastrointestinal hemorrhage type        Note:  This document was prepared using Dragon voice recognition software and may include unintentional dictation errors.   Lavonia Drafts, MD 07/12/19 1335

## 2019-07-12 NOTE — Telephone Encounter (Signed)
Left message for patients daughter to return call back.

## 2019-07-12 NOTE — H&P (Signed)
History and Physical    Tyler Stout:[id number] DOB: 1925/09/29 DOA: 07/12/2019  PCP: [name], MD   Patient coming from: Home  I have personally briefly reviewed patient's old medical records in [place]  Chief Complaint: Rectal bleed.  HPI: Tyler Stout is a 86 y.o. male with medical history significant for iron deficiency anemia, history of diverticulosis with recent diverticular bleed about a month ago, A. fib not on anticoagulation, CAD status post CABG, hypertension, BPH, hyperlipidemia, hard of hearing who presents to the emergency room for evaluation of rectal bleeding which he states  initially started as bright red blood per rectum but this morning had become maroon-colored.  He has had multiple episodes over the last 24 hours. Patient was hospitalized about 6 weeks ago for a similar episode and at that time had microbead embolization of branches of the right colic artery for colonic diverticular bleed.  He was discharged home in stable condition. He denies having any abdominal pain, no fever, no chills, no nausea, no vomiting, no dizziness or lightheadedness.  He denies having any episodes of loss of consciousness. He denies NSAID use. His hemoglobin today is 11g/dl down from 12.6 when he was discharged in May He is currently normotensive.     ED Course: Patient is a 86 year old male who presents to the emergency room for evaluation of rectal bleeding for 24 hours.  Patient has a history of diverticulosis and was simply admitted to the hospital for a diverticular bleed about 6 weeks ago.  Patient is status post embolization of the right colonic artery for colonic diverticular bleed.  Will be referred to observation for further evaluation  Review of Systems: As per HPI otherwise 10 point review of systems negative.    Past Medical History:  Diagnosis Date  . Anemia    iron deficiency  . Arthritis   . Atrial fibrillation ([place])   . Bilateral renal  cysts    worked up by Dr Bernardo Heater  . BPH (benign prostatic hypertrophy)   . CAD (coronary artery disease)    s/p bypass graft x 4 (1991)  . Diverticulosis   . GERD (gastroesophageal reflux disease)   . GI bleed   . Glaucoma   . Hypercholesterolemia   . Hypertension   . Pancreatitis    x2 (1994 and 1998), presumed to be gallstone pancreatitis.  s/p ERCP and sphincterotomy    Past Surgical History:  Procedure Laterality Date  . APPENDECTOMY    . CHOLECYSTECTOMY  1994  . CORONARY ARTERY BYPASS GRAFT     x 4,  1991  . EMBOLIZATION Right 05/25/2019   Procedure: EMBOLIZATION (Right Colon);  Surgeon: Katha Cabal, MD;  Location: [facility];  Service: Cardiovascular;  Laterality: Right;  . INGUINAL HERNIA REPAIR  2009  . open heart surgery  1991     reports that he quit smoking about 55 years ago. His smoking use included cigarettes. He has a 50.00 pack-year smoking history. He has never used smokeless tobacco. He reports that he does not drink alcohol and does not use drugs.  Allergies  Allergen Reactions  . Ilosone [Erythromycin] Other (See Comments)    Reaction:  GI upset   . Penicillins Rash and Other (See Comments)    Has patient had a PCN reaction causing immediate rash, facial/tongue/throat swelling, SOB or lightheadedness with hypotension: No Has patient had a PCN reaction causing severe rash involving mucus membranes or skin necrosis: No Has patient had a  PCN reaction that required hospitalization No Has patient had a PCN reaction occurring within the last 10 years: No If all of the above answers are "NO", then may proceed with Cephalosporin use.    Family History  Problem Relation Age of Onset  . Throat cancer Father        oropharyngeal cancer  . Kidney disease Sister   . Heart disease Other   . Colon cancer Neg Hx   . Prostate cancer Neg Hx      Prior to Admission medications   Medication Sig Start Date End Date Taking? Authorizing Provider   albuterol (VENTOLIN HFA) 108 (90 Base) MCG/ACT inhaler Inhale 2 puffs into the lungs every 6 (six) hours as needed for wheezing or shortness of breath.    [provider]  ascorbic acid (VITAMIN C) 250 MG CHEW Chew 250 mg by mouth daily.    [provider]  aspirin EC 81 MG tablet Take 81 mg by mouth daily.    [provider]  atorvastatin (LIPITOR) 20 MG tablet TAKE 1 TABLET (20 MG TOTAL) BY MOUTH DAILY. 01/03/19   [name], MD  azelastine (ASTELIN) 0.1 % nasal spray PLACE 1 SPRAY INTO BOTH NOSTRILS 2 (TWO) TIMES DAILY 12/17/18   [name], MD  BREO ELLIPTA 200-25 MCG/INH AEPB Inhale 1 puff into the lungs daily. 12/28/18   Flora Lipps, MD  carbidopa-levodopa (SINEMET CR) 50-200 MG tablet Take 1 tablet by mouth at bedtime.    [provider]  Cholecalciferol (VITAMIN D) 50 MCG (2000 UT) tablet Take 2,000 Units by mouth daily.    [provider]  citalopram (CELEXA) 20 MG tablet TAKE 1 TABLET (20 MG TOTAL) BY MOUTH DAILY. 04/11/19   [name], MD  fluticasone (FLONASE) 50 MCG/ACT nasal spray SPRAY 2 SPRAYS INTO EACH NOSTRIL EVERY DAY 12/02/18   [name], MD  furosemide (LASIX) 20 MG tablet Take 20 mg by mouth 2 (two) times daily.     [provider]  guaiFENesin (MUCINEX) 600 MG 12 hr tablet Take 600 mg by mouth 2 (two) times daily.    [provider]  latanoprost (XALATAN) 0.005 % ophthalmic solution Place 1 drop into both eyes at bedtime.     [provider]  metoprolol tartrate (LOPRESSOR) 25 MG tablet TAKE 1 TABLET BY MOUTH TWICE A DAY 05/27/16   [provider]  Multiple Vitamin (MULTIVITAMIN WITH MINERALS) TABS tablet Take 1 tablet by mouth daily.    [provider]  pantoprazole (PROTONIX) 40 MG tablet TAKE 1 TABLET BY MOUTH EVERY DAY 05/12/19   [name], MD  potassium chloride (KLOR-CON) 10 MEQ tablet Take one tablet by mouth with lasix. 01/05/19   [name], MD   tamsulosin (FLOMAX) 0.4 MG CAPS capsule TAKE 1 CAPSULE BY MOUTH EVERY DAY 05/19/19   [name], MD  telmisartan (MICARDIS) 20 MG tablet Take 20 mg by mouth daily. 02/08/19   [provider]    Physical Exam: Vitals:   07/12/19 1125 07/12/19 1128  BP: (!) 118/58   Pulse: 74   Resp: 14   Temp: 98 F (36.7 C)   TempSrc: Oral   SpO2: 99%   Weight:  63.5 kg  Height:  5\' 5"  (1.651 m)     Vitals:   07/12/19 1125 07/12/19 1128  BP: (!) 118/58   Pulse: 74   Resp: 14   Temp: 98 F (36.7 C)   TempSrc: Oral   SpO2: 99%   Weight:  63.5 kg  Height:  5\' 5"  (1.651 m)    Constitutional: NAD, alert and oriented x 3.  Chronically ill-appearing, Frail Eyes: PERRL, lids and conjunctivae pallor ENMT: Mucous membranes are moist.  Neck: normal, supple, no masses, no thyromegaly Respiratory: clear to auscultation bilaterally, no wheezing, no crackles. Normal respiratory effort. No accessory muscle use.  Cardiovascular: Irregularly irregular, no murmurs / rubs / gallops. No extremity edema. 2+ pedal pulses. No carotid bruits.  Abdomen: no tenderness, no masses palpated. No hepatosplenomegaly. Bowel sounds positive.  Musculoskeletal: no clubbing / cyanosis. No joint deformity upper and lower extremities.  Skin: no rashes, lesions, ulcers.  Neurologic: No gross focal neurologic deficit. Psychiatric: Normal mood and affect.   Labs on Admission: I have personally reviewed following labs and imaging studies  CBC: Recent Labs  Lab 07/12/19 1132  WBC 5.6  HGB 11.0*  HCT 32.5*  MCV 94.8  PLT 416   Basic Metabolic Panel: Recent Labs  Lab 07/12/19 1132  NA 135  K 4.0  CL 101  CO2 25  GLUCOSE 117*  BUN 23  CREATININE 0.90  CALCIUM 8.8*   GFR: Estimated Creatinine Clearance: 45.6 mL/min (by C-G formula based on SCr of 0.9 mg/dL). Liver Function Tests: Recent Labs  Lab 07/12/19 1132  AST 22  ALT <5  ALKPHOS 74  BILITOT 1.1  PROT 6.5  ALBUMIN 4.1   No results  for input(s): LIPASE, AMYLASE in the last 168 hours. No results for input(s): AMMONIA in the last 168 hours. Coagulation Profile: No results for input(s): INR, PROTIME in the last 168 hours. Cardiac Enzymes: No results for input(s): CKTOTAL, CKMB, CKMBINDEX, TROPONINI in the last 168 hours. BNP (last 3 results) No results for input(s): PROBNP in the last 8760 hours. HbA1C: No results for input(s): HGBA1C in the last 72 hours. CBG: No results for input(s): GLUCAP in the last 168 hours. Lipid Profile: No results for input(s): CHOL, HDL, LDLCALC, TRIG, CHOLHDL, LDLDIRECT in the last 72 hours. Thyroid Function Tests: No results for input(s): TSH, T4TOTAL, FREET4, T3FREE, THYROIDAB in the last 72 hours. Anemia Panel: No results for input(s): VITAMINB12, FOLATE, FERRITIN, TIBC, IRON, RETICCTPCT in the last 72 hours. Urine analysis:    Component Value Date/Time   COLORURINE YELLOW (A) 07/28/2018 1603   APPEARANCEUR CLEAR (A) 07/28/2018 1603   APPEARANCEUR Hazy 03/01/2012 0720   LABSPEC 1.014 07/28/2018 1603   LABSPEC 1.016 03/01/2012 0720   PHURINE 8.0 07/28/2018 1603   GLUCOSEU NEGATIVE 07/28/2018 1603   GLUCOSEU NEGATIVE 06/27/2016 1221   HGBUR SMALL (A) 07/28/2018 1603   BILIRUBINUR NEGATIVE 07/28/2018 1603   BILIRUBINUR Negative 03/01/2012 0720   KETONESUR NEGATIVE 07/28/2018 1603   PROTEINUR 30 (A) 07/28/2018 1603   UROBILINOGEN 0.2 06/27/2016 1221   NITRITE NEGATIVE 07/28/2018 1603   LEUKOCYTESUR NEGATIVE 07/28/2018 1603   LEUKOCYTESUR Negative 03/01/2012 0720    Radiological Exams on Admission: No results found.  EKG: Independently reviewed.   Assessment/Plan Principal Problem:   Rectal bleeding Active Problems:   CAD (coronary artery disease)   Atrial fibrillation ([facility])   Chronic atrial fibrillation ([facility])   History of GI diverticular bleed     Rectal bleeding Painless and possibly diverticular bleed Patient had a recent embolization of the right colonic  artery for a colonic diverticular bleed We will monitor serial H&H Type and screen We will request GI consult   History of chronic atrial fibrillation Patient not on long-term anticoagulation due to GI bleed Hold metoprolol for now  Hypertension Patient is normotensive We will hold all antihypertensive medications for now     DVT prophylaxis: SCD Code Status: Full code Family Communication: Greater than 50% of time was spent discussing plan of care with patient  and his wife at the bedside.  He verbalizes understanding and agrees with the plan. Disposition Plan: Back to previous home environment Consults called: GI    Viyaan Champine MD [facility]     07/12/2019, 2:19 PM

## 2019-07-12 NOTE — ED Triage Notes (Signed)
PAtient c/o rectal bleeding for a few days. Reports he was just released from hospital not to long ago for same.

## 2019-07-12 NOTE — Telephone Encounter (Signed)
Pt is at the ED. °

## 2019-07-12 NOTE — ED Notes (Signed)
Report called to debbie rn floor nurse

## 2019-07-13 DIAGNOSIS — K922 Gastrointestinal hemorrhage, unspecified: Secondary | ICD-10-CM

## 2019-07-13 LAB — HEMOGLOBIN AND HEMATOCRIT, BLOOD
HCT: 29.3 % — ABNORMAL LOW (ref 39.0–52.0)
HCT: 30.6 % — ABNORMAL LOW (ref 39.0–52.0)
Hemoglobin: 10 g/dL — ABNORMAL LOW (ref 13.0–17.0)
Hemoglobin: 10.5 g/dL — ABNORMAL LOW (ref 13.0–17.0)

## 2019-07-13 MED ORDER — TAMSULOSIN HCL 0.4 MG PO CAPS
0.4000 mg | ORAL_CAPSULE | Freq: Every day | ORAL | Status: DC
  Administered 2019-07-13 – 2019-07-14 (×2): 0.4 mg via ORAL
  Filled 2019-07-13 (×2): qty 1

## 2019-07-13 MED ORDER — LATANOPROST 0.005 % OP SOLN
1.0000 [drp] | Freq: Every day | OPHTHALMIC | Status: DC
  Filled 2019-07-13 (×2): qty 2.5

## 2019-07-13 MED ORDER — CITALOPRAM HYDROBROMIDE 20 MG PO TABS
20.0000 mg | ORAL_TABLET | Freq: Every day | ORAL | Status: DC
  Administered 2019-07-13 – 2019-07-14 (×2): 20 mg via ORAL
  Filled 2019-07-13 (×2): qty 1

## 2019-07-13 MED ORDER — ATORVASTATIN CALCIUM 20 MG PO TABS
20.0000 mg | ORAL_TABLET | Freq: Every evening | ORAL | Status: DC
  Administered 2019-07-14: 20 mg via ORAL
  Filled 2019-07-13 (×2): qty 1

## 2019-07-13 MED ORDER — FLUTICASONE FUROATE-VILANTEROL 200-25 MCG/INH IN AEPB
1.0000 | INHALATION_SPRAY | Freq: Every day | RESPIRATORY_TRACT | Status: DC
  Administered 2019-07-14: 1 via RESPIRATORY_TRACT
  Filled 2019-07-13: qty 28

## 2019-07-13 MED ORDER — METOPROLOL TARTRATE 25 MG PO TABS
25.0000 mg | ORAL_TABLET | Freq: Two times a day (BID) | ORAL | Status: DC
  Administered 2019-07-13 – 2019-07-14 (×2): 25 mg via ORAL
  Filled 2019-07-13 (×2): qty 1

## 2019-07-13 MED ORDER — CARBIDOPA-LEVODOPA 25-100 MG PO TABS
2.0000 | ORAL_TABLET | Freq: Three times a day (TID) | ORAL | Status: DC
  Administered 2019-07-13 – 2019-07-14 (×4): 2 via ORAL
  Filled 2019-07-13 (×6): qty 2

## 2019-07-13 MED ORDER — CARBIDOPA-LEVODOPA ER 50-200 MG PO TBCR
1.0000 | EXTENDED_RELEASE_TABLET | Freq: Every day | ORAL | Status: DC
  Administered 2019-07-13: 1 via ORAL
  Filled 2019-07-13 (×2): qty 1

## 2019-07-13 MED ORDER — ALBUTEROL SULFATE HFA 108 (90 BASE) MCG/ACT IN AERS: 2.0000 | INHALATION_SPRAY | Freq: Four times a day (QID) | RESPIRATORY_TRACT | Status: DC | PRN

## 2019-07-13 MED ORDER — PEG 3350-KCL-NA BICARB-NACL 420 G PO SOLR
2000.0000 mL | Freq: Once | ORAL | Status: AC
  Administered 2019-07-13: 2000 mL via ORAL
  Filled 2019-07-13: qty 4000

## 2019-07-13 MED ORDER — ALBUTEROL SULFATE (2.5 MG/3ML) 0.083% IN NEBU: 2.5000 mg | INHALATION_SOLUTION | Freq: Four times a day (QID) | RESPIRATORY_TRACT | Status: DC | PRN

## 2019-07-13 NOTE — Progress Notes (Signed)
PROGRESS NOTE    ACESON LABELL  TJQ:[id number] DOB: 12/15/1925 DOA: 07/12/2019 PCP: [name], MD   Chief complaint.  Rectal bleeding.  Brief Narrative: (Start on day 1 of progress note - keep it brief and live) Tyler Stout is a 86 y.o. male with medical history significant foriron deficiency anemia, history of diverticulosis with recent diverticular bleed about a month ago, A. fib not on anticoagulation, CAD status post CABG, hypertension, BPH, hyperlipidemia, hard of hearing who presents to the emergency room for evaluation of rectal bleeding which he states  initially started as bright red blood per rectum but this morning had become maroon-colored.  He has had multiple episodes over the last 24 hours. Patient was hospitalized about 6 weeks ago for a similar episode and at that time had microbead embolization of branches of the right colic artery for colonic diverticular bleed.  He was discharged home in stable condition.  Patient is seen by gastroenterology.  Bleeding scan did not show any active bleeding.  Colonoscopy is scheduled.       Assessment & Plan:   Principal Problem:   Rectal bleeding Active Problems:   CAD (coronary artery disease)   Atrial fibrillation (HCC)   Chronic atrial fibrillation (HCC)   History of GI diverticular bleed   Rectal bleed  #1.  Rectal bleeding. Hemoglobin is stable.  After bowel prep, patient did not have additional rectal bleeding.  Patient has been seen by GI, bleeding scan was negative.  Pending colonoscopy tomorrow.  2.  Chronic atrial fibrillation.  Not chronically on anticoagulation.  3.  Essential hypertension. Blood pressures well, hold off blood pressure medicine for now.    DVT prophylaxis: SCds Code Status: Full Family Communication: Wife at the bedside  Disposition Plan:  . Patient came from: Home            . Anticipated d/c place: Home . Barriers to d/c OR conditions which need to be met to effect a safe  d/c:   Consultants:   GI  Procedures:  Pending colonoscopy Antimicrobials: None  Subjective: Patient did not have additional bleeding after bowel prep last night.  No nausea vomiting.  No abdominal pain. No short of breath or cough. No fever chills  Objective: Vitals:   07/12/19 2042 07/13/19 0517 07/13/19 0736 07/13/19 1148  BP: (!) 145/60 127/68 (!) 148/73 (!) 152/63  Pulse: 65 72 80 83  Resp: 20 20    Temp: 97.7 F (36.5 C) 97.8 F (36.6 C) 97.8 F (36.6 C) 98.3 F (36.8 C)  TempSrc: Oral Oral Oral Oral  SpO2: 100% 99% 98% 99%  Weight:      Height:        Intake/Output Summary (Last 24 hours) at 07/13/2019 1224 Last data filed at 07/13/2019 0300 Gross per 24 hour  Intake 1133.82 ml  Output --  Net 1133.82 ml   Filed Weights   07/12/19 1128  Weight: 63.5 kg    Examination:  General exam: Appears calm and comfortable  Respiratory system: Clear to auscultation. Respiratory effort normal. Cardiovascular system: S1 & S2 heard, RRR. No JVD, murmurs, rubs, gallops or clicks. No pedal edema. Gastrointestinal system: Abdomen is nondistended, soft and nontender. No organomegaly or masses felt. Normal bowel sounds heard. Central nervous system: Alert and oriented. No focal neurological deficits. Extremities: Symmetric 5 x 5 power. Skin: No rashes, lesions or ulcers Psychiatry: Judgement and insight appear normal. Mood & affect appropriate.     Data Reviewed: I have personally  reviewed following labs and imaging studies  CBC: Recent Labs  Lab 07/12/19 1132 07/12/19 1910 07/13/19 0500  WBC 5.6  --   --   HGB 11.0* 11.8* 10.0*  HCT 32.5* 34.3* 29.3*  MCV 94.8  --   --   PLT 211  --   --    Basic Metabolic Panel: Recent Labs  Lab 07/12/19 1132  NA 135  K 4.0  CL 101  CO2 25  GLUCOSE 117*  BUN 23  CREATININE 0.90  CALCIUM 8.8*   GFR: Estimated Creatinine Clearance: 45.6 mL/min (by C-G formula based on SCr of 0.9 mg/dL). Liver Function  Tests: Recent Labs  Lab 07/12/19 1132  AST 22  ALT <5  ALKPHOS 74  BILITOT 1.1  PROT 6.5  ALBUMIN 4.1   No results for input(s): LIPASE, AMYLASE in the last 168 hours. No results for input(s): AMMONIA in the last 168 hours. Coagulation Profile: No results for input(s): INR, PROTIME in the last 168 hours. Cardiac Enzymes: No results for input(s): CKTOTAL, CKMB, CKMBINDEX, TROPONINI in the last 168 hours. BNP (last 3 results) No results for input(s): PROBNP in the last 8760 hours. HbA1C: No results for input(s): HGBA1C in the last 72 hours. CBG: No results for input(s): GLUCAP in the last 168 hours. Lipid Profile: No results for input(s): CHOL, HDL, LDLCALC, TRIG, CHOLHDL, LDLDIRECT in the last 72 hours. Thyroid Function Tests: No results for input(s): TSH, T4TOTAL, FREET4, T3FREE, THYROIDAB in the last 72 hours. Anemia Panel: No results for input(s): VITAMINB12, FOLATE, FERRITIN, TIBC, IRON, RETICCTPCT in the last 72 hours. Sepsis Labs: No results for input(s): PROCALCITON, LATICACIDVEN in the last 168 hours.  Recent Results (from the past 240 hour(s))  SARS Coronavirus 2 by RT PCR (hospital order, performed in [facility] hospital lab) Nasopharyngeal Nasopharyngeal Swab     Status: None   Collection Time: 07/12/19  2:01 PM   Specimen: Nasopharyngeal Swab  Result Value Ref Range Status   SARS Coronavirus 2 NEGATIVE NEGATIVE Final    Comment: (NOTE) SARS-CoV-2 target nucleic acids are NOT DETECTED.  The SARS-CoV-2 RNA is generally detectable in upper and lower respiratory specimens during the acute phase of infection. The lowest concentration of SARS-CoV-2 viral copies this assay can detect is 250 copies / mL. A negative result does not preclude SARS-CoV-2 infection and should not be used as the sole basis for treatment or other patient management decisions.  A negative result may occur with improper specimen collection / handling, submission of specimen other than  nasopharyngeal swab, presence of viral mutation(s) within the areas targeted by this assay, and inadequate number of viral copies (<250 copies / mL). A negative result must be combined with clinical observations, patient history, and epidemiological information.  Fact Sheet for Patients:   StrictlyIdeas.no  Fact Sheet for Healthcare Providers: BankingDealers.co.za  This test is not yet approved or  cleared by the [place] FDA and has been authorized for detection and/or diagnosis of SARS-CoV-2 by FDA under an Emergency Use Authorization (EUA).  This EUA will remain in effect (meaning this test can be used) for the duration of the COVID-19 declaration under Section 564(b)(1) of the Act, 21 U.S.C. section 360bbb-3(b)(1), unless the authorization is terminated or revoked sooner.  Performed at [facility], [address], [place], [place] [address]          Radiology Studies: [place] GI Blood Loss  Result Date: 07/12/2019 CLINICAL DATA:  Lower GI bleeding. EXAM: NUCLEAR MEDICINE GASTROINTESTINAL BLEEDING  SCAN TECHNIQUE: Sequential abdominal images were obtained following intravenous administration of Tc-58mlabeled red blood cells. RADIOPHARMACEUTICALS:  24.46 mCi Tc-946mertechnetate in-vitro labeled red cells. COMPARISON:  May 25, 2019 FINDINGS: There is normal distribution of radiotracer on the provided images. Radiotracer is seen accumulating in the urinary bladder. There is radiotracer projecting over the low midline pelvis which may be penile in location and is similar to the patient's prior study from May 25, 2019. There is no evidence for active GI bleed on this study. IMPRESSION: No evidence for active GI bleeding. Electronically Signed   By: ChConstance Holster.D.   On: 07/12/2019 18:32        Scheduled Meds: . atorvastatin  20 mg Oral QPM  . carbidopa-levodopa  2 tablet Oral TID  . citalopram  20 mg Oral Daily   . fluticasone furoate-vilanterol  1 puff Inhalation Daily  . metoprolol tartrate  25 mg Oral BID  . tamsulosin  0.4 mg Oral Daily   Continuous Infusions: . sodium chloride 100 mL/hr at 07/13/19 0219     LOS: 1 day    Time spent: 26 minutes    DeSharen HonesMD [facility]   To contact the attending provider between 7A-7P or the covering provider during after hours 7P-7A, please log into the web site [email or web address] and access using universal Miguel Barrera password for that web site. If you do not have the password, please call the hospital operator.  07/13/2019, 12:24 PM

## 2019-07-13 NOTE — Progress Notes (Signed)
Vonda Antigua, MD [address] [place] [place], [place], [place], [address] [address], [place], [place], [place], [address] Phone: [phone]  Fax: [phone]   Subjective: Patient only drank half his prep.  Therefore, colonoscopy could not be done   Objective: Exam: Vital signs in last 24 hours: Vitals:   07/13/19 0517 07/13/19 0736 07/13/19 1148 07/13/19 1552  BP: 127/68 (!) 148/73 (!) 152/63 (!) 162/61  Pulse: 72 80 83 66  Resp: 20   18  Temp: 97.8 F (36.6 C) 97.8 F (36.6 C) 98.3 F (36.8 C) 97.8 F (36.6 C)  TempSrc: Oral Oral Oral   SpO2: 99% 98% 99% 100%  Weight:      Height:       Weight change:   Intake/Output Summary (Last 24 hours) at 07/13/2019 1609 Last data filed at 07/13/2019 0300 Gross per 24 hour  Intake 1133.82 ml  Output --  Net 1133.82 ml    General: No acute distress, AAO x3 Abd: Soft, NT/ND, No HSM Skin: Warm, no rashes Neck: Supple, Trachea midline   Lab Results: Lab Results  Component Value Date   WBC 5.6 07/12/2019   HGB 10.0 (L) 07/13/2019   HCT 29.3 (L) 07/13/2019   MCV 94.8 07/12/2019   PLT 211 07/12/2019   Micro Results: Recent Results (from the past 240 hour(s))  SARS Coronavirus 2 by RT PCR (hospital order, performed in [place] hospital lab) Nasopharyngeal Nasopharyngeal Swab     Status: None   Collection Time: 07/12/19  2:01 PM   Specimen: Nasopharyngeal Swab  Result Value Ref Range Status   SARS Coronavirus 2 NEGATIVE NEGATIVE Final    Comment: (NOTE) SARS-CoV-2 target nucleic acids are NOT DETECTED.  The SARS-CoV-2 RNA is generally detectable in upper and lower respiratory specimens during the acute phase of infection. The lowest concentration of SARS-CoV-2 viral copies this assay can detect is 250 copies / mL. A negative result does not preclude SARS-CoV-2 infection and should not be used as the sole basis for treatment or other patient management decisions.  A negative result may occur  with improper specimen collection / handling, submission of specimen other than nasopharyngeal swab, presence of viral mutation(s) within the areas targeted by this assay, and inadequate number of viral copies (<250 copies / mL). A negative result must be combined with clinical observations, patient history, and epidemiological information.  Fact Sheet for Patients:   StrictlyIdeas.no  Fact Sheet for Healthcare Providers: BankingDealers.co.za  This test is not yet approved or  cleared by the [place] FDA and has been authorized for detection and/or diagnosis of SARS-CoV-2 by FDA under an Emergency Use Authorization (EUA).  This EUA will remain in effect (meaning this test can be used) for the duration of the COVID-19 declaration under Section 564(b)(1) of the Act, 21 U.S.C. section 360bbb-3(b)(1), unless the authorization is terminated or revoked sooner.  Performed at [facility], [address] [place], [place] [address]    Studies/Results: [place] GI Blood Loss  Result Date: 07/12/2019 CLINICAL DATA:  Lower GI bleeding. EXAM: NUCLEAR MEDICINE GASTROINTESTINAL BLEEDING SCAN TECHNIQUE: Sequential abdominal images were obtained following intravenous administration of Tc-63m labeled red blood cells. RADIOPHARMACEUTICALS:  24.46 mCi Tc-28m pertechnetate in-vitro labeled red cells. COMPARISON:  May 25, 2019 FINDINGS: There is normal distribution of radiotracer on the provided images. Radiotracer is seen accumulating in the urinary bladder. There is radiotracer projecting over the low midline pelvis which may be penile in location and is similar to the  patient's prior study from May 25, 2019. There is no evidence for active GI bleed on this study. IMPRESSION: No evidence for active GI bleeding. Electronically Signed   By: Constance Holster M.D.   On: 07/12/2019 18:32   Medications:  Scheduled Meds: . atorvastatin  20 mg Oral QPM   . carbidopa-levodopa  1 tablet Oral QHS  . carbidopa-levodopa  2 tablet Oral TID  . citalopram  20 mg Oral Daily  . fluticasone furoate-vilanterol  1 puff Inhalation Daily  . metoprolol tartrate  25 mg Oral BID  . tamsulosin  0.4 mg Oral Daily   Continuous Infusions: . sodium chloride 100 mL/hr at 07/13/19 0219   PRN Meds:.albuterol, ondansetron **OR** ondansetron (ZOFRAN) IV   Assessment: Principal Problem:   Rectal bleeding Active Problems:   CAD (coronary artery disease)   Atrial fibrillation (HCC)   Chronic atrial fibrillation (HCC)   History of GI diverticular bleed   Rectal bleed    Plan: Colonoscopy rescheduled for tomorrow to evaluate source of GI bleed Patient hemodynamically stable Patient encouraged to complete his entire prep Clear liquid diet today N.p.o. past midnight   LOS: 1 day   Vonda Antigua, MD 07/13/2019, 4:09 PM

## 2019-07-14 ENCOUNTER — Inpatient Hospital Stay: Payer: Medicare HMO | Admitting: Registered Nurse

## 2019-07-14 ENCOUNTER — Encounter
Admission: EM | Disposition: A | Discharge status: Home / Self Care | Payer: Self-pay | Source: Home / Self Care | Attending: Internal Medicine

## 2019-07-14 HISTORY — PX: COLONOSCOPY: SHX5424

## 2019-07-14 LAB — CBC WITH DIFFERENTIAL/PLATELET
Abs Immature Granulocytes: 0.03 10*3/uL (ref 0.00–0.07)
Basophils Absolute: 0.1 10*3/uL (ref 0.0–0.1)
Basophils Relative: 1 %
Eosinophils Absolute: 0.1 10*3/uL (ref 0.0–0.5)
Eosinophils Relative: 1 %
HCT: 30.8 % — ABNORMAL LOW (ref 39.0–52.0)
Hemoglobin: 10.7 g/dL — ABNORMAL LOW (ref 13.0–17.0)
Immature Granulocytes: 1 %
Lymphocytes Relative: 22 %
Lymphs Abs: 1.2 10*3/uL (ref 0.7–4.0)
MCH: 32.3 pg (ref 26.0–34.0)
MCHC: 34.7 g/dL (ref 30.0–36.0)
MCV: 93.1 fL (ref 80.0–100.0)
Monocytes Absolute: 0.6 10*3/uL (ref 0.1–1.0)
Monocytes Relative: 12 %
Neutro Abs: 3.5 10*3/uL (ref 1.7–7.7)
Neutrophils Relative %: 63 %
Platelets: 187 10*3/uL (ref 150–400)
RBC: 3.31 MIL/uL — ABNORMAL LOW (ref 4.22–5.81)
RDW: 15.1 % (ref 11.5–15.5)
WBC: 5.5 10*3/uL (ref 4.0–10.5)
nRBC: 0 % (ref 0.0–0.2)

## 2019-07-14 LAB — BASIC METABOLIC PANEL
Anion gap: 8 (ref 5–15)
BUN: 10 mg/dL (ref 8–23)
CO2: 25 mmol/L (ref 22–32)
Calcium: 8.4 mg/dL — ABNORMAL LOW (ref 8.9–10.3)
Chloride: 102 mmol/L (ref 98–111)
Creatinine, Ser: 0.74 mg/dL (ref 0.61–1.24)
GFR calc Af Amer: >60 mL/min (ref >60)
GFR calc non Af Amer: >60 mL/min (ref >60)
Glucose, Bld: 94 mg/dL (ref 70–99)
Potassium: 4 mmol/L (ref 3.5–5.1)
Sodium: 135 mmol/L (ref 135–145)

## 2019-07-14 SURGERY — COLONOSCOPY
Anesthesia: General

## 2019-07-14 MED ORDER — LIDOCAINE HCL (CARDIAC) PF 100 MG/5ML IV SOSY
PREFILLED_SYRINGE | INTRAVENOUS | Status: DC | PRN
  Administered 2019-07-14: 60 mg via INTRAVENOUS

## 2019-07-14 MED ORDER — PHENYLEPHRINE HCL (PRESSORS) 10 MG/ML IV SOLN
INTRAVENOUS | Status: DC | PRN
  Administered 2019-07-14 (×2): 100 ug via INTRAVENOUS

## 2019-07-14 MED ORDER — SODIUM CHLORIDE 0.9 % IV SOLN: INTRAVENOUS | Status: DC

## 2019-07-14 MED ORDER — PROPOFOL 10 MG/ML IV BOLUS
INTRAVENOUS | Status: DC | PRN
  Administered 2019-07-14: 40 mg via INTRAVENOUS

## 2019-07-14 MED ORDER — PROPOFOL 500 MG/50ML IV EMUL
INTRAVENOUS | Status: DC | PRN
  Administered 2019-07-14: 125 ug/kg/min via INTRAVENOUS

## 2019-07-14 NOTE — Telephone Encounter (Signed)
[facility] called to schedule a HFU appt scheduled for 07/22/19 @ 1:30

## 2019-07-14 NOTE — Care Plan (Signed)
Patient s/p colonoscopy -  Tolerated procedure  will  See full report in Epic Report called to RN

## 2019-07-14 NOTE — Op Note (Signed)
[facility] Gastroenterology Patient Name: Tyler Stout Procedure Date: 07/14/2019 12:16 PM MRN: [id number] Account #: [id number] Date of Birth: 1925-07-23 Admit Type: Inpatient Age: 86 Room: [facility] ENDO ROOM 4 Gender: Male Note Status: Finalized Procedure:             Colonoscopy Indications:           Hematochezia Providers:              Grosser B. Bonna Gains MD, MD Referring MD:          [name], MD (Referring MD) Medicines:             Monitored Anesthesia Care Complications:         No immediate complications. Procedure:             Pre-Anesthesia Assessment:                        - Prior to the procedure, a History and Physical was                         performed, and patient medications, allergies and                         sensitivities were reviewed. The patient's tolerance                         of previous anesthesia was reviewed.                        - The risks and benefits of the procedure and the                         sedation options and risks were discussed with the                         patient. All questions were answered and informed                         consent was obtained.                        - Patient identification and proposed procedure were                         verified prior to the procedure by the physician, the                         nurse, the anesthetist and the technician. The                         procedure was verified in the pre-procedure area in                         the procedure room in the endoscopy suite.                        - ASA Grade Assessment: II - A patient with mild  systemic disease.                        - After reviewing the risks and benefits, the patient                         was deemed in satisfactory condition to undergo the                         procedure.                        After obtaining informed consent, the colonoscope was                          passed under direct vision. Throughout the procedure,                         the patient's blood pressure, pulse, and oxygen                         saturations were monitored continuously. The                         Colonoscope was introduced through the anus and                         advanced to the the cecum, identified by appendiceal                         orifice and ileocecal valve. The colonoscopy was                         performed with ease. The patient tolerated the                         procedure well. The quality of the bowel preparation                         was fair. Findings:      The perianal and digital rectal examinations were normal.      Multiple diverticula were found in the entire colon.      The exam was otherwise without abnormality.      The retroflexed view of the distal rectum and anal verge was normal and       showed no anal or rectal abnormalities. Impression:            - Preparation of the colon was fair.                        - Diverticulosis in the entire examined colon.                        - The examination was otherwise normal.                        - The distal rectum and anal verge are normal on                         retroflexion  view.                        - No specimens collected.                        - No evidence of active or recent bleeding seen                         throughout the exam.                        - Source of bleeding was likely diverticular bleeding                         that has now resolved. Recommendation:        - Resume previous diet.                        - Continue present medications.                        - Return to primary care physician as previously                         scheduled.                        - The findings and recommendations were discussed with                         the patient.                        - The findings and recommendations were discussed with                          the patient's family.                        - High fiber diet. Procedure Code(s):     --- Professional ---                        [id number], Colonoscopy, flexible; diagnostic, including                         collection of specimen(s) by brushing or washing, when                         performed (separate procedure) Diagnosis Code(s):     --- Professional ---                        K92.1, Melena (includes Hematochezia)                        K57.30, Diverticulosis of large intestine without                         perforation or abscess without bleeding CPT copyright 2019 American Medical Association. All rights reserved. The codes documented in this report are preliminary and upon coder review may  be revised to  meet current compliance requirements.  Vonda Antigua, MD Margretta Sidle B. Bonna Gains MD, MD 07/14/2019 12:41:23 PM This report has been signed electronically. Number of Addenda: 0 Note Initiated On: 07/14/2019 12:16 PM Scope Withdrawal Time: 0 hours 5 minutes 41 seconds  Total Procedure Duration: 0 hours 9 minutes 45 seconds       [facility]

## 2019-07-14 NOTE — Anesthesia Preprocedure Evaluation (Signed)
Anesthesia Evaluation  Patient identified by MRN, date of birth, ID band Patient awake    Reviewed: Allergy & Precautions, H&P , NPO status , Patient's Chart, lab work & pertinent test results, reviewed documented beta blocker date and time   Airway Mallampati: II   Neck ROM: full    Dental  (+) Poor Dentition   Pulmonary pneumonia, resolved, former smoker,    Pulmonary exam normal        Cardiovascular Exercise Tolerance: Poor hypertension, On Medications + CAD  Normal cardiovascular exam Rhythm:regular Rate:Normal     Neuro/Psych negative neurological ROS  negative psych ROS   GI/Hepatic Neg liver ROS, GERD  Medicated,  Endo/Other  negative endocrine ROS  Renal/GU Renal disease  negative genitourinary   Musculoskeletal   Abdominal   Peds  Hematology  (+) Blood dyscrasia, anemia ,   Anesthesia Other Findings Past Medical History: No date: Anemia     Comment:  iron deficiency No date: Arthritis No date: Atrial fibrillation (HCC) No date: Bilateral renal cysts     Comment:  worked up by Dr Bernardo Heater No date: BPH (benign prostatic hypertrophy) No date: CAD (coronary artery disease)     Comment:  s/p bypass graft x 4 (1991) No date: Diverticulosis No date: GERD (gastroesophageal reflux disease) No date: GI bleed No date: Glaucoma No date: Hypercholesterolemia No date: Hypertension No date: Pancreatitis     Comment:  x2 (1994 and 1998), presumed to be gallstone               pancreatitis.  s/p ERCP and sphincterotomy Past Surgical History: No date: APPENDECTOMY 1994: CHOLECYSTECTOMY No date: CORONARY ARTERY BYPASS GRAFT     Comment:  x 4,  1991 05/25/2019: EMBOLIZATION; Right     Comment:  Procedure: EMBOLIZATION (Right Colon);  Surgeon:               Katha Cabal, MD;  Location: [facility];               Service: Cardiovascular;  Laterality: Right; 2009: INGUINAL HERNIA REPAIR 1991:  open heart surgery BMI    Body Mass Index: 21.95 kg/m     Reproductive/Obstetrics negative OB ROS                             Anesthesia Physical Anesthesia Plan  ASA: IV  Anesthesia Plan: General   Post-op Pain Management:    Induction:   PONV Risk Score and Plan:   Airway Management Planned:   Additional Equipment:   Intra-op Plan:   Post-operative Plan:   Informed Consent: I have reviewed the patients History and Physical, chart, labs and discussed the procedure including the risks, benefits and alternatives for the proposed anesthesia with the patient or authorized representative who has indicated his/her understanding and acceptance.     Dental Advisory Given  Plan Discussed with: CRNA  Anesthesia Plan Comments:         Anesthesia Quick Evaluation

## 2019-07-14 NOTE — Plan of Care (Signed)

## 2019-07-14 NOTE — Discharge Summary (Signed)
Physician Discharge Summary  [name]: Tyler Stout MRN: [id number] DOB/AGE: [id number] 84 y.o.  Admit date: 07/12/2019 Discharge date: 07/14/2019  Admission Diagnoses:  Discharge Diagnoses:  Principal Problem:   Rectal bleeding Active Problems:   CAD (coronary artery disease)   Atrial fibrillation (HCC)   Chronic atrial fibrillation (HCC)   History of GI diverticular bleed   Rectal bleed   Discharged Condition: good  Hospital Course: Tyler Stout a 86 y.o.malewith medical history significant foriron deficiency anemia,history ofdiverticulosiswith recent diverticular bleed about a month ago,A. fib not on anticoagulation, CAD status post CABG, hypertension, BPH, hyperlipidemia, hard of hearingwho presents to the emergency room for evaluation of rectal bleeding which he statesinitially started as bright red blood per rectum but this morning had become maroon-colored. He has had multiple episodesover the last 24 hours. Patient was hospitalized about 6 weeks ago for a similar episode and at that time had microbead embolization of branches of the right colic artery for colonic diverticular bleed.He was discharged home in stable condition.  Patient is seen by gastroenterology.  Bleeding scan did not show any active bleeding.  Colonoscopy is performed on July 1.  Showed multiple diverticulosis without active bleeding.  Patient hemoglobin has been stable, he is medically stable to be discharged.   Consults: GI  Significant Diagnostic Studies:  NUCLEAR MEDICINE GASTROINTESTINAL BLEEDING SCAN  TECHNIQUE: Sequential abdominal images were obtained following intravenous administration of Tc-41m labeled red blood cells.  RADIOPHARMACEUTICALS:  24.46 mCi Tc-86m pertechnetate in-vitro labeled red cells.  COMPARISON:  May 25, 2019  FINDINGS: There is normal distribution of radiotracer on the provided images. Radiotracer is seen accumulating in the urinary  bladder. There is radiotracer projecting over the low midline pelvis which may be penile in location and is similar to the patient's prior study from May 25, 2019. There is no evidence for active GI bleed on this study.  IMPRESSION: No evidence for active GI bleeding.   Electronically Signed   By: Constance Holster M.D.   On: 07/12/2019 18:32    Treatments: EGD and colonoscopy  Discharge Exam: Blood pressure 127/63, pulse 73, temperature (!) 96.8 F (36 C), temperature source Tympanic, resp. rate 17, height 5\' 5"  (1.651 m), weight 59.8 kg, SpO2 100 %. General appearance: alert and cooperative Resp: clear to auscultation bilaterally Cardio: regular rate and rhythm, S1, S2 normal, no murmur, click, rub or gallop GI: soft, non-tender; bowel sounds normal; no masses,  no organomegaly Extremities: extremities normal, atraumatic, no cyanosis or edema  Disposition: Discharge disposition: 01-Home or Self Care       Discharge Instructions    Diet - low sodium heart healthy   Complete by: As directed    Increase activity slowly   Complete by: As directed      Allergies as of 07/14/2019      Reactions   Ilosone [erythromycin] Other (See Comments)   Reaction:  GI upset    Penicillins Rash, Other (See Comments)   Has patient had a PCN reaction causing immediate rash, facial/tongue/throat swelling, SOB or lightheadedness with hypotension: No Has patient had a PCN reaction causing severe rash involving mucus membranes or skin necrosis: No Has patient had a PCN reaction that required hospitalization No Has patient had a PCN reaction occurring within the last 10 years: No If all of the above answers are "NO", then may proceed with Cephalosporin use.      Medication List    TAKE these medications   albuterol 108 (90 Base)  MCG/ACT inhaler Commonly known as: VENTOLIN HFA Inhale 2 puffs into the lungs every 6 (six) hours as needed for wheezing or shortness of breath.    ascorbic acid 250 MG Chew Commonly known as: VITAMIN C Chew 250 mg by mouth daily.   aspirin EC 81 MG tablet Take 81 mg by mouth at bedtime.   atorvastatin 20 MG tablet Commonly known as: LIPITOR TAKE 1 TABLET (20 MG TOTAL) BY MOUTH DAILY. What changed: See the new instructions.   azelastine 0.1 % nasal spray Commonly known as: ASTELIN PLACE 1 SPRAY INTO BOTH NOSTRILS 2 (TWO) TIMES DAILY What changed: See the new instructions.   Breo Ellipta 200-25 MCG/INH Aepb Generic drug: fluticasone furoate-vilanterol Inhale 1 puff into the lungs daily.   carbidopa-levodopa 50-200 MG tablet Commonly known as: SINEMET CR Take 1 tablet by mouth at bedtime.   carbidopa-levodopa 25-100 MG tablet Commonly known as: SINEMET IR Take 2 tablets by mouth 3 (three) times daily.   citalopram 20 MG tablet Commonly known as: CELEXA TAKE 1 TABLET (20 MG TOTAL) BY MOUTH DAILY. What changed: See the new instructions.   fluticasone 50 MCG/ACT nasal spray Commonly known as: FLONASE SPRAY 2 SPRAYS INTO EACH NOSTRIL EVERY DAY What changed: See the new instructions.   furosemide 20 MG tablet Commonly known as: LASIX Take 20 mg by mouth 2 (two) times a week. (take on Monday and Thursday)   guaiFENesin 600 MG 12 hr tablet Commonly known as: MUCINEX Take 600 mg by mouth 2 (two) times daily.   metoprolol tartrate 25 MG tablet Commonly known as: LOPRESSOR Take 25 mg by mouth 2 (two) times daily.   multivitamin with minerals Tabs tablet Take 1 tablet by mouth every evening.   pantoprazole 40 MG tablet Commonly known as: PROTONIX TAKE 1 TABLET BY MOUTH EVERY DAY   potassium chloride 10 MEQ tablet Commonly known as: KLOR-CON Take one tablet by mouth with lasix. What changed:   how much to take  how to take this  when to take this  additional instructions   tamsulosin 0.4 MG Caps capsule Commonly known as: FLOMAX TAKE 1 CAPSULE BY MOUTH EVERY DAY   telmisartan 20 MG tablet Commonly  known as: MICARDIS Take 20 mg by mouth daily.   Vitamin D 50 MCG (2000 UT) tablet Take 2,000 Units by mouth every evening.   Xalatan 0.005 % ophthalmic solution Generic drug: latanoprost Place 1 drop into both eyes at bedtime.       Follow-up Information    [name], MD Follow up in 1 week(s).   Specialty: Internal Medicine Contact information: [address] [place] [address] [phone]               Signed: Sharen Hones 07/14/2019, 3:15 PM

## 2019-07-14 NOTE — Telephone Encounter (Signed)
Noted. Current admit. Will follow as appropriate post discharge.

## 2019-07-14 NOTE — Discharge Instructions (Signed)
1.  Follow-up with PCP in 1 week. 2.  Come back to the hospital if rectal bleeding recurs. 3.  High-fiber diet.

## 2019-07-14 NOTE — Transfer of Care (Signed)
Immediate Anesthesia Transfer of Care Note  Patient: Tyler Stout  Procedure(s) Performed: COLONOSCOPY (N/A )  Patient Location: Endoscopy Unit  Anesthesia Type:General  Level of Consciousness: drowsy  Airway & Oxygen Therapy: Patient Spontanous Breathing  Post-op Assessment: Report given to RN and Post -op Vital signs reviewed and stable  Post vital signs: Reviewed and stable  Last Vitals:  Vitals Value Taken Time  BP 96/43 07/14/19 1241  Temp    Pulse 82 07/14/19 1241  Resp 14 07/14/19 1241  SpO2 100 % 07/14/19 1241  Vitals shown include unvalidated device data.  Last Pain:  Vitals:   07/14/19 1124  TempSrc: Temporal  PainSc: 0-No pain         Complications: No complications documented.

## 2019-07-15 ENCOUNTER — Telehealth: Payer: Self-pay

## 2019-07-15 ENCOUNTER — Encounter: Payer: Self-pay | Admitting: Gastroenterology

## 2019-07-15 NOTE — Telephone Encounter (Signed)
Unsuccessful attempt to reach patient for transition of care. No answer. Left message stating okay to return call to office and/or I will call again. Keep hospital follow up appointment scheduled 07/22/19 @ 1:30 with PMD. Will follow as appropriate.

## 2019-07-19 NOTE — Telephone Encounter (Signed)
Transition Care Management Follow-up Telephone Call  Date of discharge and from where: 07/14/19 from [facility]  How have you been since you were released from the hospital? Information received from wife, HIPAA compliant. He has been doing good, no problems, no bleeding. R leg was swollen yesterday, but after taking lasix his leg is at baseline. No swelling today. Walking up/down driveway for a little exercise and was happy to get a haircut.  Eating a lot of salad, drinking plenty of water and 2 BMs yesterday." Confirms input/output appropriate. Denies chest pain, abd pain, headache, dizziness, n/v/d, blood in stool and all other symptoms.  Any questions or concerns? No   Items Reviewed:  Did the pt receive and understand the discharge instructions provided? Yes , increase activity slowly.   Medications obtained and verified? Yes , wife and daughter manage.   Any new allergies since your discharge? No   Dietary orders reviewed? Yes, heart healthy, low sodium  Do you have support at home? Yes wife and daughter. Daughter now lives next door.   Functional Questionnaire: (I = Independent and D = Dependent) ADLs: i  Bathing/Dressing- i  Meal Prep- i  Eating- i  Maintaining continence- i  Transferring/Ambulation- d- walker  Managing Meds- d- wife, daughter to manage  Follow up appointments reviewed:   PCP Hospital f/u appt confirmed? Yes  Scheduled to see Dr. Nicki Reaper on 07/22/19 @ 1:30.  Are transportation arrangements needed? No   If their condition worsens, is the pt aware to call PCP or go to the Emergency Dept.? Yes  Was the patient provided with contact information for the PCP's office or ED? Yes  Was to pt encouraged to call back with questions or concerns? Yes

## 2019-07-21 NOTE — Anesthesia Postprocedure Evaluation (Signed)
Anesthesia Post Note  Patient: Tyler Stout  Procedure(s) Performed: COLONOSCOPY (N/A )  Patient location during evaluation: PACU Anesthesia Type: General Level of consciousness: awake and alert Pain management: pain level controlled Vital Signs Assessment: post-procedure vital signs reviewed and stable Respiratory status: spontaneous breathing, nonlabored ventilation, respiratory function stable and patient connected to nasal cannula oxygen Cardiovascular status: blood pressure returned to baseline and stable Postop Assessment: no apparent nausea or vomiting Anesthetic complications: no   No complications documented.   Last Vitals:  Vitals:   07/14/19 1310 07/14/19 1545  BP: 127/63 124/80  Pulse: 73 77  Resp: 17 18  Temp:  36.6 C  SpO2: 100% 100%    Last Pain:  Vitals:   07/14/19 1545  TempSrc: Oral  PainSc:                  Molli Barrows

## 2019-07-22 ENCOUNTER — Other Ambulatory Visit: Payer: Self-pay

## 2019-07-22 ENCOUNTER — Ambulatory Visit (INDEPENDENT_AMBULATORY_CARE_PROVIDER_SITE_OTHER): Payer: Medicare HMO | Admitting: Internal Medicine

## 2019-07-22 VITALS — BP 122/72 | HR 65 | Temp 98.2°F | Resp 16 | Ht 65.0 in | Wt 134.2 lb

## 2019-07-22 DIAGNOSIS — G231 Progressive supranuclear ophthalmoplegia [Steele-Richardson-Olszewski]: Secondary | ICD-10-CM

## 2019-07-22 DIAGNOSIS — R600 Localized edema: Secondary | ICD-10-CM

## 2019-07-22 DIAGNOSIS — I429 Cardiomyopathy, unspecified: Secondary | ICD-10-CM

## 2019-07-22 DIAGNOSIS — D508 Other iron deficiency anemias: Secondary | ICD-10-CM | POA: Diagnosis not present

## 2019-07-22 DIAGNOSIS — I482 Chronic atrial fibrillation, unspecified: Secondary | ICD-10-CM

## 2019-07-22 DIAGNOSIS — I700 Atherosclerosis of aorta: Secondary | ICD-10-CM | POA: Diagnosis not present

## 2019-07-22 DIAGNOSIS — I10 Essential (primary) hypertension: Secondary | ICD-10-CM

## 2019-07-22 DIAGNOSIS — E7800 Pure hypercholesterolemia, unspecified: Secondary | ICD-10-CM

## 2019-07-22 LAB — CBC WITH DIFFERENTIAL/PLATELET
Basophils Absolute: 0 10*3/uL (ref 0.0–0.1)
Basophils Relative: 0.9 % (ref 0.0–3.0)
Eosinophils Absolute: 0 10*3/uL (ref 0.0–0.7)
Eosinophils Relative: 0.6 % (ref 0.0–5.0)
HCT: 31.2 % — ABNORMAL LOW (ref 39.0–52.0)
Hemoglobin: 10.4 g/dL — ABNORMAL LOW (ref 13.0–17.0)
Lymphocytes Relative: 17.6 % (ref 12.0–46.0)
Lymphs Abs: 0.8 10*3/uL (ref 0.7–4.0)
MCHC: 33.4 g/dL (ref 30.0–36.0)
MCV: 96.7 fl (ref 78.0–100.0)
Monocytes Absolute: 0.6 10*3/uL (ref 0.1–1.0)
Monocytes Relative: 12.6 % — ABNORMAL HIGH (ref 3.0–12.0)
Neutro Abs: 3.2 10*3/uL (ref 1.4–7.7)
Neutrophils Relative %: 68.3 % (ref 43.0–77.0)
Platelets: 196 10*3/uL (ref 150.0–400.0)
RBC: 3.22 Mil/uL — ABNORMAL LOW (ref 4.22–5.81)
RDW: 16 % — ABNORMAL HIGH (ref 11.5–15.5)
WBC: 4.7 10*3/uL (ref 4.0–10.5)

## 2019-07-22 LAB — IBC + FERRITIN
Ferritin: 109.6 ng/mL (ref 22.0–322.0)
Iron: 46 ug/dL (ref 42–165)
Saturation Ratios: 14.8 % — ABNORMAL LOW (ref 20.0–50.0)
Transferrin: 222 mg/dL (ref 212.0–360.0)

## 2019-07-22 NOTE — Progress Notes (Signed)
[name]: JOESEPH VERVILLE, male   DOB: [birth year], 86 y.o.   MRN: [id number]   Subjective:    [name]: JUPITER KABIR, male    DOB: Mar 17, 1925, 86 y.o.   MRN: [id number]  HPI This visit occurred during the SARS-CoV-2 public health emergency.  Safety protocols were in place, including screening questions prior to the visit, additional usage of staff PPE, and extensive cleaning of exam room while observing appropriate contact time as indicated for disinfecting solutions.  Patient here for a hospital follow up.  He is accompanied by his wife.  History obtained from both of them.  Admitted  07/12/19- 07/14/19 with rectal bleeding.  Had multiple episodes of bleeding prior to ER evaluation.  Evaluated by GI.  Bleeding scan did not show any active bleeding.  Colonoscopy 07/14/19 - multiple diverticulosis without active bleeding.  hgb stable.  Discharged.  States since being home he has not noticed any further bleeding.  Eating.  No nausea or vomiting.  No abdominal pain.  Bowels are moving.  Wife reports they are trying to walk daily.  Breathing stable.  Some dizziness.  Discussed.  Chronic issue.    Past Medical History:  Diagnosis Date  . Anemia    iron deficiency  . Arthritis   . Atrial fibrillation ([place])   . Bilateral renal cysts    worked up by Dr Bernardo Heater  . BPH (benign prostatic hypertrophy)   . CAD (coronary artery disease)    s/p bypass graft x 4 (1991)  . Diverticulosis   . GERD (gastroesophageal reflux disease)   . GI bleed   . Glaucoma   . Hypercholesterolemia   . Hypertension   . Pancreatitis    x2 (1994 and 1998), presumed to be gallstone pancreatitis.  s/p ERCP and sphincterotomy   Past Surgical History:  Procedure Laterality Date  . APPENDECTOMY    . CHOLECYSTECTOMY  1994  . COLONOSCOPY N/A 07/14/2019   Procedure: COLONOSCOPY;  Surgeon: Virgel Manifold, MD;  Location: [facility] ENDOSCOPY;  Service: Endoscopy;  Laterality: N/A;  . CORONARY ARTERY BYPASS GRAFT     x 4,  1991    . EMBOLIZATION Right 05/25/2019   Procedure: EMBOLIZATION (Right Colon);  Surgeon: Katha Cabal, MD;  Location: [facility];  Service: Cardiovascular;  Laterality: Right;  . INGUINAL HERNIA REPAIR  2009  . open heart surgery  1991   Family History  Problem Relation Age of Onset  . Throat cancer Father        oropharyngeal cancer  . Kidney disease Sister   . Heart disease Other   . Colon cancer Neg Hx   . Prostate cancer Neg Hx    Social History   Socioeconomic History  . Marital status: Married    Spouse name: Not on file  . Number of children: Not on file  . Years of education: Not on file  . Highest education level: Not on file  Occupational History  . Not on file  Tobacco Use  . Smoking status: Former Smoker    Packs/day: 2.00    Years: 25.00    Pack years: 50.00    Types: Cigarettes    Quit date: 01/14/1964    Years since quitting: 55.5  . Smokeless tobacco: Never Used  . Tobacco comment: smoked 2 packs/day, 1 pipe and 1 cigar once in a while.  Substance and Sexual Activity  . Alcohol use: No    Alcohol/week: 0.0 standard drinks  . Drug use:  No  . Sexual activity: Not on file  Other Topics Concern  . Not on file  Social History Narrative  . Not on file   Social Determinants of Health   Financial Resource Strain:   . Difficulty of Paying Living Expenses:   Food Insecurity:   . Worried About Charity fundraiser in the Last Year:   . Arboriculturist in the Last Year:   Transportation Needs:   . Film/video editor (Medical):   Marland Kitchen Lack of Transportation (Non-Medical):   Physical Activity:   . Days of Exercise per Week:   . Minutes of Exercise per Session:   Stress:   . Feeling of Stress :   Social Connections:   . Frequency of Communication with Friends and Family:   . Frequency of Social Gatherings with Friends and Family:   . Attends Religious Services:   . Active Member of Clubs or Organizations:   . Attends Archivist  Meetings:   Marland Kitchen Marital Status:     Outpatient Encounter Medications as of 07/22/2019  Medication Sig  . albuterol (VENTOLIN HFA) 108 (90 Base) MCG/ACT inhaler Inhale 2 puffs into the lungs every 6 (six) hours as needed for wheezing or shortness of breath.  Marland Kitchen ascorbic acid (VITAMIN C) 250 MG CHEW Chew 250 mg by mouth daily.  Marland Kitchen aspirin EC 81 MG tablet Take 81 mg by mouth at bedtime.   Marland Kitchen atorvastatin (LIPITOR) 20 MG tablet TAKE 1 TABLET (20 MG TOTAL) BY MOUTH DAILY. (Patient taking differently: Take 20 mg by mouth every evening. )  . azelastine (ASTELIN) 0.1 % nasal spray PLACE 1 SPRAY INTO BOTH NOSTRILS 2 (TWO) TIMES DAILY (Patient taking differently: Place 1 spray into both nostrils 2 (two) times daily as needed for rhinitis or allergies. )  . BREO ELLIPTA 200-25 MCG/INH AEPB Inhale 1 puff into the lungs daily.  . carbidopa-levodopa (SINEMET CR) 50-200 MG tablet Take 1 tablet by mouth at bedtime.  . carbidopa-levodopa (SINEMET IR) 25-100 MG tablet Take 2 tablets by mouth 3 (three) times daily.  . Cholecalciferol (VITAMIN D) 50 MCG (2000 UT) tablet Take 2,000 Units by mouth every evening.   . citalopram (CELEXA) 20 MG tablet TAKE 1 TABLET (20 MG TOTAL) BY MOUTH DAILY. (Patient taking differently: Take 20 mg by mouth daily. )  . fluticasone (FLONASE) 50 MCG/ACT nasal spray SPRAY 2 SPRAYS INTO EACH NOSTRIL EVERY DAY (Patient taking differently: Place 2 sprays into both nostrils daily as needed for allergies or rhinitis. )  . furosemide (LASIX) 20 MG tablet Take 20 mg by mouth 2 (two) times a week. (take on Monday and Thursday)  . guaiFENesin (MUCINEX) 600 MG 12 hr tablet Take 600 mg by mouth 2 (two) times daily.  Marland Kitchen latanoprost (XALATAN) 0.005 % ophthalmic solution Place 1 drop into both eyes at bedtime.   . metoprolol tartrate (LOPRESSOR) 25 MG tablet Take 25 mg by mouth 2 (two) times daily.   . Multiple Vitamin (MULTIVITAMIN WITH MINERALS) TABS tablet Take 1 tablet by mouth every evening.   .  pantoprazole (PROTONIX) 40 MG tablet TAKE 1 TABLET BY MOUTH EVERY DAY (Patient taking differently: Take 40 mg by mouth daily. )  . potassium chloride (KLOR-CON) 10 MEQ tablet Take one tablet by mouth with lasix. (Patient taking differently: Take 10 mEq by mouth 2 (two) times a week. (take with furosemide))  . tamsulosin (FLOMAX) 0.4 MG CAPS capsule TAKE 1 CAPSULE BY MOUTH EVERY DAY (Patient taking differently:  Take 0.4 mg by mouth daily. )  . telmisartan (MICARDIS) 20 MG tablet Take 20 mg by mouth daily.   No facility-administered encounter medications on file as of 07/22/2019.    Review of Systems  Constitutional: Negative for appetite change and unexpected weight change.  HENT: Negative for congestion and sinus pressure.   Respiratory: Negative for cough and chest tightness.        Breathing stable.    Cardiovascular: Negative for chest pain and palpitations.  Gastrointestinal: Negative for abdominal pain, diarrhea, nausea and vomiting.       No further bleeding since discharge.    Genitourinary: Negative for difficulty urinating and dysuria.  Musculoskeletal: Negative for joint swelling and myalgias.  Skin: Negative for color change and rash.  Neurological: Positive for dizziness. Negative for headaches.  Psychiatric/Behavioral: Negative for agitation and dysphoric mood.        Objective:    Physical Exam Vitals reviewed.  Constitutional:      General: He is not in acute distress.    Appearance: Normal appearance. He is well-developed.  HENT:     Head: Normocephalic and atraumatic.     Right Ear: External ear normal.     Left Ear: External ear normal.  Eyes:     General: No scleral icterus.       Right eye: No discharge.        Left eye: No discharge.     Conjunctiva/sclera: Conjunctivae normal.  Cardiovascular:     Rate and Rhythm: Normal rate.     Comments: Rate controlled.   Pulmonary:     Effort: Pulmonary effort is normal. No respiratory distress.     Breath  sounds: Normal breath sounds.  Abdominal:     General: Bowel sounds are normal.     Palpations: Abdomen is soft.     Tenderness: There is no abdominal tenderness.  Musculoskeletal:        General: No tenderness.     Cervical back: Neck supple. No tenderness.     Comments: Compression hose in place.  No increased swelling.   Lymphadenopathy:     Cervical: No cervical adenopathy.  Skin:    Findings: No erythema or rash.  Neurological:     Mental Status: He is alert.  Psychiatric:        Mood and Affect: Mood normal.        Behavior: Behavior normal.     BP 122/72   Pulse 65   Temp 98.2 F (36.8 C)   Resp 16   Ht 5\' 5"  (1.651 m)   Wt 134 lb 3.2 oz (60.9 kg)   SpO2 99%   BMI 22.33 kg/m  Wt Readings from Last 3 Encounters:  07/22/19 134 lb 3.2 oz (60.9 kg)  07/14/19 131 lb 14.4 oz (59.8 kg)  06/03/19 136 lb 12.8 oz (62.1 kg)     Lab Results  Component Value Date   WBC 4.7 07/22/2019   HGB 10.4 (L) 07/22/2019   HCT 31.2 (L) 07/22/2019   PLT 196.0 07/22/2019   GLUCOSE 94 07/14/2019   CHOL 116 04/28/2019   TRIG 82.0 04/28/2019   HDL 47.90 04/28/2019   LDLCALC 51 04/28/2019   ALT <5 07/12/2019   AST 22 07/12/2019   NA 135 07/14/2019   K 4.0 07/14/2019   CL 102 07/14/2019   CREATININE 0.74 07/14/2019   BUN 10 07/14/2019   CO2 25 07/14/2019   TSH 1.06 04/28/2019   INR 1.2 05/25/2019   HGBA1C  6.3 04/28/2019    NM GI Blood Loss  Result Date: 07/12/2019 CLINICAL DATA:  Lower GI bleeding. EXAM: NUCLEAR MEDICINE GASTROINTESTINAL BLEEDING SCAN TECHNIQUE: Sequential abdominal images were obtained following intravenous administration of Tc-40m labeled red blood cells. RADIOPHARMACEUTICALS:  24.46 mCi Tc-40m pertechnetate in-vitro labeled red cells. COMPARISON:  May 25, 2019 FINDINGS: There is normal distribution of radiotracer on the provided images. Radiotracer is seen accumulating in the urinary bladder. There is radiotracer projecting over the low midline pelvis which  may be penile in location and is similar to the patient's prior study from May 25, 2019. There is no evidence for active GI bleed on this study. IMPRESSION: No evidence for active GI bleeding. Electronically Signed   By: Constance Holster M.D.   On: 07/12/2019 18:32       Assessment & Plan:   Problem List Items Addressed This Visit    Anemia - Primary   Relevant Orders   CBC with Differential/Platelet (Completed)   IBC + Ferritin (Completed)   Cardiomyopathy ([place])    No evidence of volume overload on exam today.  Continues on lasix prn.  Continue micardis and metoprolol.        Chronic atrial fibrillation (HCC)    Off coumadin secondary to GI bleeds.  On metoprolol.  Rate controlled.        Hypercholesteremia    On lipitor.  Follow lipid panel and liver function tests.        Hypertension    Blood pressure doing well.  On lopressor and micardis.  Takes lasix prn.  Follow.        Lower extremity edema    Stable.  Wearing compression hose.       Progressive supranuclear palsy ([place])    Followed by neurology.  Stable.       Thoracic aortic atherosclerosis (HCC)    Continue lipitor.  Is off aspirin due to recurring bleeds.            [name], MD

## 2019-07-23 ENCOUNTER — Encounter: Payer: Self-pay | Admitting: Internal Medicine

## 2019-07-23 DIAGNOSIS — I429 Cardiomyopathy, unspecified: Secondary | ICD-10-CM | POA: Insufficient documentation

## 2019-07-23 NOTE — Assessment & Plan Note (Signed)
Blood pressure doing well.  On lopressor and micardis.  Takes lasix prn.  Follow.

## 2019-07-23 NOTE — Assessment & Plan Note (Signed)
Off coumadin secondary to GI bleeds.  On metoprolol.  Rate controlled.

## 2019-07-23 NOTE — Assessment & Plan Note (Signed)
On lipitor.  Follow lipid panel and liver function tests.   

## 2019-07-23 NOTE — Assessment & Plan Note (Signed)
No evidence of volume overload on exam today.  Continues on lasix prn.  Continue micardis and metoprolol.

## 2019-07-23 NOTE — Assessment & Plan Note (Signed)
Continue lipitor.  Is off aspirin due to recurring bleeds.

## 2019-07-23 NOTE — Assessment & Plan Note (Signed)
Stable.  Wearing compression hose.

## 2019-07-23 NOTE — Assessment & Plan Note (Signed)
Followed by neurology.  Stable  

## 2019-07-27 ENCOUNTER — Telehealth: Payer: Self-pay | Admitting: Internal Medicine

## 2019-07-27 NOTE — Telephone Encounter (Signed)
Pt's daughter asked to speak to you about pt's last office visit. Please advise

## 2019-08-01 NOTE — Telephone Encounter (Signed)
Called and left message for daughter to call me back

## 2019-08-03 NOTE — Telephone Encounter (Signed)
LM for daughter

## 2019-08-05 NOTE — Telephone Encounter (Signed)
Pt's daughter called in stated that someone called and said that they need to  Make appointment did not know what the appointment is for please advise Olivia Mackie said call her [phone]

## 2019-08-05 NOTE — Telephone Encounter (Signed)
Dr. Bethanne Ginger office is the one who called to schedule appt. Tyler Stout is going to call them. Daughter was asking while on the phone if you would be agreeable to write a letter of medical necessity for him to have a cargo lift [place]. The lady that she spoke with about having one installed stated that if they had the letter, they qualify to have the lift installed at a discounted price. House is [place] stilts and pt and his wife are unable to carry luggage up the stairs anymore.

## 2019-08-06 NOTE — Telephone Encounter (Signed)
Letter typed

## 2019-08-09 NOTE — Telephone Encounter (Signed)
Letter emailed to grand daughter per daughters request.

## 2019-08-09 NOTE — Telephone Encounter (Signed)
Left detailed message for daughter

## 2019-08-09 NOTE — Telephone Encounter (Signed)
Letter printed for signature

## 2019-08-16 ENCOUNTER — Telehealth: Payer: Self-pay | Admitting: Internal Medicine

## 2019-08-16 NOTE — Telephone Encounter (Signed)
Pt's wife fail 2 week ago he still knot on his right leg

## 2019-08-17 NOTE — Telephone Encounter (Signed)
Called daughter to clarify message below. Wife did not fall. Patient fell 2 weeks ago and bumped his knee. Bruising is better but there is still a small knot. No open areas, swelling, etc. He is still able to bear weight, ambulate, etc as he normally would. Patient is not really complaining of any pain. Rachelle Hora with ortho looked at pictures of his knee and told them that it looked ok and to continue to monitor. Pt has appt on 8/16 withPCP. Will look at it then. Advised daughter if does not continue to get better or starts to get worse, let us know before then.

## 2019-08-29 ENCOUNTER — Encounter: Payer: Self-pay | Admitting: Internal Medicine

## 2019-08-29 ENCOUNTER — Other Ambulatory Visit: Payer: Self-pay

## 2019-08-29 ENCOUNTER — Ambulatory Visit (INDEPENDENT_AMBULATORY_CARE_PROVIDER_SITE_OTHER): Payer: Medicare HMO | Admitting: Internal Medicine

## 2019-08-29 DIAGNOSIS — Z8719 Personal history of other diseases of the digestive system: Secondary | ICD-10-CM

## 2019-08-29 DIAGNOSIS — J411 Mucopurulent chronic bronchitis: Secondary | ICD-10-CM

## 2019-08-29 DIAGNOSIS — I10 Essential (primary) hypertension: Secondary | ICD-10-CM

## 2019-08-29 DIAGNOSIS — I482 Chronic atrial fibrillation, unspecified: Secondary | ICD-10-CM

## 2019-08-29 DIAGNOSIS — I700 Atherosclerosis of aorta: Secondary | ICD-10-CM | POA: Diagnosis not present

## 2019-08-29 DIAGNOSIS — R634 Abnormal weight loss: Secondary | ICD-10-CM | POA: Diagnosis not present

## 2019-08-29 DIAGNOSIS — D508 Other iron deficiency anemias: Secondary | ICD-10-CM

## 2019-08-29 DIAGNOSIS — I429 Cardiomyopathy, unspecified: Secondary | ICD-10-CM

## 2019-08-29 DIAGNOSIS — R739 Hyperglycemia, unspecified: Secondary | ICD-10-CM

## 2019-08-29 DIAGNOSIS — G231 Progressive supranuclear ophthalmoplegia [Steele-Richardson-Olszewski]: Secondary | ICD-10-CM | POA: Diagnosis not present

## 2019-08-29 DIAGNOSIS — E7800 Pure hypercholesterolemia, unspecified: Secondary | ICD-10-CM

## 2019-08-29 NOTE — Progress Notes (Signed)
[name]: Tyler Stout, male   DOB: [birth year], 86 y.o.   MRN: [id number]   Subjective:    [name]: Tyler Stout, male    DOB: December 07, 1925, 86 y.o.   MRN: [id number]  HPI This visit occurred during the SARS-CoV-2 public health emergency.  Safety protocols were in place, including screening questions prior to the visit, additional usage of staff PPE, and extensive cleaning of exam room while observing appropriate contact time as indicated for disinfecting solutions.  Patient here for a scheduled follow up.  He is accompanied by his daughter.  History obtained from both of them.  He is doing relatively well.  Walks with his walker.  No chest pain or increased sob with increased activity.  No abdominal pain reported.  Bowels moving.  No blood.  Blood pressure doing well.  Taking lasix two times per week.  This is controlling hid volume status.  No evidence of volume overload.  Weight is down.  Did fall one month ago.  Was leaning over and kept going forward.  Also, three weeks ago - fell over his walker.  Has a knot on his leg - residual from the fall.  Is better.  No tenderness now.  No head injury.  Saw Dr Ubaldo Glassing 08/24/19.  ECHO 45-50%.  Currently stable.  Daughter reports he is taking lasix 2x/day.  Follow.     Past Medical History:  Diagnosis Date  . Anemia    iron deficiency  . Arthritis   . Atrial fibrillation ([place])   . Bilateral renal cysts    worked up by Dr Bernardo Heater  . BPH (benign prostatic hypertrophy)   . CAD (coronary artery disease)    s/p bypass graft x 4 (1991)  . Diverticulosis   . GERD (gastroesophageal reflux disease)   . GI bleed   . Glaucoma   . Hypercholesterolemia   . Hypertension   . Pancreatitis    x2 (1994 and 1998), presumed to be gallstone pancreatitis.  s/p ERCP and sphincterotomy   Past Surgical History:  Procedure Laterality Date  . APPENDECTOMY    . CHOLECYSTECTOMY  1994  . COLONOSCOPY N/A 07/14/2019   Procedure: COLONOSCOPY;  Surgeon: Virgel Manifold, MD;  Location: [facility];  Service: Endoscopy;  Laterality: N/A;  . CORONARY ARTERY BYPASS GRAFT     x 4,  1991  . EMBOLIZATION Right 05/25/2019   Procedure: EMBOLIZATION (Right Colon);  Surgeon: Katha Cabal, MD;  Location: [facility];  Service: Cardiovascular;  Laterality: Right;  . INGUINAL HERNIA REPAIR  2009  . open heart surgery  1991   Family History  Problem Relation Age of Onset  . Throat cancer Father        oropharyngeal cancer  . Kidney disease Sister   . Heart disease Other   . Colon cancer Neg Hx   . Prostate cancer Neg Hx    Social History   Socioeconomic History  . Marital status: Married    Spouse name: Not on file  . Number of children: Not on file  . Years of education: Not on file  . Highest education level: Not on file  Occupational History  . Not on file  Tobacco Use  . Smoking status: Former Smoker    Packs/day: 2.00    Years: 25.00    Pack years: 50.00    Types: Cigarettes    Quit date: 01/14/1964    Years since quitting: 55.6  . Smokeless tobacco: Never Used  .  Tobacco comment: smoked 2 packs/day, 1 pipe and 1 cigar once in a while.  Substance and Sexual Activity  . Alcohol use: No    Alcohol/week: 0.0 standard drinks  . Drug use: No  . Sexual activity: Not on file  Other Topics Concern  . Not on file  Social History Narrative  . Not on file   Social Determinants of Health   Financial Resource Strain:   . Difficulty of Paying Living Expenses: Not on file  Food Insecurity:   . Worried About Charity fundraiser in the Last Year: Not on file  . Ran Out of Food in the Last Year: Not on file  Transportation Needs:   . Lack of Transportation (Medical): Not on file  . Lack of Transportation (Non-Medical): Not on file  Physical Activity:   . Days of Exercise per Week: Not on file  . Minutes of Exercise per Session: Not on file  Stress:   . Feeling of Stress : Not on file  Social Connections:   . Frequency of  Communication with Friends and Family: Not on file  . Frequency of Social Gatherings with Friends and Family: Not on file  . Attends Religious Services: Not on file  . Active Member of Clubs or Organizations: Not on file  . Attends Archivist Meetings: Not on file  . Marital Status: Not on file    Outpatient Encounter Medications as of 08/29/2019  Medication Sig  . albuterol (VENTOLIN HFA) 108 (90 Base) MCG/ACT inhaler Inhale 2 puffs into the lungs every 6 (six) hours as needed for wheezing or shortness of breath.  Marland Kitchen ascorbic acid (VITAMIN C) 250 MG CHEW Chew 250 mg by mouth daily.  Marland Kitchen aspirin EC 81 MG tablet Take 81 mg by mouth at bedtime.   Marland Kitchen atorvastatin (LIPITOR) 20 MG tablet TAKE 1 TABLET (20 MG TOTAL) BY MOUTH DAILY. (Patient taking differently: Take 20 mg by mouth every evening. )  . azelastine (ASTELIN) 0.1 % nasal spray PLACE 1 SPRAY INTO BOTH NOSTRILS 2 (TWO) TIMES DAILY (Patient taking differently: Place 1 spray into both nostrils 2 (two) times daily as needed for rhinitis or allergies. )  . BREO ELLIPTA 200-25 MCG/INH AEPB Inhale 1 puff into the lungs daily.  . carbidopa-levodopa (SINEMET CR) 50-200 MG tablet Take 1 tablet by mouth at bedtime.  . carbidopa-levodopa (SINEMET IR) 25-100 MG tablet Take 2 tablets by mouth 3 (three) times daily.  . Cholecalciferol (VITAMIN D) 50 MCG (2000 UT) tablet Take 2,000 Units by mouth every evening.   . citalopram (CELEXA) 20 MG tablet TAKE 1 TABLET (20 MG TOTAL) BY MOUTH DAILY. (Patient taking differently: Take 20 mg by mouth daily. )  . fluticasone (FLONASE) 50 MCG/ACT nasal spray SPRAY 2 SPRAYS INTO EACH NOSTRIL EVERY DAY (Patient taking differently: Place 2 sprays into both nostrils daily as needed for allergies or rhinitis. )  . furosemide (LASIX) 20 MG tablet Take 20 mg by mouth 2 (two) times a week. (take on Monday and Thursday)  . guaiFENesin (MUCINEX) 600 MG 12 hr tablet Take 600 mg by mouth 2 (two) times daily.  Marland Kitchen latanoprost  (XALATAN) 0.005 % ophthalmic solution Place 1 drop into both eyes at bedtime.   . metoprolol tartrate (LOPRESSOR) 25 MG tablet Take 25 mg by mouth 2 (two) times daily.   . Multiple Vitamin (MULTIVITAMIN WITH MINERALS) TABS tablet Take 1 tablet by mouth every evening.   . pantoprazole (PROTONIX) 40 MG tablet TAKE 1 TABLET  BY MOUTH EVERY DAY (Patient taking differently: Take 40 mg by mouth daily. )  . potassium chloride (KLOR-CON) 10 MEQ tablet Take one tablet by mouth with lasix. (Patient taking differently: Take 10 mEq by mouth 2 (two) times a week. (take with furosemide))  . tamsulosin (FLOMAX) 0.4 MG CAPS capsule TAKE 1 CAPSULE BY MOUTH EVERY DAY (Patient taking differently: Take 0.4 mg by mouth daily. )  . telmisartan (MICARDIS) 20 MG tablet Take 20 mg by mouth daily.   No facility-administered encounter medications on file as of 08/29/2019.    Review of Systems  Constitutional:       Eating.  Weight is down.   HENT: Negative for congestion and sinus pressure.   Respiratory: Negative for chest tightness.        Breathing stable.  Some chronic cough.   Cardiovascular: Negative for chest pain, palpitations and leg swelling.  Gastrointestinal: Negative for abdominal pain, diarrhea, nausea and vomiting.  Genitourinary: Negative for difficulty urinating and dysuria.  Musculoskeletal: Negative for joint swelling and myalgias.  Skin: Negative for color change and rash.  Neurological: Negative for syncope and headaches.  Psychiatric/Behavioral: Negative for agitation and dysphoric mood.       Objective:    Physical Exam Vitals reviewed.  Constitutional:      General: He is not in acute distress.    Appearance: Normal appearance. He is well-developed.  HENT:     Head: Normocephalic and atraumatic.     Right Ear: External ear normal.     Left Ear: External ear normal.  Eyes:     General: No scleral icterus.       Right eye: No discharge.        Left eye: No discharge.      Conjunctiva/sclera: Conjunctivae normal.  Cardiovascular:     Rate and Rhythm: Normal rate.     Comments: Rate controlled.   Pulmonary:     Effort: Pulmonary effort is normal. No respiratory distress.     Breath sounds: Normal breath sounds.  Abdominal:     General: Bowel sounds are normal.     Palpations: Abdomen is soft.     Tenderness: There is no abdominal tenderness.  Musculoskeletal:        General: No swelling or tenderness.     Cervical back: Neck supple. No tenderness.  Lymphadenopathy:     Cervical: No cervical adenopathy.  Skin:    Findings: No erythema or rash.     Comments: Raised hematoma - lower leg.  Non tender.  No surrounding erythema.  Daughter reports improved.    Neurological:     Mental Status: He is alert.  Psychiatric:        Mood and Affect: Mood normal.        Behavior: Behavior normal.     BP 102/62   Pulse (!) 58   Temp (!) 97.4 F (36.3 C) (Oral)   Resp 16   Ht '5\' 5"'  (1.651 m)   Wt 128 lb 6.4 oz (58.2 kg)   SpO2 99%   BMI 21.37 kg/m  Wt Readings from Last 3 Encounters:  08/29/19 128 lb 6.4 oz (58.2 kg)  07/22/19 134 lb 3.2 oz (60.9 kg)  07/14/19 131 lb 14.4 oz (59.8 kg)     Lab Results  Component Value Date   WBC 4.7 07/22/2019   HGB 10.4 (L) 07/22/2019   HCT 31.2 (L) 07/22/2019   PLT 196.0 07/22/2019   GLUCOSE 94 07/14/2019   CHOL 116  04/28/2019   TRIG 82.0 04/28/2019   HDL 47.90 04/28/2019   LDLCALC 51 04/28/2019   ALT <5 07/12/2019   AST 22 07/12/2019   NA 135 07/14/2019   K 4.0 07/14/2019   CL 102 07/14/2019   CREATININE 0.74 07/14/2019   BUN 10 07/14/2019   CO2 25 07/14/2019   TSH 1.06 04/28/2019   INR 1.2 05/25/2019   HGBA1C 6.3 04/28/2019    NM GI Blood Loss  Result Date: 07/12/2019 CLINICAL DATA:  Lower GI bleeding. EXAM: NUCLEAR MEDICINE GASTROINTESTINAL BLEEDING SCAN TECHNIQUE: Sequential abdominal images were obtained following intravenous administration of Tc-59mlabeled red blood cells.  RADIOPHARMACEUTICALS:  24.46 mCi Tc-93mertechnetate in-vitro labeled red cells. COMPARISON:  May 25, 2019 FINDINGS: There is normal distribution of radiotracer on the provided images. Radiotracer is seen accumulating in the urinary bladder. There is radiotracer projecting over the low midline pelvis which may be penile in location and is similar to the patient's prior study from May 25, 2019. There is no evidence for active GI bleed on this study. IMPRESSION: No evidence for active GI bleeding. Electronically Signed   By: ChConstance Holster.D.   On: 07/12/2019 18:32       Assessment & Plan:   Problem List Items Addressed This Visit    Thoracic aortic atherosclerosis ([facility]   Continue lipitor.  Off aspirin due to recurring bleeds.  Follow.       Progressive supranuclear palsy ([facility]   Followed by neurology.  Stable.       Loss of weight    Decreased weight.  Reports is eating.  Follow.        Hypertension    Blood pressure doing well.  On micardis, metoprolol and taking lasix 2x/week.  Follow pressures.  Follow metabolic panel.       Hyperglycemia    Follow met b and a1c.       Hypercholesteremia    On lipitor.  Follow lipid panel and liver function tests.        History of GI diverticular bleed    No further bleeding since his last hospitalization.  Follow.        Chronic bronchitis ([facility]   Has seen pulmonary.  Diagnosed with chronic bronchitis.  Breathing stable.        Chronic atrial fibrillation ([facility])    Off coumadin secondary to GI bleeds.  Stable.  Follow. Continue metoprolol.  Rated controlled.       Cardiomyopathy ([facility]   EF 45-50%.  No evidence of volume overload.  Continue micardis, metoprolol and lasix 2x/week.  Stable.  Follow.       Anemia    Follow cbc.           ChEinar PheasantMD

## 2019-09-05 ENCOUNTER — Encounter: Payer: Self-pay | Admitting: Internal Medicine

## 2019-09-05 NOTE — Assessment & Plan Note (Signed)
Has seen pulmonary.  Diagnosed with chronic bronchitis.  Breathing stable.

## 2019-09-05 NOTE — Assessment & Plan Note (Signed)
Off coumadin secondary to GI bleeds.  Stable.  Follow. Continue metoprolol.  Rated controlled.

## 2019-09-05 NOTE — Assessment & Plan Note (Signed)
Blood pressure doing well.  On micardis, metoprolol and taking lasix 2x/week.  Follow pressures.  Follow metabolic panel.

## 2019-09-05 NOTE — Assessment & Plan Note (Signed)
Continue lipitor.  Off aspirin due to recurring bleeds.  Follow.

## 2019-09-05 NOTE — Assessment & Plan Note (Signed)
On lipitor.  Follow lipid panel and liver function tests.   

## 2019-09-05 NOTE — Assessment & Plan Note (Signed)
Follow met b and a1c.  

## 2019-09-05 NOTE — Assessment & Plan Note (Signed)
Followed by neurology.  Stable  

## 2019-09-05 NOTE — Assessment & Plan Note (Signed)
Decreased weight.  Reports is eating.  Follow.

## 2019-09-05 NOTE — Assessment & Plan Note (Signed)
EF 45-50%.  No evidence of volume overload.  Continue micardis, metoprolol and lasix 2x/week.  Stable.  Follow.

## 2019-09-05 NOTE — Assessment & Plan Note (Signed)
Follow cbc.  

## 2019-09-05 NOTE — Assessment & Plan Note (Signed)
No further bleeding since his last hospitalization.  Follow.

## 2019-11-09 ENCOUNTER — Other Ambulatory Visit: Payer: Self-pay | Admitting: Internal Medicine

## 2019-11-29 ENCOUNTER — Ambulatory Visit (INDEPENDENT_AMBULATORY_CARE_PROVIDER_SITE_OTHER): Payer: Medicare HMO | Admitting: Internal Medicine

## 2019-11-29 ENCOUNTER — Encounter: Payer: Self-pay | Admitting: Internal Medicine

## 2019-11-29 ENCOUNTER — Other Ambulatory Visit: Payer: Self-pay

## 2019-11-29 VITALS — BP 120/64 | HR 53 | Temp 97.7°F | Resp 15 | Ht 65.0 in | Wt 134.0 lb

## 2019-11-29 DIAGNOSIS — R42 Dizziness and giddiness: Secondary | ICD-10-CM

## 2019-11-29 DIAGNOSIS — I482 Chronic atrial fibrillation, unspecified: Secondary | ICD-10-CM

## 2019-11-29 DIAGNOSIS — R739 Hyperglycemia, unspecified: Secondary | ICD-10-CM

## 2019-11-29 DIAGNOSIS — D508 Other iron deficiency anemias: Secondary | ICD-10-CM | POA: Diagnosis not present

## 2019-11-29 DIAGNOSIS — E7800 Pure hypercholesterolemia, unspecified: Secondary | ICD-10-CM

## 2019-11-29 DIAGNOSIS — R600 Localized edema: Secondary | ICD-10-CM

## 2019-11-29 DIAGNOSIS — G231 Progressive supranuclear ophthalmoplegia [Steele-Richardson-Olszewski]: Secondary | ICD-10-CM

## 2019-11-29 DIAGNOSIS — I10 Essential (primary) hypertension: Secondary | ICD-10-CM

## 2019-11-29 DIAGNOSIS — I429 Cardiomyopathy, unspecified: Secondary | ICD-10-CM

## 2019-11-29 DIAGNOSIS — J411 Mucopurulent chronic bronchitis: Secondary | ICD-10-CM

## 2019-11-29 DIAGNOSIS — I700 Atherosclerosis of aorta: Secondary | ICD-10-CM

## 2019-11-29 DIAGNOSIS — I2510 Atherosclerotic heart disease of native coronary artery without angina pectoris: Secondary | ICD-10-CM

## 2019-11-29 NOTE — Progress Notes (Addendum)
[name]: Tyler Stout, male   DOB: 06/21/1925, 86 y.o.   MRN: [id number]   Subjective:    [name]: Tyler Stout, male    DOB: 07-24-1925, 86 y.o.   MRN: [id number]  HPI This visit occurred during the SARS-CoV-2 public health emergency.  Safety protocols were in place, including screening questions prior to the visit, additional usage of staff PPE, and extensive cleaning of exam room while observing appropriate contact time as indicated for disinfecting solutions.  Patient here for a scheduled follow up.  He is accompanied by his daughter.  History obtained from both of them.  Has a history of afib, rectal/GI bleeding.  Remains off anticoagulants do to bleeding.  Is s/p CABG in 1991 and denies any chest pain.  He is followed by cardiology and was just evaluated 11/24/19.  No changes made.  He had informed them that he was having problems with cpap.  Feels like the pressure is too high.  They were going to contact [facility] for further treatment and evaluation.  He did fall three weeks ago.  Hi his elbow.  No pain or problems now.  Discussed using cane for support.  No increased heart rate or palpitations.  Lower extremity swelling is better.  Not taking lasix regularly currently.  Eating.  Bowels moving.   Past Medical History:  Diagnosis Date  . Anemia    iron deficiency  . Arthritis   . Atrial fibrillation ([place])   . Bilateral renal cysts    worked up by Dr Bernardo Heater  . BPH (benign prostatic hypertrophy)   . CAD (coronary artery disease)    s/p bypass graft x 4 (1991)  . Diverticulosis   . GERD (gastroesophageal reflux disease)   . GI bleed   . Glaucoma   . Hypercholesterolemia   . Hypertension   . Pancreatitis    x2 (1994 and 1998), presumed to be gallstone pancreatitis.  s/p ERCP and sphincterotomy   Past Surgical History:  Procedure Laterality Date  . APPENDECTOMY    . CHOLECYSTECTOMY  1994  . COLONOSCOPY N/A 07/14/2019   Procedure: COLONOSCOPY;  Surgeon: Virgel Manifold,  MD;  Location: [facility];  Service: Endoscopy;  Laterality: N/A;  . CORONARY ARTERY BYPASS GRAFT     x 4,  1991  . EMBOLIZATION Right 05/25/2019   Procedure: EMBOLIZATION (Right Colon);  Surgeon: Katha Cabal, MD;  Location: [facility];  Service: Cardiovascular;  Laterality: Right;  . INGUINAL HERNIA REPAIR  2009  . open heart surgery  1991   Family History  Problem Relation Age of Onset  . Throat cancer Father        oropharyngeal cancer  . Kidney disease Sister   . Heart disease Other   . Colon cancer Neg Hx   . Prostate cancer Neg Hx    Social History   Socioeconomic History  . Marital status: Married    Spouse name: Not on file  . Number of children: Not on file  . Years of education: Not on file  . Highest education level: Not on file  Occupational History  . Not on file  Tobacco Use  . Smoking status: Former Smoker    Packs/day: 2.00    Years: 25.00    Pack years: 50.00    Types: Cigarettes    Quit date: 01/14/1964    Years since quitting: 55.9  . Smokeless tobacco: Never Used  . Tobacco comment: smoked 2 packs/day, 1 pipe and 1  cigar once in a while.  Substance and Sexual Activity  . Alcohol use: No    Alcohol/week: 0.0 standard drinks  . Drug use: No  . Sexual activity: Not on file  Other Topics Concern  . Not on file  Social History Narrative  . Not on file   Social Determinants of Health   Financial Resource Strain:   . Difficulty of Paying Living Expenses: Not on file  Food Insecurity:   . Worried About Charity fundraiser in the Last Year: Not on file  . Ran Out of Food in the Last Year: Not on file  Transportation Needs:   . Lack of Transportation (Medical): Not on file  . Lack of Transportation (Non-Medical): Not on file  Physical Activity:   . Days of Exercise per Week: Not on file  . Minutes of Exercise per Session: Not on file  Stress:   . Feeling of Stress : Not on file  Social Connections:   . Frequency of  Communication with Friends and Family: Not on file  . Frequency of Social Gatherings with Friends and Family: Not on file  . Attends Religious Services: Not on file  . Active Member of Clubs or Organizations: Not on file  . Attends Archivist Meetings: Not on file  . Marital Status: Not on file    Outpatient Encounter Medications as of 11/29/2019  Medication Sig  . albuterol (VENTOLIN HFA) 108 (90 Base) MCG/ACT inhaler Inhale 2 puffs into the lungs every 6 (six) hours as needed for wheezing or shortness of breath.  Marland Kitchen ascorbic acid (VITAMIN C) 250 MG CHEW Chew 250 mg by mouth daily.  Marland Kitchen aspirin EC 81 MG tablet Take 81 mg by mouth at bedtime.   Marland Kitchen atorvastatin (LIPITOR) 20 MG tablet TAKE 1 TABLET (20 MG TOTAL) BY MOUTH DAILY. (Patient taking differently: Take 20 mg by mouth every evening. )  . azelastine (ASTELIN) 0.1 % nasal spray PLACE 1 SPRAY INTO BOTH NOSTRILS 2 (TWO) TIMES DAILY (Patient taking differently: Place 1 spray into both nostrils 2 (two) times daily as needed for rhinitis or allergies. )  . BREO ELLIPTA 200-25 MCG/INH AEPB Inhale 1 puff into the lungs daily.  . carbidopa-levodopa (SINEMET CR) 50-200 MG tablet Take 1 tablet by mouth at bedtime.  . carbidopa-levodopa (SINEMET IR) 25-100 MG tablet Take 2 tablets by mouth 3 (three) times daily.  . Cholecalciferol (VITAMIN D) 50 MCG (2000 UT) tablet Take 2,000 Units by mouth every evening.   . citalopram (CELEXA) 20 MG tablet TAKE 1 TABLET (20 MG TOTAL) BY MOUTH DAILY. (Patient taking differently: Take 20 mg by mouth daily. )  . fluticasone (FLONASE) 50 MCG/ACT nasal spray SPRAY 2 SPRAYS INTO EACH NOSTRIL EVERY DAY (Patient taking differently: Place 2 sprays into both nostrils daily as needed for allergies or rhinitis. )  . furosemide (LASIX) 20 MG tablet Take 20 mg by mouth 2 (two) times a week. (take on Monday and Thursday)  . guaiFENesin (MUCINEX) 600 MG 12 hr tablet Take 600 mg by mouth 2 (two) times daily.  Marland Kitchen latanoprost  (XALATAN) 0.005 % ophthalmic solution Place 1 drop into both eyes at bedtime.   . metoprolol tartrate (LOPRESSOR) 25 MG tablet Take 25 mg by mouth 2 (two) times daily.   . Multiple Vitamin (MULTIVITAMIN WITH MINERALS) TABS tablet Take 1 tablet by mouth every evening.   . pantoprazole (PROTONIX) 40 MG tablet TAKE 1 TABLET BY MOUTH EVERY DAY (Patient taking differently: Take 40  mg by mouth daily. )  . potassium chloride (KLOR-CON) 10 MEQ tablet Take one tablet by mouth with lasix. (Patient taking differently: Take 10 mEq by mouth 2 (two) times a week. (take with furosemide))  . tamsulosin (FLOMAX) 0.4 MG CAPS capsule TAKE 1 CAPSULE BY MOUTH EVERY DAY  . telmisartan (MICARDIS) 20 MG tablet Take 20 mg by mouth daily.   No facility-administered encounter medications on file as of 11/29/2019.    Review of Systems  Constitutional: Negative for appetite change and unexpected weight change.  HENT: Negative for congestion and sinus pressure.   Respiratory: Negative for cough, chest tightness and shortness of breath.   Cardiovascular: Negative for chest pain and palpitations.       No increased leg swelling currently.    Gastrointestinal: Negative for abdominal pain, diarrhea, nausea and vomiting.  Genitourinary: Negative for difficulty urinating and dysuria.  Musculoskeletal: Negative for joint swelling and myalgias.  Neurological: Negative for headaches.       No increased dizziness.   Psychiatric/Behavioral: Negative for agitation and dysphoric mood.       Objective:    Physical Exam Vitals reviewed.  Constitutional:      General: He is not in acute distress.    Appearance: Normal appearance. He is well-developed.  HENT:     Head: Normocephalic and atraumatic.     Right Ear: External ear normal.     Left Ear: External ear normal.  Eyes:     General: No scleral icterus.       Right eye: No discharge.        Left eye: No discharge.     Conjunctiva/sclera: Conjunctivae normal.   Cardiovascular:     Rate and Rhythm: Normal rate.     Comments: Rate controlled.  Pulmonary:     Effort: Pulmonary effort is normal. No respiratory distress.     Breath sounds: Normal breath sounds.  Abdominal:     General: Bowel sounds are normal.     Palpations: Abdomen is soft.     Tenderness: There is no abdominal tenderness.  Musculoskeletal:        General: No swelling or tenderness.  Skin:    Findings: No erythema or rash.  Neurological:     Mental Status: He is alert.  Psychiatric:        Mood and Affect: Mood normal.        Behavior: Behavior normal.     BP 120/64 (BP Location: Left Arm, Patient Position: Sitting, Cuff Size: Normal)   Pulse (!) 53   Temp 97.7 F (36.5 C) (Oral)   Resp 15   Ht _0  (1.651 m)   Wt 134 lb (60.8 kg)   SpO2 98%   BMI 22.30 kg/m  Wt Readings from Last 3 Encounters:  11/29/19 134 lb (60.8 kg)  08/29/19 128 lb 6.4 oz (58.2 kg)  07/22/19 134 lb 3.2 oz (60.9 kg)     Lab Results  Component Value Date   WBC 5.6 11/29/2019   HGB 11.7 (L) 11/29/2019   HCT 35.3 (L) 11/29/2019   PLT 211.0 11/29/2019   GLUCOSE 94 11/29/2019   CHOL 111 11/29/2019   TRIG 88.0 11/29/2019   HDL 48.50 11/29/2019   LDLCALC 45 11/29/2019   ALT 7 11/29/2019   AST 20 11/29/2019   NA 133 (L) 11/29/2019   K 4.4 11/29/2019   CL 99 11/29/2019   CREATININE 0.77 11/29/2019   BUN 16 11/29/2019   CO2 28 11/29/2019  TSH 1.06 04/28/2019   INR 1.2 05/25/2019   HGBA1C 5.9 11/29/2019    NM GI Blood Loss  Result Date: 07/12/2019 CLINICAL DATA:  Lower GI bleeding. EXAM: NUCLEAR MEDICINE GASTROINTESTINAL BLEEDING SCAN TECHNIQUE: Sequential abdominal images were obtained following intravenous administration of Tc-93mlabeled red blood cells. RADIOPHARMACEUTICALS:  24.46 mCi Tc-972mertechnetate in-vitro labeled red cells. COMPARISON:  May 25, 2019 FINDINGS: There is normal distribution of radiotracer on the provided images. Radiotracer is seen accumulating in the  urinary bladder. There is radiotracer projecting over the low midline pelvis which may be penile in location and is similar to the patient's prior study from May 25, 2019. There is no evidence for active GI bleed on this study. IMPRESSION: No evidence for active GI bleeding. Electronically Signed   By: ChConstance Holster.D.   On: 07/12/2019 18:32       Assessment & Plan:   Problem List Items Addressed This Visit    Thoracic aortic atherosclerosis ([facility]   Continue lipitor.  Off aspirin secondary to recurring GI bleed.        Progressive supranuclear palsy (HCFort [place]   Followed by neurology.  Discussed using a cane for support.  Feel would benefit from physical therapy to help with muscle strengthening and core strengthening.        Lower extremity edema    No increased swelling.  Follow.        Hyperglycemia    Follow met b and a1c.       Relevant Orders   Hemoglobin A1c (Completed)   Hypercholesteremia    On lipitor.  Follow lipid panel and liver function tests.        Relevant Orders   Hepatic function panel (Completed)   Lipid panel (Completed)   Essential hypertension - Primary    On micardis and metoprolol.  Blood pressure doing well.  Follow.        Relevant Orders   CBC with Differential/Platelet (Completed)   Basic metabolic panel (Completed)   Dizziness    Chronic dizziness.  Sees neurology.       Chronic bronchitis (HCC)    Breathing stable.  Has been evaluated by pulmonary.       Cardiomyopathy ([facility]   EF on recent echo 45-50%.  No evidence of volume overload on exam.  Continue micardis and metoprolol.  Not taking lasix regularly now.  Follow.        CAD (coronary artery disease)    Followed by cardiology.  Stable.        Atrial fibrillation ([place]   Off coumadin secondary to GI bleed.  On metoprolol.  Rated controlled.  Followed by cardiology.       Anemia    Follow cbc.  No recent bleeding.           ChEinar PheasantMD

## 2019-11-30 LAB — BASIC METABOLIC PANEL
BUN: 16 mg/dL (ref 6–23)
CO2: 28 mEq/L (ref 19–32)
Calcium: 8.7 mg/dL (ref 8.4–10.5)
Chloride: 99 mEq/L (ref 96–112)
Creatinine, Ser: 0.77 mg/dL (ref 0.40–1.50)
GFR: 77.77 mL/min (ref >60.00)
Glucose, Bld: 94 mg/dL (ref 70–99)
Potassium: 4.4 mEq/L (ref 3.5–5.1)
Sodium: 133 mEq/L — ABNORMAL LOW (ref 135–145)

## 2019-11-30 LAB — HEPATIC FUNCTION PANEL
ALT: 7 U/L (ref 0–53)
AST: 20 U/L (ref 0–37)
Albumin: 4 g/dL (ref 3.5–5.2)
Alkaline Phosphatase: 102 U/L (ref 39–117)
Bilirubin, Direct: 0.1 mg/dL (ref 0.0–0.3)
Total Bilirubin: 0.5 mg/dL (ref 0.2–1.2)
Total Protein: 6.2 g/dL (ref 6.0–8.3)

## 2019-11-30 LAB — CBC WITH DIFFERENTIAL/PLATELET
Basophils Absolute: 0 10*3/uL (ref 0.0–0.1)
Basophils Relative: 0.6 % (ref 0.0–3.0)
Eosinophils Absolute: 0.1 10*3/uL (ref 0.0–0.7)
Eosinophils Relative: 2.6 % (ref 0.0–5.0)
HCT: 35.3 % — ABNORMAL LOW (ref 39.0–52.0)
Hemoglobin: 11.7 g/dL — ABNORMAL LOW (ref 13.0–17.0)
Lymphocytes Relative: 14.2 % (ref 12.0–46.0)
Lymphs Abs: 0.8 10*3/uL (ref 0.7–4.0)
MCHC: 33.2 g/dL (ref 30.0–36.0)
MCV: 95.1 fl (ref 78.0–100.0)
Monocytes Absolute: 0.7 10*3/uL (ref 0.1–1.0)
Monocytes Relative: 13 % — ABNORMAL HIGH (ref 3.0–12.0)
Neutro Abs: 3.9 10*3/uL (ref 1.4–7.7)
Neutrophils Relative %: 69.6 % (ref 43.0–77.0)
Platelets: 211 10*3/uL (ref 150.0–400.0)
RBC: 3.72 Mil/uL — ABNORMAL LOW (ref 4.22–5.81)
RDW: 15.8 % — ABNORMAL HIGH (ref 11.5–15.5)
WBC: 5.6 10*3/uL (ref 4.0–10.5)

## 2019-11-30 LAB — HEMOGLOBIN A1C: Hgb A1c MFr Bld: 5.9 % (ref 4.6–6.5)

## 2019-11-30 LAB — LIPID PANEL
Cholesterol: 111 mg/dL (ref 0–200)
HDL: 48.5 mg/dL (ref >39.00)
LDL Cholesterol: 45 mg/dL (ref 0–99)
NonHDL: 62.32
Total CHOL/HDL Ratio: 2
Triglycerides: 88 mg/dL (ref 0.0–149.0)
VLDL: 17.6 mg/dL (ref 0.0–40.0)

## 2019-12-01 ENCOUNTER — Other Ambulatory Visit: Payer: Self-pay | Admitting: Internal Medicine

## 2019-12-01 ENCOUNTER — Telehealth: Payer: Self-pay

## 2019-12-01 DIAGNOSIS — E871 Hypo-osmolality and hyponatremia: Secondary | ICD-10-CM

## 2019-12-01 NOTE — Telephone Encounter (Signed)
Pt's daughter called and states that Mr. Uy would like to try physical therapy as suggested by Dr Nicki Reaper. He would also like a call back about labs

## 2019-12-01 NOTE — Telephone Encounter (Signed)
Noted  

## 2019-12-01 NOTE — Telephone Encounter (Signed)
I will call them with labs but wanted to let you know about PT.

## 2019-12-01 NOTE — Telephone Encounter (Signed)
I had told them to let me know if agreeable for PT referral.  I will place order.

## 2019-12-01 NOTE — Progress Notes (Signed)
Order placed for f/u sodium check.   

## 2019-12-04 ENCOUNTER — Encounter: Payer: Self-pay | Admitting: Internal Medicine

## 2019-12-04 NOTE — Assessment & Plan Note (Signed)
Breathing stable.  Has been evaluated by pulmonary.

## 2019-12-04 NOTE — Assessment & Plan Note (Addendum)
Follow met b and a1c.

## 2019-12-04 NOTE — Assessment & Plan Note (Signed)
Continue lipitor.  Off aspirin secondary to recurring GI bleed.

## 2019-12-04 NOTE — Assessment & Plan Note (Signed)
Followed by cardiology. Stable.   

## 2019-12-04 NOTE — Assessment & Plan Note (Signed)
On micardis and metoprolol.  Blood pressure doing well.  Follow.

## 2019-12-04 NOTE — Assessment & Plan Note (Signed)
Chronic dizziness.  Sees neurology.

## 2019-12-04 NOTE — Assessment & Plan Note (Signed)
No increased swelling.  Follow.

## 2019-12-04 NOTE — Assessment & Plan Note (Signed)
EF on recent echo 45-50%.  No evidence of volume overload on exam.  Continue micardis and metoprolol.  Not taking lasix regularly now.  Follow.

## 2019-12-04 NOTE — Assessment & Plan Note (Signed)
On lipitor.  Follow lipid panel and liver function tests.   

## 2019-12-04 NOTE — Assessment & Plan Note (Addendum)
Followed by neurology.  Discussed using a cane for support.  Feel would benefit from physical therapy to help with muscle strengthening and core strengthening.

## 2019-12-04 NOTE — Assessment & Plan Note (Signed)
Off coumadin secondary to GI bleed.  On metoprolol.  Rated controlled.  Followed by cardiology.

## 2019-12-04 NOTE — Assessment & Plan Note (Signed)
Follow [facility].  No recent bleeding.

## 2019-12-14 ENCOUNTER — Other Ambulatory Visit: Payer: Self-pay | Admitting: Internal Medicine

## 2019-12-14 DIAGNOSIS — R531 Weakness: Secondary | ICD-10-CM

## 2019-12-14 DIAGNOSIS — R42 Dizziness and giddiness: Secondary | ICD-10-CM

## 2019-12-14 DIAGNOSIS — G231 Progressive supranuclear ophthalmoplegia [Steele-Richardson-Olszewski]: Secondary | ICD-10-CM

## 2019-12-14 NOTE — Progress Notes (Signed)
Order placed for home health PT.

## 2019-12-17 DIAGNOSIS — J411 Mucopurulent chronic bronchitis: Secondary | ICD-10-CM | POA: Diagnosis not present

## 2019-12-17 DIAGNOSIS — I482 Chronic atrial fibrillation, unspecified: Secondary | ICD-10-CM | POA: Diagnosis not present

## 2019-12-17 DIAGNOSIS — I429 Cardiomyopathy, unspecified: Secondary | ICD-10-CM | POA: Diagnosis not present

## 2019-12-17 DIAGNOSIS — D509 Iron deficiency anemia, unspecified: Secondary | ICD-10-CM | POA: Diagnosis not present

## 2019-12-17 DIAGNOSIS — G231 Progressive supranuclear ophthalmoplegia [Steele-Richardson-Olszewski]: Secondary | ICD-10-CM | POA: Diagnosis not present

## 2019-12-17 DIAGNOSIS — I700 Atherosclerosis of aorta: Secondary | ICD-10-CM | POA: Diagnosis not present

## 2019-12-17 DIAGNOSIS — I10 Essential (primary) hypertension: Secondary | ICD-10-CM | POA: Diagnosis not present

## 2019-12-17 DIAGNOSIS — I2510 Atherosclerotic heart disease of native coronary artery without angina pectoris: Secondary | ICD-10-CM | POA: Diagnosis not present

## 2019-12-17 DIAGNOSIS — E7800 Pure hypercholesterolemia, unspecified: Secondary | ICD-10-CM | POA: Diagnosis not present

## 2019-12-17 DIAGNOSIS — R251 Tremor, unspecified: Secondary | ICD-10-CM | POA: Diagnosis not present

## 2019-12-22 ENCOUNTER — Other Ambulatory Visit: Payer: Self-pay | Admitting: Internal Medicine

## 2019-12-22 ENCOUNTER — Other Ambulatory Visit (INDEPENDENT_AMBULATORY_CARE_PROVIDER_SITE_OTHER): Payer: Medicare HMO

## 2019-12-22 ENCOUNTER — Other Ambulatory Visit: Payer: Self-pay

## 2019-12-22 DIAGNOSIS — E871 Hypo-osmolality and hyponatremia: Secondary | ICD-10-CM

## 2019-12-22 LAB — SODIUM: Sodium: 132 mEq/L — ABNORMAL LOW (ref 135–145)

## 2019-12-22 NOTE — Progress Notes (Signed)
Order placed for f/u sodium.  ?

## 2019-12-23 ENCOUNTER — Other Ambulatory Visit: Payer: Self-pay

## 2020-01-03 ENCOUNTER — Ambulatory Visit (INDEPENDENT_AMBULATORY_CARE_PROVIDER_SITE_OTHER): Payer: Medicare HMO

## 2020-01-03 VITALS — Ht 65.0 in | Wt 134.0 lb

## 2020-01-03 DIAGNOSIS — Z0000 Encounter for general adult medical examination without abnormal findings: Secondary | ICD-10-CM | POA: Diagnosis not present

## 2020-01-03 NOTE — Progress Notes (Addendum)
Subjective:   Tyler Stout is a 86 y.o. male who presents for an Initial Medicare Annual Wellness Visit.  Review of Systems    No ROS.  Medicare Wellness Virtual Visit.    Cardiac Risk Factors include: advanced age (>40men, >29 women);hypertension;male gender     Objective:    Today's Vitals   01/03/20 1343  Weight: 134 lb (60.8 kg)  Height: 5\' 5"  (1.651 m)   Body mass index is 22.3 kg/m.  Advanced Directives 01/03/2020 07/14/2019 07/12/2019 07/12/2019 07/12/2019 05/25/2019 01/04/2019  Does Patient Have a Medical Advance Directive? Yes No Yes No No Yes No  Type of 01/06/2019 of [place];Living will - Healthcare Power of [place];Living will - - Healthcare Power of Long;Living will -  Does patient want to make changes to medical advance directive? No - Patient declined - No - Patient declined - - No - Patient declined -  Copy of Healthcare Power of Attorney in Chart? No - copy requested - - - - No - copy requested -  Would patient like information on creating a medical advance directive? - No - Patient declined No - Patient declined No - Patient declined No - Patient declined - No - Patient declined    Current Medications (verified) Outpatient Encounter Medications as of 01/03/2020  Medication Sig  . albuterol (VENTOLIN HFA) 108 (90 Base) MCG/ACT inhaler Inhale 2 puffs into the lungs every 6 (six) hours as needed for wheezing or shortness of breath.  01/05/2020 ascorbic acid (VITAMIN C) 250 MG CHEW Chew 250 mg by mouth daily.  Marland Kitchen aspirin EC 81 MG tablet Take 81 mg by mouth at bedtime.   Marland Kitchen atorvastatin (LIPITOR) 20 MG tablet TAKE 1 TABLET (20 MG TOTAL) BY MOUTH DAILY. (Patient taking differently: Take 20 mg by mouth every evening. )  . azelastine (ASTELIN) 0.1 % nasal spray PLACE 1 SPRAY INTO BOTH NOSTRILS 2 (TWO) TIMES DAILY (Patient taking differently: Place 1 spray into both nostrils 2 (two) times daily as needed for rhinitis or allergies. )  . BREO ELLIPTA  200-25 MCG/INH AEPB Inhale 1 puff into the lungs daily.  . carbidopa-levodopa (SINEMET CR) 50-200 MG tablet Take 1 tablet by mouth at bedtime.  . carbidopa-levodopa (SINEMET IR) 25-100 MG tablet Take 2 tablets by mouth 3 (three) times daily.  . Cholecalciferol (VITAMIN D) 50 MCG (2000 UT) tablet Take 2,000 Units by mouth every evening.   . citalopram (CELEXA) 20 MG tablet TAKE 1 TABLET (20 MG TOTAL) BY MOUTH DAILY. (Patient taking differently: Take 20 mg by mouth daily. )  . fluticasone (FLONASE) 50 MCG/ACT nasal spray SPRAY 2 SPRAYS INTO EACH NOSTRIL EVERY DAY (Patient taking differently: Place 2 sprays into both nostrils daily as needed for allergies or rhinitis. )  . furosemide (LASIX) 20 MG tablet Take 20 mg by mouth 2 (two) times a week. (take on Monday and Thursday)  . guaiFENesin (MUCINEX) 600 MG 12 hr tablet Take 600 mg by mouth 2 (two) times daily.  Tuesday latanoprost (XALATAN) 0.005 % ophthalmic solution Place 1 drop into both eyes at bedtime.   . metoprolol tartrate (LOPRESSOR) 25 MG tablet Take 25 mg by mouth 2 (two) times daily.   . Multiple Vitamin (MULTIVITAMIN WITH MINERALS) TABS tablet Take 1 tablet by mouth every evening.   . pantoprazole (PROTONIX) 40 MG tablet TAKE 1 TABLET BY MOUTH EVERY DAY (Patient taking differently: Take 40 mg by mouth daily. )  . potassium chloride (KLOR-CON) 10 MEQ tablet Take  one tablet by mouth with lasix. (Patient taking differently: Take 10 mEq by mouth 2 (two) times a week. (take with furosemide))  . tamsulosin (FLOMAX) 0.4 MG CAPS capsule TAKE 1 CAPSULE BY MOUTH EVERY DAY  . telmisartan (MICARDIS) 20 MG tablet Take 20 mg by mouth daily.   No facility-administered encounter medications on file as of 01/03/2020.    Allergies (verified) Ilosone [erythromycin] and Penicillins   History: Past Medical History:  Diagnosis Date  . Anemia    iron deficiency  . Arthritis   . Atrial fibrillation (HCC)   . Bilateral renal cysts    worked up by Dr Lonna Cobb   . BPH (benign prostatic hypertrophy)   . CAD (coronary artery disease)    s/p bypass graft x 4 (1991)  . Diverticulosis   . GERD (gastroesophageal reflux disease)   . GI bleed   . Glaucoma   . Hypercholesterolemia   . Hypertension   . Pancreatitis    x2 (1994 and 1998), presumed to be gallstone pancreatitis.  s/p ERCP and sphincterotomy   Past Surgical History:  Procedure Laterality Date  . APPENDECTOMY    . CHOLECYSTECTOMY  1994  . COLONOSCOPY N/A 07/14/2019   Procedure: COLONOSCOPY;  Surgeon: Pasty Spillers, MD;  Location: [facility] ENDOSCOPY;  Service: Endoscopy;  Laterality: N/A;  . CORONARY ARTERY BYPASS GRAFT     x 4,  1991  . EMBOLIZATION Right 05/25/2019   Procedure: EMBOLIZATION (Right Colon);  Surgeon: Renford Dills, MD;  Location: [facility] INVASIVE CV LAB;  Service: Cardiovascular;  Laterality: Right;  . INGUINAL HERNIA REPAIR  2009  . open heart surgery  1991   Family History  Problem Relation Age of Onset  . Throat cancer Father        oropharyngeal cancer  . Kidney disease Sister   . Heart disease Other   . Colon cancer Neg Hx   . Prostate cancer Neg Hx    Social History   Socioeconomic History  . Marital status: Married    Spouse name: Not on file  . Number of children: Not on file  . Years of education: Not on file  . Highest education level: Not on file  Occupational History  . Not on file  Tobacco Use  . Smoking status: Former Smoker    Packs/day: 2.00    Years: 25.00    Pack years: 50.00    Types: Cigarettes    Quit date: 01/14/1964    Years since quitting: 56.0  . Smokeless tobacco: Never Used  . Tobacco comment: smoked 2 packs/day, 1 pipe and 1 cigar once in a while.  Substance and Sexual Activity  . Alcohol use: No    Alcohol/week: 0.0 standard drinks  . Drug use: No  . Sexual activity: Not on file  Other Topics Concern  . Not on file  Social History Narrative  . Not on file   Social Determinants of Health   Financial Resource  Strain: Low Risk   . Difficulty of Paying Living Expenses: Not hard at all  Food Insecurity: No Food Insecurity  . Worried About Programme researcher, broadcasting/film/video in the Last Year: Never true  . Ran Out of Food in the Last Year: Never true  Transportation Needs: No Transportation Needs  . Lack of Transportation (Medical): No  . Lack of Transportation (Non-Medical): No  Physical Activity: Insufficiently Active  . Days of Exercise per Week: 1 day  . Minutes of Exercise per Session: 60 min  Stress:  No Stress Concern Present  . Feeling of Stress : Not at all  Social Connections: Unknown  . Frequency of Communication with Friends and Family: Not on file  . Frequency of Social Gatherings with Friends and Family: More than three times a week  . Attends Religious Services: Not on file  . Active Member of Clubs or Organizations: Not on file  . Attends Archivist Meetings: Not on file  . Marital Status: Married    Tobacco Counseling Counseling given: Not Answered Comment: smoked 2 packs/day, 1 pipe and 1 cigar once in a while.   Clinical Intake:  Pre-visit preparation completed: Yes        Diabetes: No  How often do you need to have someone help you when you read instructions, pamphlets, or other written materials from your doctor or pharmacy?: 4 - Often   Interpreter Needed?: No      Activities of Daily Living In your present state of health, do you have any difficulty performing the following activities: 01/03/2020 07/12/2019  Hearing? Y N  Comment Hearing aids -  Vision? N N  Difficulty concentrating or making decisions? Y N  Walking or climbing stairs? Y Y  Comment Unsteady gait. Walker in use. -  Dressing or bathing? N Y  Doing errands, shopping? Y Y  Comment He does not drive or run errands alone. -  Preparing Food and eating ? Y -  Comment Wife meal preps. Self feeds. -  Using the Toilet? N -  In the past six months, have you accidently leaked urine? Y -  Comment  Managed with daily brief -  Do you have problems with loss of bowel control? N -  Managing your Medications? Y -  Comment Daughter assist -  Managing your Finances? Y -  Comment Daughter assist -  Housekeeping or managing your Housekeeping? Y -  Comment Daughter assist -  Some recent data might be hidden    Patient Care Team: [name], MD as PCP - General (Internal Medicine)  Indicate any recent Medical Services you may have received from other than [facility] providers in the past year (date may be approximate).     Assessment:   This is a routine wellness examination for [facility].  I connected with Gopal today by telephone and verified that I am speaking with the correct person using two identifiers. Location patient: home Location provider: work Persons participating in the virtual visit: patient, daughter, Marine scientist.    I discussed the limitations, risks, security and privacy concerns of performing an evaluation and management service by telephone and the availability of in person appointments. The patient expressed understanding and verbally consented to this telephonic visit.    Interactive audio and video telecommunications were attempted between this provider and patient, however failed, due to patient having technical difficulties OR patient did not have access to video capability.  We continued and completed visit with audio only.  Some vital signs may be absent or patient reported.   Hearing/Vision screen  Hearing Screening   125Hz  250Hz  500Hz  1000Hz  2000Hz  3000Hz  4000Hz  6000Hz  8000Hz   Right ear:           Left ear:           Comments: Patient is able to hear conversational tones without difficulty.  No issues reported.   Vision Screening Comments: Visual acuity not assessed, virtual visit.     Dietary issues and exercise activities discussed: Current Exercise Habits: Home exercise routine, Time (Minutes): 60,  Frequency (Times/Week): 1, Weekly Exercise  (Minutes/Week): 60, Intensity: Mild  Regular diet  Goals    . Follow up with Primary Care Provider     As needed. Keep all routine scheduled appointments.       Depression Screen PHQ 2/9 Scores 01/03/2020 04/28/2019 12/29/2018 06/27/2016 11/21/2015 06/20/2015 11/09/2013  PHQ - 2 Score 0 0 0 0 0 0 0    Fall Risk Fall Risk  01/03/2020 11/29/2019 04/28/2019 10/05/2018 06/27/2016  Falls in the past year? 1 1 1 1  Yes  Number falls in past yr: 1 1 0 1 2 or more  Injury with Fall? 0 0 0 - No  Risk for fall due to : History of fall(s);Impaired balance/gait History of fall(s) - Impaired balance/gait;Impaired mobility History of fall(s)  Follow up Falls evaluation completed Falls evaluation completed Falls evaluation completed Falls evaluation completed -    FALL RISK PREVENTION PERTAINING TO THE HOME: Handrails in use when using stairs? Yes Home free of loose throw rugs in walkways, pet beds, electrical cords, etc? Yes  Adequate lighting in your home to reduce risk of falls? Yes   ASSISTIVE DEVICES UTILIZED TO PREVENT FALLS: Use of a cane, walker or w/c? Yes  Grab bars in the bathroom? Yes   Shower chair or bench in shower? Yes   Elevated toilet seat or a handicapped toilet? Yes   TIMED UP AND GO: Was the test performed? No . Virtual visit.   Cognitive Function:  MMSE/6CIT deferred per patient preference.   Patient is alert.  Enjoys jigsaw puzzles, crossword puzzles, and word search for brain health.     Immunizations Immunization History  Administered Date(s) Administered  . Influenza Split 10/07/2012, 09/16/2018  . Influenza, High Dose Seasonal PF 09/22/2016, 09/28/2017, 09/16/2018  . Influenza,inj,Quad PF,6+ Mos 11/08/2014  . Influenza-Unspecified 10/09/2015, 10/12/2019  . PFIZER SARS-COV-2 Vaccination 05/05/2019, 05/31/2019  . Pneumococcal Conjugate-13 08/29/2013, 09/16/2018  . Pneumococcal Polysaccharide-23 11/08/2014    TDAP status: Due, Education has been provided  regarding the importance of this vaccine. Advised may receive this vaccine at local pharmacy or Health Dept. Aware to provide a copy of the vaccination record if obtained from local pharmacy or Health Dept. Verbalized acceptance and understanding.  Deferred.   Health Maintenance Health Maintenance  Topic Date Due  . TETANUS/TDAP  Never done  . COVID-19 Vaccine (3 - Booster for Pfizer series) 12/01/2019  . INFLUENZA VACCINE  Completed  . PNA vac Low Risk Adult  Completed   Colorectal cancer screening: No longer required.   Lung Cancer Screening: (Low Dose CT Chest recommended if Age 94-80 years, 30 pack-year currently smoking OR have quit w/in 15years.) does not qualify.    Hepatitis C Screening: does not qualify.  Covid vaccine booster- not completed. Deferred.   Vision Screening: Recommended annual ophthalmology exams for early detection of glaucoma and other disorders of the eye. Is the patient up to date with their annual eye exam?  Yes   Dental Screening: Recommended annual dental exams for proper oral hygiene. Partial denture.   Community Resource Referral / Chronic Care Management: CRR required this visit?  No   CCM required this visit?  No      Plan:   Keep all routine maintenance appointments.   Next scheduled lab 01/24/20 @ 2:00   Follow up 03/05/20 @ 2:00  I have personally reviewed and noted the following in the patient's chart:   . Medical and social history . Use of alcohol, tobacco or illicit drugs  .  Current medications and supplements . Functional ability and status . Nutritional status . Physical activity . Advanced directives . List of other physicians . Hospitalizations, surgeries, and ER visits in previous 12 months . Vitals . Screenings to include cognitive, depression, and falls . Referrals and appointments  In addition, I have reviewed and discussed with patient certain preventive protocols, quality metrics, and best practice recommendations. A  written personalized care plan for preventive services as well as general preventive health recommendations were provided to patient via mail.     Varney Biles, LPN   [id number]

## 2020-01-03 NOTE — Patient Instructions (Addendum)
Mr. Tyler Stout , Thank you for taking time to come for your Medicare Wellness Visit. I appreciate your ongoing commitment to your health goals. Please review the following plan we discussed and let me know if I can assist you in the future.   These are the goals we discussed: Goals     Follow up with Primary Care Provider     As needed. Keep all routine scheduled appointments.        This is a list of the screening recommended for you and due dates:  Health Maintenance  Topic Date Due   Tetanus Vaccine  Never done   COVID-19 Vaccine (3 - Booster for [place] series) 12/01/2019   Flu Shot  Completed   Pneumonia vaccines  Completed    Immunizations Immunization History  Administered Date(s) Administered   Influenza Split 10/07/2012, 09/16/2018   Influenza, High Dose Seasonal PF 09/22/2016, 09/28/2017, 09/16/2018   Influenza,inj,Quad PF,6+ Mos 11/08/2014   Influenza-Unspecified 10/09/2015, 10/12/2019   PFIZER SARS-COV-2 Vaccination 05/05/2019, 05/31/2019   Pneumococcal Conjugate-13 08/29/2013, 09/16/2018   Pneumococcal Polysaccharide-23 11/08/2014   Keep all routine maintenance appointments.   Next scheduled lab 01/24/20 @ 2:00   Follow up 03/05/20 @ 2:00  Advanced directives: End of life planning; Advance aging; Advanced directives discussed.  Copy of current HCPOA/Living Will requested.    Conditions/risks identified: none new.   Follow up in one year for your annual wellness visit.   Preventive Care 65 Years and Older, Male Preventive care refers to lifestyle choices and visits with your health care provider that can promote health and wellness. What does preventive care include?  A yearly physical exam. This is also called an annual well check.  Dental exams once or twice a year.  Routine eye exams. Ask your health care provider how often you should have your eyes checked.  Personal lifestyle choices, including:  Daily care of your teeth and  gums.  Regular physical activity.  Eating a healthy diet.  Avoiding tobacco and drug use.  Limiting alcohol use.  Practicing safe sex.  Taking low doses of aspirin every day.  Taking vitamin and mineral supplements as recommended by your health care provider. What happens during an annual well check? The services and screenings done by your health care provider during your annual well check will depend on your age, overall health, lifestyle risk factors, and family history of disease. Counseling  Your health care provider may ask you questions about your:  Alcohol use.  Tobacco use.  Drug use.  Emotional well-being.  Home and relationship well-being.  Sexual activity.  Eating habits.  History of falls.  Memory and ability to understand (cognition).  Work and work Statistician. Screening  You may have the following tests or measurements:  Height, weight, and BMI.  Blood pressure.  Lipid and cholesterol levels. These may be checked every 5 years, or more frequently if you are over 44 years old.  Skin check.  Lung cancer screening. You may have this screening every year starting at age 37 if you have a 30-pack-year history of smoking and currently smoke or have quit within the past 15 years.  Fecal occult blood test (FOBT) of the stool. You may have this test every year starting at age 53.  Flexible sigmoidoscopy or colonoscopy. You may have a sigmoidoscopy every 5 years or a colonoscopy every 10 years starting at age 28.  Prostate cancer screening. Recommendations will vary depending on your family history and other risks.  Hepatitis C  blood test.  Hepatitis B blood test.  Sexually transmitted disease (STD) testing.  Diabetes screening. This is done by checking your blood sugar (glucose) after you have not eaten for a while (fasting). You may have this done every 1-3 years.  Abdominal aortic aneurysm (AAA) screening. You may need this if you are a  current or former smoker.  Osteoporosis. You may be screened starting at age 16 if you are at high risk. Talk with your health care provider about your test results, treatment options, and if necessary, the need for more tests. Vaccines  Your health care provider may recommend certain vaccines, such as:  Influenza vaccine. This is recommended every year.  Tetanus, diphtheria, and acellular pertussis (Tdap, Td) vaccine. You may need a Td booster every 10 years.  Zoster vaccine. You may need this after age 70.  Pneumococcal 13-valent conjugate (PCV13) vaccine. One dose is recommended after age 63.  Pneumococcal polysaccharide (PPSV23) vaccine. One dose is recommended after age 81. Talk to your health care provider about which screenings and vaccines you need and how often you need them. This information is not intended to replace advice given to you by your health care provider. Make sure you discuss any questions you have with your health care provider. Document Released: 01/26/2015 Document Revised: 09/19/2015 Document Reviewed: 10/31/2014 Elsevier Interactive Patient Education  2017 [place] Prevention in the Home Falls can cause injuries. They can happen to people of all ages. There are many things you can do to make your home safe and to help prevent falls. What can I do on the outside of my home?  Regularly fix the edges of walkways and driveways and fix any cracks.  Remove anything that might make you trip as you walk through a door, such as a raised step or threshold.  Trim any bushes or trees on the path to your home.  Use bright outdoor lighting.  Clear any walking paths of anything that might make someone trip, such as rocks or tools.  Regularly check to see if handrails are loose or broken. Make sure that both sides of any steps have handrails.  Any raised decks and porches should have guardrails on the edges.  Have any leaves, snow, or ice cleared  regularly.  Use sand or salt on walking paths during winter.  Clean up any spills in your garage right away. This includes oil or grease spills. What can I do in the bathroom?  Use night lights.  Install grab bars by the toilet and in the tub and shower. Do not use towel bars as grab bars.  Use non-skid mats or decals in the tub or shower.  If you need to sit down in the shower, use a plastic, non-slip stool.  Keep the floor dry. Clean up any water that spills on the floor as soon as it happens.  Remove soap buildup in the tub or shower regularly.  Attach bath mats securely with double-sided non-slip rug tape.  Do not have throw rugs and other things on the floor that can make you trip. What can I do in the bedroom?  Use night lights.  Make sure that you have a light by your bed that is easy to reach.  Do not use any sheets or blankets that are too big for your bed. They should not hang down onto the floor.  Have a firm chair that has side arms. You can use this for support while you get dressed.  Do not have throw rugs and other things on the floor that can make you trip. What can I do in the kitchen?  Clean up any spills right away.  Avoid walking on wet floors.  Keep items that you use a lot in easy-to-reach places.  If you need to reach something above you, use a strong step stool that has a grab bar.  Keep electrical cords out of the way.  Do not use floor polish or wax that makes floors slippery. If you must use wax, use non-skid floor wax.  Do not have throw rugs and other things on the floor that can make you trip. What can I do with my stairs?  Do not leave any items on the stairs.  Make sure that there are handrails on both sides of the stairs and use them. Fix handrails that are broken or loose. Make sure that handrails are as long as the stairways.  Check any carpeting to make sure that it is firmly attached to the stairs. Fix any carpet that is loose  or worn.  Avoid having throw rugs at the top or bottom of the stairs. If you do have throw rugs, attach them to the floor with carpet tape.  Make sure that you have a light switch at the top of the stairs and the bottom of the stairs. If you do not have them, ask someone to add them for you. What else can I do to help prevent falls?  Wear shoes that:  Do not have high heels.  Have rubber bottoms.  Are comfortable and fit you well.  Are closed at the toe. Do not wear sandals.  If you use a stepladder:  Make sure that it is fully opened. Do not climb a closed stepladder.  Make sure that both sides of the stepladder are locked into place.  Ask someone to hold it for you, if possible.  Clearly mark and make sure that you can see:  Any grab bars or handrails.  First and last steps.  Where the edge of each step is.  Use tools that help you move around (mobility aids) if they are needed. These include:  Canes.  Walkers.  Scooters.  Crutches.  Turn on the lights when you go into a dark area. Replace any light bulbs as soon as they burn out.  Set up your furniture so you have a clear path. Avoid moving your furniture around.  If any of your floors are uneven, fix them.  If there are any pets around you, be aware of where they are.  Review your medicines with your doctor. Some medicines can make you feel dizzy. This can increase your chance of falling. Ask your doctor what other things that you can do to help prevent falls. This information is not intended to replace advice given to you by your health care provider. Make sure you discuss any questions you have with your health care provider. Document Released: 10/26/2008 Document Revised: 06/07/2015 Document Reviewed: 02/03/2014 Elsevier Interactive Patient Education  2017 [employer].

## 2020-01-18 DIAGNOSIS — I700 Atherosclerosis of aorta: Secondary | ICD-10-CM | POA: Diagnosis not present

## 2020-01-18 DIAGNOSIS — D509 Iron deficiency anemia, unspecified: Secondary | ICD-10-CM | POA: Diagnosis not present

## 2020-01-18 DIAGNOSIS — R251 Tremor, unspecified: Secondary | ICD-10-CM | POA: Diagnosis not present

## 2020-01-18 DIAGNOSIS — I10 Essential (primary) hypertension: Secondary | ICD-10-CM | POA: Diagnosis not present

## 2020-01-18 DIAGNOSIS — J411 Mucopurulent chronic bronchitis: Secondary | ICD-10-CM | POA: Diagnosis not present

## 2020-01-18 DIAGNOSIS — G231 Progressive supranuclear ophthalmoplegia [Steele-Richardson-Olszewski]: Secondary | ICD-10-CM | POA: Diagnosis not present

## 2020-01-18 DIAGNOSIS — E7800 Pure hypercholesterolemia, unspecified: Secondary | ICD-10-CM | POA: Diagnosis not present

## 2020-01-18 DIAGNOSIS — I429 Cardiomyopathy, unspecified: Secondary | ICD-10-CM | POA: Diagnosis not present

## 2020-01-18 DIAGNOSIS — I2510 Atherosclerotic heart disease of native coronary artery without angina pectoris: Secondary | ICD-10-CM | POA: Diagnosis not present

## 2020-01-18 DIAGNOSIS — I482 Chronic atrial fibrillation, unspecified: Secondary | ICD-10-CM | POA: Diagnosis not present

## 2020-01-20 ENCOUNTER — Other Ambulatory Visit: Payer: Medicare HMO

## 2020-01-23 DIAGNOSIS — E785 Hyperlipidemia, unspecified: Secondary | ICD-10-CM | POA: Diagnosis not present

## 2020-01-23 DIAGNOSIS — I2510 Atherosclerotic heart disease of native coronary artery without angina pectoris: Secondary | ICD-10-CM | POA: Diagnosis not present

## 2020-01-23 DIAGNOSIS — E7800 Pure hypercholesterolemia, unspecified: Secondary | ICD-10-CM | POA: Diagnosis not present

## 2020-01-23 DIAGNOSIS — H409 Unspecified glaucoma: Secondary | ICD-10-CM | POA: Diagnosis not present

## 2020-01-23 DIAGNOSIS — I509 Heart failure, unspecified: Secondary | ICD-10-CM | POA: Diagnosis not present

## 2020-01-23 DIAGNOSIS — I429 Cardiomyopathy, unspecified: Secondary | ICD-10-CM | POA: Diagnosis not present

## 2020-01-23 DIAGNOSIS — R69 Illness, unspecified: Secondary | ICD-10-CM | POA: Diagnosis not present

## 2020-01-23 DIAGNOSIS — I4891 Unspecified atrial fibrillation: Secondary | ICD-10-CM | POA: Diagnosis not present

## 2020-01-23 DIAGNOSIS — D509 Iron deficiency anemia, unspecified: Secondary | ICD-10-CM | POA: Diagnosis not present

## 2020-01-23 DIAGNOSIS — I10 Essential (primary) hypertension: Secondary | ICD-10-CM | POA: Diagnosis not present

## 2020-01-23 DIAGNOSIS — I110 Hypertensive heart disease with heart failure: Secondary | ICD-10-CM | POA: Diagnosis not present

## 2020-01-23 DIAGNOSIS — D6869 Other thrombophilia: Secondary | ICD-10-CM | POA: Diagnosis not present

## 2020-01-23 DIAGNOSIS — J301 Allergic rhinitis due to pollen: Secondary | ICD-10-CM | POA: Diagnosis not present

## 2020-01-23 DIAGNOSIS — G231 Progressive supranuclear ophthalmoplegia [Steele-Richardson-Olszewski]: Secondary | ICD-10-CM | POA: Diagnosis not present

## 2020-01-23 DIAGNOSIS — I700 Atherosclerosis of aorta: Secondary | ICD-10-CM | POA: Diagnosis not present

## 2020-01-23 DIAGNOSIS — R251 Tremor, unspecified: Secondary | ICD-10-CM | POA: Diagnosis not present

## 2020-01-23 DIAGNOSIS — J449 Chronic obstructive pulmonary disease, unspecified: Secondary | ICD-10-CM | POA: Diagnosis not present

## 2020-01-23 DIAGNOSIS — I482 Chronic atrial fibrillation, unspecified: Secondary | ICD-10-CM | POA: Diagnosis not present

## 2020-01-23 DIAGNOSIS — J411 Mucopurulent chronic bronchitis: Secondary | ICD-10-CM | POA: Diagnosis not present

## 2020-01-24 ENCOUNTER — Other Ambulatory Visit: Payer: Self-pay

## 2020-01-24 ENCOUNTER — Other Ambulatory Visit (INDEPENDENT_AMBULATORY_CARE_PROVIDER_SITE_OTHER): Payer: Medicare HMO

## 2020-01-24 DIAGNOSIS — E871 Hypo-osmolality and hyponatremia: Secondary | ICD-10-CM | POA: Diagnosis not present

## 2020-01-24 LAB — SODIUM: Sodium: 135 mEq/L (ref 135–145)

## 2020-02-03 ENCOUNTER — Other Ambulatory Visit: Payer: Self-pay | Admitting: Internal Medicine

## 2020-02-03 DIAGNOSIS — G4733 Obstructive sleep apnea (adult) (pediatric): Secondary | ICD-10-CM | POA: Diagnosis not present

## 2020-03-01 DIAGNOSIS — I700 Atherosclerosis of aorta: Secondary | ICD-10-CM | POA: Diagnosis not present

## 2020-03-01 DIAGNOSIS — I951 Orthostatic hypotension: Secondary | ICD-10-CM | POA: Diagnosis not present

## 2020-03-01 DIAGNOSIS — I10 Essential (primary) hypertension: Secondary | ICD-10-CM | POA: Diagnosis not present

## 2020-03-01 DIAGNOSIS — J410 Simple chronic bronchitis: Secondary | ICD-10-CM | POA: Diagnosis not present

## 2020-03-01 DIAGNOSIS — I480 Paroxysmal atrial fibrillation: Secondary | ICD-10-CM | POA: Diagnosis not present

## 2020-03-01 DIAGNOSIS — I2510 Atherosclerotic heart disease of native coronary artery without angina pectoris: Secondary | ICD-10-CM | POA: Diagnosis not present

## 2020-03-01 DIAGNOSIS — R0602 Shortness of breath: Secondary | ICD-10-CM | POA: Diagnosis not present

## 2020-03-01 DIAGNOSIS — R002 Palpitations: Secondary | ICD-10-CM | POA: Diagnosis not present

## 2020-03-01 DIAGNOSIS — E7800 Pure hypercholesterolemia, unspecified: Secondary | ICD-10-CM | POA: Diagnosis not present

## 2020-03-01 DIAGNOSIS — I429 Cardiomyopathy, unspecified: Secondary | ICD-10-CM | POA: Diagnosis not present

## 2020-03-05 ENCOUNTER — Encounter: Payer: Self-pay | Admitting: Internal Medicine

## 2020-03-05 ENCOUNTER — Ambulatory Visit (INDEPENDENT_AMBULATORY_CARE_PROVIDER_SITE_OTHER): Payer: Medicare HMO | Admitting: Internal Medicine

## 2020-03-05 ENCOUNTER — Other Ambulatory Visit: Payer: Self-pay

## 2020-03-05 DIAGNOSIS — I429 Cardiomyopathy, unspecified: Secondary | ICD-10-CM

## 2020-03-05 DIAGNOSIS — E7800 Pure hypercholesterolemia, unspecified: Secondary | ICD-10-CM

## 2020-03-05 DIAGNOSIS — R739 Hyperglycemia, unspecified: Secondary | ICD-10-CM

## 2020-03-05 DIAGNOSIS — G231 Progressive supranuclear ophthalmoplegia [Steele-Richardson-Olszewski]: Secondary | ICD-10-CM

## 2020-03-05 DIAGNOSIS — J411 Mucopurulent chronic bronchitis: Secondary | ICD-10-CM | POA: Diagnosis not present

## 2020-03-05 DIAGNOSIS — I2510 Atherosclerotic heart disease of native coronary artery without angina pectoris: Secondary | ICD-10-CM

## 2020-03-05 DIAGNOSIS — I700 Atherosclerosis of aorta: Secondary | ICD-10-CM

## 2020-03-05 DIAGNOSIS — D508 Other iron deficiency anemias: Secondary | ICD-10-CM | POA: Diagnosis not present

## 2020-03-05 DIAGNOSIS — I10 Essential (primary) hypertension: Secondary | ICD-10-CM

## 2020-03-05 DIAGNOSIS — I482 Chronic atrial fibrillation, unspecified: Secondary | ICD-10-CM | POA: Diagnosis not present

## 2020-03-05 DIAGNOSIS — G4733 Obstructive sleep apnea (adult) (pediatric): Secondary | ICD-10-CM | POA: Diagnosis not present

## 2020-03-05 MED ORDER — BREO ELLIPTA 200-25 MCG/INH IN AEPB: 1.0000 | INHALATION_SPRAY | Freq: Every day | RESPIRATORY_TRACT | 2 refills | Status: DC

## 2020-03-05 NOTE — Progress Notes (Signed)
[name]: Tyler Stout, male   DOB: [birth year], 87 y.o.   MRN: [id number]   Subjective:    [name]: Tyler Stout, male    DOB: 10-26-1925, 87 y.o.   MRN: [id number]  HPI This visit occurred during the SARS-CoV-2 public health emergency.  Safety protocols were in place, including screening questions prior to the visit, additional usage of staff PPE, and extensive cleaning of exam room while observing appropriate contact time as indicated for disinfecting solutions.  Patient here for a scheduled follow up. He is accompanied by his daughter.  History obtained from both of them.  Saw cardiology 03/01/20.  Has been weighing himself daily.  Outside weights reviewed.  Taking lasix q third day - per cardiology.  They are adjusting.  No chest pain.  Breathing stable.  Eating.  No abdominal pain.  Bowels stale.  Blood pressures reviewed - 113-140s/70-80 - most range.     Past Medical History:  Diagnosis Date  . Anemia    iron deficiency  . Arthritis   . Atrial fibrillation ([place])   . Bilateral renal cysts    worked up by Dr Bernardo Heater  . BPH (benign prostatic hypertrophy)   . CAD (coronary artery disease)    s/p bypass graft x 4 (1991)  . Diverticulosis   . GERD (gastroesophageal reflux disease)   . GI bleed   . Glaucoma   . Hypercholesterolemia   . Hypertension   . Pancreatitis    x2 (1994 and 1998), presumed to be gallstone pancreatitis.  s/p ERCP and sphincterotomy   Past Surgical History:  Procedure Laterality Date  . APPENDECTOMY    . CHOLECYSTECTOMY  1994  . COLONOSCOPY N/A 07/14/2019   Procedure: COLONOSCOPY;  Surgeon: Virgel Manifold, MD;  Location: [facility] ENDOSCOPY;  Service: Endoscopy;  Laterality: N/A;  . CORONARY ARTERY BYPASS GRAFT     x 4,  1991  . EMBOLIZATION Right 05/25/2019   Procedure: EMBOLIZATION (Right Colon);  Surgeon: Katha Cabal, MD;  Location: [facility];  Service: Cardiovascular;  Laterality: Right;  . INGUINAL HERNIA REPAIR  2009  .  open heart surgery  1991   Family History  Problem Relation Age of Onset  . Throat cancer Father        oropharyngeal cancer  . Kidney disease Sister   . Heart disease Other   . Colon cancer Neg Hx   . Prostate cancer Neg Hx    Social History   Socioeconomic History  . Marital status: Married    Spouse name: Not on file  . Number of children: Not on file  . Years of education: Not on file  . Highest education level: Not on file  Occupational History  . Not on file  Tobacco Use  . Smoking status: Former Smoker    Packs/day: 2.00    Years: 25.00    Pack years: 50.00    Types: Cigarettes    Quit date: 01/14/1964    Years since quitting: 56.1  . Smokeless tobacco: Never Used  . Tobacco comment: smoked 2 packs/day, 1 pipe and 1 cigar once in a while.  Substance and Sexual Activity  . Alcohol use: No    Alcohol/week: 0.0 standard drinks  . Drug use: No  . Sexual activity: Not on file  Other Topics Concern  . Not on file  Social History Narrative  . Not on file   Social Determinants of Health   Financial Resource Strain: Low Risk   .  Difficulty of Paying Living Expenses: Not hard at all  Food Insecurity: No Food Insecurity  . Worried About Charity fundraiser in the Last Year: Never true  . Ran Out of Food in the Last Year: Never true  Transportation Needs: No Transportation Needs  . Lack of Transportation (Medical): No  . Lack of Transportation (Non-Medical): No  Physical Activity: Insufficiently Active  . Days of Exercise per Week: 1 day  . Minutes of Exercise per Session: 60 min  Stress: No Stress Concern Present  . Feeling of Stress : Not at all  Social Connections: Unknown  . Frequency of Communication with Friends and Family: Not on file  . Frequency of Social Gatherings with Friends and Family: More than three times a week  . Attends Religious Services: Not on file  . Active Member of Clubs or Organizations: Not on file  . Attends Archivist  Meetings: Not on file  . Marital Status: Married    Outpatient Encounter Medications as of 03/05/2020  Medication Sig  . albuterol (VENTOLIN HFA) 108 (90 Base) MCG/ACT inhaler Inhale 2 puffs into the lungs every 6 (six) hours as needed for wheezing or shortness of breath.  Marland Kitchen ascorbic acid (VITAMIN C) 250 MG CHEW Chew 250 mg by mouth daily.  Marland Kitchen aspirin EC 81 MG tablet Take 81 mg by mouth at bedtime.   Marland Kitchen atorvastatin (LIPITOR) 20 MG tablet TAKE 1 TABLET (20 MG TOTAL) BY MOUTH DAILY.  Marland Kitchen azelastine (ASTELIN) 0.1 % nasal spray PLACE 1 SPRAY INTO BOTH NOSTRILS 2 (TWO) TIMES DAILY (Patient taking differently: Place 1 spray into both nostrils 2 (two) times daily as needed for rhinitis or allergies. )  . BREO ELLIPTA 200-25 MCG/INH AEPB Inhale 1 puff into the lungs daily.  . carbidopa-levodopa (SINEMET CR) 50-200 MG tablet Take 1 tablet by mouth at bedtime.  . carbidopa-levodopa (SINEMET IR) 25-100 MG tablet Take 2 tablets by mouth 3 (three) times daily.  . Cholecalciferol (VITAMIN D) 50 MCG (2000 UT) tablet Take 2,000 Units by mouth every evening.   . citalopram (CELEXA) 20 MG tablet TAKE 1 TABLET (20 MG TOTAL) BY MOUTH DAILY.  . fluticasone (FLONASE) 50 MCG/ACT nasal spray SPRAY 2 SPRAYS INTO EACH NOSTRIL EVERY DAY (Patient taking differently: Place 2 sprays into both nostrils daily as needed for allergies or rhinitis. )  . furosemide (LASIX) 20 MG tablet Take 20 mg by mouth 2 (two) times a week. (take on Monday and Thursday)  . guaiFENesin (MUCINEX) 600 MG 12 hr tablet Take 600 mg by mouth 2 (two) times daily.  Marland Kitchen latanoprost (XALATAN) 0.005 % ophthalmic solution Place 1 drop into both eyes at bedtime.   . metoprolol tartrate (LOPRESSOR) 25 MG tablet Take 25 mg by mouth 2 (two) times daily.   . Multiple Vitamin (MULTIVITAMIN WITH MINERALS) TABS tablet Take 1 tablet by mouth every evening.   . potassium chloride (KLOR-CON) 10 MEQ tablet Take one tablet by mouth with lasix. (Patient taking differently:  Take 10 mEq by mouth 2 (two) times a week. (take with furosemide))  . tamsulosin (FLOMAX) 0.4 MG CAPS capsule TAKE 1 CAPSULE BY MOUTH EVERY DAY  . telmisartan (MICARDIS) 20 MG tablet Take 20 mg by mouth daily.  . [DISCONTINUED] BREO ELLIPTA 200-25 MCG/INH AEPB Inhale 1 puff into the lungs daily.  . [DISCONTINUED] pantoprazole (PROTONIX) 40 MG tablet TAKE 1 TABLET BY MOUTH EVERY DAY (Patient taking differently: Take 40 mg by mouth daily. )   No facility-administered encounter  medications on file as of 03/05/2020.    Review of Systems  Constitutional: Negative for appetite change and unexpected weight change.  HENT: Negative for congestion and sinus pressure.   Respiratory: Negative for cough and chest tightness.        Breathing stable.   Cardiovascular: Negative for chest pain and palpitations.       No increased swelling.   Gastrointestinal: Negative for abdominal pain, diarrhea, nausea and vomiting.  Genitourinary: Negative for difficulty urinating and dysuria.  Musculoskeletal: Negative for joint swelling and myalgias.  Neurological: Negative for headaches.       Chronic dizziness.    Psychiatric/Behavioral: Negative for agitation and dysphoric mood.       Objective:    Physical Exam Constitutional:      General: He is not in acute distress.    Appearance: Normal appearance. He is well-developed and well-nourished.  HENT:     Head: Normocephalic and atraumatic.     Right Ear: External ear normal.     Left Ear: External ear normal.  Eyes:     General: No scleral icterus.       Right eye: No discharge.        Left eye: No discharge.     Conjunctiva/sclera: Conjunctivae normal.  Cardiovascular:     Rate and Rhythm: Normal rate and regular rhythm.  Pulmonary:     Effort: Pulmonary effort is normal. No respiratory distress.     Breath sounds: Normal breath sounds.  Abdominal:     General: Bowel sounds are normal.     Palpations: Abdomen is soft.     Tenderness: There is  no abdominal tenderness.  Musculoskeletal:        General: No swelling, tenderness or edema.     Cervical back: Neck supple. No tenderness.  Lymphadenopathy:     Cervical: No cervical adenopathy.  Skin:    Findings: No erythema or rash.  Neurological:     Mental Status: He is alert.  Psychiatric:        Mood and Affect: Mood and affect and mood normal.        Behavior: Behavior normal.     BP 112/60   Pulse (!) 59   Temp (!) 97.2 F (36.2 C) (Oral)   Resp 16   Ht '5\' 5"'  (1.651 m)   Wt 139 lb 9.6 oz (63.3 kg)   SpO2 99%   BMI 23.23 kg/m  Wt Readings from Last 3 Encounters:  03/05/20 139 lb 9.6 oz (63.3 kg)  01/03/20 134 lb (60.8 kg)  11/29/19 134 lb (60.8 kg)     Lab Results  Component Value Date   WBC 5.6 11/29/2019   HGB 11.7 (L) 11/29/2019   HCT 35.3 (L) 11/29/2019   PLT 211.0 11/29/2019   GLUCOSE 94 11/29/2019   CHOL 111 11/29/2019   TRIG 88.0 11/29/2019   HDL 48.50 11/29/2019   LDLCALC 45 11/29/2019   ALT 7 11/29/2019   AST 20 11/29/2019   NA 135 01/24/2020   K 4.4 11/29/2019   CL 99 11/29/2019   CREATININE 0.77 11/29/2019   BUN 16 11/29/2019   CO2 28 11/29/2019   TSH 1.06 04/28/2019   INR 1.2 05/25/2019   HGBA1C 5.9 11/29/2019    NM GI Blood Loss  Result Date: 07/12/2019 CLINICAL DATA:  Lower GI bleeding. EXAM: NUCLEAR MEDICINE GASTROINTESTINAL BLEEDING SCAN TECHNIQUE: Sequential abdominal images were obtained following intravenous administration of Tc-5mlabeled red blood cells. RADIOPHARMACEUTICALS:  24.46 mCi Tc-978m  pertechnetate in-vitro labeled red cells. COMPARISON:  May 25, 2019 FINDINGS: There is normal distribution of radiotracer on the provided images. Radiotracer is seen accumulating in the urinary bladder. There is radiotracer projecting over the low midline pelvis which may be penile in location and is similar to the patient's prior study from May 25, 2019. There is no evidence for active GI bleed on this study. IMPRESSION: No evidence for  active GI bleeding. Electronically Signed   By: Constance Holster M.D.   On: 07/12/2019 18:32       Assessment & Plan:   Problem List Items Addressed This Visit    Anemia    Follow cbc.       Relevant Orders   CBC with Differential/Platelet   Vitamin B12   IBC + Ferritin   CAD (coronary artery disease)    Followed by cardiology.  Stable.      Cardiomyopathy ([place])    No evidence of volume overload on exam. Weighing regularly.  Lasix prn.  Follow.        Chronic atrial fibrillation ([facility])    Off coumadin secondary to GI bleeds.  On metoprolol.  Rate controlled.  Follow.        Chronic bronchitis ([place])    Has been evaluated by pulmonary.  Breathing stable.        Hypercholesteremia    On lipitor.  Low cholesterol diet and exercise.  Follow lipid panel and liver function tests.        Relevant Orders   Lipid panel   TSH   Hepatic function panel   Hyperglycemia    Follow met b and a1c.       Relevant Orders   Hemoglobin A1c   Hypertension    Continue micardis and metoprolol.  Taking lasix as directed.  Blood pressure as outlined. Continue current medication.        Relevant Orders   Basic metabolic panel   Progressive supranuclear palsy ([place])    Followed by neurology.  Has f/u planned this week.       Thoracic aortic atherosclerosis ([facility])    Continue lipitor.  Off aspirin due to recurring GI bleed.            [name], MD

## 2020-03-08 DIAGNOSIS — G231 Progressive supranuclear ophthalmoplegia [Steele-Richardson-Olszewski]: Secondary | ICD-10-CM | POA: Diagnosis not present

## 2020-03-08 DIAGNOSIS — Z9189 Other specified personal risk factors, not elsewhere classified: Secondary | ICD-10-CM | POA: Diagnosis not present

## 2020-03-08 DIAGNOSIS — R296 Repeated falls: Secondary | ICD-10-CM | POA: Diagnosis not present

## 2020-03-10 ENCOUNTER — Encounter: Payer: Self-pay | Admitting: Internal Medicine

## 2020-03-10 NOTE — Assessment & Plan Note (Signed)
Followed by cardiology. Stable.   

## 2020-03-10 NOTE — Assessment & Plan Note (Signed)
No evidence of volume overload on exam.  Weighing regularly.  Lasix prn.  Follow.  

## 2020-03-10 NOTE — Assessment & Plan Note (Signed)
Follow cbc.  

## 2020-03-10 NOTE — Assessment & Plan Note (Signed)
Follow met b and a1c.  

## 2020-03-10 NOTE — Assessment & Plan Note (Signed)
Continue lipitor.  Off aspirin due to recurring GI bleed.

## 2020-03-10 NOTE — Assessment & Plan Note (Signed)
Followed by neurology.  Has f/u planned this week.

## 2020-03-10 NOTE — Assessment & Plan Note (Signed)
Continue micardis and metoprolol.  Taking lasix as directed.  Blood pressure as outlined. Continue current medication.

## 2020-03-10 NOTE — Assessment & Plan Note (Signed)
On lipitor.  Low cholesterol diet and exercise.  Follow lipid panel and liver function tests.   

## 2020-03-10 NOTE — Assessment & Plan Note (Signed)
Has been evaluated by pulmonary.  Breathing stable.  

## 2020-03-10 NOTE — Assessment & Plan Note (Signed)
Off coumadin secondary to GI bleeds.  On metoprolol.  Rate controlled.  Follow.

## 2020-03-12 DIAGNOSIS — H401132 Primary open-angle glaucoma, bilateral, moderate stage: Secondary | ICD-10-CM | POA: Diagnosis not present

## 2020-03-12 DIAGNOSIS — H02055 Trichiasis without entropian left lower eyelid: Secondary | ICD-10-CM | POA: Diagnosis not present

## 2020-03-13 ENCOUNTER — Emergency Department
Admission: EM | Admit: 2020-03-13 | Discharge: 2020-03-13 | Disposition: A | Discharge status: Home / Self Care | Payer: Medicare HMO | Attending: Emergency Medicine | Admitting: Emergency Medicine

## 2020-03-13 ENCOUNTER — Emergency Department: Payer: Medicare HMO

## 2020-03-13 ENCOUNTER — Other Ambulatory Visit: Payer: Self-pay

## 2020-03-13 ENCOUNTER — Encounter: Payer: Self-pay | Admitting: Radiology

## 2020-03-13 DIAGNOSIS — S0990XA Unspecified injury of head, initial encounter: Secondary | ICD-10-CM | POA: Diagnosis not present

## 2020-03-13 DIAGNOSIS — Z79899 Other long term (current) drug therapy: Secondary | ICD-10-CM | POA: Insufficient documentation

## 2020-03-13 DIAGNOSIS — Z87891 Personal history of nicotine dependence: Secondary | ICD-10-CM | POA: Insufficient documentation

## 2020-03-13 DIAGNOSIS — R220 Localized swelling, mass and lump, head: Secondary | ICD-10-CM | POA: Diagnosis not present

## 2020-03-13 DIAGNOSIS — Z043 Encounter for examination and observation following other accident: Secondary | ICD-10-CM | POA: Diagnosis not present

## 2020-03-13 DIAGNOSIS — S0101XA Laceration without foreign body of scalp, initial encounter: Secondary | ICD-10-CM | POA: Insufficient documentation

## 2020-03-13 DIAGNOSIS — Z951 Presence of aortocoronary bypass graft: Secondary | ICD-10-CM | POA: Diagnosis not present

## 2020-03-13 DIAGNOSIS — W19XXXA Unspecified fall, initial encounter: Secondary | ICD-10-CM | POA: Insufficient documentation

## 2020-03-13 DIAGNOSIS — I2510 Atherosclerotic heart disease of native coronary artery without angina pectoris: Secondary | ICD-10-CM | POA: Diagnosis not present

## 2020-03-13 DIAGNOSIS — Y939 Activity, unspecified: Secondary | ICD-10-CM | POA: Diagnosis not present

## 2020-03-13 DIAGNOSIS — I10 Essential (primary) hypertension: Secondary | ICD-10-CM | POA: Insufficient documentation

## 2020-03-13 DIAGNOSIS — Y929 Unspecified place or not applicable: Secondary | ICD-10-CM | POA: Diagnosis not present

## 2020-03-13 DIAGNOSIS — Y999 Unspecified external cause status: Secondary | ICD-10-CM | POA: Insufficient documentation

## 2020-03-13 DIAGNOSIS — I4891 Unspecified atrial fibrillation: Secondary | ICD-10-CM | POA: Insufficient documentation

## 2020-03-13 DIAGNOSIS — R0781 Pleurodynia: Secondary | ICD-10-CM | POA: Diagnosis not present

## 2020-03-13 MED ORDER — LIDOCAINE-EPINEPHRINE 2 %-1:100000 IJ SOLN
20.0000 mL | Freq: Once | INTRAMUSCULAR | Status: AC
  Administered 2020-03-13: 20 mL

## 2020-03-13 NOTE — Discharge Instructions (Addendum)
Sutures need to be removed in five days.

## 2020-03-13 NOTE — ED Notes (Signed)
Patient transported to X-ray 

## 2020-03-13 NOTE — ED Notes (Signed)
Patient discharged to home per MD order. Patient in stable condition, and deemed medically cleared by ED provider for discharge. Discharge instructions reviewed with patient/family using "Teach Back"; verbalized understanding of medication education and administration, and information about follow-up care. Denies further concerns. ° °

## 2020-03-13 NOTE — ED Notes (Signed)
ED Provider at bedside. 

## 2020-03-13 NOTE — ED Triage Notes (Addendum)
Wife with pt. Pt fell while walking and hit head on walker. Pt landed on carpet. As per wife, pt did not have LOC. Pt does take 1 baby aspirin a day, no other blood thinners.  Fall was at 4pm today. Wife states the bleeding stopped, they went out to dinner and then the bleeding started again and it would not stop, so they came in.  Pt denies of cervical tenderness on palpation. Pressure applied, new dressing placed.

## 2020-03-13 NOTE — ED Notes (Signed)
PA provider came up to triage and assessed and sutured laceration.

## 2020-03-13 NOTE — ED Notes (Signed)
Small abrasion over prominent vein on left side of forehead with active oozing of blood that does not cease with pressure of application of surgicele; old blood clensed from head--no further injuries noted; pt denies cervical tenderness with palpation and denies any HA or dizziness; Aquilla Solian, PA notified and will see pt in triage to assess area of bleeding

## 2020-03-13 NOTE — ED Provider Notes (Signed)
[facility]-EMERGENCY DEPARTMENT  ____________________________________________  Time seen: Approximately 11:17 PM  I have reviewed the triage vital signs and the nursing notes.   HISTORY  Chief Complaint Fall   Historian Patient     HPI Tyler Stout is a 87 y.o. male presents to the emergency department after a mechanical fall.  Fall was witnessed by patient's wife.  Patient fell from a standing position.  Fall occurred around 4:00 PM and patient was initially observed at home but facial wound started bleeding and wife states that she was not able to control the bleeding at home.  Patient is not on any blood thinners.  Patient has also been complaining of some left-sided rib discomfort.  No chest pain, chest tightness, shortness of breath, nausea, vomiting or abdominal pain.   Past Medical History:  Diagnosis Date  . Anemia    iron deficiency  . Arthritis   . Atrial fibrillation ([place])   . Bilateral renal cysts    worked up by Dr Bernardo Heater  . BPH (benign prostatic hypertrophy)   . CAD (coronary artery disease)    s/p bypass graft x 4 (1991)  . Diverticulosis   . GERD (gastroesophageal reflux disease)   . GI bleed   . Glaucoma   . Hypercholesterolemia   . Hypertension   . Pancreatitis    x2 (1994 and 1998), presumed to be gallstone pancreatitis.  s/p ERCP and sphincterotomy     Immunizations up to date:  Yes.     Past Medical History:  Diagnosis Date  . Anemia    iron deficiency  . Arthritis   . Atrial fibrillation ([place])   . Bilateral renal cysts    worked up by Dr Bernardo Heater  . BPH (benign prostatic hypertrophy)   . CAD (coronary artery disease)    s/p bypass graft x 4 (1991)  . Diverticulosis   . GERD (gastroesophageal reflux disease)   . GI bleed   . Glaucoma   . Hypercholesterolemia   . Hypertension   . Pancreatitis    x2 (1994 and 1998), presumed to be gallstone pancreatitis.  s/p ERCP and sphincterotomy    Patient Active Problem List   Diagnosis  Date Noted  . Cardiomyopathy ([place]) 07/23/2019  . Rectal bleeding 07/12/2019  . History of GI diverticular bleed 07/12/2019  . Rectal bleed 07/12/2019  . GI bleeding 05/25/2019  . Acute lower GI bleeding 05/25/2019  . Lower extremity edema 01/02/2019  . Chronic bronchitis ([place]) 10/10/2018  . Scalp lesion 10/10/2018  . Pneumonia 07/28/2018  . PNA (pneumonia) 05/07/2018  . Cough 05/05/2018  . Hyperglycemia 09/13/2017  . Decreased pedal pulses 04/01/2017  . Dizziness 02/07/2017  . Progressive supranuclear palsy ([place]) 11/02/2016  . Thoracic aortic atherosclerosis ([place]) 08/12/2016  . Abnormal CXR 07/18/2016  . Urinary frequency 06/20/2015  . Lower GI bleed   . Demand ischemia ([place])   . Chronic atrial fibrillation ([place])   . Essential hypertension   . HLD (hyperlipidemia)   . BPH (benign prostatic hyperplasia)   . Tremor 06/10/2015  . Loss of weight 06/10/2015  . GI bleed 06/08/2015  . Health care maintenance 08/11/2014  . Light headedness 03/19/2014  . Back pain 03/13/2013  . Hypokalemia 02/20/2013  . CAD (coronary artery disease) 12/07/2011  . Hyponatremia 12/07/2011  . Atrial fibrillation ([place]) 12/07/2011  . Hypertension 11/20/2011  . Hypercholesteremia 11/20/2011  . Anemia 11/20/2011    Past Surgical History:  Procedure Laterality Date  . APPENDECTOMY    . CHOLECYSTECTOMY  1994  .  COLONOSCOPY N/A 07/14/2019   Procedure: COLONOSCOPY;  Surgeon: Virgel Manifold, MD;  Location: [facility] ENDOSCOPY;  Service: Endoscopy;  Laterality: N/A;  . CORONARY ARTERY BYPASS GRAFT     x 4,  1991  . EMBOLIZATION Right 05/25/2019   Procedure: EMBOLIZATION (Right Colon);  Surgeon: Katha Cabal, MD;  Location: [facility];  Service: Cardiovascular;  Laterality: Right;  . INGUINAL HERNIA REPAIR  2009  . open heart surgery  1991    Prior to Admission medications   Medication Sig Start Date End Date Taking? Authorizing Provider  albuterol (VENTOLIN HFA) 108 (90 Base) MCG/ACT  inhaler Inhale 2 puffs into the lungs every 6 (six) hours as needed for wheezing or shortness of breath.    [provider]  ascorbic acid (VITAMIN C) 250 MG CHEW Chew 250 mg by mouth daily.    [provider]  aspirin EC 81 MG tablet Take 81 mg by mouth at bedtime.     [provider]  atorvastatin (LIPITOR) 20 MG tablet TAKE 1 TABLET (20 MG TOTAL) BY MOUTH DAILY. 02/03/20   [name], MD  azelastine (ASTELIN) 0.1 % nasal spray PLACE 1 SPRAY INTO BOTH NOSTRILS 2 (TWO) TIMES DAILY Patient taking differently: Place 1 spray into both nostrils 2 (two) times daily as needed for rhinitis or allergies.  12/17/18   [name], MD  BREO ELLIPTA 200-25 MCG/INH AEPB Inhale 1 puff into the lungs daily. 03/05/20   [name], MD  carbidopa-levodopa (SINEMET CR) 50-200 MG tablet Take 1 tablet by mouth at bedtime.    [provider]  carbidopa-levodopa (SINEMET IR) 25-100 MG tablet Take 2 tablets by mouth 3 (three) times daily.    [provider]  Cholecalciferol (VITAMIN D) 50 MCG (2000 UT) tablet Take 2,000 Units by mouth every evening.     [provider]  citalopram (CELEXA) 20 MG tablet TAKE 1 TABLET (20 MG TOTAL) BY MOUTH DAILY. 02/03/20   [name], MD  fluticasone (FLONASE) 50 MCG/ACT nasal spray SPRAY 2 SPRAYS INTO EACH NOSTRIL EVERY DAY Patient taking differently: Place 2 sprays into both nostrils daily as needed for allergies or rhinitis.  12/02/18   [name], MD  furosemide (LASIX) 20 MG tablet Take 20 mg by mouth 2 (two) times a week. (take on Monday and Thursday)    [provider]  guaiFENesin (MUCINEX) 600 MG 12 hr tablet Take 600 mg by mouth 2 (two) times daily.    [provider]  latanoprost (XALATAN) 0.005 % ophthalmic solution Place 1 drop into both eyes at bedtime.     [provider]  metoprolol tartrate (LOPRESSOR) 25 MG tablet Take 25 mg by mouth 2 (two) times daily.     [provider]  Multiple Vitamin (MULTIVITAMIN WITH MINERALS) TABS tablet Take 1 tablet by mouth every evening.     [provider]  potassium chloride (KLOR-CON) 10 MEQ tablet Take one tablet by mouth with lasix. Patient taking differently: Take 10 mEq by mouth 2 (two) times a week. (take with furosemide) 01/05/19   [name], MD  tamsulosin (FLOMAX) 0.4 MG CAPS capsule TAKE 1 CAPSULE BY MOUTH EVERY DAY 11/09/19   [name], MD  telmisartan (MICARDIS) 20 MG tablet Take 20 mg by mouth daily. 02/08/19   [provider]    Allergies Ilosone [erythromycin] and Penicillins  Family History  Problem Relation Age of Onset  . Throat cancer Father  oropharyngeal cancer  . Kidney disease Sister   . Heart disease Other   . Colon cancer Neg Hx   . Prostate cancer Neg Hx     Social History Social History   Tobacco Use  . Smoking status: Former Smoker    Packs/day: 2.00    Years: 25.00    Pack years: 50.00    Types: Cigarettes    Quit date: 01/14/1964    Years since quitting: 56.2  . Smokeless tobacco: Never Used  . Tobacco comment: smoked 2 packs/day, 1 pipe and 1 cigar once in a while.  Substance Use Topics  . Alcohol use: No    Alcohol/week: 0.0 standard drinks  . Drug use: No     Review of Systems  Constitutional: No fever/chills Eyes:  No discharge ENT: No upper respiratory complaints. Respiratory: no cough. No SOB/ use of accessory muscles to breath Gastrointestinal:   No nausea, no vomiting.  No diarrhea.  No constipation. Musculoskeletal: Patient has left sided rib pain.  Skin: Patient has facial laceration.    ____________________________________________   PHYSICAL EXAM:  VITAL SIGNS: ED Triage Vitals  Enc Vitals Group     BP 03/13/20 2014 (!) 168/75     Pulse Rate 03/13/20 2014 73     Resp 03/13/20 2014 16     Temp 03/13/20 2014 (!) 97.4 F (36.3 C)     Temp src --      SpO2 03/13/20 2014 98 %     Weight 03/13/20 2016 124  lb (56.2 kg)     Height 03/13/20 2016 5\' 5"  (1.651 m)     Head Circumference --      Peak Flow --      Pain Score 03/13/20 2016 0     Pain Loc --      Pain Edu? --      Excl. in [place]? --      Constitutional: Alert and oriented. Well appearing and in no acute distress. Eyes: Conjunctivae are normal. PERRL. EOMI. Head: Atraumatic. ENT:      Nose: No congestion/rhinnorhea.      Mouth/Throat: Mucous membranes are moist.  Neck: No stridor.  No cervical spine tenderness to palpation.  Cardiovascular: Normal rate, regular rhythm. Normal S1 and S2.  Good peripheral circulation. Respiratory: Normal respiratory effort without tachypnea or retractions. Lungs CTAB. Good air entry to the bases with no decreased or absent breath sounds Gastrointestinal: Bowel sounds x 4 quadrants. Soft and nontender to palpation. No guarding or rigidity. No distention. Musculoskeletal: Full range of motion to all extremities. No obvious deformities noted. Patient has some mild left sided rib tenderness to palpation.  Neurologic:  Normal for age. No gross focal neurologic deficits are appreciated.  Skin:  Skin is warm, dry and intact. No rash noted. Psychiatric: Mood and affect are normal for age. Speech and behavior are normal.   ____________________________________________   LABS (all labs ordered are listed, but only abnormal results are displayed)  Labs Reviewed - No data to display ____________________________________________  EKG   ____________________________________________  RADIOLOGY Unk Pinto, personally viewed and evaluated these images (plain radiographs) as part of my medical decision making, as well as reviewing the written report by the radiologist.  DG Ribs Unilateral W/Chest Left  Result Date: 03/13/2020 CLINICAL DATA:  Left lateral rib pain after fall EXAM: LEFT RIBS AND CHEST - 3+ VIEW COMPARISON:  01/04/2019 FINDINGS: Frontal view of the chest as well as frontal and oblique views  of the  left thoracic cage are obtained. Cardiac silhouette is unremarkable. Postsurgical changes from CABG. No acute airspace disease, effusion, or pneumothorax. Pleural calcification left hemidiaphragm unchanged. No acute displaced fracture. IMPRESSION: 1. No acute intrathoracic process.  No acute bony abnormality. Electronically Signed   By: Randa Ngo M.D.   On: 03/13/2020 23:05   CT Head Wo Contrast  Result Date: 03/13/2020 CLINICAL DATA:  Status post fall. EXAM: CT HEAD WITHOUT CONTRAST TECHNIQUE: Contiguous axial images were obtained from the base of the skull through the vertex without intravenous contrast. COMPARISON:  March 06, 2015 FINDINGS: Brain: There is moderate severity cerebral atrophy with widening of the extra-axial spaces and ventricular dilatation. There are areas of decreased attenuation within the white matter tracts of the supratentorial brain, consistent with microvascular disease changes. Vascular: No hyperdense vessel or unexpected calcification. Skull: Normal. Negative for fracture or focal lesion. Sinuses/Orbits: No acute finding. Other: There is mild left frontal scalp soft tissue swelling. IMPRESSION: 1. Mild left frontal scalp soft tissue swelling without evidence of an acute fracture or acute intracranial abnormality. 2. Moderate severity cerebral atrophy and microvascular disease changes of the supratentorial brain. Electronically Signed   By: Virgina Norfolk M.D.   On: 03/13/2020 21:22   CT Cervical Spine Wo Contrast  Result Date: 03/13/2020 CLINICAL DATA:  Status post fall. EXAM: CT CERVICAL SPINE WITHOUT CONTRAST TECHNIQUE: Multidetector CT imaging of the cervical spine was performed without intravenous contrast. Multiplanar CT image reconstructions were also generated. COMPARISON:  None. FINDINGS: Alignment: 1 mm to 2 mm retrolisthesis of the C4 vertebral body is noted on C5. Skull base and vertebrae: No acute fracture. No primary bone lesion or focal pathologic  process. Soft tissues and spinal canal: No prevertebral fluid or swelling. No visible canal hematoma. Disc levels: Moderate severity endplate sclerosis is seen at the levels of C4-C5, C5-C6, C6-C7 and C7-T1. Marked severity narrowing of the anterior atlantoaxial articulation is seen. Marked severity intervertebral disc space narrowing is also seen at the levels of C4-C5, C5-C6, C6-C7 and C7-T1. Mild, bilateral multilevel facet joint hypertrophy is noted. Upper chest: Negative. Mild biapical scarring and/or atelectasis is seen. Other: None. IMPRESSION: 1. Marked severity multilevel degenerative changes, most prominent at the levels of C4-C5, C5-C6, C6-C7 and C7-T1. 2. 1 mm to 2 mm retrolisthesis of the C4 vertebral body on C5. 3. No acute cervical spine fracture. Electronically Signed   By: Virgina Norfolk M.D.   On: 03/13/2020 21:26    ____________________________________________    PROCEDURES  Procedure(s) performed:     Marland KitchenMarland KitchenLaceration Repair  Date/Time: 03/13/2020 11:20 PM Performed by: Lannie Fields, PA-C Authorized by: Lannie Fields, PA-C   Consent:    Consent obtained:  Verbal Universal protocol:    Procedure explained and questions answered to patient or proxy's satisfaction: yes     Patient identity confirmed:  Verbally with patient Laceration details:    Location:  Scalp   Length (cm):  2   Depth (mm):  5 Treatment:    Area cleansed with:  Povidone-iodine   Visualized foreign bodies/material removed: no     Debridement:  None Skin repair:    Repair method:  Sutures   Suture size:  4-0   Suture material:  Nylon   Number of sutures:  5 Repair type:    Repair type:  Simple Post-procedure details:    Dressing:  Non-adherent dressing       Medications  lidocaine-EPINEPHrine (XYLOCAINE W/EPI) 2 %-1:100000 (with pres) injection 20 mL (20 mLs  Infiltration Given by Other 03/13/20 2059)     ____________________________________________   INITIAL IMPRESSION /  ASSESSMENT AND PLAN / ED COURSE  Pertinent labs & imaging results that were available during my care of the patient were reviewed by me and considered in my medical decision making (see chart for details).     Assessment and Plan:  Fall:  87 year old male presents to the emergency department after a mechanical fall from standing height that occurred at 4:00 PM  Vital signs are reassuring at triage.  On exam, patient was alert and oriented and was able to answer questions.  Patient was accompanied by his wife who corroborated history.  CT head revealed no evidence of intracranial bleed or skull fracture.  Laceration along distal scalp/proximal forehead was repaired in the emergency department without complication and patient's wife was advised to have sutures removed in 5 days.  Dedicated x-rays of the left ribs revealed no evidence of acute rib fracture or pneumothorax.  Patient education regarding wound care was given.  Tylenol was recommended for discomfort.  Return precautions were given to return with new or worsening symptoms.     ____________________________________________  FINAL CLINICAL IMPRESSION(S) / ED DIAGNOSES  Final diagnoses:  Fall, initial encounter      NEW MEDICATIONS STARTED DURING THIS VISIT:  ED Discharge Orders    None          This chart was dictated using voice recognition software/Dragon. Despite best efforts to proofread, errors can occur which can change the meaning. Any change was purely unintentional.     Lannie Fields, PA-C 03/13/20 2322    Vanessa Refugio, MD 03/14/20 [phone]

## 2020-03-13 NOTE — ED Notes (Signed)
New bandaged placed on pts head. Pt to CT with CT tech

## 2020-03-13 NOTE — ED Notes (Addendum)
Pt presents via triage for c/o L upper forehead laceration after he fell from standing at 1600. Wife sts she was in the room when patient fell. Sts the patient had no LOC or N/V and that she was able to control bleeding initially. Wife sts at dinner wound started bleeding again and they were not able to control bleeding.  Pt takes ASA; no other blood thinners.   Wound sutured and dressed in triage.    Pt also notes L rib pain. Superficial abrasion present.

## 2020-03-20 ENCOUNTER — Other Ambulatory Visit: Payer: Self-pay

## 2020-03-20 ENCOUNTER — Ambulatory Visit (INDEPENDENT_AMBULATORY_CARE_PROVIDER_SITE_OTHER): Payer: Medicare HMO | Admitting: Internal Medicine

## 2020-03-20 DIAGNOSIS — I10 Essential (primary) hypertension: Secondary | ICD-10-CM | POA: Diagnosis not present

## 2020-03-20 DIAGNOSIS — G231 Progressive supranuclear ophthalmoplegia [Steele-Richardson-Olszewski]: Secondary | ICD-10-CM

## 2020-03-20 DIAGNOSIS — S0181XD Laceration without foreign body of other part of head, subsequent encounter: Secondary | ICD-10-CM

## 2020-03-20 DIAGNOSIS — W19XXXD Unspecified fall, subsequent encounter: Secondary | ICD-10-CM | POA: Diagnosis not present

## 2020-03-20 NOTE — Progress Notes (Signed)
[name]: Tyler Stout, male   DOB: [birth year], 87 y.o.   MRN: [id number]   Subjective:    [name]: Tyler Stout, male    DOB: 01/03/1926, 87 y.o.   MRN: [id number]  HPI This visit occurred during the SARS-CoV-2 public health emergency.  Safety protocols were in place, including screening questions prior to the visit, additional usage of staff PPE, and extensive cleaning of exam room while observing appropriate contact time as indicated for disinfecting solutions.  Patient here for work in appt to have sutures removed.  Was seen in ER 03/13/20 after a mechanical fall.  No LOC. CT head - no evidence of intracranial bleed or skull fracture.  Laceration along distal scalp/proximal forehead - sutured. Dedicated xrays of the left ribs revealed no evidence of acute rib fracture or pneumothorax.  Since his fall, he has done relatively well.  No headache.  No further falls. Still with some left rib pain.  No pain with deep breathing.  No increased cough or congestion. Eating.    Past Medical History:  Diagnosis Date  . Anemia    iron deficiency  . Arthritis   . Atrial fibrillation ([place])   . Bilateral renal cysts    worked up by Dr Bernardo Heater  . BPH (benign prostatic hypertrophy)   . CAD (coronary artery disease)    s/p bypass graft x 4 (1991)  . Diverticulosis   . GERD (gastroesophageal reflux disease)   . GI bleed   . Glaucoma   . Hypercholesterolemia   . Hypertension   . Pancreatitis    x2 (1994 and 1998), presumed to be gallstone pancreatitis.  s/p ERCP and sphincterotomy   Past Surgical History:  Procedure Laterality Date  . APPENDECTOMY    . CHOLECYSTECTOMY  1994  . COLONOSCOPY N/A 07/14/2019   Procedure: COLONOSCOPY;  Surgeon: Virgel Manifold, MD;  Location: [facility];  Service: Endoscopy;  Laterality: N/A;  . CORONARY ARTERY BYPASS GRAFT     x 4,  1991  . EMBOLIZATION Right 05/25/2019   Procedure: EMBOLIZATION (Right Colon);  Surgeon: Katha Cabal, MD;   Location: [facility];  Service: Cardiovascular;  Laterality: Right;  . INGUINAL HERNIA REPAIR  2009  . open heart surgery  1991   Family History  Problem Relation Age of Onset  . Throat cancer Father        oropharyngeal cancer  . Kidney disease Sister   . Heart disease Other   . Colon cancer Neg Hx   . Prostate cancer Neg Hx    Social History   Socioeconomic History  . Marital status: Married    Spouse name: Not on file  . Number of children: Not on file  . Years of education: Not on file  . Highest education level: Not on file  Occupational History  . Not on file  Tobacco Use  . Smoking status: Former Smoker    Packs/day: 2.00    Years: 25.00    Pack years: 50.00    Types: Cigarettes    Quit date: 01/14/1964    Years since quitting: 56.2  . Smokeless tobacco: Never Used  . Tobacco comment: smoked 2 packs/day, 1 pipe and 1 cigar once in a while.  Substance and Sexual Activity  . Alcohol use: No    Alcohol/week: 0.0 standard drinks  . Drug use: No  . Sexual activity: Not on file  Other Topics Concern  . Not on file  Social History Narrative  .  Not on file   Social Determinants of Health   Financial Resource Strain: Low Risk   . Difficulty of Paying Living Expenses: Not hard at all  Food Insecurity: No Food Insecurity  . Worried About Charity fundraiser in the Last Year: Never true  . Ran Out of Food in the Last Year: Never true  Transportation Needs: No Transportation Needs  . Lack of Transportation (Medical): No  . Lack of Transportation (Non-Medical): No  Physical Activity: Insufficiently Active  . Days of Exercise per Week: 1 day  . Minutes of Exercise per Session: 60 min  Stress: No Stress Concern Present  . Feeling of Stress : Not at all  Social Connections: Unknown  . Frequency of Communication with Friends and Family: Not on file  . Frequency of Social Gatherings with Friends and Family: More than three times a week  . Attends Religious  Services: Not on file  . Active Member of Clubs or Organizations: Not on file  . Attends Archivist Meetings: Not on file  . Marital Status: Married    Outpatient Encounter Medications as of 03/20/2020  Medication Sig  . rivastigmine (EXELON) 4.6 mg/24hr Place 1 patch onto the skin daily.  Marland Kitchen albuterol (VENTOLIN HFA) 108 (90 Base) MCG/ACT inhaler Inhale 2 puffs into the lungs every 6 (six) hours as needed for wheezing or shortness of breath.  Marland Kitchen ascorbic acid (VITAMIN C) 250 MG CHEW Chew 250 mg by mouth daily.  Marland Kitchen aspirin EC 81 MG tablet Take 81 mg by mouth at bedtime.   Marland Kitchen atorvastatin (LIPITOR) 20 MG tablet TAKE 1 TABLET (20 MG TOTAL) BY MOUTH DAILY.  Marland Kitchen azelastine (ASTELIN) 0.1 % nasal spray PLACE 1 SPRAY INTO BOTH NOSTRILS 2 (TWO) TIMES DAILY (Patient taking differently: Place 1 spray into both nostrils 2 (two) times daily as needed for rhinitis or allergies. )  . BREO ELLIPTA 200-25 MCG/INH AEPB Inhale 1 puff into the lungs daily.  . carbidopa-levodopa (SINEMET CR) 50-200 MG tablet Take 1 tablet by mouth at bedtime.  . carbidopa-levodopa (SINEMET IR) 25-100 MG tablet Take 2 tablets by mouth 3 (three) times daily.  . Cholecalciferol (VITAMIN D) 50 MCG (2000 UT) tablet Take 2,000 Units by mouth every evening.   . citalopram (CELEXA) 20 MG tablet TAKE 1 TABLET (20 MG TOTAL) BY MOUTH DAILY.  . fluticasone (FLONASE) 50 MCG/ACT nasal spray SPRAY 2 SPRAYS INTO EACH NOSTRIL EVERY DAY (Patient taking differently: Place 2 sprays into both nostrils daily as needed for allergies or rhinitis. )  . furosemide (LASIX) 20 MG tablet Take 20 mg by mouth 2 (two) times a week. (take on Monday and Thursday)  . guaiFENesin (MUCINEX) 600 MG 12 hr tablet Take 600 mg by mouth 2 (two) times daily.  Marland Kitchen latanoprost (XALATAN) 0.005 % ophthalmic solution Place 1 drop into both eyes at bedtime.   . metoprolol tartrate (LOPRESSOR) 25 MG tablet Take 25 mg by mouth 2 (two) times daily.   . Multiple Vitamin  (MULTIVITAMIN WITH MINERALS) TABS tablet Take 1 tablet by mouth every evening.   . potassium chloride (KLOR-CON) 10 MEQ tablet Take one tablet by mouth with lasix. (Patient taking differently: Take 10 mEq by mouth 2 (two) times a week. (take with furosemide))  . tamsulosin (FLOMAX) 0.4 MG CAPS capsule TAKE 1 CAPSULE BY MOUTH EVERY DAY  . telmisartan (MICARDIS) 20 MG tablet Take 20 mg by mouth daily.   No facility-administered encounter medications on file as of 03/20/2020.  Review of Systems  Constitutional: Negative for appetite change and unexpected weight change.  HENT: Negative for congestion and sinus pressure.   Respiratory: Negative for chest tightness.        No increased cough.  Breathing stable.   Cardiovascular: Negative for chest pain, palpitations and leg swelling.  Gastrointestinal: Negative for abdominal pain, diarrhea, nausea and vomiting.  Genitourinary: Negative for difficulty urinating and dysuria.  Musculoskeletal: Negative for joint swelling and myalgias.       Left rib pain as outlined.   Skin: Negative for color change and rash.  Neurological: Negative for syncope and headaches.  Psychiatric/Behavioral: Negative for agitation and dysphoric mood.       Objective:    Physical Exam Vitals reviewed.  Constitutional:      General: He is not in acute distress.    Appearance: Normal appearance. He is well-developed.  HENT:     Head: Normocephalic and atraumatic.     Comments: Small laceration - forehead - no surrounding erythema.  5 sutures in place.  Removed without difficulty.  Pt reported no problems with removal.     Right Ear: External ear normal.     Left Ear: External ear normal.  Eyes:     General: No scleral icterus.       Right eye: No discharge.        Left eye: No discharge.     Conjunctiva/sclera: Conjunctivae normal.  Cardiovascular:     Rate and Rhythm: Normal rate and regular rhythm.  Pulmonary:     Effort: Pulmonary effort is normal. No  respiratory distress.     Breath sounds: Normal breath sounds.  Abdominal:     General: Bowel sounds are normal.     Palpations: Abdomen is soft.     Tenderness: There is no abdominal tenderness.  Musculoskeletal:        General: No swelling or tenderness.  Skin:    Findings: No erythema or rash.  Neurological:     Mental Status: He is alert.  Psychiatric:        Mood and Affect: Mood normal.        Behavior: Behavior normal.     BP 130/70   Pulse (!) 59   Temp 97.9 F (36.6 C) (Oral)   Resp 16   Ht 5\' 5"  (1.651 m)   Wt 124 lb (56.2 kg)   SpO2 99%   BMI 20.63 kg/m  Wt Readings from Last 3 Encounters:  03/20/20 124 lb (56.2 kg)  03/13/20 124 lb (56.2 kg)  03/05/20 139 lb 9.6 oz (63.3 kg)     Lab Results  Component Value Date   WBC 5.6 11/29/2019   HGB 11.7 (L) 11/29/2019   HCT 35.3 (L) 11/29/2019   PLT 211.0 11/29/2019   GLUCOSE 94 11/29/2019   CHOL 111 11/29/2019   TRIG 88.0 11/29/2019   HDL 48.50 11/29/2019   LDLCALC 45 11/29/2019   ALT 7 11/29/2019   AST 20 11/29/2019   NA 135 01/24/2020   K 4.4 11/29/2019   CL 99 11/29/2019   CREATININE 0.77 11/29/2019   BUN 16 11/29/2019   CO2 28 11/29/2019   TSH 1.06 04/28/2019   INR 1.2 05/25/2019   HGBA1C 5.9 11/29/2019    DG Ribs Unilateral W/Chest Left  Result Date: 03/13/2020 CLINICAL DATA:  Left lateral rib pain after fall EXAM: LEFT RIBS AND CHEST - 3+ VIEW COMPARISON:  01/04/2019 FINDINGS: Frontal view of the chest as well as frontal and oblique  views of the left thoracic cage are obtained. Cardiac silhouette is unremarkable. Postsurgical changes from CABG. No acute airspace disease, effusion, or pneumothorax. Pleural calcification left hemidiaphragm unchanged. No acute displaced fracture. IMPRESSION: 1. No acute intrathoracic process.  No acute bony abnormality. Electronically Signed   By: Randa Ngo M.D.   On: 03/13/2020 23:05   CT Head Wo Contrast  Result Date: 03/13/2020 CLINICAL DATA:  Status post  fall. EXAM: CT HEAD WITHOUT CONTRAST TECHNIQUE: Contiguous axial images were obtained from the base of the skull through the vertex without intravenous contrast. COMPARISON:  March 06, 2015 FINDINGS: Brain: There is moderate severity cerebral atrophy with widening of the extra-axial spaces and ventricular dilatation. There are areas of decreased attenuation within the white matter tracts of the supratentorial brain, consistent with microvascular disease changes. Vascular: No hyperdense vessel or unexpected calcification. Skull: Normal. Negative for fracture or focal lesion. Sinuses/Orbits: No acute finding. Other: There is mild left frontal scalp soft tissue swelling. IMPRESSION: 1. Mild left frontal scalp soft tissue swelling without evidence of an acute fracture or acute intracranial abnormality. 2. Moderate severity cerebral atrophy and microvascular disease changes of the supratentorial brain. Electronically Signed   By: Virgina Norfolk M.D.   On: 03/13/2020 21:22   CT Cervical Spine Wo Contrast  Result Date: 03/13/2020 CLINICAL DATA:  Status post fall. EXAM: CT CERVICAL SPINE WITHOUT CONTRAST TECHNIQUE: Multidetector CT imaging of the cervical spine was performed without intravenous contrast. Multiplanar CT image reconstructions were also generated. COMPARISON:  None. FINDINGS: Alignment: 1 mm to 2 mm retrolisthesis of the C4 vertebral body is noted on C5. Skull base and vertebrae: No acute fracture. No primary bone lesion or focal pathologic process. Soft tissues and spinal canal: No prevertebral fluid or swelling. No visible canal hematoma. Disc levels: Moderate severity endplate sclerosis is seen at the levels of C4-C5, C5-C6, C6-C7 and C7-T1. Marked severity narrowing of the anterior atlantoaxial articulation is seen. Marked severity intervertebral disc space narrowing is also seen at the levels of C4-C5, C5-C6, C6-C7 and C7-T1. Mild, bilateral multilevel facet joint hypertrophy is noted. Upper  chest: Negative. Mild biapical scarring and/or atelectasis is seen. Other: None. IMPRESSION: 1. Marked severity multilevel degenerative changes, most prominent at the levels of C4-C5, C5-C6, C6-C7 and C7-T1. 2. 1 mm to 2 mm retrolisthesis of the C4 vertebral body on C5. 3. No acute cervical spine fracture. Electronically Signed   By: Virgina Norfolk M.D.   On: 03/13/2020 21:26       Assessment & Plan:   Problem List Items Addressed This Visit    Fall    Recent fall.  Has received PT.  Does not feel needed now.  Instructed need to use walker.  Follow.       Hypertension    Remains on metoprolol and micardis.  Blood pressure ok.  No low pressures.  Follow.       Laceration of forehead    Laceration of forehead.  Sutures removed.  Tolerated.  No problems reported.  No evidence of infection.       Progressive supranuclear palsy ([place])    Followed by neurology.  Walker.            [name], MD

## 2020-03-25 ENCOUNTER — Encounter: Payer: Self-pay | Admitting: Internal Medicine

## 2020-03-26 ENCOUNTER — Encounter: Payer: Self-pay | Admitting: Internal Medicine

## 2020-03-26 DIAGNOSIS — S0181XA Laceration without foreign body of other part of head, initial encounter: Secondary | ICD-10-CM | POA: Insufficient documentation

## 2020-03-26 DIAGNOSIS — W19XXXA Unspecified fall, initial encounter: Secondary | ICD-10-CM | POA: Insufficient documentation

## 2020-03-26 NOTE — Assessment & Plan Note (Signed)
Remains on metoprolol and micardis.  Blood pressure ok.  No low pressures.  Follow.

## 2020-03-26 NOTE — Assessment & Plan Note (Signed)
Followed by neurology.  Walker.

## 2020-03-26 NOTE — Assessment & Plan Note (Signed)
Laceration of forehead.  Sutures removed.  Tolerated.  No problems reported.  No evidence of infection.

## 2020-03-26 NOTE — Assessment & Plan Note (Signed)
Recent fall.  Has received PT.  Does not feel needed now.  Instructed need to use walker.  Follow.

## 2020-04-02 DIAGNOSIS — G4733 Obstructive sleep apnea (adult) (pediatric): Secondary | ICD-10-CM | POA: Diagnosis not present

## 2020-04-04 ENCOUNTER — Observation Stay
Admission: EM | Admit: 2020-04-04 | Discharge: 2020-04-06 | Disposition: A | Discharge status: Home / Self Care | Payer: Medicare HMO | Attending: Internal Medicine | Admitting: Internal Medicine

## 2020-04-04 ENCOUNTER — Other Ambulatory Visit: Payer: Self-pay

## 2020-04-04 ENCOUNTER — Encounter: Payer: Self-pay | Admitting: Emergency Medicine

## 2020-04-04 DIAGNOSIS — Z20822 Contact with and (suspected) exposure to covid-19: Secondary | ICD-10-CM | POA: Insufficient documentation

## 2020-04-04 DIAGNOSIS — Z881 Allergy status to other antibiotic agents status: Secondary | ICD-10-CM | POA: Diagnosis not present

## 2020-04-04 DIAGNOSIS — I480 Paroxysmal atrial fibrillation: Secondary | ICD-10-CM | POA: Insufficient documentation

## 2020-04-04 DIAGNOSIS — M6281 Muscle weakness (generalized): Secondary | ICD-10-CM | POA: Diagnosis not present

## 2020-04-04 DIAGNOSIS — K921 Melena: Secondary | ICD-10-CM | POA: Diagnosis present

## 2020-04-04 DIAGNOSIS — Z7951 Long term (current) use of inhaled steroids: Secondary | ICD-10-CM | POA: Diagnosis not present

## 2020-04-04 DIAGNOSIS — E785 Hyperlipidemia, unspecified: Secondary | ICD-10-CM | POA: Diagnosis not present

## 2020-04-04 DIAGNOSIS — K922 Gastrointestinal hemorrhage, unspecified: Secondary | ICD-10-CM | POA: Diagnosis not present

## 2020-04-04 DIAGNOSIS — Z87891 Personal history of nicotine dependence: Secondary | ICD-10-CM | POA: Insufficient documentation

## 2020-04-04 DIAGNOSIS — Z8249 Family history of ischemic heart disease and other diseases of the circulatory system: Secondary | ICD-10-CM | POA: Diagnosis not present

## 2020-04-04 DIAGNOSIS — Z79899 Other long term (current) drug therapy: Secondary | ICD-10-CM | POA: Insufficient documentation

## 2020-04-04 DIAGNOSIS — Z951 Presence of aortocoronary bypass graft: Secondary | ICD-10-CM | POA: Diagnosis not present

## 2020-04-04 DIAGNOSIS — D509 Iron deficiency anemia, unspecified: Secondary | ICD-10-CM | POA: Insufficient documentation

## 2020-04-04 DIAGNOSIS — Z7982 Long term (current) use of aspirin: Secondary | ICD-10-CM | POA: Diagnosis not present

## 2020-04-04 DIAGNOSIS — I4891 Unspecified atrial fibrillation: Secondary | ICD-10-CM | POA: Diagnosis present

## 2020-04-04 DIAGNOSIS — I10 Essential (primary) hypertension: Secondary | ICD-10-CM | POA: Diagnosis present

## 2020-04-04 DIAGNOSIS — I2510 Atherosclerotic heart disease of native coronary artery without angina pectoris: Secondary | ICD-10-CM | POA: Diagnosis not present

## 2020-04-04 DIAGNOSIS — Z9181 History of falling: Secondary | ICD-10-CM | POA: Insufficient documentation

## 2020-04-04 DIAGNOSIS — Z880 Allergy status to penicillin: Secondary | ICD-10-CM | POA: Diagnosis not present

## 2020-04-04 DIAGNOSIS — Z8719 Personal history of other diseases of the digestive system: Secondary | ICD-10-CM

## 2020-04-04 DIAGNOSIS — N400 Enlarged prostate without lower urinary tract symptoms: Secondary | ICD-10-CM | POA: Diagnosis present

## 2020-04-04 DIAGNOSIS — R251 Tremor, unspecified: Secondary | ICD-10-CM | POA: Diagnosis present

## 2020-04-04 LAB — PROTIME-INR
INR: 1.1 (ref 0.8–1.2)
Prothrombin Time: 13.9 seconds (ref 11.4–15.2)

## 2020-04-04 LAB — RESP PANEL BY RT-PCR (FLU A&B, COVID) ARPGX2
Influenza A by PCR: NEGATIVE
Influenza B by PCR: NEGATIVE
SARS Coronavirus 2 by RT PCR: NEGATIVE

## 2020-04-04 LAB — COMPREHENSIVE METABOLIC PANEL
ALT: 9 U/L (ref 0–44)
AST: 22 U/L (ref 15–41)
Albumin: 4.1 g/dL (ref 3.5–5.0)
Alkaline Phosphatase: 94 U/L (ref 38–126)
Anion gap: 8 (ref 5–15)
BUN: 16 mg/dL (ref 8–23)
CO2: 25 mmol/L (ref 22–32)
Calcium: 9 mg/dL (ref 8.9–10.3)
Chloride: 104 mmol/L (ref 98–111)
Creatinine, Ser: 0.85 mg/dL (ref 0.61–1.24)
GFR, Estimated: >60 mL/min (ref >60)
Glucose, Bld: 100 mg/dL — ABNORMAL HIGH (ref 70–99)
Potassium: 4.7 mmol/L (ref 3.5–5.1)
Sodium: 137 mmol/L (ref 135–145)
Total Bilirubin: 0.8 mg/dL (ref 0.3–1.2)
Total Protein: 6.9 g/dL (ref 6.5–8.1)

## 2020-04-04 LAB — CBC
HCT: 33.8 % — ABNORMAL LOW (ref 39.0–52.0)
Hemoglobin: 11.2 g/dL — ABNORMAL LOW (ref 13.0–17.0)
MCH: 31.8 pg (ref 26.0–34.0)
MCHC: 33.1 g/dL (ref 30.0–36.0)
MCV: 96 fL (ref 80.0–100.0)
Platelets: 194 10*3/uL (ref 150–400)
RBC: 3.52 MIL/uL — ABNORMAL LOW (ref 4.22–5.81)
RDW: 15.2 % (ref 11.5–15.5)
WBC: 5.6 10*3/uL (ref 4.0–10.5)
nRBC: 0 % (ref 0.0–0.2)

## 2020-04-04 MED ORDER — ALBUTEROL SULFATE HFA 108 (90 BASE) MCG/ACT IN AERS
2.0000 | INHALATION_SPRAY | Freq: Four times a day (QID) | RESPIRATORY_TRACT | Status: DC | PRN
  Filled 2020-04-04: qty 6.7

## 2020-04-04 MED ORDER — GUAIFENESIN ER 600 MG PO TB12
600.0000 mg | ORAL_TABLET | Freq: Two times a day (BID) | ORAL | Status: DC
  Administered 2020-04-04 – 2020-04-06 (×4): 600 mg via ORAL
  Filled 2020-04-04 (×4): qty 1

## 2020-04-04 MED ORDER — TAMSULOSIN HCL 0.4 MG PO CAPS
0.4000 mg | ORAL_CAPSULE | Freq: Every day | ORAL | Status: DC
  Administered 2020-04-05 – 2020-04-06 (×2): 0.4 mg via ORAL
  Filled 2020-04-04 (×2): qty 1

## 2020-04-04 MED ORDER — ATORVASTATIN CALCIUM 20 MG PO TABS
20.0000 mg | ORAL_TABLET | Freq: Every day | ORAL | Status: DC
  Administered 2020-04-04 – 2020-04-05 (×2): 20 mg via ORAL
  Filled 2020-04-04 (×2): qty 1

## 2020-04-04 MED ORDER — ADULT MULTIVITAMIN W/MINERALS CH
1.0000 | ORAL_TABLET | Freq: Every evening | ORAL | Status: DC
  Administered 2020-04-04 – 2020-04-05 (×2): 1 via ORAL
  Filled 2020-04-04 (×2): qty 1

## 2020-04-04 MED ORDER — ACETAMINOPHEN 325 MG PO TABS: 325.0000 mg | ORAL_TABLET | Freq: Four times a day (QID) | ORAL | Status: DC | PRN

## 2020-04-04 MED ORDER — METOPROLOL TARTRATE 25 MG PO TABS
25.0000 mg | ORAL_TABLET | Freq: Two times a day (BID) | ORAL | Status: DC
  Administered 2020-04-04 – 2020-04-06 (×4): 25 mg via ORAL
  Filled 2020-04-04 (×4): qty 1

## 2020-04-04 MED ORDER — CARBIDOPA-LEVODOPA 25-100 MG PO TABS
2.0000 | ORAL_TABLET | Freq: Three times a day (TID) | ORAL | Status: DC
  Administered 2020-04-04 – 2020-04-06 (×6): 2 via ORAL
  Filled 2020-04-04 (×8): qty 2

## 2020-04-04 MED ORDER — ONDANSETRON HCL 4 MG PO TABS: 4.0000 mg | ORAL_TABLET | Freq: Four times a day (QID) | ORAL | Status: DC | PRN

## 2020-04-04 MED ORDER — CITALOPRAM HYDROBROMIDE 20 MG PO TABS
20.0000 mg | ORAL_TABLET | Freq: Every day | ORAL | Status: DC
  Administered 2020-04-04 – 2020-04-06 (×3): 20 mg via ORAL
  Filled 2020-04-04 (×3): qty 1

## 2020-04-04 MED ORDER — LATANOPROST 0.005 % OP SOLN
1.0000 [drp] | Freq: Every day | OPHTHALMIC | Status: DC
  Administered 2020-04-04 – 2020-04-05 (×2): 1 [drp] via OPHTHALMIC
  Filled 2020-04-04: qty 2.5

## 2020-04-04 MED ORDER — FLUTICASONE FUROATE-VILANTEROL 200-25 MCG/INH IN AEPB
1.0000 | INHALATION_SPRAY | Freq: Every day | RESPIRATORY_TRACT | Status: DC
  Administered 2020-04-05 – 2020-04-06 (×2): 1 via RESPIRATORY_TRACT
  Filled 2020-04-04: qty 28

## 2020-04-04 MED ORDER — AZELASTINE HCL 0.1 % NA SOLN
1.0000 | Freq: Two times a day (BID) | NASAL | Status: DC | PRN
  Filled 2020-04-04: qty 30

## 2020-04-04 MED ORDER — ONDANSETRON HCL 4 MG/2ML IJ SOLN: 4.0000 mg | Freq: Four times a day (QID) | INTRAMUSCULAR | Status: DC | PRN

## 2020-04-04 MED ORDER — FUROSEMIDE 40 MG PO TABS
20.0000 mg | ORAL_TABLET | ORAL | Status: DC
  Administered 2020-04-05: 20 mg via ORAL
  Filled 2020-04-04: qty 1

## 2020-04-04 MED ORDER — ACETAMINOPHEN 650 MG RE SUPP: 325.0000 mg | Freq: Four times a day (QID) | RECTAL | Status: DC | PRN

## 2020-04-04 MED ORDER — ASCORBIC ACID 500 MG PO TABS
250.0000 mg | ORAL_TABLET | Freq: Every day | ORAL | Status: DC
  Administered 2020-04-05 – 2020-04-06 (×2): 250 mg via ORAL
  Filled 2020-04-04 (×2): qty 1

## 2020-04-04 MED ORDER — FLUTICASONE PROPIONATE 50 MCG/ACT NA SUSP
2.0000 | Freq: Every day | NASAL | Status: DC | PRN
  Filled 2020-04-04: qty 16

## 2020-04-04 MED ORDER — RIVASTIGMINE 4.6 MG/24HR TD PT24
4.6000 mg | MEDICATED_PATCH | Freq: Every day | TRANSDERMAL | Status: DC
  Administered 2020-04-05 – 2020-04-06 (×2): 4.6 mg via TRANSDERMAL
  Filled 2020-04-04 (×2): qty 1

## 2020-04-04 MED ORDER — CARBIDOPA-LEVODOPA ER 50-200 MG PO TBCR
1.0000 | EXTENDED_RELEASE_TABLET | Freq: Every day | ORAL | Status: DC
  Administered 2020-04-04 – 2020-04-05 (×2): 1 via ORAL
  Filled 2020-04-04 (×3): qty 1

## 2020-04-04 MED ORDER — VITAMIN D 25 MCG (1000 UNIT) PO TABS
2000.0000 [IU] | ORAL_TABLET | Freq: Every evening | ORAL | Status: DC
  Administered 2020-04-04 – 2020-04-05 (×2): 2000 [IU] via ORAL
  Filled 2020-04-04 (×2): qty 2

## 2020-04-04 MED ORDER — IRBESARTAN 75 MG PO TABS
75.0000 mg | ORAL_TABLET | Freq: Every day | ORAL | Status: DC
  Administered 2020-04-04 – 2020-04-06 (×3): 75 mg via ORAL
  Filled 2020-04-04 (×3): qty 1

## 2020-04-04 NOTE — ED Notes (Signed)
Medications have not been verified by pharmacy. Will administer when they are.

## 2020-04-04 NOTE — ED Triage Notes (Signed)
Pt to ED via POV with c/o rectal bleeding, pt states went to the bathroom and noticed the bleeding yesterday. Pt's daughter states hx of GI bleeding. States started last night with dark blood noted, noticed continued bleeding this morning. Pt's daughter reports blood noted in stool and when he wipes.

## 2020-04-04 NOTE — Consult Note (Signed)
[facility] GI Inpatient Consult Note   Kathline Magic, M.D.  Reason for Consult: Lower GI bleed   Attending Requesting Consult: Rupert Stacks, D.O.  Outpatient Primary Physician: [name], M.D.  History of Present Illness: Tyler Stout is a 87 y.o. male with a history of CAD, atrial fibrillation, hypertension and progressive supranuclear palsy.  He has he is brought in by his wife today for symptoms of acute lower gastrointestinal bleeding.  Patient's wife noticed that when the patient had finished a bowel movement that there was blood on the tissue.  The patient's called his wife into the bathroom again early this morning with her was a large amount of blood in the toilet.  There was no apparent alarm or warning prior to bleeding.  There is no abdominal pain, nausea or vomiting, hematemesis or melena.  Patient reported the emergency room and remained hemodynamically stable with a hemoglobin around his baseline at 11. GI history is significant for previous diverticular bleed of the colon in May 2021 with a positive bleeding scan in the right colon is a site of active bleeding.  Dr. Hortencia Pilar performed acute interventional angiography with coil embolization of the right colic artery thus stopping the bleeding.  Patient had repeat presumed diverticular bleed in June 2021 and GI was consulted.  Dr. Bonna Gains performed colonoscopy on July 14, 2019 revealing left-sided and transverse colon diverticulosis without stigmata of active or recent bleeding.  Quality of preparation was fair.  Patient was discharged at that time without incident or further need for intervention.  Patient does not take prescription anticoagulants.  Past Medical History:  Past Medical History:  Diagnosis Date  . Anemia    iron deficiency  . Arthritis   . Atrial fibrillation ([place])   . Bilateral renal cysts    worked up by Dr Bernardo Heater  . BPH (benign prostatic hypertrophy)   . CAD (coronary artery disease)     s/p bypass graft x 4 (1991)  . Diverticulosis   . GERD (gastroesophageal reflux disease)   . GI bleed   . Glaucoma   . Hypercholesterolemia   . Hypertension   . Pancreatitis    x2 (1994 and 1998), presumed to be gallstone pancreatitis.  s/p ERCP and sphincterotomy    Problem List: Patient Active Problem List   Diagnosis Date Noted  . Laceration of forehead 03/26/2020  . Fall 03/26/2020  . Cardiomyopathy ([place]) 07/23/2019  . Rectal bleeding 07/12/2019  . History of GI diverticular bleed 07/12/2019  . Rectal bleed 07/12/2019  . GI bleeding 05/25/2019  . Acute lower GI bleeding 05/25/2019  . Lower extremity edema 01/02/2019  . Chronic bronchitis ([place]) 10/10/2018  . Scalp lesion 10/10/2018  . Pneumonia 07/28/2018  . PNA (pneumonia) 05/07/2018  . Cough 05/05/2018  . Hyperglycemia 09/13/2017  . Decreased pedal pulses 04/01/2017  . Dizziness 02/07/2017  . Progressive supranuclear palsy ([place]) 11/02/2016  . Thoracic aortic atherosclerosis ([place]) 08/12/2016  . Abnormal CXR 07/18/2016  . Urinary frequency 06/20/2015  . Lower GI bleed   . Demand ischemia ([place])   . Chronic atrial fibrillation ([place])   . Essential hypertension   . HLD (hyperlipidemia)   . BPH (benign prostatic hyperplasia)   . Tremor 06/10/2015  . Loss of weight 06/10/2015  . GI bleed 06/08/2015  . Health care maintenance 08/11/2014  . Light headedness 03/19/2014  . Back pain 03/13/2013  . Hypokalemia 02/20/2013  . CAD (coronary artery disease) 12/07/2011  . Hyponatremia 12/07/2011  . Atrial fibrillation (  [place]) 12/07/2011  . Hypertension 11/20/2011  . Hypercholesteremia 11/20/2011  . Anemia 11/20/2011    Past Surgical History: Past Surgical History:  Procedure Laterality Date  . APPENDECTOMY    . CHOLECYSTECTOMY  1994  . COLONOSCOPY N/A 07/14/2019   Procedure: COLONOSCOPY;  Surgeon: Virgel Manifold, MD;  Location: [facility];  Service: Endoscopy;  Laterality: N/A;  . CORONARY ARTERY BYPASS GRAFT      x 4,  1991  . EMBOLIZATION Right 05/25/2019   Procedure: EMBOLIZATION (Right Colon);  Surgeon: Katha Cabal, MD;  Location: [facility];  Service: Cardiovascular;  Laterality: Right;  . INGUINAL HERNIA REPAIR  2009  . open heart surgery  1991    Allergies: Allergies  Allergen Reactions  . Ilosone [Erythromycin] Other (See Comments)    Reaction:  GI upset   . Penicillins Rash and Other (See Comments)    Has patient had a PCN reaction causing immediate rash, facial/tongue/throat swelling, SOB or lightheadedness with hypotension: No Has patient had a PCN reaction causing severe rash involving mucus membranes or skin necrosis: No Has patient had a PCN reaction that required hospitalization No Has patient had a PCN reaction occurring within the last 10 years: No If all of the above answers are "NO", then may proceed with Cephalosporin use.    Home Medications: (Not in a hospital admission)  Home medication reconciliation was completed with the patient.   Scheduled Inpatient Medications:   . ascorbic acid  250 mg Oral Daily  . atorvastatin  20 mg Oral Daily  . carbidopa-levodopa  1 tablet Oral QHS  . citalopram  20 mg Oral Daily  . fluticasone furoate-vilanterol  1 puff Inhalation Daily  . [START ON 04/05/2020] furosemide  20 mg Oral Once per day on Mon Thu  . guaiFENesin  600 mg Oral BID  . irbesartan  75 mg Oral Daily  . latanoprost  1 drop Both Eyes QHS  . metoprolol tartrate  25 mg Oral BID  . multivitamin with minerals  1 tablet Oral QPM  . [START ON 04/05/2020] rivastigmine  4.6 mg Transdermal Daily  . tamsulosin  0.4 mg Oral Daily  . Vitamin D  2,000 Units Oral QPM    Continuous Inpatient Infusions:    PRN Inpatient Medications:  acetaminophen **OR** acetaminophen, albuterol, fluticasone, ondansetron **OR** ondansetron (ZOFRAN) IV  Family History: family history includes Heart disease in an other family member; Kidney disease in his sister; Throat  cancer in his father.   GI Family History: Negative  Social History:   reports that he quit smoking about 56 years ago. His smoking use included cigarettes. He has a 50.00 pack-year smoking history. He has never used smokeless tobacco. He reports that he does not drink alcohol and does not use drugs. The patient denies ETOH, tobacco, or drug use.    Review of Systems: Review of Systems - Negative except History of present illness  Physical Examination: BP (!) 163/65   Pulse 69   Temp 98.4 F (36.9 C) (Oral)   Resp 20   Ht 5\' 5"  (1.651 m)   Wt 56.2 kg   SpO2 100%   BMI 20.63 kg/m  Physical Exam Vitals reviewed.  Constitutional:      General: He is not in acute distress.    Appearance: He is not ill-appearing, toxic-appearing or diaphoretic.  HENT:     Head: Normocephalic and atraumatic.     Nose: Nose normal.     Mouth/Throat:  Mouth: Mucous membranes are moist.  Eyes:     Extraocular Movements: Extraocular movements intact.     Pupils: Pupils are equal, round, and reactive to light.  Cardiovascular:     Rate and Rhythm: Normal rate.     Pulses: Normal pulses.  Pulmonary:     Effort: Pulmonary effort is normal.     Breath sounds: Normal breath sounds.  Abdominal:     General: There is no distension.     Palpations: Abdomen is soft. There is no mass.     Tenderness: There is no guarding.     Hernia: No hernia is present.  Musculoskeletal:        General: No deformity.     Cervical back: Normal range of motion.  Skin:    General: Skin is warm and dry.     Capillary Refill: Capillary refill takes less than 2 seconds.  Neurological:     Mental Status: He is alert and oriented to person, place, and time.  Psychiatric:        Mood and Affect: Mood normal.     Data: Lab Results  Component Value Date   WBC 5.6 04/04/2020   HGB 11.2 (L) 04/04/2020   HCT 33.8 (L) 04/04/2020   MCV 96.0 04/04/2020   PLT 194 04/04/2020   Recent Labs  Lab 04/04/20 1105  HGB  11.2*   Lab Results  Component Value Date   NA 137 04/04/2020   K 4.7 04/04/2020   CL 104 04/04/2020   CO2 25 04/04/2020   BUN 16 04/04/2020   CREATININE 0.85 04/04/2020   Lab Results  Component Value Date   ALT 9 04/04/2020   AST 22 04/04/2020   ALKPHOS 94 04/04/2020   BILITOT 0.8 04/04/2020   Recent Labs  Lab 04/04/20 1105  INR 1.1   CBC Latest Ref Rng & Units 04/04/2020 11/29/2019 07/22/2019  WBC 4.0 - 10.5 K/uL 5.6 5.6 4.7  Hemoglobin 13.0 - 17.0 g/dL 11.2(L) 11.7(L) 10.4(L)  Hematocrit 39.0 - 52.0 % 33.8(L) 35.3(L) 31.2(L)  Platelets 150 - 400 K/uL 194 211.0 196.0    STUDIES: No results found. @IMAGES @  Assessment: 1. Acute lower Gastrointestinal bleed - Likely recurrent diverticular bleeding, currently stable.  2. Advanced age.  3. Parkinson's symptoms, movement disorder.  4. Atrial fibrillation - Stable normal rate.  COVID-19 status: Test pending at time of this consultation.  Recommendations:  1. Clear liquid diet ok.  2. Check serial H/H.  3. Serial exams, follow up symptoms.  4. IF bleeding resolves completely, may be able to advance diet and discharge home tomorrow. Will order trbc scan for any unresolved, active rebleeding. Further interventions will be planned pending results of trbc scan, if necessary.,  5. Will follow along with you.   Thank you for the consult. Please call with questions or concerns.  Olean Ree, "Lanny Hurst MD [facility] [place], [place] [address] [phone]  04/04/2020 3:04 PM

## 2020-04-04 NOTE — Plan of Care (Signed)

## 2020-04-04 NOTE — ED Provider Notes (Signed)
[facility] Emergency Department Provider Note ____________________________________________   Event Date/Time   First MD Initiated Contact with Patient 04/04/20 1248     (approximate)  I have reviewed the triage vital signs and the nursing notes.   HISTORY  Chief Complaint Rectal Bleeding    HPI Tyler Stout is a 87 y.o. male with PMH as noted below including atrial fibrillation (not on anticoagulation), GI bleed, and iron deficiency anemia who presents with multiple episodes of bright red blood in his stool since last night.  It is occurring with bowel movements.  The patient denies any associated vomiting.  He has no abdominal pain.  He reports a previous episode of bleeding last summer.  Past Medical History:  Diagnosis Date  . Anemia    iron deficiency  . Arthritis   . Atrial fibrillation ([place])   . Bilateral renal cysts    worked up by Dr Bernardo Heater  . BPH (benign prostatic hypertrophy)   . CAD (coronary artery disease)    s/p bypass graft x 4 (1991)  . Diverticulosis   . GERD (gastroesophageal reflux disease)   . GI bleed   . Glaucoma   . Hypercholesterolemia   . Hypertension   . Pancreatitis    x2 (1994 and 1998), presumed to be gallstone pancreatitis.  s/p ERCP and sphincterotomy    Patient Active Problem List   Diagnosis Date Noted  . Laceration of forehead 03/26/2020  . Fall 03/26/2020  . Cardiomyopathy ([place]) 07/23/2019  . Rectal bleeding 07/12/2019  . History of GI diverticular bleed 07/12/2019  . Rectal bleed 07/12/2019  . GI bleeding 05/25/2019  . Acute lower GI bleeding 05/25/2019  . Lower extremity edema 01/02/2019  . Chronic bronchitis ([place]) 10/10/2018  . Scalp lesion 10/10/2018  . Pneumonia 07/28/2018  . PNA (pneumonia) 05/07/2018  . Cough 05/05/2018  . Hyperglycemia 09/13/2017  . Decreased pedal pulses 04/01/2017  . Dizziness 02/07/2017  . Progressive supranuclear palsy ([place]) 11/02/2016  . Thoracic aortic  atherosclerosis ([place]) 08/12/2016  . Abnormal CXR 07/18/2016  . Urinary frequency 06/20/2015  . Lower GI bleed   . Demand ischemia ([place])   . Chronic atrial fibrillation ([place])   . Essential hypertension   . HLD (hyperlipidemia)   . BPH (benign prostatic hyperplasia)   . Tremor 06/10/2015  . Loss of weight 06/10/2015  . GI bleed 06/08/2015  . Health care maintenance 08/11/2014  . Light headedness 03/19/2014  . Back pain 03/13/2013  . Hypokalemia 02/20/2013  . CAD (coronary artery disease) 12/07/2011  . Hyponatremia 12/07/2011  . Atrial fibrillation ([employer]) 12/07/2011  . Hypertension 11/20/2011  . Hypercholesteremia 11/20/2011  . Anemia 11/20/2011    Past Surgical History:  Procedure Laterality Date  . APPENDECTOMY    . CHOLECYSTECTOMY  1994  . COLONOSCOPY N/A 07/14/2019   Procedure: COLONOSCOPY;  Surgeon: Virgel Manifold, MD;  Location: [facility] ENDOSCOPY;  Service: Endoscopy;  Laterality: N/A;  . CORONARY ARTERY BYPASS GRAFT     x 4,  1991  . EMBOLIZATION Right 05/25/2019   Procedure: EMBOLIZATION (Right Colon);  Surgeon: Katha Cabal, MD;  Location: [place] CV LAB;  Service: Cardiovascular;  Laterality: Right;  . INGUINAL HERNIA REPAIR  2009  . open heart surgery  1991    Prior to Admission medications   Medication Sig Start Date End Date Taking? Authorizing Provider  albuterol (VENTOLIN HFA) 108 (90 Base) MCG/ACT inhaler Inhale 2 puffs into the lungs every 6 (six) hours as needed for wheezing  or shortness of breath.   Yes [provider]  ascorbic acid (VITAMIN C) 250 MG CHEW Chew 250 mg by mouth daily.   Yes [provider]  aspirin EC 81 MG tablet Take 81 mg by mouth at bedtime.    Yes [provider]  atorvastatin (LIPITOR) 20 MG tablet TAKE 1 TABLET (20 MG TOTAL) BY MOUTH DAILY. 02/03/20  Yes [name], MD  azelastine (ASTELIN) 0.1 % nasal spray PLACE 1 SPRAY INTO BOTH NOSTRILS 2 (TWO) TIMES DAILY Patient taking differently:  Place 1 spray into both nostrils 2 (two) times daily as needed for rhinitis or allergies. 12/17/18  Yes Scott, Randell Patient, MD  BREO ELLIPTA 200-25 MCG/INH AEPB Inhale 1 puff into the lungs daily. 03/05/20  Yes [name], MD  carbidopa-levodopa (SINEMET CR) 50-200 MG tablet Take 1 tablet by mouth at bedtime.   Yes [provider]  carbidopa-levodopa (SINEMET IR) 25-100 MG tablet Take 2 tablets by mouth 3 (three) times daily.   Yes [provider]  Cholecalciferol (VITAMIN D) 50 MCG (2000 UT) tablet Take 2,000 Units by mouth every evening.    Yes [provider]  citalopram (CELEXA) 20 MG tablet TAKE 1 TABLET (20 MG TOTAL) BY MOUTH DAILY. 02/03/20  Yes [name], MD  fluticasone (FLONASE) 50 MCG/ACT nasal spray SPRAY 2 SPRAYS INTO EACH NOSTRIL EVERY DAY Patient taking differently: Place 2 sprays into both nostrils daily as needed for allergies or rhinitis. 12/02/18  Yes [name], MD  furosemide (LASIX) 20 MG tablet Take 20 mg by mouth daily as needed.   Yes [provider]  guaiFENesin (MUCINEX) 600 MG 12 hr tablet Take 600 mg by mouth daily.   Yes [provider]  latanoprost (XALATAN) 0.005 % ophthalmic solution Place 1 drop into both eyes at bedtime.    Yes [provider]  metoprolol tartrate (LOPRESSOR) 25 MG tablet Take 25 mg by mouth 2 (two) times daily.    Yes [provider]  Multiple Vitamin (MULTIVITAMIN WITH MINERALS) TABS tablet Take 1 tablet by mouth every evening.    Yes [provider]  potassium chloride (KLOR-CON) 10 MEQ tablet Take one tablet by mouth with lasix. Patient taking differently: Take 10 mEq by mouth daily as needed. 01/05/19  Yes [name], MD  rivastigmine (EXELON) 4.6 mg/24hr Place 1 patch onto the skin daily. 03/08/20 03/08/21 Yes [provider]  tamsulosin (FLOMAX) 0.4 MG CAPS capsule TAKE 1 CAPSULE BY MOUTH EVERY DAY 11/09/19  Yes [name], MD  telmisartan  (MICARDIS) 20 MG tablet Take 20 mg by mouth daily. 02/08/19  Yes [provider]    Allergies Ilosone [erythromycin] and Penicillins  Family History  Problem Relation Age of Onset  . Throat cancer Father        oropharyngeal cancer  . Kidney disease Sister   . Heart disease Other   . Colon cancer Neg Hx   . Prostate cancer Neg Hx     Social History Social History   Tobacco Use  . Smoking status: Former Smoker    Packs/day: 2.00    Years: 25.00    Pack years: 50.00    Types: Cigarettes    Quit date: 01/14/1964    Years since quitting: 56.2  . Smokeless tobacco: Never Used  . Tobacco comment: smoked 2 packs/day, 1 pipe and 1 cigar once in a while.  Substance Use Topics  . Alcohol use: No    Alcohol/week: 0.0 standard drinks  . Drug  use: No    Review of Systems  Constitutional: No fever/chills Eyes: No visual changes. ENT: No sore throat. Cardiovascular: Denies chest pain. Respiratory: Denies shortness of breath. Gastrointestinal: No vomiting or diarrhea.  Genitourinary: Negative for dysuria.  Musculoskeletal: Negative for back pain. Skin: Negative for rash. Neurological: Negative for headaches, focal weakness or numbness.   ____________________________________________   PHYSICAL EXAM:  VITAL SIGNS: ED Triage Vitals  Enc Vitals Group     BP 04/04/20 1103 (!) 169/56     Pulse Rate 04/04/20 1103 66     Resp 04/04/20 1103 20     Temp 04/04/20 1103 98.4 F (36.9 C)     Temp Source 04/04/20 1103 Oral     SpO2 04/04/20 1103 98 %     Weight 04/04/20 1102 124 lb (56.2 kg)     Height 04/04/20 1102 5\' 5"  (1.651 m)     Head Circumference --      Peak Flow --      Pain Score 04/04/20 1104 0     Pain Loc --      Pain Edu? --      Excl. in [place]? --     Constitutional: Alert and oriented.  Frail appearing but in no acute distress. Eyes: Conjunctivae are normal.  Head: Atraumatic. Nose: No congestion/rhinnorhea. Mouth/Throat: Mucous membranes are moist.    Neck: Normal range of motion.  Cardiovascular: Normal rate, regular rhythm. Good peripheral circulation. Respiratory: Normal respiratory effort.  No retractions.  Gastrointestinal: Soft and nontender. No distention.  Genitourinary: No flank tenderness. Musculoskeletal: Extremities warm and well perfused.  Neurologic:  Normal speech and language. No gross focal neurologic deficits are appreciated.  Skin:  Skin is warm and dry. No rash noted. Psychiatric: Mood and affect are normal. Speech and behavior are normal.  ____________________________________________   LABS (all labs ordered are listed, but only abnormal results are displayed)  Labs Reviewed  COMPREHENSIVE METABOLIC PANEL - Abnormal; Notable for the following components:      Result Value   Glucose, Bld 100 (*)    All other components within normal limits  CBC - Abnormal; Notable for the following components:   RBC 3.52 (*)    Hemoglobin 11.2 (*)    HCT 33.8 (*)    All other components within normal limits  RESP PANEL BY RT-PCR (FLU A&B, COVID) ARPGX2  PROTIME-INR  POC OCCULT BLOOD, ED  TYPE AND SCREEN   ____________________________________________  EKG  ED ECG REPORT I, Arta Silence, the attending physician, personally viewed and interpreted this ECG.  Date: 04/04/2020 EKG Time: 1109 Rate: 69 Rhythm: normal sinus rhythm QRS Axis: Left axis Intervals: RBBB ST/T Wave abnormalities: normal Narrative Interpretation: no evidence of acute ischemia  ____________________________________________  RADIOLOGY    ____________________________________________   PROCEDURES  Procedure(s) performed: No  Procedures  Critical Care performed: No ____________________________________________   INITIAL IMPRESSION / ASSESSMENT AND PLAN / ED COURSE  Pertinent labs & imaging results that were available during my care of the patient were reviewed by me and considered in my medical decision making (see chart for  details).  87 year old male with PMH as noted above presents with several episodes of bright red blood in his stool since yesterday.  He has no associated abdominal pain.  I reviewed the past medical records in epic.  The patient was most recent admitted last July with a GI bleed.  Colonoscopy at that time showed multiple diverticuli with no evidence of active bleeding.  He has been stable  since that admission.  He has no history of upper GI bleed.  On exam the patient is somewhat frail and weak appearing but in no acute distress.  His vital signs are normal except for hypertension.  The abdomen is soft and nontender.  Overall presentation is most consistent with lower GI bleed, most likely diverticular bleed.  Given the patient's age and the recurrent nature of the bleeding, the patient will benefit from overnight admission for serial hemoglobins and GI consult.  I consulted Dr. Alice Reichert from GI to evaluate the patient.  ----------------------------------------- 3:15 PM on 04/04/2020 -----------------------------------------  I consulted Dr. Tobie Poet from the hospitalist service for admission.  ____________________________________________   FINAL CLINICAL IMPRESSION(S) / ED DIAGNOSES  Final diagnoses:  Lower GI bleed      NEW MEDICATIONS STARTED DURING THIS VISIT:  New Prescriptions   No medications on file     Note:  This document was prepared using Dragon voice recognition software and may include unintentional dictation errors.   Arta Silence, MD 04/04/20 [phone]

## 2020-04-04 NOTE — H&P (Addendum)
History and Physical   MICHARL HELMES CVE:[id number] DOB: [birth year] DOA: 04/04/2020  PCP: [name], MD  Outpatient Specialists: [facility] Cardiology, Dr. Ubaldo Glassing Patient coming from: home via [place]  I have personally briefly reviewed patient's old medical records in [place].  Chief Concern: rectal bleeding  HPI: Tyler Stout is a 87 y.o. male with medical history significant for hypertension, coronary artery disease status post CABG x4 in 1991, paroxysmal atrial fibrillation and not on anticoagulation due to risk of GI bleeding, hypertension, orthostatic hypotension, hyperlipidemia, simple chronic bronchitis, history of AV malformation of the stomach, history of diverticulitis, history of diverticulosis bleed, gallstone pancreatitis, iron deficiency anemia, progressive supranuclear palsy, history of BPH, inguinal hernia repair in June 2009, cholecystectomy in 1994, presented to the emergency department for chief concerns of rectal bleeding.  He reports he had two episodes of red blood in the toilet last evening and this AM prompting him to present to the ED. he reports this happened before a couple of years ago and was told he had diverticular bleeding.  He reports that he was told that if he experiences again to presented to the emergency department for further evaluation.  He denies nausea and vomiting including coffee grounds, chest pain, shortness of breath, abdominal pain, new weakness, dizziness. He denies changes to diet.   Of note, he fell and hit his forehead 03/13/20 at home and got two stitches in the emergency department for a mechanical fall witnessed by patient's wife.  He was discharged from the emergency department after stitches per were placed.  Social history: Tyler Stout lives with spouse of 94 years. 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 Former tobacco user, 1.5-2 ppd, quit in 1966. He denies etoh and recreational drug use.  Allerigies: No known  life-threatening drug allergies  Vaccinations: per spouse at bedside, patient is vaccinated with two doses for covid-19, no booster  ROS: Constitutional: no weight change, no fever ENT/Mouth: no sore throat, no rhinorrhea Eyes: no eye pain, no vision changes Cardiovascular: no chest pain, no dyspnea,  no edema, no palpitations Respiratory: no cough, no sputum, no wheezing Gastrointestinal: no nausea, no vomiting, no diarrhea, no constipation Genitourinary: no urinary incontinence, no dysuria, no hematuria Musculoskeletal: no arthralgias, no myalgias Skin: no skin lesions, no pruritus, Neuro: + weakness, no loss of consciousness, no syncope Psych: no anxiety, no depression, no decrease appetite Heme/Lymph: no bruising, no bleeding  ED Course: Discussed with ED provider, patient requiring hospitalization due to rectal bleeding.  ED provider called gastroenterologist specialist, Dr. Alice Reichert.  Dr. Alice Reichert recommends admitting patient for observation and monitoring.  Vitals in the emergency department was remarkable for temperature of 98.4, heart rate of 68, blood pressure 167/33, satting at 100% on room air.  Labs in the emergency department was remarkable for serum creatinine of 0.85, GFR greater than 60, serum sodium 137, potassium 4.7, nonfasting blood glucose of 100, WBC 5.6, hemoglobin of 11.2 which is improved from his baseline of 10.0-10.7, platelets of 194. No medications were given in the emergency department.   He has a completed typed and screened in the emergency department.  Assessment/Plan  Principal Problem:   Lower GI bleed Active Problems:   Atrial fibrillation (HCC)   Tremor   Essential hypertension   HLD (hyperlipidemia)   BPH (benign prostatic hyperplasia)   History of GI diverticular bleed   # Lower GI bleed-suspect secondary to diverticular bleeding -GI has been consulted, Dr. Alice Reichert, recommends observation admission -  CBC in the a.m. -Holding home  aspirin -Clear liquids now and n.p.o. after midnight -Observation to MedSurg with telemetry  # Hypertension-resumed home irbesartan 75 mg daily, metoprolol tartrate 25 mg twice daily, furosemide 20 mg 2 times weekly, Monday and Thursday  # CAD s/p 4x CABG-resumed atorvastatin 20 mg daily, holding aspirin due to rectal bleeding, no clinical suspicion for acs at this time due to patient not having chest pain, ekg changes, and/or shortness of breath   # Hyperlipidemia-atorvastatin 20 mg daily # Depression-citalopram 20 mg daily # BPH-tamsulosin 0.5 mg daily # Iron deficiency anemia - h/h is at baseline, cbc in the AM   # Possible progressive supranuclear palsy-resumed home Sinemet 25-100 mg, 2 tablets, 3 times daily, Sinemet 50-200 nightly, rivastigmine 1 patch daily  # Frequent falls suspect secondary to PSP - fall precautions order place, up with assistance only   # Left frontal fading ecchymosis secondary to mechanical fall in early March 2022-present on admission  # He is pending covid test  Chart reviewed.   DVT prophylaxis: TED hose Code Status: full code  Diet: Clear liquids, n.p.o. at midnight Family Communication: Updated spouse at bedside Disposition Plan: Pending clinical course Consults called: Dr. Alice Reichert, GI Admission status: Observation with telemetry to [place]  Past Medical History:  Diagnosis Date  . Anemia    iron deficiency  . Arthritis   . Atrial fibrillation ([place])   . Bilateral renal cysts    worked up by Dr Bernardo Heater  . BPH (benign prostatic hypertrophy)   . CAD (coronary artery disease)    s/p bypass graft x 4 (1991)  . Diverticulosis   . GERD (gastroesophageal reflux disease)   . GI bleed   . Glaucoma   . Hypercholesterolemia   . Hypertension   . Pancreatitis    x2 (1994 and 1998), presumed to be gallstone pancreatitis.  s/p ERCP and sphincterotomy   Past Surgical History:  Procedure Laterality Date  . APPENDECTOMY    . CHOLECYSTECTOMY  1994   . COLONOSCOPY N/A 07/14/2019   Procedure: COLONOSCOPY;  Surgeon: Virgel Manifold, MD;  Location: [facility] ENDOSCOPY;  Service: Endoscopy;  Laterality: N/A;  . CORONARY ARTERY BYPASS GRAFT     x 4,  1991  . EMBOLIZATION Right 05/25/2019   Procedure: EMBOLIZATION (Right Colon);  Surgeon: Katha Cabal, MD;  Location: [place] [facility] LAB;  Service: Cardiovascular;  Laterality: Right;  . INGUINAL HERNIA REPAIR  2009  . open heart surgery  1991   Social History:  reports that he quit smoking about 56 years ago. His smoking use included cigarettes. He has a 50.00 pack-year smoking history. He has never used smokeless tobacco. He reports that he does not drink alcohol and does not use drugs.  Allergies  Allergen Reactions  . Ilosone [Erythromycin] Other (See Comments)    Reaction:  GI upset   . Penicillins Rash and Other (See Comments)    Has patient had a PCN reaction causing immediate rash, facial/tongue/throat swelling, SOB or lightheadedness with hypotension: No Has patient had a PCN reaction causing severe rash involving mucus membranes or skin necrosis: No Has patient had a PCN reaction that required hospitalization No Has patient had a PCN reaction occurring within the last 10 years: No If all of the above answers are "NO", then may proceed with Cephalosporin use.   Family History  Problem Relation Age of Onset  . Throat cancer Father        oropharyngeal cancer  . Kidney  disease Sister   . Heart disease Other   . Colon cancer Neg Hx   . Prostate cancer Neg Hx    Family history: Family history reviewed and not pertinent  Prior to Admission medications   Medication Sig Start Date End Date Taking? Authorizing Provider  albuterol (VENTOLIN HFA) 108 (90 Base) MCG/ACT inhaler Inhale 2 puffs into the lungs every 6 (six) hours as needed for wheezing or shortness of breath.    [provider]  ascorbic acid (VITAMIN C) 250 MG CHEW Chew 250 mg by mouth daily.    [provider]  aspirin EC 81 MG tablet Take 81 mg by mouth at bedtime.     [provider]  atorvastatin (LIPITOR) 20 MG tablet TAKE 1 TABLET (20 MG TOTAL) BY MOUTH DAILY. 02/03/20   [name], MD  azelastine (ASTELIN) 0.1 % nasal spray PLACE 1 SPRAY INTO BOTH NOSTRILS 2 (TWO) TIMES DAILY Patient taking differently: Place 1 spray into both nostrils 2 (two) times daily as needed for rhinitis or allergies.  12/17/18   [name], MD  BREO ELLIPTA 200-25 MCG/INH AEPB Inhale 1 puff into the lungs daily. 03/05/20   [name], MD  carbidopa-levodopa (SINEMET CR) 50-200 MG tablet Take 1 tablet by mouth at bedtime.    [provider]  carbidopa-levodopa (SINEMET IR) 25-100 MG tablet Take 2 tablets by mouth 3 (three) times daily.    [provider]  Cholecalciferol (VITAMIN D) 50 MCG (2000 UT) tablet Take 2,000 Units by mouth every evening.     [provider]  citalopram (CELEXA) 20 MG tablet TAKE 1 TABLET (20 MG TOTAL) BY MOUTH DAILY. 02/03/20   [name], MD  fluticasone (FLONASE) 50 MCG/ACT nasal spray SPRAY 2 SPRAYS INTO EACH NOSTRIL EVERY DAY Patient taking differently: Place 2 sprays into both nostrils daily as needed for allergies or rhinitis.  12/02/18   [name], MD  furosemide (LASIX) 20 MG tablet Take 20 mg by mouth 2 (two) times a week. (take on Monday and Thursday)    [provider]  guaiFENesin (MUCINEX) 600 MG 12 hr tablet Take 600 mg by mouth 2 (two) times daily.    [provider]  latanoprost (XALATAN) 0.005 % ophthalmic solution Place 1 drop into both eyes at bedtime.     [provider]  metoprolol tartrate (LOPRESSOR) 25 MG tablet Take 25 mg by mouth 2 (two) times daily.     [provider]  Multiple Vitamin (MULTIVITAMIN WITH MINERALS) TABS tablet Take 1 tablet by mouth every evening.     [provider]  potassium chloride (KLOR-CON) 10 MEQ tablet Take one tablet by mouth with  lasix. Patient taking differently: Take 10 mEq by mouth 2 (two) times a week. (take with furosemide) 01/05/19   [name], MD  rivastigmine (EXELON) 4.6 mg/24hr Place 1 patch onto the skin daily. 03/08/20 03/08/21  [provider]  tamsulosin (FLOMAX) 0.4 MG CAPS capsule TAKE 1 CAPSULE BY MOUTH EVERY DAY 11/09/19   [name], MD  telmisartan (MICARDIS) 20 MG tablet Take 20 mg by mouth daily. 02/08/19   [provider]   Physical Exam: Vitals:   04/04/20 1102 04/04/20 1103 04/04/20 1300  BP:  (!) 169/56 (!) 163/65  Pulse:  66 69  Resp:  20   Temp:  98.4 F (36.9 C)   TempSrc:  Oral   SpO2:  98% 100%  Weight: 56.2 kg    Height: 5\' 5"  (1.651 m)  Constitutional: appears age-appropriate, frail, NAD, calm, comfortable Eyes: PERRL, lids and conjunctivae normal ENMT: Mucous membranes are moist. Posterior pharynx clear of any exudate or lesions. Age-appropriate dentition.  Mild hearing loss Neck: normal, supple, no masses, no thyromegaly Respiratory: clear to auscultation bilaterally, no wheezing, no crackles. Normal respiratory effort. No accessory muscle use.  Cardiovascular: Regular rate and rhythm, no murmurs / rubs / gallops. No extremity edema. 2+ pedal pulses. No carotid bruits.  Abdomen: no tenderness, no masses palpated, no hepatosplenomegaly. Bowel sounds positive.  Musculoskeletal: no clubbing / cyanosis. No joint deformity upper and lower extremities. Good ROM, no contractures, no atrophy. Normal muscle tone.  Skin: no rashes, lesions, ulcers. No induration.  Left frontal fading ecchymosis-present admission Neurologic: Sensation intact. Strength 5/5 in all 4.  Psychiatric: Normal judgment and insight. Alert and oriented x 3, to self, age, current calendar year and spouse at bedside. Speech is slower which is baseline. Normal mood.   EKG: independently reviewed, showing sinus rhythm with first-degree AV block, rate of 69, with artifact, QTc 497.  Relatively unchanged from EKG on 05/25/2019   x-ray on Admission: not indicated  Labs on Admission: I have personally reviewed following labs  CBC: Recent Labs  Lab 04/04/20 1105  WBC 5.6  HGB 11.2*  HCT 33.8*  MCV 96.0  PLT 453   Basic Metabolic Panel: Recent Labs  Lab 04/04/20 1105  NA 137  K 4.7  CL 104  CO2 25  GLUCOSE 100*  BUN 16  CREATININE 0.85  CALCIUM 9.0   GFR: Estimated Creatinine Clearance: 44.1 mL/min (by C-G formula based on SCr of 0.85 mg/dL).  Liver Function Tests: Recent Labs  Lab 04/04/20 1105  AST 22  ALT 9  ALKPHOS 94  BILITOT 0.8  PROT 6.9  ALBUMIN 4.1   Coagulation Profile: Recent Labs  Lab 04/04/20 1105  INR 1.1   Jock Mahon N Taheera Thomann D.O. [facility]  If 7PM-7AM, please contact overnight-coverage provider If 7AM-7PM, please contact day coverage provider [email or web address]  04/04/2020, 3:15 PM

## 2020-04-05 DIAGNOSIS — K922 Gastrointestinal hemorrhage, unspecified: Secondary | ICD-10-CM | POA: Diagnosis not present

## 2020-04-05 LAB — BASIC METABOLIC PANEL
Anion gap: 7 (ref 5–15)
BUN: 12 mg/dL (ref 8–23)
CO2: 25 mmol/L (ref 22–32)
Calcium: 8.8 mg/dL — ABNORMAL LOW (ref 8.9–10.3)
Chloride: 103 mmol/L (ref 98–111)
Creatinine, Ser: 0.72 mg/dL (ref 0.61–1.24)
GFR, Estimated: >60 mL/min (ref >60)
Glucose, Bld: 81 mg/dL (ref 70–99)
Potassium: 4.2 mmol/L (ref 3.5–5.1)
Sodium: 135 mmol/L (ref 135–145)

## 2020-04-05 LAB — CBC
HCT: 29.6 % — ABNORMAL LOW (ref 39.0–52.0)
Hemoglobin: 9.8 g/dL — ABNORMAL LOW (ref 13.0–17.0)
MCH: 31.6 pg (ref 26.0–34.0)
MCHC: 33.1 g/dL (ref 30.0–36.0)
MCV: 95.5 fL (ref 80.0–100.0)
Platelets: 174 10*3/uL (ref 150–400)
RBC: 3.1 MIL/uL — ABNORMAL LOW (ref 4.22–5.81)
RDW: 14.9 % (ref 11.5–15.5)
WBC: 4.9 10*3/uL (ref 4.0–10.5)
nRBC: 0 % (ref 0.0–0.2)

## 2020-04-05 LAB — HEMOGLOBIN AND HEMATOCRIT, BLOOD
HCT: 30.4 % — ABNORMAL LOW (ref 39.0–52.0)
Hemoglobin: 10.2 g/dL — ABNORMAL LOW (ref 13.0–17.0)

## 2020-04-05 LAB — OCCULT BLOOD X 1 CARD TO LAB, STOOL: Fecal Occult Bld: POSITIVE — AB

## 2020-04-05 NOTE — TOC Initial Note (Signed)
Transition of Care [facility]) - Initial/Assessment Note    Patient Details  Name: Tyler Stout MRN: [id number] Date of Birth: Oct 11, 1925  Transition of Care [facility]) CM/SW Contact:    Pete Pelt, RN Phone Number: 04/05/2020, 4:01 PM  Clinical Narrative:   TOC in to see patient and daughter at bedside.  Purpose of visit: Discharge planning, all consented to discussing.  Patient very hard of hearing.  Recommendation is home with PT home health.  Family requested [facility], however they do not accept his insurance.  [facility] accepts [employer], patient and family amenable.  [facility] will follow up with patient on Monday, family aware.  Patient lives at home, as per daughter, she takes him to appointments and picks up medications for him.  Patient and daughter have no further questions at this time, TOC contact information given, will follow through discharge.                Expected Discharge Plan: [place] Barriers to Discharge: Continued Medical Work up   Patient Goals and CMS Choice     Choice offered to / list presented to : NA  Expected Discharge Plan and Services Expected Discharge Plan: [place] In-house Referral: NA   Post Acute Care Choice: [place] arrangements for the past 2 months: Single Family Home                           HH Arranged: PT [place]: [facility] (formerly [employer]) Date [place]: 04/05/20 Time [place]: [facility] Representative spoke with at [place]: [place] Arrangements/Services Living arrangements for the past 2 months: [place] Lives with:: Spouse Patient language and need for interpreter reviewed:: Yes Do you feel safe going back to the place where you live?: Yes      Need for Family Participation in Patient Care: Yes (Comment) Care giver support system in place?: Yes (comment)   Criminal Activity/Legal Involvement Pertinent to Current  Situation/Hospitalization: No - Comment as needed  Activities of Daily Living Home Assistive Devices/Equipment: Dentures (specify type) ADL Screening (condition at time of admission) Patient's cognitive ability adequate to safely complete daily activities?: No Is the patient deaf or have difficulty hearing?: Yes Does the patient have difficulty seeing, even when wearing glasses/contacts?: No Does the patient have difficulty concentrating, remembering, or making decisions?: No Patient able to express need for assistance with ADLs?: No Does the patient have difficulty dressing or bathing?: No Independently performs ADLs?: Yes (appropriate for developmental age) Communication: Independent Dressing (OT): Independent Grooming: Independent Feeding: Independent Bathing: Independent Toileting: Independent In/Out Bed: Independent with device (comment),Needs assistance Walks in Home: Independent Does the patient have difficulty walking or climbing stairs?: Yes Weakness of Legs: Both Weakness of Arms/Hands: None  Permission Sought/Granted Permission sought to share information with : Chartered certified accountant granted to share information with : Yes, Verbal Permission Granted     Permission granted to share info w AGENCY: [place]        Emotional Assessment Appearance:: Appears stated age Attitude/Demeanor/Rapport: Gracious (Patient was very HOH) Affect (typically observed): Appropriate,Pleasant Orientation: : Oriented to Self,Oriented to Place,Oriented to  Time,Oriented to Situation Alcohol / Substance Use: Not Applicable Psych Involvement: No (comment)  Admission diagnosis:  Lower GI bleed [K92.2] Patient Active Problem List   Diagnosis Date Noted  . Laceration of forehead 03/26/2020  . Fall  03/26/2020  . Cardiomyopathy ([place]) 07/23/2019  . Rectal bleeding 07/12/2019  . History of GI diverticular bleed 07/12/2019  . Rectal bleed 07/12/2019  . GI  bleeding 05/25/2019  . Acute lower GI bleeding 05/25/2019  . Lower extremity edema 01/02/2019  . Chronic bronchitis ([place]) 10/10/2018  . Scalp lesion 10/10/2018  . Pneumonia 07/28/2018  . PNA (pneumonia) 05/07/2018  . Cough 05/05/2018  . Hyperglycemia 09/13/2017  . Decreased pedal pulses 04/01/2017  . Dizziness 02/07/2017  . Progressive supranuclear palsy ([place]) 11/02/2016  . Thoracic aortic atherosclerosis ([place]) 08/12/2016  . Abnormal CXR 07/18/2016  . Urinary frequency 06/20/2015  . Lower GI bleed   . Demand ischemia ([place])   . Chronic atrial fibrillation ([place])   . Essential hypertension   . HLD (hyperlipidemia)   . BPH (benign prostatic hyperplasia)   . Tremor 06/10/2015  . Loss of weight 06/10/2015  . GI bleed 06/08/2015  . Health care maintenance 08/11/2014  . Light headedness 03/19/2014  . Back pain 03/13/2013  . Hypokalemia 02/20/2013  . CAD (coronary artery disease) 12/07/2011  . Hyponatremia 12/07/2011  . Atrial fibrillation ([place]) 12/07/2011  . Hypertension 11/20/2011  . Hypercholesteremia 11/20/2011  . Anemia 11/20/2011   PCP:  [name], MD Pharmacy:   [id number] [address], [place] [address] Phone: [phone] Fax: [place], [place] [address] [place] [address] Phone: [phone] Fax: [phone]     Social Determinants of Health (SDOH) Interventions    Readmission Risk Interventions Readmission Risk Prevention Plan 07/29/2018  Transportation Screening Complete  Home Care Screening Complete  Medication Review (RN CM) Complete  Some recent data might be hidden

## 2020-04-05 NOTE — Progress Notes (Signed)
[facility] Gastroenterology Inpatient Progress Note  Subjective: Patient seen for f/u LGI bleed. No recurrent bleeding on night shift or today per Vinnie Level, Therapist, sports. Patient at baseline.  He reports a "liquid brown" stool without blood.  Daughter Olivia Mackie is at bedside.  Objective: Vital signs in last 24 hours: Temp:  [97.8 F (36.6 C)-98.9 F (37.2 C)] 98.4 F (36.9 C) (03/24 1109) Pulse Rate:  [59-82] 80 (03/24 1109) Resp:  [16-18] 18 (03/24 1109) BP: (125-176)/(50-152) 125/54 (03/24 1109) SpO2:  [97 %-100 %] 97 % (03/24 1109) Blood pressure (!) 125/54, pulse 80, temperature 98.4 F (36.9 C), resp. rate 18, height 5\' 5"  (1.651 m), weight 56.2 kg, SpO2 97 %.    Intake/Output from previous day: 03/23 0701 - 03/24 0700 In: -  Out: 150 [Urine:150]  Intake/Output this shift: Total I/O In: 240 [P.O.:240] Out: -    Gen: NAD. Mildly confused. Appears comfortable.  HEENT: [place]/AT. PERRLA. Normal external ear exam.  Chest: CTA, no wheezes.  CV: RR nl S1, S2. No gallops.  Abd: soft, nt, nd. BS+  Ext: no edema. Pulses 2+  Neuro: Alert and oriented. Judgement appears normal. Nonfocal.    Lab Results: Results for orders placed or performed during the hospital encounter of 04/04/20 (from the past 24 hour(s))  Resp Panel by RT-PCR (Flu A&B, Covid) Nasopharyngeal Swab     Status: None   Collection Time: 04/04/20  3:39 PM   Specimen: Nasopharyngeal Swab; Nasopharyngeal(NP) swabs in vial transport medium  Result Value Ref Range   SARS Coronavirus 2 by RT PCR NEGATIVE NEGATIVE   Influenza A by PCR NEGATIVE NEGATIVE   Influenza B by PCR NEGATIVE NEGATIVE  Basic metabolic panel     Status: Abnormal   Collection Time: 04/05/20  5:01 AM  Result Value Ref Range   Sodium 135 135 - 145 mmol/L   Potassium 4.2 3.5 - 5.1 mmol/L   Chloride 103 98 - 111 mmol/L   CO2 25 22 - 32 mmol/L   Glucose, Bld 81 70 - 99 mg/dL   BUN 12 8 - 23 mg/dL   Creatinine, Ser 0.72 0.61 - 1.24 mg/dL   Calcium  8.8 (L) 8.9 - 10.3 mg/dL   GFR, Estimated >60 >60 mL/min   Anion gap 7 5 - 15  CBC     Status: Abnormal   Collection Time: 04/05/20  5:01 AM  Result Value Ref Range   WBC 4.9 4.0 - 10.5 K/uL   RBC 3.10 (L) 4.22 - 5.81 MIL/uL   Hemoglobin 9.8 (L) 13.0 - 17.0 g/dL   HCT 29.6 (L) 39.0 - 52.0 %   MCV 95.5 80.0 - 100.0 fL   MCH 31.6 26.0 - 34.0 pg   MCHC 33.1 30.0 - 36.0 g/dL   RDW 14.9 11.5 - 15.5 %   Platelets 174 150 - 400 K/uL   nRBC 0.0 0.0 - 0.2 %     Recent Labs    04/04/20 1105 04/05/20 0501  WBC 5.6 4.9  HGB 11.2* 9.8*  HCT 33.8* 29.6*  PLT 194 174   BMET Recent Labs    04/04/20 1105 04/05/20 0501  NA 137 135  K 4.7 4.2  CL 104 103  CO2 25 25  GLUCOSE 100* 81  BUN 16 12  CREATININE 0.85 0.72  CALCIUM 9.0 8.8*   LFT Recent Labs    04/04/20 1105  PROT 6.9  ALBUMIN 4.1  AST 22  ALT 9  ALKPHOS 94  BILITOT 0.8   PT/INR  Recent Labs    04/04/20 1105  LABPROT 13.9  INR 1.1   Hepatitis Panel No results for input(s): HEPBSAG, HCVAB, HEPAIGM, HEPBIGM in the last 72 hours. C-Diff No results for input(s): CDIFFTOX in the last 72 hours. No results for input(s): CDIFFPCR in the last 72 hours.   Studies/Results: No results found.  Scheduled Inpatient Medications:   . ascorbic acid  250 mg Oral Daily  . atorvastatin  20 mg Oral QHS  . carbidopa-levodopa  1 tablet Oral QHS  . carbidopa-levodopa  2 tablet Oral TID  . cholecalciferol  2,000 Units Oral QPM  . citalopram  20 mg Oral Daily  . fluticasone furoate-vilanterol  1 puff Inhalation Daily  . furosemide  20 mg Oral Once per day on Mon Thu  . guaiFENesin  600 mg Oral BID  . irbesartan  75 mg Oral Daily  . latanoprost  1 drop Both Eyes QHS  . metoprolol tartrate  25 mg Oral BID  . multivitamin with minerals  1 tablet Oral QPM  . rivastigmine  4.6 mg Transdermal Daily  . tamsulosin  0.4 mg Oral Daily    Continuous Inpatient Infusions:    PRN Inpatient Medications:  acetaminophen **OR**  acetaminophen, albuterol, azelastine, fluticasone, ondansetron **OR** ondansetron (ZOFRAN) IV  Assessment: 1. Acute lower Gastrointestinal bleed - Likely recurrent diverticular bleeding, stable, clinically resolved.  2. Advanced age.  3. Parkinson's symptoms, movement disorder.  4. Atrial fibrillation - Stable normal rate.  COVID-19 status:        Test pending at time of this consultation.  Recommendations:  1. Advance diet. 2. Ok to discharge from a GI standpoint. 3. Recommendations discussed with patient and daughter, Olivia Mackie. 4. Outpatient H/H in one week. 5. Follow up with GI as needed.    Teodoro K. Alice Reichert, M.D. 04/05/2020, 2:16 PM

## 2020-04-05 NOTE — Evaluation (Signed)
Physical Therapy Evaluation Patient Details Name: Tyler Stout MRN: [id number] DOB: [birth year] Today's Date: 04/05/2020   History of Present Illness  PEARL BERLINGER is a 87 y.o. male with medical history significant for hypertension, coronary artery disease status post CABG x4 in 1991, paroxysmal atrial fibrillation and not on anticoagulation due to risk of GI bleeding, hypertension, orthostatic hypotension, hyperlipidemia, simple chronic bronchitis, history of AV malformation of the stomach, history of diverticulitis, history of diverticulosis bleed, gallstone pancreatitis, iron deficiency anemia, progressive supranuclear palsy, history of BPH, inguinal hernia repair in June 2009, cholecystectomy in 1994, presented to the emergency department for chief concerns of rectal bleeding.  Clinical Impression  Patient received in bed, daughter present at bedside. Patient is HOH. Patient requires min assist for supine >< sit. He is able to stand with min assist from low bed. Patient ambulated 150 feet with RW and min assist. He requires cues for safety with mobility. Patient will continue to benefit from skilled PT while here to improve functional mobility and safety.        Follow Up Recommendations Home health PT;Supervision for mobility/OOB    Equipment Recommendations  None recommended by PT;Other (comment)    Recommendations for Other Services       Precautions / Restrictions Precautions Precautions: Fall Restrictions Weight Bearing Restrictions: No      Mobility  Bed Mobility Overal bed mobility: Needs Assistance Bed Mobility: Supine to Sit;Sit to Supine     Supine to sit: Min assist Sit to supine: Min assist   General bed mobility comments: Patient requires min assist to scoot out to edge of bed, requires min assist to bring legs back onto bed    Transfers Overall transfer level: Needs assistance Equipment used: Rolling walker (2 wheeled) Transfers: Sit to/from Stand Sit  to Stand: Min assist            Ambulation/Gait Ambulation/Gait assistance: Min assist Gait Distance (Feet): 150 Feet   Gait Pattern/deviations: Step-through pattern;Decreased step length - right;Decreased step length - left;Shuffle;Narrow base of support Gait velocity: decr   General Gait Details: patient demonstrates decreased step length and decreased foot clearance bilaterally  Stairs            Wheelchair Mobility    Modified Rankin (Stroke Patients Only)       Balance Overall balance assessment: Needs assistance Sitting-balance support: Feet supported Sitting balance-Leahy Scale: Fair     Standing balance support: Bilateral upper extremity supported;During functional activity Standing balance-Leahy Scale: Fair Standing balance comment: reliant on RW and min guard for mobility. Good static standing balance with supervision.                             Pertinent Vitals/Pain Pain Assessment: No/denies pain    Home Living Family/patient expects to be discharged to:: Private residence Living Arrangements: Spouse/significant other;Children Available Help at Discharge: Family;Available 24 hours/day Type of Home: House Home Access: Ramped entrance     Home Layout: Multi-level Home Equipment: Walker - 2 wheels      Prior Function Level of Independence: Independent with assistive device(s)         Comments: patient has daughter on property who is able to assist as needed     Hand Dominance        Extremity/Trunk Assessment   Upper Extremity Assessment Upper Extremity Assessment: Generalized weakness    Lower Extremity Assessment Lower Extremity Assessment: Generalized weakness    Cervical /  Trunk Assessment Cervical / Trunk Assessment: Kyphotic  Communication   Communication: HOH  Cognition Arousal/Alertness: Awake/alert Behavior During Therapy: WFL for tasks assessed/performed Overall Cognitive Status: Within Functional  Limits for tasks assessed                                        General Comments      Exercises     Assessment/Plan    PT Assessment Patient needs continued PT services  PT Problem List Decreased strength;Decreased mobility;Decreased activity tolerance;Decreased balance;Decreased safety awareness       PT Treatment Interventions Therapeutic exercise;Balance training;Gait training;Functional mobility training;Therapeutic activities;Patient/family education    PT Goals (Current goals can be found in the Care Plan section)  Acute Rehab PT Goals Patient Stated Goal: return home PT Goal Formulation: With patient/family Time For Goal Achievement: 04/12/20 Potential to Achieve Goals: Good    Frequency Min 2X/week   Barriers to discharge        Co-evaluation               AM-PAC PT "6 Clicks" Mobility  Outcome Measure Help needed turning from your back to your side while in a flat bed without using bedrails?: A Little Help needed moving from lying on your back to sitting on the side of a flat bed without using bedrails?: A Little Help needed moving to and from a bed to a chair (including a wheelchair)?: A Little Help needed standing up from a chair using your arms (e.g., wheelchair or bedside chair)?: A Little Help needed to walk in hospital room?: A Little Help needed climbing 3-5 steps with a railing? : A Lot 6 Click Score: 17    End of Session Equipment Utilized During Treatment: Gait belt Activity Tolerance: Patient tolerated treatment well;Patient limited by fatigue Patient left: in bed;with call bell/phone within reach;with bed alarm set;with family/visitor present;with SCD's reapplied Nurse Communication: Mobility status PT Visit Diagnosis: Muscle weakness (generalized) (M62.81);Other abnormalities of gait and mobility (R26.89);Difficulty in walking, not elsewhere classified (R26.2);History of falling (Z91.81);Unsteadiness on feet (R26.81)     Time: 8502-7741 PT Time Calculation (min) (ACUTE ONLY): 32 min   Charges:   PT Evaluation $PT Eval Moderate Complexity: 1 Mod PT Treatments $Gait Training: 8-22 mins        Taitum Alms, PT, GCS 04/05/20,2:32 PM

## 2020-04-05 NOTE — Progress Notes (Signed)
PROGRESS NOTE    Tyler Stout  JSE:[id number] DOB: Nov 15, 1925 DOA: 04/04/2020 PCP: [name], MD   Brief Narrative: 87 year old with past medical history significant for hypertension CAD status post CABG times 05/01/1989, paroxysmal A. 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  Evaluation in the ED vitals are stable, hemoglobin 11 patient hemoglobin baseline around 10.  She was admitted for further evaluation of GI bleed.  GI was consulted plan is supportive care of the start  Assessment & Plan:   Principal Problem:   Lower GI bleed Active Problems:   Atrial fibrillation (HCC)   Tremor   Essential hypertension   HLD (hyperlipidemia)   BPH (benign prostatic hyperplasia)   History of GI diverticular bleed   1-Hematochezia, lower GI bleed likely secondary to diverticular bleed:\- Hb  has decreased from 11-9.  Plan to continue to monitor Patient had bowel movement this morning reported to be brown stool Continue to hold aspirin. Per GI if patient develop worsening bleeding we can get a tagged red blood cell scan Diet to full liquid diet  2-acute blood loss anemia: Secondary to #1 see as above  3-hypertension: Continue with eprosartan, metoprolol and furosemide 4-CAD holding aspirin.  Continue with the statins 5-BPH: Continue with tamsulosin Possible progressive supranuclear palsy: Continue with Sinemet and rivastigmine Frequent  fall: PT eval    Estimated body mass index is 20.63 kg/m as calculated from the following:   Height as of this encounter: 5\' 5"  (1.651 m).   Weight as of this encounter: 56.2 kg.   DVT prophylaxis: SCD Code Status: Full code Family  Communication: Daughter who was at bedside Disposition Plan:  Status is: Observation  The patient remains OBS appropriate and will d/c before 2 midnights.  Dispo: The patient is from: Home              Anticipated d/c is to: Home              Patient currently is not medically stable to d/c.   Difficult to place patient No        Consultants:   GI  Procedures:   None  Antimicrobials:    Subjective: He denies abdominal pain. Per nurse report patient had brown BM today   Objective: Vitals:   04/04/20 1600 04/04/20 1643 04/04/20 2123 04/05/20 0453  BP: (!) 176/152 (!) 163/79 (!) 142/70 (!) 131/53  Pulse: 82 61 72 64  Resp:  16 16 16   Temp:  97.8 F (36.6 C) 98.5 F (36.9 C) 98.4 F (36.9 C)  TempSrc:  Oral Oral   SpO2: 100% 100% 100% 98%  Weight:      Height:       No intake or output data in the 24 hours ending 04/05/20 0736 Filed Weights   04/04/20 1102  Weight: 56.2 kg    Examination:  General exam: Appears calm and comfortable  Respiratory system: Clear to auscultation. Respiratory effort normal. Cardiovascular system: S1 & S2 heard, RRR. Gastrointestinal system: Abdomen is nondistended, soft and nontender. No organomegaly or masses felt. Normal bowel sounds heard. Central nervous system: Alert and oriented.  Extremities: Symmetric 5 x 5 power.    Data Reviewed: I have personally reviewed following labs and imaging studies  CBC: Recent Labs  Lab 04/04/20 1105 04/05/20 0501  WBC 5.6 4.9  HGB 11.2* 9.8*  HCT 33.8* 29.6*  MCV 96.0 95.5  PLT 194 509   Basic Metabolic Panel: Recent Labs  Lab 04/04/20 1105 04/05/20 0501  NA 137 135  K 4.7 4.2  CL 104 103  CO2 25 25  GLUCOSE 100* 81  BUN 16 12  CREATININE 0.85 0.72  CALCIUM 9.0 8.8*   GFR: Estimated Creatinine Clearance: 46.8 mL/min (by C-G formula based on SCr of 0.72 mg/dL). Liver Function Tests: Recent Labs  Lab 04/04/20 1105  AST 22  ALT 9  ALKPHOS 94  BILITOT 0.8  PROT  6.9  ALBUMIN 4.1   No results for input(s): LIPASE, AMYLASE in the last 168 hours. No results for input(s): AMMONIA in the last 168 hours. Coagulation Profile: Recent Labs  Lab 04/04/20 1105  INR 1.1   Cardiac Enzymes: No results for input(s): CKTOTAL, CKMB, CKMBINDEX, TROPONINI in the last 168 hours. BNP (last 3 results) No results for input(s): PROBNP in the last 8760 hours. HbA1C: No results for input(s): HGBA1C in the last 72 hours. CBG: No results for input(s): GLUCAP in the last 168 hours. Lipid Profile: No results for input(s): CHOL, HDL, LDLCALC, TRIG, CHOLHDL, LDLDIRECT in the last 72 hours. Thyroid Function Tests: No results for input(s): TSH, T4TOTAL, FREET4, T3FREE, THYROIDAB in the last 72 hours. Anemia Panel: No results for input(s): VITAMINB12, FOLATE, FERRITIN, TIBC, IRON, RETICCTPCT in the last 72 hours. Sepsis Labs: No results for input(s): PROCALCITON, LATICACIDVEN in the last 168 hours.  Recent Results (from the past 240 hour(s))  Resp Panel by RT-PCR (Flu A&B, Covid) Nasopharyngeal Swab     Status: None   Collection Time: 04/04/20  3:39 PM   Specimen: Nasopharyngeal Swab; Nasopharyngeal(NP) swabs in vial transport medium  Result Value Ref Range Status   SARS Coronavirus 2 by RT PCR NEGATIVE NEGATIVE Final    Comment: (NOTE) SARS-CoV-2 target nucleic acids are NOT DETECTED.  The SARS-CoV-2 RNA is generally detectable in upper respiratory specimens during the acute phase of infection. The lowest concentration of SARS-CoV-2 viral copies this assay can detect is 138 copies/mL. A negative result does not preclude SARS-Cov-2 infection and should not be used as the sole basis for treatment or other patient management decisions. A negative result may occur with  improper specimen collection/handling, submission of specimen other than nasopharyngeal swab, presence of viral mutation(s) within the areas targeted by this assay, and inadequate number of  viral copies(<138 copies/mL). A negative result must be combined with clinical observations, patient history, and epidemiological information. The expected result is Negative.  Fact Sheet for Patients:  EntrepreneurPulse.com.au  Fact Sheet for Healthcare Providers:  IncredibleEmployment.be  This test is no t yet approved or cleared by the [place] FDA and  has been authorized for detection and/or diagnosis of SARS-CoV-2 by FDA under an Emergency Use Authorization (EUA). This EUA will remain  in effect (meaning this test can be used) for the duration of the COVID-19 declaration under Section 564(b)(1) of the Act, 21 U.S.C.section 360bbb-3(b)(1), unless the authorization is terminated  or revoked sooner.       Influenza A by PCR NEGATIVE NEGATIVE Final   Influenza B by PCR NEGATIVE NEGATIVE Final    Comment: (NOTE) The Xpert Xpress SARS-CoV-2/FLU/RSV plus assay is intended as an aid in the diagnosis of influenza from Nasopharyngeal swab specimens and should not be used as a sole basis for treatment. Nasal washings and  aspirates are unacceptable for Xpert Xpress SARS-CoV-2/FLU/RSV testing.  Fact Sheet for Patients: EntrepreneurPulse.com.au  Fact Sheet for Healthcare Providers: IncredibleEmployment.be  This test is not yet approved or cleared by the [place] FDA and has been authorized for detection and/or diagnosis of SARS-CoV-2 by FDA under an Emergency Use Authorization (EUA). This EUA will remain in effect (meaning this test can be used) for the duration of the COVID-19 declaration under Section 564(b)(1) of the Act, 21 U.S.C. section 360bbb-3(b)(1), unless the authorization is terminated or revoked.  Performed at [facility], [address], [place], [place] [address]          Radiology Studies: No results found.      Scheduled Meds: . ascorbic acid  250 mg Oral  Daily  . atorvastatin  20 mg Oral QHS  . carbidopa-levodopa  1 tablet Oral QHS  . carbidopa-levodopa  2 tablet Oral TID  . cholecalciferol  2,000 Units Oral QPM  . citalopram  20 mg Oral Daily  . fluticasone furoate-vilanterol  1 puff Inhalation Daily  . furosemide  20 mg Oral Once per day on Mon Thu  . guaiFENesin  600 mg Oral BID  . irbesartan  75 mg Oral Daily  . latanoprost  1 drop Both Eyes QHS  . metoprolol tartrate  25 mg Oral BID  . multivitamin with minerals  1 tablet Oral QPM  . rivastigmine  4.6 mg Transdermal Daily  . tamsulosin  0.4 mg Oral Daily   Continuous Infusions:   LOS: 0 days    Time spent: 35 minutes    Haydyn Girvan A Casaundra Takacs, MD [facility]   If 7PM-7AM, please contact night-coverage [email or web address]  04/05/2020, 7:36 AM

## 2020-04-06 DIAGNOSIS — K922 Gastrointestinal hemorrhage, unspecified: Secondary | ICD-10-CM | POA: Diagnosis not present

## 2020-04-06 LAB — CBC
HCT: 32.3 % — ABNORMAL LOW (ref 39.0–52.0)
Hemoglobin: 10.7 g/dL — ABNORMAL LOW (ref 13.0–17.0)
MCH: 31.2 pg (ref 26.0–34.0)
MCHC: 33.1 g/dL (ref 30.0–36.0)
MCV: 94.2 fL (ref 80.0–100.0)
Platelets: 191 10*3/uL (ref 150–400)
RBC: 3.43 MIL/uL — ABNORMAL LOW (ref 4.22–5.81)
RDW: 14.7 % (ref 11.5–15.5)
WBC: 6.5 10*3/uL (ref 4.0–10.5)
nRBC: 0 % (ref 0.0–0.2)

## 2020-04-06 NOTE — Progress Notes (Signed)
Patient adequate for discharge. VSS, no concerns at this time.

## 2020-04-06 NOTE — Discharge Summary (Signed)
Physician Discharge Summary  Tyler Stout YQM:[id number] DOB: March 27, 1925 DOA: 04/04/2020  PCP: [name], MD  Admit date: 04/04/2020 Discharge date: 04/06/2020  Admitted From: Home  Disposition:  Home   Recommendations for Outpatient Follow-up:  1. Follow up with PCP in 1-2 weeks 2. Please obtain BMP/CBC in one week 3. Need CBC to follow HB level.   Home Health: [facility]  Discharge Condition: Stable.  CODE STATUS: Full code Diet recommendation: Heart Healthy   Brief/Interim Summary: 87 year old with past medical history significant for hypertension CAD status post CABG times 05/01/1989, paroxysmal A. 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  Evaluation in the ED vitals are stable, hemoglobin 11 patient hemoglobin baseline around 10.  She was admitted for further evaluation of GI bleed.  GI was consulted plan is supportive care of the start   1-Hematochezia, lower GI bleed likely secondary to diverticular bleed:\- Hb  has decreased from 11-9.  Hb stable today at 10/.  Continue to hold aspirin. Per GI if patient develop worsening bleeding we can get a tagged red blood cell scan Tolerating diet. Hb remain stable, no further Bleeding. Plan to discharge home today. Needs repeat Hb in 1 week.   2-Acute blood loss anemia: Secondary to #1 see as above Hb stable today.   3-Hypertension: Continue with eprosartan, metoprolol and furosemide 4-CAD holding aspirin at discharge. Follow up Hb in week , to determine if aspirin can be resume.   Continue with the statins 5-BPH: Continue with tamsulosin Possible progressive supranuclear palsy: Continue with Sinemet and rivastigmine Frequent   fall: PT eval. HH PT>     Discharge Diagnoses:  Principal Problem:   Lower GI bleed Active Problems:   Atrial fibrillation (HCC)   Tremor   Essential hypertension   HLD (hyperlipidemia)   BPH (benign prostatic hyperplasia)   History of GI diverticular bleed    Discharge Instructions  Discharge Instructions    Diet - low sodium heart healthy   Complete by: As directed    Increase activity slowly   Complete by: As directed      Allergies as of 04/06/2020      Reactions   Ilosone [erythromycin] Other (See Comments)   Reaction:  GI upset    Penicillins Rash, Other (See Comments)   Has patient had a PCN reaction causing immediate rash, facial/tongue/throat swelling, SOB or lightheadedness with hypotension: No Has patient had a PCN reaction causing severe rash involving mucus membranes or skin necrosis: No Has patient had a PCN reaction that required hospitalization No Has patient had a PCN reaction occurring within the last 10 years: No If all of the above answers are "NO", then may proceed with Cephalosporin use.      Medication List    STOP taking these medications   aspirin EC 81 MG tablet     TAKE these medications   albuterol 108 (90 Base) MCG/ACT inhaler Commonly known as: VENTOLIN HFA Inhale 2 puffs into the lungs every 6 (six) hours as needed for wheezing or shortness of breath.   ascorbic acid 250 MG Chew Commonly known as: VITAMIN C Chew 250 mg by mouth daily.   atorvastatin 20 MG tablet Commonly known as: LIPITOR TAKE 1 TABLET (20 MG TOTAL)  BY MOUTH DAILY.   azelastine 0.1 % nasal spray Commonly known as: ASTELIN PLACE 1 SPRAY INTO BOTH NOSTRILS 2 (TWO) TIMES DAILY What changed: See the new instructions.   Breo Ellipta 200-25 MCG/INH Aepb Generic drug: fluticasone furoate-vilanterol Inhale 1 puff into the lungs daily.   carbidopa-levodopa 50-200 MG tablet Commonly known as: SINEMET CR Take 1 tablet by mouth at bedtime.   carbidopa-levodopa  25-100 MG tablet Commonly known as: SINEMET IR Take 2 tablets by mouth 3 (three) times daily.   citalopram 20 MG tablet Commonly known as: CELEXA TAKE 1 TABLET (20 MG TOTAL) BY MOUTH DAILY.   fluticasone 50 MCG/ACT nasal spray Commonly known as: FLONASE SPRAY 2 SPRAYS INTO EACH NOSTRIL EVERY DAY What changed: See the new instructions.   furosemide 20 MG tablet Commonly known as: LASIX Take 20 mg by mouth daily as needed.   guaiFENesin 600 MG 12 hr tablet Commonly known as: MUCINEX Take 600 mg by mouth daily.   latanoprost 0.005 % ophthalmic solution Commonly known as: XALATAN Place 1 drop into both eyes at bedtime.   metoprolol tartrate 25 MG tablet Commonly known as: LOPRESSOR Take 25 mg by mouth 2 (two) times daily.   multivitamin with minerals Tabs tablet Take 1 tablet by mouth every evening.   potassium chloride 10 MEQ tablet Commonly known as: KLOR-CON Take one tablet by mouth with lasix. What changed:   how much to take  how to take this  when to take this  reasons to take this  additional instructions   rivastigmine 4.6 mg/24hr Commonly known as: EXELON Place 1 patch onto the skin daily.   tamsulosin 0.4 MG Caps capsule Commonly known as: FLOMAX TAKE 1 CAPSULE BY MOUTH EVERY DAY   telmisartan 20 MG tablet Commonly known as: MICARDIS Take 20 mg by mouth daily.   Vitamin D 50 MCG (2000 UT) tablet Take 2,000 Units by mouth every evening.       Allergies  Allergen Reactions  . Ilosone [Erythromycin] Other (See Comments)    Reaction:  GI upset   . Penicillins Rash and Other (See Comments)    Has patient had a PCN reaction causing immediate rash, facial/tongue/throat swelling, SOB or lightheadedness with hypotension: No Has patient had a PCN reaction causing severe rash involving mucus membranes or skin necrosis: No Has patient had a PCN reaction that required hospitalization No Has patient had a PCN reaction occurring within the last 10  years: No If all of the above answers are "NO", then may proceed with Cephalosporin use.    Consultations:  Dr Alice Reichert   Procedures/Studies: DG Ribs Unilateral W/Chest Left  Result Date: 03/13/2020 CLINICAL DATA:  Left lateral rib pain after fall EXAM: LEFT RIBS AND CHEST - 3+ VIEW COMPARISON:  01/04/2019 FINDINGS: Frontal view of the chest as well as frontal and oblique views of the left thoracic cage are obtained. Cardiac silhouette is unremarkable. Postsurgical changes from CABG. No acute airspace disease, effusion, or pneumothorax. Pleural calcification left hemidiaphragm unchanged. No acute displaced fracture. IMPRESSION: 1. No acute intrathoracic process.  No acute bony abnormality. Electronically Signed   By: Randa Ngo M.D.   On: 03/13/2020 23:05   CT Head Wo Contrast  Result Date: 03/13/2020 CLINICAL DATA:  Status post fall. EXAM: CT HEAD WITHOUT CONTRAST TECHNIQUE: Contiguous axial images were obtained from the base of the skull through the vertex without intravenous contrast. COMPARISON:  March 06, 2015 FINDINGS: Brain: There is moderate severity cerebral atrophy with widening of  the extra-axial spaces and ventricular dilatation. There are areas of decreased attenuation within the white matter tracts of the supratentorial brain, consistent with microvascular disease changes. Vascular: No hyperdense vessel or unexpected calcification. Skull: Normal. Negative for fracture or focal lesion. Sinuses/Orbits: No acute finding. Other: There is mild left frontal scalp soft tissue swelling. IMPRESSION: 1. Mild left frontal scalp soft tissue swelling without evidence of an acute fracture or acute intracranial abnormality. 2. Moderate severity cerebral atrophy and microvascular disease changes of the supratentorial brain. Electronically Signed   By: Virgina Norfolk M.D.   On: 03/13/2020 21:22   CT Cervical Spine Wo Contrast  Result Date: 03/13/2020 CLINICAL DATA:  Status post fall. EXAM: CT  CERVICAL SPINE WITHOUT CONTRAST TECHNIQUE: Multidetector CT imaging of the cervical spine was performed without intravenous contrast. Multiplanar CT image reconstructions were also generated. COMPARISON:  None. FINDINGS: Alignment: 1 mm to 2 mm retrolisthesis of the C4 vertebral body is noted on C5. Skull base and vertebrae: No acute fracture. No primary bone lesion or focal pathologic process. Soft tissues and spinal canal: No prevertebral fluid or swelling. No visible canal hematoma. Disc levels: Moderate severity endplate sclerosis is seen at the levels of C4-C5, C5-C6, C6-C7 and C7-T1. Marked severity narrowing of the anterior atlantoaxial articulation is seen. Marked severity intervertebral disc space narrowing is also seen at the levels of C4-C5, C5-C6, C6-C7 and C7-T1. Mild, bilateral multilevel facet joint hypertrophy is noted. Upper chest: Negative. Mild biapical scarring and/or atelectasis is seen. Other: None. IMPRESSION: 1. Marked severity multilevel degenerative changes, most prominent at the levels of C4-C5, C5-C6, C6-C7 and C7-T1. 2. 1 mm to 2 mm retrolisthesis of the C4 vertebral body on C5. 3. No acute cervical spine fracture. Electronically Signed   By: Virgina Norfolk M.D.   On: 03/13/2020 21:26      Subjective: He is feeling well, no further bloody stool.   Discharge Exam: Vitals:   04/06/20 0559 04/06/20 0729  BP: (!) 144/60 (!) 143/59  Pulse: (!) 56 62  Resp: 17 18  Temp: 97.6 F (36.4 C) 98.1 F (36.7 C)  SpO2: 97% 96%     General: Pt is alert, awake, not in acute distress Cardiovascular: RRR, S1/S2 +, no rubs, no gallops Respiratory: CTA bilaterally, no wheezing, no rhonchi Abdominal: Soft, NT, ND, bowel sounds + Extremities: no edema, no cyanosis    The results of significant diagnostics from this hospitalization (including imaging, microbiology, ancillary and laboratory) are listed below for reference.     Microbiology: Recent Results (from the past 240  hour(s))  Resp Panel by RT-PCR (Flu A&B, Covid) Nasopharyngeal Swab     Status: None   Collection Time: 04/04/20  3:39 PM   Specimen: Nasopharyngeal Swab; Nasopharyngeal(NP) swabs in vial transport medium  Result Value Ref Range Status   SARS Coronavirus 2 by RT PCR NEGATIVE NEGATIVE Final    Comment: (NOTE) SARS-CoV-2 target nucleic acids are NOT DETECTED.  The SARS-CoV-2 RNA is generally detectable in upper respiratory specimens during the acute phase of infection. The lowest concentration of SARS-CoV-2 viral copies this assay can detect is 138 copies/mL. A negative result does not preclude SARS-Cov-2 infection and should not be used as the sole basis for treatment or other patient management decisions. A negative result may occur with  improper specimen collection/handling, submission of specimen other than nasopharyngeal swab, presence of viral mutation(s) within the areas targeted by this assay, and inadequate number of viral copies(<138 copies/mL). A negative result must be combined  with clinical observations, patient history, and epidemiological information. The expected result is Negative.  Fact Sheet for Patients:  EntrepreneurPulse.com.au  Fact Sheet for Healthcare Providers:  IncredibleEmployment.be  This test is no t yet approved or cleared by the [place] FDA and  has been authorized for detection and/or diagnosis of SARS-CoV-2 by FDA under an Emergency Use Authorization (EUA). This EUA will remain  in effect (meaning this test can be used) for the duration of the COVID-19 declaration under Section 564(b)(1) of the Act, 21 U.S.C.section 360bbb-3(b)(1), unless the authorization is terminated  or revoked sooner.       Influenza A by PCR NEGATIVE NEGATIVE Final   Influenza B by PCR NEGATIVE NEGATIVE Final    Comment: (NOTE) The Xpert Xpress SARS-CoV-2/FLU/RSV plus assay is intended as an aid in the diagnosis of influenza from  Nasopharyngeal swab specimens and should not be used as a sole basis for treatment. Nasal washings and aspirates are unacceptable for Xpert Xpress SARS-CoV-2/FLU/RSV testing.  Fact Sheet for Patients: EntrepreneurPulse.com.au  Fact Sheet for Healthcare Providers: IncredibleEmployment.be  This test is not yet approved or cleared by the [place] FDA and has been authorized for detection and/or diagnosis of SARS-CoV-2 by FDA under an Emergency Use Authorization (EUA). This EUA will remain in effect (meaning this test can be used) for the duration of the COVID-19 declaration under Section 564(b)(1) of the Act, 21 U.S.C. section 360bbb-3(b)(1), unless the authorization is terminated or revoked.  Performed at [facility], [address] [place], [place] [address]      Labs: BNP (last 3 results) No results for input(s): BNP in the last 8760 hours. Basic Metabolic Panel: Recent Labs  Lab 04/04/20 1105 04/05/20 0501  NA 137 135  K 4.7 4.2  CL 104 103  CO2 25 25  GLUCOSE 100* 81  BUN 16 12  CREATININE 0.85 0.72  CALCIUM 9.0 8.8*   Liver Function Tests: Recent Labs  Lab 04/04/20 1105  AST 22  ALT 9  ALKPHOS 94  BILITOT 0.8  PROT 6.9  ALBUMIN 4.1   No results for input(s): LIPASE, AMYLASE in the last 168 hours. No results for input(s): AMMONIA in the last 168 hours. CBC: Recent Labs  Lab 04/04/20 1105 04/05/20 0501 04/05/20 1816 04/06/20 0531  WBC 5.6 4.9  --  6.5  HGB 11.2* 9.8* 10.2* 10.7*  HCT 33.8* 29.6* 30.4* 32.3*  MCV 96.0 95.5  --  94.2  PLT 194 174  --  191   Cardiac Enzymes: No results for input(s): CKTOTAL, CKMB, CKMBINDEX, TROPONINI in the last 168 hours. BNP: Invalid input(s): POCBNP CBG: No results for input(s): GLUCAP in the last 168 hours. D-Dimer No results for input(s): DDIMER in the last 72 hours. Hgb A1c No results for input(s): HGBA1C in the last 72 hours. Lipid Profile No  results for input(s): CHOL, HDL, LDLCALC, TRIG, CHOLHDL, LDLDIRECT in the last 72 hours. Thyroid function studies No results for input(s): TSH, T4TOTAL, T3FREE, THYROIDAB in the last 72 hours.  Invalid input(s): FREET3 Anemia work up No results for input(s): VITAMINB12, FOLATE, FERRITIN, TIBC, IRON, RETICCTPCT in the last 72 hours. Urinalysis    Component Value Date/Time   COLORURINE YELLOW (A) 07/28/2018 1603   APPEARANCEUR CLEAR (A) 07/28/2018 1603   APPEARANCEUR Hazy 03/01/2012 0720   LABSPEC 1.014 07/28/2018 1603   LABSPEC 1.016 03/01/2012 0720   PHURINE 8.0 07/28/2018 1603   GLUCOSEU NEGATIVE 07/28/2018 1603   GLUCOSEU NEGATIVE 06/27/2016 1221   HGBUR SMALL (  A) 07/28/2018 1603   BILIRUBINUR NEGATIVE 07/28/2018 1603   BILIRUBINUR Negative 03/01/2012 0720   KETONESUR NEGATIVE 07/28/2018 1603   PROTEINUR 30 (A) 07/28/2018 1603   UROBILINOGEN 0.2 06/27/2016 1221   NITRITE NEGATIVE 07/28/2018 1603   LEUKOCYTESUR NEGATIVE 07/28/2018 1603   LEUKOCYTESUR Negative 03/01/2012 0720   Sepsis Labs Invalid input(s): PROCALCITONIN,  WBC,  LACTICIDVEN Microbiology Recent Results (from the past 240 hour(s))  Resp Panel by RT-PCR (Flu A&B, Covid) Nasopharyngeal Swab     Status: None   Collection Time: 04/04/20  3:39 PM   Specimen: Nasopharyngeal Swab; Nasopharyngeal(NP) swabs in vial transport medium  Result Value Ref Range Status   SARS Coronavirus 2 by RT PCR NEGATIVE NEGATIVE Final    Comment: (NOTE) SARS-CoV-2 target nucleic acids are NOT DETECTED.  The SARS-CoV-2 RNA is generally detectable in upper respiratory specimens during the acute phase of infection. The lowest concentration of SARS-CoV-2 viral copies this assay can detect is 138 copies/mL. A negative result does not preclude SARS-Cov-2 infection and should not be used as the sole basis for treatment or other patient management decisions. A negative result may occur with  improper specimen collection/handling, submission  of specimen other than nasopharyngeal swab, presence of viral mutation(s) within the areas targeted by this assay, and inadequate number of viral copies(<138 copies/mL). A negative result must be combined with clinical observations, patient history, and epidemiological information. The expected result is Negative.  Fact Sheet for Patients:  EntrepreneurPulse.com.au  Fact Sheet for Healthcare Providers:  IncredibleEmployment.be  This test is no t yet approved or cleared by the [place] FDA and  has been authorized for detection and/or diagnosis of SARS-CoV-2 by FDA under an Emergency Use Authorization (EUA). This EUA will remain  in effect (meaning this test can be used) for the duration of the COVID-19 declaration under Section 564(b)(1) of the Act, 21 U.S.C.section 360bbb-3(b)(1), unless the authorization is terminated  or revoked sooner.       Influenza A by PCR NEGATIVE NEGATIVE Final   Influenza B by PCR NEGATIVE NEGATIVE Final    Comment: (NOTE) The Xpert Xpress SARS-CoV-2/FLU/RSV plus assay is intended as an aid in the diagnosis of influenza from Nasopharyngeal swab specimens and should not be used as a sole basis for treatment. Nasal washings and aspirates are unacceptable for Xpert Xpress SARS-CoV-2/FLU/RSV testing.  Fact Sheet for Patients: EntrepreneurPulse.com.au  Fact Sheet for Healthcare Providers: IncredibleEmployment.be  This test is not yet approved or cleared by the [place] FDA and has been authorized for detection and/or diagnosis of SARS-CoV-2 by FDA under an Emergency Use Authorization (EUA). This EUA will remain in effect (meaning this test can be used) for the duration of the COVID-19 declaration under Section 564(b)(1) of the Act, 21 U.S.C. section 360bbb-3(b)(1), unless the authorization is terminated or revoked.  Performed at [facility], [address], [place], [place] [address]      Time coordinating discharge: 40 minutes  SIGNED:   Elmarie Shiley, MD  [facility]

## 2020-04-07 DIAGNOSIS — I951 Orthostatic hypotension: Secondary | ICD-10-CM | POA: Diagnosis not present

## 2020-04-07 DIAGNOSIS — J410 Simple chronic bronchitis: Secondary | ICD-10-CM | POA: Diagnosis not present

## 2020-04-07 DIAGNOSIS — I10 Essential (primary) hypertension: Secondary | ICD-10-CM | POA: Diagnosis not present

## 2020-04-07 DIAGNOSIS — K625 Hemorrhage of anus and rectum: Secondary | ICD-10-CM | POA: Diagnosis not present

## 2020-04-07 DIAGNOSIS — I2510 Atherosclerotic heart disease of native coronary artery without angina pectoris: Secondary | ICD-10-CM | POA: Diagnosis not present

## 2020-04-07 DIAGNOSIS — N401 Enlarged prostate with lower urinary tract symptoms: Secondary | ICD-10-CM | POA: Diagnosis not present

## 2020-04-07 DIAGNOSIS — G231 Progressive supranuclear ophthalmoplegia [Steele-Richardson-Olszewski]: Secondary | ICD-10-CM | POA: Diagnosis not present

## 2020-04-07 DIAGNOSIS — I480 Paroxysmal atrial fibrillation: Secondary | ICD-10-CM | POA: Diagnosis not present

## 2020-04-07 DIAGNOSIS — E785 Hyperlipidemia, unspecified: Secondary | ICD-10-CM | POA: Diagnosis not present

## 2020-04-07 DIAGNOSIS — N39498 Other specified urinary incontinence: Secondary | ICD-10-CM | POA: Diagnosis not present

## 2020-04-07 DIAGNOSIS — D509 Iron deficiency anemia, unspecified: Secondary | ICD-10-CM | POA: Diagnosis not present

## 2020-04-07 DIAGNOSIS — Z951 Presence of aortocoronary bypass graft: Secondary | ICD-10-CM | POA: Diagnosis not present

## 2020-04-07 LAB — TYPE AND SCREEN
ABO/RH(D): O POS
Antibody Screen: NEGATIVE

## 2020-04-10 ENCOUNTER — Ambulatory Visit (INDEPENDENT_AMBULATORY_CARE_PROVIDER_SITE_OTHER): Payer: Medicare HMO | Admitting: Internal Medicine

## 2020-04-10 ENCOUNTER — Other Ambulatory Visit: Payer: Self-pay

## 2020-04-10 ENCOUNTER — Encounter: Payer: Self-pay | Admitting: Internal Medicine

## 2020-04-10 DIAGNOSIS — E7800 Pure hypercholesterolemia, unspecified: Secondary | ICD-10-CM | POA: Diagnosis not present

## 2020-04-10 DIAGNOSIS — K625 Hemorrhage of anus and rectum: Secondary | ICD-10-CM | POA: Diagnosis not present

## 2020-04-10 DIAGNOSIS — I482 Chronic atrial fibrillation, unspecified: Secondary | ICD-10-CM

## 2020-04-10 DIAGNOSIS — J411 Mucopurulent chronic bronchitis: Secondary | ICD-10-CM | POA: Diagnosis not present

## 2020-04-10 DIAGNOSIS — I429 Cardiomyopathy, unspecified: Secondary | ICD-10-CM

## 2020-04-10 DIAGNOSIS — R739 Hyperglycemia, unspecified: Secondary | ICD-10-CM

## 2020-04-10 DIAGNOSIS — I700 Atherosclerosis of aorta: Secondary | ICD-10-CM | POA: Diagnosis not present

## 2020-04-10 DIAGNOSIS — D508 Other iron deficiency anemias: Secondary | ICD-10-CM | POA: Diagnosis not present

## 2020-04-10 DIAGNOSIS — I10 Essential (primary) hypertension: Secondary | ICD-10-CM

## 2020-04-10 DIAGNOSIS — G231 Progressive supranuclear ophthalmoplegia [Steele-Richardson-Olszewski]: Secondary | ICD-10-CM

## 2020-04-10 LAB — BASIC METABOLIC PANEL
BUN: 21 mg/dL (ref 6–23)
CO2: 28 mEq/L (ref 19–32)
Calcium: 8.6 mg/dL (ref 8.4–10.5)
Chloride: 102 mEq/L (ref 96–112)
Creatinine, Ser: 0.79 mg/dL (ref 0.40–1.50)
GFR: 76.98 mL/min (ref >60.00)
Glucose, Bld: 100 mg/dL — ABNORMAL HIGH (ref 70–99)
Potassium: 4.3 mEq/L (ref 3.5–5.1)
Sodium: 135 mEq/L (ref 135–145)

## 2020-04-10 LAB — IBC + FERRITIN
Ferritin: 54.1 ng/mL (ref 22.0–322.0)
Iron: 53 ug/dL (ref 42–165)
Saturation Ratios: 14.2 % — ABNORMAL LOW (ref 20.0–50.0)
Transferrin: 267 mg/dL (ref 212.0–360.0)

## 2020-04-10 LAB — HEPATIC FUNCTION PANEL
ALT: 5 U/L (ref 0–53)
AST: 20 U/L (ref 0–37)
Albumin: 3.9 g/dL (ref 3.5–5.2)
Alkaline Phosphatase: 101 U/L (ref 39–117)
Bilirubin, Direct: 0.2 mg/dL (ref 0.0–0.3)
Total Bilirubin: 0.6 mg/dL (ref 0.2–1.2)
Total Protein: 6 g/dL (ref 6.0–8.3)

## 2020-04-10 LAB — CBC WITH DIFFERENTIAL/PLATELET
Basophils Absolute: 0 10*3/uL (ref 0.0–0.1)
Basophils Relative: 0.9 % (ref 0.0–3.0)
Eosinophils Absolute: 0.1 10*3/uL (ref 0.0–0.7)
Eosinophils Relative: 2 % (ref 0.0–5.0)
HCT: 32.4 % — ABNORMAL LOW (ref 39.0–52.0)
Hemoglobin: 10.8 g/dL — ABNORMAL LOW (ref 13.0–17.0)
Lymphocytes Relative: 12.2 % (ref 12.0–46.0)
Lymphs Abs: 0.6 10*3/uL — ABNORMAL LOW (ref 0.7–4.0)
MCHC: 33.3 g/dL (ref 30.0–36.0)
MCV: 95.8 fl (ref 78.0–100.0)
Monocytes Absolute: 0.5 10*3/uL (ref 0.1–1.0)
Monocytes Relative: 10.3 % (ref 3.0–12.0)
Neutro Abs: 3.9 10*3/uL (ref 1.4–7.7)
Neutrophils Relative %: 74.6 % (ref 43.0–77.0)
Platelets: 214 10*3/uL (ref 150.0–400.0)
RBC: 3.38 Mil/uL — ABNORMAL LOW (ref 4.22–5.81)
RDW: 15.6 % — ABNORMAL HIGH (ref 11.5–15.5)
WBC: 5.2 10*3/uL (ref 4.0–10.5)

## 2020-04-10 LAB — LIPID PANEL
Cholesterol: 107 mg/dL (ref 0–200)
HDL: 49.5 mg/dL (ref >39.00)
LDL Cholesterol: 43 mg/dL (ref 0–99)
NonHDL: 57.86
Total CHOL/HDL Ratio: 2
Triglycerides: 74 mg/dL (ref 0.0–149.0)
VLDL: 14.8 mg/dL (ref 0.0–40.0)

## 2020-04-10 LAB — TSH: TSH: 1.94 u[IU]/mL (ref 0.35–4.50)

## 2020-04-10 LAB — VITAMIN B12: Vitamin B-12: 470 pg/mL (ref 211–911)

## 2020-04-10 LAB — HEMOGLOBIN A1C: Hgb A1c MFr Bld: 5.7 % (ref 4.6–6.5)

## 2020-04-10 NOTE — Progress Notes (Signed)
[name]: Tyler Stout, male   DOB: September 26, 1925, 87 y.o.   MRN: [id number]   Subjective:    [name]: Tyler Stout, male    DOB: 1925-06-16, 87 y.o.   MRN: [id number]  HPI This visit occurred during the SARS-CoV-2 public health emergency.  Safety protocols were in place, including screening questions prior to the visit, additional usage of staff PPE, and extensive cleaning of exam room while observing appropriate contact time as indicated for disinfecting solutions.  Patient here for hospital follow up.  He is accompanied by his daughter.  History obtained from both of them.  He was admitted 04/04/20 - 04/06/20 for GI bleed.  hgb decreased from 11 to 9.  Aspirin held.  GI consulted.  Bleeding stopped.  Per GI - if he develops worsening bleeding, recommended a tagged red blood cell scan.  hgb remained stable.  Eating.  Plans to f/u cbc outpt.  Since his discharge, he has not had any further bleeding.  Eating.  No chest pain.  Breathing stable.  No abdominal pain.  Discussed question of resuming aspirin.    Past Medical History:  Diagnosis Date  . Anemia    iron deficiency  . Arthritis   . Atrial fibrillation ([place])   . Bilateral renal cysts    worked up by Dr Tyler Stout  . BPH (benign prostatic hypertrophy)   . CAD (coronary artery disease)    s/p bypass graft x 4 (1991)  . Diverticulosis   . GERD (gastroesophageal reflux disease)   . GI bleed   . Glaucoma   . Hypercholesterolemia   . Hypertension   . Pancreatitis    x2 (1994 and 1998), presumed to be gallstone pancreatitis.  s/p ERCP and sphincterotomy   Past Surgical History:  Procedure Laterality Date  . APPENDECTOMY    . CHOLECYSTECTOMY  1994  . COLONOSCOPY N/A 07/14/2019   Procedure: COLONOSCOPY;  Surgeon: Tyler Manifold, MD;  Location: [facility] ENDOSCOPY;  Service: Endoscopy;  Laterality: N/A;  . CORONARY ARTERY BYPASS GRAFT     x 4,  1991  . EMBOLIZATION Right 05/25/2019   Procedure: EMBOLIZATION (Right Colon);  Surgeon:  Tyler Cabal, MD;  Location: [facility];  Service: Cardiovascular;  Laterality: Right;  . INGUINAL HERNIA REPAIR  2009  . open heart surgery  1991   Family History  Problem Relation Age of Onset  . Throat cancer Father        oropharyngeal cancer  . Kidney disease Sister   . Heart disease Other   . Colon cancer Neg Hx   . Prostate cancer Neg Hx    Social History   Socioeconomic History  . Marital status: Married    Spouse name: Not on file  . Number of children: Not on file  . Years of education: Not on file  . Highest education level: Not on file  Occupational History  . Not on file  Tobacco Use  . Smoking status: Former Smoker    Packs/day: 2.00    Years: 25.00    Pack years: 50.00    Types: Cigarettes    Quit date: 01/14/1964    Years since quitting: 56.2  . Smokeless tobacco: Never Used  . Tobacco comment: smoked 2 packs/day, 1 pipe and 1 cigar once in a while.  Substance and Sexual Activity  . Alcohol use: No    Alcohol/week: 0.0 standard drinks  . Drug use: No  . Sexual activity: Not on file  Other  Topics Concern  . Not on file  Social History Narrative  . Not on file   Social Determinants of Health   Financial Resource Strain: Low Risk   . Difficulty of Paying Living Expenses: Not hard at all  Food Insecurity: No Food Insecurity  . Worried About Charity fundraiser in the Last Year: Never true  . Ran Out of Food in the Last Year: Never true  Transportation Needs: No Transportation Needs  . Lack of Transportation (Medical): No  . Lack of Transportation (Non-Medical): No  Physical Activity: Insufficiently Active  . Days of Exercise per Week: 1 day  . Minutes of Exercise per Session: 60 min  Stress: No Stress Concern Present  . Feeling of Stress : Not at all  Social Connections: Unknown  . Frequency of Communication with Friends and Family: Not on file  . Frequency of Social Gatherings with Friends and Family: More than three times a week   . Attends Religious Services: Not on file  . Active Member of Clubs or Organizations: Not on file  . Attends Archivist Meetings: Not on file  . Marital Status: Married    Outpatient Encounter Medications as of 04/10/2020  Medication Sig  . albuterol (VENTOLIN HFA) 108 (90 Base) MCG/ACT inhaler Inhale 2 puffs into the lungs every 6 (six) hours as needed for wheezing or shortness of breath.  Marland Kitchen ascorbic acid (VITAMIN C) 250 MG CHEW Chew 250 mg by mouth daily.  Marland Kitchen atorvastatin (LIPITOR) 20 MG tablet TAKE 1 TABLET (20 MG TOTAL) BY MOUTH DAILY.  Marland Kitchen azelastine (ASTELIN) 0.1 % nasal spray PLACE 1 SPRAY INTO BOTH NOSTRILS 2 (TWO) TIMES DAILY (Patient taking differently: Place 1 spray into both nostrils 2 (two) times daily as needed for rhinitis or allergies.)  . BREO ELLIPTA 200-25 MCG/INH AEPB Inhale 1 puff into the lungs daily.  . carbidopa-levodopa (SINEMET CR) 50-200 MG tablet Take 1 tablet by mouth at bedtime.  . carbidopa-levodopa (SINEMET IR) 25-100 MG tablet Take 2 tablets by mouth 3 (three) times daily.  . Cholecalciferol (VITAMIN D) 50 MCG (2000 UT) tablet Take 2,000 Units by mouth every evening.   . citalopram (CELEXA) 20 MG tablet TAKE 1 TABLET (20 MG TOTAL) BY MOUTH DAILY.  . fluticasone (FLONASE) 50 MCG/ACT nasal spray SPRAY 2 SPRAYS INTO EACH NOSTRIL EVERY DAY (Patient taking differently: Place 2 sprays into both nostrils daily as needed for allergies or rhinitis.)  . furosemide (LASIX) 20 MG tablet Take 20 mg by mouth daily as needed.  Marland Kitchen guaiFENesin (MUCINEX) 600 MG 12 hr tablet Take 600 mg by mouth daily.  Marland Kitchen latanoprost (XALATAN) 0.005 % ophthalmic solution Place 1 drop into both eyes at bedtime.   . metoprolol tartrate (LOPRESSOR) 25 MG tablet Take 25 mg by mouth 2 (two) times daily.   . Multiple Vitamin (MULTIVITAMIN WITH MINERALS) TABS tablet Take 1 tablet by mouth every evening.   . potassium chloride (KLOR-CON) 10 MEQ tablet Take one tablet by mouth with lasix. (Patient  taking differently: Take 10 mEq by mouth daily as needed.)  . rivastigmine (EXELON) 4.6 mg/24hr Place 1 patch onto the skin daily.  . tamsulosin (FLOMAX) 0.4 MG CAPS capsule TAKE 1 CAPSULE BY MOUTH EVERY DAY  . telmisartan (MICARDIS) 20 MG tablet Take 20 mg by mouth daily.   No facility-administered encounter medications on file as of 04/10/2020.    Review of Systems  Constitutional: Negative for appetite change and unexpected weight change.  HENT: Negative for congestion  and sinus pressure.   Respiratory: Negative for cough and chest tightness.        Breathing stable.   Cardiovascular: Negative for chest pain, palpitations and leg swelling.  Gastrointestinal: Negative for abdominal pain, diarrhea, nausea and vomiting.       No further bleeding.   Genitourinary: Negative for difficulty urinating and dysuria.  Musculoskeletal: Negative for joint swelling and myalgias.  Skin: Negative for color change and rash.  Neurological: Negative for dizziness, light-headedness and headaches.  Psychiatric/Behavioral: Negative for agitation and dysphoric mood.       Objective:    Physical Exam Vitals reviewed.  Constitutional:      General: He is not in acute distress.    Appearance: Normal appearance. He is well-developed.  HENT:     Head: Normocephalic and atraumatic.     Right Ear: External ear normal.     Left Ear: External ear normal.  Eyes:     General: No scleral icterus.       Right eye: No discharge.        Left eye: No discharge.     Conjunctiva/sclera: Conjunctivae normal.  Cardiovascular:     Rate and Rhythm: Normal rate and regular rhythm.  Pulmonary:     Effort: Pulmonary effort is normal. No respiratory distress.     Breath sounds: Normal breath sounds.  Abdominal:     General: Bowel sounds are normal.     Palpations: Abdomen is soft.     Tenderness: There is no abdominal tenderness.  Musculoskeletal:        General: No swelling or tenderness.     Cervical back:  Neck supple. No tenderness.  Lymphadenopathy:     Cervical: No cervical adenopathy.  Skin:    Findings: No erythema or rash.  Neurological:     Mental Status: He is alert.  Psychiatric:        Mood and Affect: Mood normal.        Behavior: Behavior normal.     BP 122/60   Pulse 70   Temp (!) 96.6 F (35.9 C) (Temporal)   Resp 16   Ht '5\' 5"'  (1.651 m)   Wt 132 lb 6.4 oz (60.1 kg)   SpO2 98%   BMI 22.03 kg/m  Wt Readings from Last 3 Encounters:  04/10/20 132 lb 6.4 oz (60.1 kg)  04/04/20 124 lb (56.2 kg)  03/20/20 124 lb (56.2 kg)     Lab Results  Component Value Date   WBC 5.2 04/10/2020   HGB 10.8 (L) 04/10/2020   HCT 32.4 (L) 04/10/2020   PLT 214.0 04/10/2020   GLUCOSE 100 (H) 04/10/2020   CHOL 107 04/10/2020   TRIG 74.0 04/10/2020   HDL 49.50 04/10/2020   LDLCALC 43 04/10/2020   ALT 5 04/10/2020   AST 20 04/10/2020   NA 135 04/10/2020   K 4.3 04/10/2020   CL 102 04/10/2020   CREATININE 0.79 04/10/2020   BUN 21 04/10/2020   CO2 28 04/10/2020   TSH 1.94 04/10/2020   INR 1.1 04/04/2020   HGBA1C 5.7 04/10/2020       Assessment & Plan:   Problem List Items Addressed This Visit    Anemia    Recheck cbc and iron studies today.  Recent admission for GI bleed.        Cardiomyopathy ([place])    No evidence of volume overload on exam.  Weighing regularly.  Lasix prn.  Follow.       Chronic atrial  fibrillation ([place])    Off coumadin secondary to GI bleeds.  On metoprolol.  Was on aspirin.  Off now secondary to recent GI bleed.  D/w cardiology.       Chronic bronchitis ([facility])    Breathing stable.        Hypercholesteremia    Continue lipitor.  Check lipid panel and liver function tests with todays labs.        Hyperglycemia    Check met b and a1c.       Hypertension    Continue metoprolol and micardis.  Blood pressure stable.  Follow pressures.  Follow metabolic panel.       Progressive supranuclear palsy ([place])    Followed by neurology.        Rectal bleeding    Recent admission for GI bleed.  Off aspirin currently.  GI evaluated.  Recommended if any further bleeding, tagged red blood cell scan.  Will check cbc and iron studies today. No bleeding since discharge.       Thoracic aortic atherosclerosis ([facility])    Continue lipitor.  Off aspirin currently secondary to recent GI bleed.           [name], MD

## 2020-04-13 DIAGNOSIS — N401 Enlarged prostate with lower urinary tract symptoms: Secondary | ICD-10-CM | POA: Diagnosis not present

## 2020-04-13 DIAGNOSIS — I480 Paroxysmal atrial fibrillation: Secondary | ICD-10-CM | POA: Diagnosis not present

## 2020-04-13 DIAGNOSIS — I10 Essential (primary) hypertension: Secondary | ICD-10-CM | POA: Diagnosis not present

## 2020-04-13 DIAGNOSIS — N39498 Other specified urinary incontinence: Secondary | ICD-10-CM | POA: Diagnosis not present

## 2020-04-13 DIAGNOSIS — I951 Orthostatic hypotension: Secondary | ICD-10-CM | POA: Diagnosis not present

## 2020-04-13 DIAGNOSIS — I2510 Atherosclerotic heart disease of native coronary artery without angina pectoris: Secondary | ICD-10-CM | POA: Diagnosis not present

## 2020-04-13 DIAGNOSIS — J410 Simple chronic bronchitis: Secondary | ICD-10-CM | POA: Diagnosis not present

## 2020-04-13 DIAGNOSIS — D509 Iron deficiency anemia, unspecified: Secondary | ICD-10-CM | POA: Diagnosis not present

## 2020-04-13 DIAGNOSIS — K625 Hemorrhage of anus and rectum: Secondary | ICD-10-CM | POA: Diagnosis not present

## 2020-04-13 DIAGNOSIS — Z951 Presence of aortocoronary bypass graft: Secondary | ICD-10-CM | POA: Diagnosis not present

## 2020-04-13 DIAGNOSIS — E785 Hyperlipidemia, unspecified: Secondary | ICD-10-CM | POA: Diagnosis not present

## 2020-04-13 DIAGNOSIS — G231 Progressive supranuclear ophthalmoplegia [Steele-Richardson-Olszewski]: Secondary | ICD-10-CM | POA: Diagnosis not present

## 2020-04-15 ENCOUNTER — Encounter: Payer: Self-pay | Admitting: Internal Medicine

## 2020-04-15 NOTE — Assessment & Plan Note (Signed)
Continue metoprolol and micardis.  Blood pressure stable.  Follow pressures.  Follow metabolic panel.

## 2020-04-15 NOTE — Assessment & Plan Note (Signed)
Continue lipitor.  Check lipid panel and liver function tests with todays labs.

## 2020-04-15 NOTE — Assessment & Plan Note (Signed)
Off coumadin secondary to GI bleeds.  On metoprolol.  Was on aspirin.  Off now secondary to recent GI bleed.  D/w cardiology.

## 2020-04-15 NOTE — Assessment & Plan Note (Signed)
Continue lipitor.  Off aspirin currently secondary to recent GI bleed.

## 2020-04-15 NOTE — Assessment & Plan Note (Signed)
No evidence of volume overload on exam.  Weighing regularly.  Lasix prn.  Follow.

## 2020-04-15 NOTE — Assessment & Plan Note (Signed)
Breathing stable.

## 2020-04-15 NOTE — Assessment & Plan Note (Signed)
Recheck cbc and iron studies today.  Recent admission for GI bleed.

## 2020-04-15 NOTE — Assessment & Plan Note (Signed)
Followed by neurology.   

## 2020-04-15 NOTE — Assessment & Plan Note (Signed)
Check met b and a1c   

## 2020-04-15 NOTE — Assessment & Plan Note (Addendum)
Recent admission for GI bleed.  Off aspirin currently.  GI evaluated.  Recommended if any further bleeding, tagged red blood cell scan.  Will check cbc and iron studies today. No bleeding since discharge.

## 2020-04-16 ENCOUNTER — Other Ambulatory Visit: Payer: Medicare HMO

## 2020-04-17 DIAGNOSIS — D509 Iron deficiency anemia, unspecified: Secondary | ICD-10-CM | POA: Diagnosis not present

## 2020-04-17 DIAGNOSIS — I2510 Atherosclerotic heart disease of native coronary artery without angina pectoris: Secondary | ICD-10-CM | POA: Diagnosis not present

## 2020-04-17 DIAGNOSIS — N39498 Other specified urinary incontinence: Secondary | ICD-10-CM | POA: Diagnosis not present

## 2020-04-17 DIAGNOSIS — K625 Hemorrhage of anus and rectum: Secondary | ICD-10-CM | POA: Diagnosis not present

## 2020-04-17 DIAGNOSIS — J410 Simple chronic bronchitis: Secondary | ICD-10-CM | POA: Diagnosis not present

## 2020-04-17 DIAGNOSIS — Z951 Presence of aortocoronary bypass graft: Secondary | ICD-10-CM | POA: Diagnosis not present

## 2020-04-17 DIAGNOSIS — I951 Orthostatic hypotension: Secondary | ICD-10-CM | POA: Diagnosis not present

## 2020-04-17 DIAGNOSIS — G231 Progressive supranuclear ophthalmoplegia [Steele-Richardson-Olszewski]: Secondary | ICD-10-CM | POA: Diagnosis not present

## 2020-04-17 DIAGNOSIS — E785 Hyperlipidemia, unspecified: Secondary | ICD-10-CM | POA: Diagnosis not present

## 2020-04-17 DIAGNOSIS — I480 Paroxysmal atrial fibrillation: Secondary | ICD-10-CM | POA: Diagnosis not present

## 2020-04-17 DIAGNOSIS — N401 Enlarged prostate with lower urinary tract symptoms: Secondary | ICD-10-CM | POA: Diagnosis not present

## 2020-04-17 DIAGNOSIS — I10 Essential (primary) hypertension: Secondary | ICD-10-CM | POA: Diagnosis not present

## 2020-04-18 ENCOUNTER — Telehealth: Payer: Self-pay | Admitting: Internal Medicine

## 2020-04-18 NOTE — Telephone Encounter (Signed)
LMTCB

## 2020-04-18 NOTE — Telephone Encounter (Signed)
Patient's daughter called in wanted to know if Dr.Scott could call in something for patient's shoudler pain and he would like a copy of his last blood work

## 2020-04-19 DIAGNOSIS — I951 Orthostatic hypotension: Secondary | ICD-10-CM | POA: Diagnosis not present

## 2020-04-19 DIAGNOSIS — N39498 Other specified urinary incontinence: Secondary | ICD-10-CM | POA: Diagnosis not present

## 2020-04-19 DIAGNOSIS — D509 Iron deficiency anemia, unspecified: Secondary | ICD-10-CM | POA: Diagnosis not present

## 2020-04-19 DIAGNOSIS — E785 Hyperlipidemia, unspecified: Secondary | ICD-10-CM | POA: Diagnosis not present

## 2020-04-19 DIAGNOSIS — N401 Enlarged prostate with lower urinary tract symptoms: Secondary | ICD-10-CM | POA: Diagnosis not present

## 2020-04-19 DIAGNOSIS — G231 Progressive supranuclear ophthalmoplegia [Steele-Richardson-Olszewski]: Secondary | ICD-10-CM | POA: Diagnosis not present

## 2020-04-19 DIAGNOSIS — Z951 Presence of aortocoronary bypass graft: Secondary | ICD-10-CM | POA: Diagnosis not present

## 2020-04-19 DIAGNOSIS — I10 Essential (primary) hypertension: Secondary | ICD-10-CM | POA: Diagnosis not present

## 2020-04-19 DIAGNOSIS — I2510 Atherosclerotic heart disease of native coronary artery without angina pectoris: Secondary | ICD-10-CM | POA: Diagnosis not present

## 2020-04-19 DIAGNOSIS — I480 Paroxysmal atrial fibrillation: Secondary | ICD-10-CM | POA: Diagnosis not present

## 2020-04-19 DIAGNOSIS — J410 Simple chronic bronchitis: Secondary | ICD-10-CM | POA: Diagnosis not present

## 2020-04-19 DIAGNOSIS — K625 Hemorrhage of anus and rectum: Secondary | ICD-10-CM | POA: Diagnosis not present

## 2020-04-19 NOTE — Telephone Encounter (Signed)
Called patient and wife. He is going to try using tylenol extra strength.

## 2020-04-22 ENCOUNTER — Telehealth: Payer: Self-pay | Admitting: Internal Medicine

## 2020-04-22 NOTE — Telephone Encounter (Signed)
Please notify - discussed with cardiology - agree ok to restart aspirin.

## 2020-04-24 DIAGNOSIS — I951 Orthostatic hypotension: Secondary | ICD-10-CM | POA: Diagnosis not present

## 2020-04-24 DIAGNOSIS — I10 Essential (primary) hypertension: Secondary | ICD-10-CM | POA: Diagnosis not present

## 2020-04-24 DIAGNOSIS — G231 Progressive supranuclear ophthalmoplegia [Steele-Richardson-Olszewski]: Secondary | ICD-10-CM | POA: Diagnosis not present

## 2020-04-24 DIAGNOSIS — D509 Iron deficiency anemia, unspecified: Secondary | ICD-10-CM | POA: Diagnosis not present

## 2020-04-24 DIAGNOSIS — N39498 Other specified urinary incontinence: Secondary | ICD-10-CM | POA: Diagnosis not present

## 2020-04-24 DIAGNOSIS — I480 Paroxysmal atrial fibrillation: Secondary | ICD-10-CM | POA: Diagnosis not present

## 2020-04-24 DIAGNOSIS — I2510 Atherosclerotic heart disease of native coronary artery without angina pectoris: Secondary | ICD-10-CM | POA: Diagnosis not present

## 2020-04-24 DIAGNOSIS — K625 Hemorrhage of anus and rectum: Secondary | ICD-10-CM | POA: Diagnosis not present

## 2020-04-24 DIAGNOSIS — J410 Simple chronic bronchitis: Secondary | ICD-10-CM | POA: Diagnosis not present

## 2020-04-24 DIAGNOSIS — N401 Enlarged prostate with lower urinary tract symptoms: Secondary | ICD-10-CM | POA: Diagnosis not present

## 2020-04-24 DIAGNOSIS — Z951 Presence of aortocoronary bypass graft: Secondary | ICD-10-CM | POA: Diagnosis not present

## 2020-04-24 DIAGNOSIS — E785 Hyperlipidemia, unspecified: Secondary | ICD-10-CM | POA: Diagnosis not present

## 2020-04-24 NOTE — Telephone Encounter (Signed)
Patient and wife aware.

## 2020-04-29 ENCOUNTER — Other Ambulatory Visit: Payer: Self-pay | Admitting: Internal Medicine

## 2020-05-01 DIAGNOSIS — I10 Essential (primary) hypertension: Secondary | ICD-10-CM | POA: Diagnosis not present

## 2020-05-01 DIAGNOSIS — G231 Progressive supranuclear ophthalmoplegia [Steele-Richardson-Olszewski]: Secondary | ICD-10-CM | POA: Diagnosis not present

## 2020-05-01 DIAGNOSIS — I480 Paroxysmal atrial fibrillation: Secondary | ICD-10-CM | POA: Diagnosis not present

## 2020-05-01 DIAGNOSIS — I951 Orthostatic hypotension: Secondary | ICD-10-CM | POA: Diagnosis not present

## 2020-05-01 DIAGNOSIS — E785 Hyperlipidemia, unspecified: Secondary | ICD-10-CM | POA: Diagnosis not present

## 2020-05-01 DIAGNOSIS — D509 Iron deficiency anemia, unspecified: Secondary | ICD-10-CM | POA: Diagnosis not present

## 2020-05-01 DIAGNOSIS — I2510 Atherosclerotic heart disease of native coronary artery without angina pectoris: Secondary | ICD-10-CM | POA: Diagnosis not present

## 2020-05-01 DIAGNOSIS — J410 Simple chronic bronchitis: Secondary | ICD-10-CM | POA: Diagnosis not present

## 2020-05-01 DIAGNOSIS — Z951 Presence of aortocoronary bypass graft: Secondary | ICD-10-CM | POA: Diagnosis not present

## 2020-05-01 DIAGNOSIS — K625 Hemorrhage of anus and rectum: Secondary | ICD-10-CM | POA: Diagnosis not present

## 2020-05-01 DIAGNOSIS — N39498 Other specified urinary incontinence: Secondary | ICD-10-CM | POA: Diagnosis not present

## 2020-05-01 DIAGNOSIS — N401 Enlarged prostate with lower urinary tract symptoms: Secondary | ICD-10-CM | POA: Diagnosis not present

## 2020-05-03 DIAGNOSIS — Z951 Presence of aortocoronary bypass graft: Secondary | ICD-10-CM | POA: Diagnosis not present

## 2020-05-03 DIAGNOSIS — D509 Iron deficiency anemia, unspecified: Secondary | ICD-10-CM | POA: Diagnosis not present

## 2020-05-03 DIAGNOSIS — E785 Hyperlipidemia, unspecified: Secondary | ICD-10-CM | POA: Diagnosis not present

## 2020-05-03 DIAGNOSIS — I2510 Atherosclerotic heart disease of native coronary artery without angina pectoris: Secondary | ICD-10-CM | POA: Diagnosis not present

## 2020-05-03 DIAGNOSIS — N401 Enlarged prostate with lower urinary tract symptoms: Secondary | ICD-10-CM | POA: Diagnosis not present

## 2020-05-03 DIAGNOSIS — I951 Orthostatic hypotension: Secondary | ICD-10-CM | POA: Diagnosis not present

## 2020-05-03 DIAGNOSIS — I10 Essential (primary) hypertension: Secondary | ICD-10-CM | POA: Diagnosis not present

## 2020-05-03 DIAGNOSIS — G231 Progressive supranuclear ophthalmoplegia [Steele-Richardson-Olszewski]: Secondary | ICD-10-CM | POA: Diagnosis not present

## 2020-05-03 DIAGNOSIS — G4733 Obstructive sleep apnea (adult) (pediatric): Secondary | ICD-10-CM | POA: Diagnosis not present

## 2020-05-03 DIAGNOSIS — K625 Hemorrhage of anus and rectum: Secondary | ICD-10-CM | POA: Diagnosis not present

## 2020-05-03 DIAGNOSIS — I480 Paroxysmal atrial fibrillation: Secondary | ICD-10-CM | POA: Diagnosis not present

## 2020-05-03 DIAGNOSIS — J410 Simple chronic bronchitis: Secondary | ICD-10-CM | POA: Diagnosis not present

## 2020-05-03 DIAGNOSIS — N39498 Other specified urinary incontinence: Secondary | ICD-10-CM | POA: Diagnosis not present

## 2020-05-08 DIAGNOSIS — Z951 Presence of aortocoronary bypass graft: Secondary | ICD-10-CM | POA: Diagnosis not present

## 2020-05-08 DIAGNOSIS — G231 Progressive supranuclear ophthalmoplegia [Steele-Richardson-Olszewski]: Secondary | ICD-10-CM | POA: Diagnosis not present

## 2020-05-08 DIAGNOSIS — E785 Hyperlipidemia, unspecified: Secondary | ICD-10-CM | POA: Diagnosis not present

## 2020-05-08 DIAGNOSIS — I480 Paroxysmal atrial fibrillation: Secondary | ICD-10-CM | POA: Diagnosis not present

## 2020-05-08 DIAGNOSIS — I2510 Atherosclerotic heart disease of native coronary artery without angina pectoris: Secondary | ICD-10-CM | POA: Diagnosis not present

## 2020-05-08 DIAGNOSIS — I951 Orthostatic hypotension: Secondary | ICD-10-CM | POA: Diagnosis not present

## 2020-05-08 DIAGNOSIS — J410 Simple chronic bronchitis: Secondary | ICD-10-CM | POA: Diagnosis not present

## 2020-05-08 DIAGNOSIS — I10 Essential (primary) hypertension: Secondary | ICD-10-CM | POA: Diagnosis not present

## 2020-05-08 DIAGNOSIS — K625 Hemorrhage of anus and rectum: Secondary | ICD-10-CM | POA: Diagnosis not present

## 2020-05-08 DIAGNOSIS — D509 Iron deficiency anemia, unspecified: Secondary | ICD-10-CM | POA: Diagnosis not present

## 2020-05-09 ENCOUNTER — Other Ambulatory Visit: Payer: Self-pay | Admitting: Internal Medicine

## 2020-05-14 DIAGNOSIS — I951 Orthostatic hypotension: Secondary | ICD-10-CM | POA: Diagnosis not present

## 2020-05-14 DIAGNOSIS — Z951 Presence of aortocoronary bypass graft: Secondary | ICD-10-CM | POA: Diagnosis not present

## 2020-05-14 DIAGNOSIS — J410 Simple chronic bronchitis: Secondary | ICD-10-CM | POA: Diagnosis not present

## 2020-05-14 DIAGNOSIS — I480 Paroxysmal atrial fibrillation: Secondary | ICD-10-CM | POA: Diagnosis not present

## 2020-05-14 DIAGNOSIS — I2510 Atherosclerotic heart disease of native coronary artery without angina pectoris: Secondary | ICD-10-CM | POA: Diagnosis not present

## 2020-05-14 DIAGNOSIS — K625 Hemorrhage of anus and rectum: Secondary | ICD-10-CM | POA: Diagnosis not present

## 2020-05-14 DIAGNOSIS — I10 Essential (primary) hypertension: Secondary | ICD-10-CM | POA: Diagnosis not present

## 2020-05-14 DIAGNOSIS — E785 Hyperlipidemia, unspecified: Secondary | ICD-10-CM | POA: Diagnosis not present

## 2020-05-14 DIAGNOSIS — D509 Iron deficiency anemia, unspecified: Secondary | ICD-10-CM | POA: Diagnosis not present

## 2020-05-14 DIAGNOSIS — G231 Progressive supranuclear ophthalmoplegia [Steele-Richardson-Olszewski]: Secondary | ICD-10-CM | POA: Diagnosis not present

## 2020-05-24 DIAGNOSIS — K625 Hemorrhage of anus and rectum: Secondary | ICD-10-CM | POA: Diagnosis not present

## 2020-05-24 DIAGNOSIS — I10 Essential (primary) hypertension: Secondary | ICD-10-CM | POA: Diagnosis not present

## 2020-05-24 DIAGNOSIS — J410 Simple chronic bronchitis: Secondary | ICD-10-CM | POA: Diagnosis not present

## 2020-05-24 DIAGNOSIS — Z951 Presence of aortocoronary bypass graft: Secondary | ICD-10-CM | POA: Diagnosis not present

## 2020-05-24 DIAGNOSIS — I480 Paroxysmal atrial fibrillation: Secondary | ICD-10-CM | POA: Diagnosis not present

## 2020-05-24 DIAGNOSIS — G231 Progressive supranuclear ophthalmoplegia [Steele-Richardson-Olszewski]: Secondary | ICD-10-CM | POA: Diagnosis not present

## 2020-05-24 DIAGNOSIS — I951 Orthostatic hypotension: Secondary | ICD-10-CM | POA: Diagnosis not present

## 2020-05-24 DIAGNOSIS — I2510 Atherosclerotic heart disease of native coronary artery without angina pectoris: Secondary | ICD-10-CM | POA: Diagnosis not present

## 2020-05-24 DIAGNOSIS — E785 Hyperlipidemia, unspecified: Secondary | ICD-10-CM | POA: Diagnosis not present

## 2020-05-24 DIAGNOSIS — D509 Iron deficiency anemia, unspecified: Secondary | ICD-10-CM | POA: Diagnosis not present

## 2020-05-31 DIAGNOSIS — I480 Paroxysmal atrial fibrillation: Secondary | ICD-10-CM | POA: Diagnosis not present

## 2020-05-31 DIAGNOSIS — I10 Essential (primary) hypertension: Secondary | ICD-10-CM | POA: Diagnosis not present

## 2020-05-31 DIAGNOSIS — Z951 Presence of aortocoronary bypass graft: Secondary | ICD-10-CM | POA: Diagnosis not present

## 2020-05-31 DIAGNOSIS — I2510 Atherosclerotic heart disease of native coronary artery without angina pectoris: Secondary | ICD-10-CM | POA: Diagnosis not present

## 2020-05-31 DIAGNOSIS — J410 Simple chronic bronchitis: Secondary | ICD-10-CM | POA: Diagnosis not present

## 2020-05-31 DIAGNOSIS — D509 Iron deficiency anemia, unspecified: Secondary | ICD-10-CM | POA: Diagnosis not present

## 2020-05-31 DIAGNOSIS — G231 Progressive supranuclear ophthalmoplegia [Steele-Richardson-Olszewski]: Secondary | ICD-10-CM | POA: Diagnosis not present

## 2020-05-31 DIAGNOSIS — K625 Hemorrhage of anus and rectum: Secondary | ICD-10-CM | POA: Diagnosis not present

## 2020-05-31 DIAGNOSIS — E785 Hyperlipidemia, unspecified: Secondary | ICD-10-CM | POA: Diagnosis not present

## 2020-05-31 DIAGNOSIS — I951 Orthostatic hypotension: Secondary | ICD-10-CM | POA: Diagnosis not present

## 2020-06-02 DIAGNOSIS — G4733 Obstructive sleep apnea (adult) (pediatric): Secondary | ICD-10-CM | POA: Diagnosis not present

## 2020-06-04 ENCOUNTER — Ambulatory Visit: Payer: Medicare HMO | Admitting: Internal Medicine

## 2020-06-05 DIAGNOSIS — Z87891 Personal history of nicotine dependence: Secondary | ICD-10-CM

## 2020-06-05 DIAGNOSIS — M1990 Unspecified osteoarthritis, unspecified site: Secondary | ICD-10-CM

## 2020-06-05 DIAGNOSIS — G231 Progressive supranuclear ophthalmoplegia [Steele-Richardson-Olszewski]: Secondary | ICD-10-CM | POA: Diagnosis not present

## 2020-06-05 DIAGNOSIS — Z9181 History of falling: Secondary | ICD-10-CM

## 2020-06-05 DIAGNOSIS — I951 Orthostatic hypotension: Secondary | ICD-10-CM | POA: Diagnosis not present

## 2020-06-05 DIAGNOSIS — N401 Enlarged prostate with lower urinary tract symptoms: Secondary | ICD-10-CM

## 2020-06-05 DIAGNOSIS — J410 Simple chronic bronchitis: Secondary | ICD-10-CM | POA: Diagnosis not present

## 2020-06-05 DIAGNOSIS — Z951 Presence of aortocoronary bypass graft: Secondary | ICD-10-CM | POA: Diagnosis not present

## 2020-06-05 DIAGNOSIS — H409 Unspecified glaucoma: Secondary | ICD-10-CM

## 2020-06-05 DIAGNOSIS — E785 Hyperlipidemia, unspecified: Secondary | ICD-10-CM | POA: Diagnosis not present

## 2020-06-05 DIAGNOSIS — N39498 Other specified urinary incontinence: Secondary | ICD-10-CM

## 2020-06-05 DIAGNOSIS — H9193 Unspecified hearing loss, bilateral: Secondary | ICD-10-CM

## 2020-06-05 DIAGNOSIS — K625 Hemorrhage of anus and rectum: Secondary | ICD-10-CM | POA: Diagnosis not present

## 2020-06-05 DIAGNOSIS — K5791 Diverticulosis of intestine, part unspecified, without perforation or abscess with bleeding: Secondary | ICD-10-CM

## 2020-06-05 DIAGNOSIS — R002 Palpitations: Secondary | ICD-10-CM | POA: Diagnosis not present

## 2020-06-05 DIAGNOSIS — I2510 Atherosclerotic heart disease of native coronary artery without angina pectoris: Secondary | ICD-10-CM | POA: Diagnosis not present

## 2020-06-05 DIAGNOSIS — E7800 Pure hypercholesterolemia, unspecified: Secondary | ICD-10-CM

## 2020-06-05 DIAGNOSIS — I10 Essential (primary) hypertension: Secondary | ICD-10-CM | POA: Diagnosis not present

## 2020-06-05 DIAGNOSIS — I480 Paroxysmal atrial fibrillation: Secondary | ICD-10-CM | POA: Diagnosis not present

## 2020-06-05 DIAGNOSIS — F32A Depression, unspecified: Secondary | ICD-10-CM

## 2020-06-05 DIAGNOSIS — I429 Cardiomyopathy, unspecified: Secondary | ICD-10-CM | POA: Diagnosis not present

## 2020-06-05 DIAGNOSIS — R030 Elevated blood-pressure reading, without diagnosis of hypertension: Secondary | ICD-10-CM | POA: Diagnosis not present

## 2020-06-05 DIAGNOSIS — K219 Gastro-esophageal reflux disease without esophagitis: Secondary | ICD-10-CM

## 2020-06-05 DIAGNOSIS — D509 Iron deficiency anemia, unspecified: Secondary | ICD-10-CM | POA: Diagnosis not present

## 2020-06-12 ENCOUNTER — Other Ambulatory Visit: Payer: Self-pay

## 2020-06-12 ENCOUNTER — Ambulatory Visit (INDEPENDENT_AMBULATORY_CARE_PROVIDER_SITE_OTHER): Payer: Medicare HMO | Admitting: Internal Medicine

## 2020-06-12 ENCOUNTER — Ambulatory Visit (INDEPENDENT_AMBULATORY_CARE_PROVIDER_SITE_OTHER): Payer: Medicare HMO

## 2020-06-12 VITALS — BP 120/66 | HR 66 | Temp 98.6°F | Resp 16 | Ht 65.0 in | Wt 133.6 lb

## 2020-06-12 DIAGNOSIS — J411 Mucopurulent chronic bronchitis: Secondary | ICD-10-CM | POA: Diagnosis not present

## 2020-06-12 DIAGNOSIS — I482 Chronic atrial fibrillation, unspecified: Secondary | ICD-10-CM

## 2020-06-12 DIAGNOSIS — I10 Essential (primary) hypertension: Secondary | ICD-10-CM

## 2020-06-12 DIAGNOSIS — I700 Atherosclerosis of aorta: Secondary | ICD-10-CM

## 2020-06-12 DIAGNOSIS — E7800 Pure hypercholesterolemia, unspecified: Secondary | ICD-10-CM

## 2020-06-12 DIAGNOSIS — R0602 Shortness of breath: Secondary | ICD-10-CM | POA: Diagnosis not present

## 2020-06-12 DIAGNOSIS — G231 Progressive supranuclear ophthalmoplegia [Steele-Richardson-Olszewski]: Secondary | ICD-10-CM

## 2020-06-12 DIAGNOSIS — I429 Cardiomyopathy, unspecified: Secondary | ICD-10-CM

## 2020-06-12 DIAGNOSIS — D508 Other iron deficiency anemias: Secondary | ICD-10-CM

## 2020-06-12 DIAGNOSIS — R739 Hyperglycemia, unspecified: Secondary | ICD-10-CM

## 2020-06-12 DIAGNOSIS — I517 Cardiomegaly: Secondary | ICD-10-CM | POA: Diagnosis not present

## 2020-06-12 NOTE — Progress Notes (Signed)
[name]: Tyler Stout, male   DOB: 1925-02-23, 87 y.o.   MRN: [id number]   Subjective:    [name]: Tyler Stout, male    DOB: [birth year], 87 y.o.   MRN: [id number]  HPI This visit occurred during the SARS-CoV-2 public health emergency.  Safety protocols were in place, including screening questions prior to the visit, additional usage of staff PPE, and extensive cleaning of exam room while observing appropriate contact time as indicated for disinfecting solutions.  Patient here for a scheduled follow up.  He is accompanied by his daughter.  History obtained from both of them. Recently saw cardiology.  ECHO - EF 45-50% with mild AI, MR and TR with mild to moderate pulmonary hypertension.  Per daughter, reported sob and discussed with cardiology.  metoprol decreased.  Still reports some sob.  No increased cough/congestion.  No acid reflux reported. No abdominal pain.  Bowels moving. Blood pressure doing well.   Past Medical History:  Diagnosis Date  . Anemia    iron deficiency  . Arthritis   . Atrial fibrillation ([place])   . Bilateral renal cysts    worked up by Dr Bernardo Heater  . BPH (benign prostatic hypertrophy)   . CAD (coronary artery disease)    s/p bypass graft x 4 (1991)  . Diverticulosis   . GERD (gastroesophageal reflux disease)   . GI bleed   . Glaucoma   . Hypercholesterolemia   . Hypertension   . Pancreatitis    x2 (1994 and 1998), presumed to be gallstone pancreatitis.  s/p ERCP and sphincterotomy   Past Surgical History:  Procedure Laterality Date  . APPENDECTOMY    . CHOLECYSTECTOMY  1994  . COLONOSCOPY N/A 07/14/2019   Procedure: COLONOSCOPY;  Surgeon: Virgel Manifold, MD;  Location: [facility] ENDOSCOPY;  Service: Endoscopy;  Laterality: N/A;  . CORONARY ARTERY BYPASS GRAFT     x 4,  1991  . EMBOLIZATION Right 05/25/2019   Procedure: EMBOLIZATION (Right Colon);  Surgeon: Katha Cabal, MD;  Location: [facility];  Service: Cardiovascular;   Laterality: Right;  . INGUINAL HERNIA REPAIR  2009  . open heart surgery  1991   Family History  Problem Relation Age of Onset  . Throat cancer Father        oropharyngeal cancer  . Kidney disease Sister   . Heart disease Other   . Colon cancer Neg Hx   . Prostate cancer Neg Hx    Social History   Socioeconomic History  . Marital status: Married    Spouse name: Not on file  . Number of children: Not on file  . Years of education: Not on file  . Highest education level: Not on file  Occupational History  . Not on file  Tobacco Use  . Smoking status: Former Smoker    Packs/day: 2.00    Years: 25.00    Pack years: 50.00    Types: Cigarettes    Quit date: 01/14/1964    Years since quitting: 56.4  . Smokeless tobacco: Never Used  . Tobacco comment: smoked 2 packs/day, 1 pipe and 1 cigar once in a while.  Substance and Sexual Activity  . Alcohol use: No    Alcohol/week: 0.0 standard drinks  . Drug use: No  . Sexual activity: Not on file  Other Topics Concern  . Not on file  Social History Narrative  . Not on file   Social Determinants of Health   Financial Resource Strain: Low  Risk   . Difficulty of Paying Living Expenses: Not hard at all  Food Insecurity: No Food Insecurity  . Worried About Charity fundraiser in the Last Year: Never true  . Ran Out of Food in the Last Year: Never true  Transportation Needs: No Transportation Needs  . Lack of Transportation (Medical): No  . Lack of Transportation (Non-Medical): No  Physical Activity: Insufficiently Active  . Days of Exercise per Week: 1 day  . Minutes of Exercise per Session: 60 min  Stress: No Stress Concern Present  . Feeling of Stress : Not at all  Social Connections: Unknown  . Frequency of Communication with Friends and Family: Not on file  . Frequency of Social Gatherings with Friends and Family: More than three times a week  . Attends Religious Services: Not on file  . Active Member of Clubs or  Organizations: Not on file  . Attends Archivist Meetings: Not on file  . Marital Status: Married    Outpatient Encounter Medications as of 06/12/2020  Medication Sig  . metoprolol tartrate (LOPRESSOR) 25 MG tablet Take 0.5 mg by mouth 2 (two) times daily.  Marland Kitchen albuterol (VENTOLIN HFA) 108 (90 Base) MCG/ACT inhaler Inhale 2 puffs into the lungs every 6 (six) hours as needed for wheezing or shortness of breath.  Marland Kitchen ascorbic acid (VITAMIN C) 250 MG CHEW Chew 250 mg by mouth daily.  Marland Kitchen atorvastatin (LIPITOR) 20 MG tablet TAKE 1 TABLET (20 MG TOTAL) BY MOUTH DAILY.  Marland Kitchen azelastine (ASTELIN) 0.1 % nasal spray PLACE 1 SPRAY INTO BOTH NOSTRILS 2 (TWO) TIMES DAILY (Patient taking differently: Place 1 spray into both nostrils 2 (two) times daily as needed for rhinitis or allergies.)  . BREO ELLIPTA 200-25 MCG/INH AEPB Inhale 1 puff into the lungs daily.  . carbidopa-levodopa (SINEMET CR) 50-200 MG tablet Take 1 tablet by mouth at bedtime.  . carbidopa-levodopa (SINEMET IR) 25-100 MG tablet Take 2 tablets by mouth 3 (three) times daily.  . Cholecalciferol (VITAMIN D) 50 MCG (2000 UT) tablet Take 2,000 Units by mouth every evening.   . citalopram (CELEXA) 20 MG tablet TAKE 1 TABLET (20 MG TOTAL) BY MOUTH DAILY.  . fluticasone (FLONASE) 50 MCG/ACT nasal spray SPRAY 2 SPRAYS INTO EACH NOSTRIL EVERY DAY (Patient taking differently: Place 2 sprays into both nostrils daily as needed for allergies or rhinitis.)  . furosemide (LASIX) 20 MG tablet Take 20 mg by mouth daily as needed.  Marland Kitchen guaiFENesin (MUCINEX) 600 MG 12 hr tablet Take 600 mg by mouth daily.  Marland Kitchen latanoprost (XALATAN) 0.005 % ophthalmic solution Place 1 drop into both eyes at bedtime.   . Multiple Vitamin (MULTIVITAMIN WITH MINERALS) TABS tablet Take 1 tablet by mouth every evening.   . pantoprazole (PROTONIX) 40 MG tablet TAKE 1 TABLET BY MOUTH EVERY DAY  . potassium chloride (KLOR-CON) 10 MEQ tablet Take one tablet by mouth with lasix. (Patient  taking differently: Take 10 mEq by mouth daily as needed.)  . rivastigmine (EXELON) 4.6 mg/24hr Place 1 patch onto the skin daily.  . tamsulosin (FLOMAX) 0.4 MG CAPS capsule TAKE 1 CAPSULE BY MOUTH EVERY DAY  . telmisartan (MICARDIS) 20 MG tablet Take 20 mg by mouth daily.  . [DISCONTINUED] metoprolol tartrate (LOPRESSOR) 25 MG tablet Take 25 mg by mouth 2 (two) times daily.    No facility-administered encounter medications on file as of 06/12/2020.    Review of Systems  Constitutional: Negative for appetite change and unexpected weight change.  HENT: Negative for congestion and sinus pressure.   Respiratory: Negative for cough and chest tightness.        Some sob as outlined.   Cardiovascular: Negative for chest pain, palpitations and leg swelling.  Gastrointestinal: Negative for abdominal pain, diarrhea, nausea and vomiting.  Genitourinary: Negative for difficulty urinating and dysuria.  Musculoskeletal: Negative for joint swelling and myalgias.  Skin: Negative for color change and rash.  Neurological: Negative for dizziness, light-headedness and headaches.  Psychiatric/Behavioral: Negative for agitation and dysphoric mood.       Objective:    Physical Exam Vitals reviewed.  Constitutional:      General: He is not in acute distress.    Appearance: Normal appearance. He is well-developed.  HENT:     Head: Normocephalic and atraumatic.     Right Ear: External ear normal.     Left Ear: External ear normal.  Eyes:     General: No scleral icterus.       Right eye: No discharge.        Left eye: No discharge.     Conjunctiva/sclera: Conjunctivae normal.  Cardiovascular:     Rate and Rhythm: Normal rate.     Comments: Rate controlled.  Pulmonary:     Effort: Pulmonary effort is normal. No respiratory distress.     Breath sounds: Normal breath sounds.  Abdominal:     General: Bowel sounds are normal.     Palpations: Abdomen is soft.     Tenderness: There is no abdominal  tenderness.  Musculoskeletal:        General: No swelling or tenderness.     Cervical back: Neck supple. No tenderness.  Lymphadenopathy:     Cervical: No cervical adenopathy.  Skin:    Findings: No erythema or rash.  Neurological:     Mental Status: He is alert.  Psychiatric:        Mood and Affect: Mood normal.        Behavior: Behavior normal.     BP 120/66   Pulse 66   Temp 98.6 F (37 C) (Oral)   Resp 16   Ht '5\' 5"'  (1.651 m)   Wt 133 lb 9.6 oz (60.6 kg)   SpO2 99%   BMI 22.23 kg/m  Wt Readings from Last 3 Encounters:  06/12/20 133 lb 9.6 oz (60.6 kg)  04/10/20 132 lb 6.4 oz (60.1 kg)  04/04/20 124 lb (56.2 kg)     Lab Results  Component Value Date   WBC 5.5 06/12/2020   HGB 11.1 (L) 06/12/2020   HCT 32.5 (L) 06/12/2020   PLT 208.0 06/12/2020   GLUCOSE 114 (H) 06/12/2020   CHOL 107 04/10/2020   TRIG 74.0 04/10/2020   HDL 49.50 04/10/2020   LDLCALC 43 04/10/2020   ALT 5 04/10/2020   AST 20 04/10/2020   NA 134 (L) 06/12/2020   K 4.2 06/12/2020   CL 100 06/12/2020   CREATININE 0.82 06/12/2020   BUN 20 06/12/2020   CO2 26 06/12/2020   TSH 1.94 04/10/2020   INR 1.1 04/04/2020   HGBA1C 5.7 04/10/2020       Assessment & Plan:   Problem List Items Addressed This Visit    Anemia - Primary    Recheck cbc today.       Relevant Orders   CBC with Differential/Platelet (Completed)   Cardiomyopathy ([place])    No evidence of volume overload.  Takes lasix prn.  Follow.       Relevant Medications  metoprolol tartrate (LOPRESSOR) 25 MG tablet   Chronic atrial fibrillation ([facility])    Off coumadin secondary to GI bleeds.  On metoprolol.  Was off aspirin secondary to recent GI bleed.  Back on aspirin now.  Follow.       Relevant Medications   metoprolol tartrate (LOPRESSOR) 25 MG tablet   Chronic bronchitis ([facility])    Has noticed some increased sob.  Will check cxr.  Further w/up pending results.        Hypercholesteremia    Continue lipitor.  Check lipid  panel and liver function tests with todays labs.        Relevant Medications   metoprolol tartrate (LOPRESSOR) 25 MG tablet   Hyperglycemia    Follow met b and a1c.       Hypertension    Continue metoprolol and micardis.  Blood pressure stable.  Follow pressures.  Follow metabolic panel.       Relevant Medications   metoprolol tartrate (LOPRESSOR) 25 MG tablet   Other Relevant Orders   Basic metabolic panel (Completed)   Progressive supranuclear palsy ([place])    Followed by neurology.  Stable.       SOB (shortness of breath)    Dr Ubaldo Glassing just evaluated and adjusted metoprolol.  Check cxr.       Relevant Orders   DG Chest 2 View (Completed)   Thoracic aortic atherosclerosis ([facility])    Continue lipitor.        Relevant Medications   metoprolol tartrate (LOPRESSOR) 25 MG tablet       [name], MD

## 2020-06-13 LAB — CBC WITH DIFFERENTIAL/PLATELET
Basophils Absolute: 0.1 10*3/uL (ref 0.0–0.1)
Basophils Relative: 1 % (ref 0.0–3.0)
Eosinophils Absolute: 0 10*3/uL (ref 0.0–0.7)
Eosinophils Relative: 0.9 % (ref 0.0–5.0)
HCT: 32.5 % — ABNORMAL LOW (ref 39.0–52.0)
Hemoglobin: 11.1 g/dL — ABNORMAL LOW (ref 13.0–17.0)
Lymphocytes Relative: 12.3 % (ref 12.0–46.0)
Lymphs Abs: 0.7 10*3/uL (ref 0.7–4.0)
MCHC: 34.1 g/dL (ref 30.0–36.0)
MCV: 94.4 fl (ref 78.0–100.0)
Monocytes Absolute: 0.6 10*3/uL (ref 0.1–1.0)
Monocytes Relative: 11.3 % (ref 3.0–12.0)
Neutro Abs: 4.1 10*3/uL (ref 1.4–7.7)
Neutrophils Relative %: 74.5 % (ref 43.0–77.0)
Platelets: 208 10*3/uL (ref 150.0–400.0)
RBC: 3.44 Mil/uL — ABNORMAL LOW (ref 4.22–5.81)
RDW: 15.9 % — ABNORMAL HIGH (ref 11.5–15.5)
WBC: 5.5 10*3/uL (ref 4.0–10.5)

## 2020-06-13 LAB — BASIC METABOLIC PANEL
BUN: 20 mg/dL (ref 6–23)
CO2: 26 mEq/L (ref 19–32)
Calcium: 8.8 mg/dL (ref 8.4–10.5)
Chloride: 100 mEq/L (ref 96–112)
Creatinine, Ser: 0.82 mg/dL (ref 0.40–1.50)
GFR: 76.02 mL/min (ref >60.00)
Glucose, Bld: 114 mg/dL — ABNORMAL HIGH (ref 70–99)
Potassium: 4.2 mEq/L (ref 3.5–5.1)
Sodium: 134 mEq/L — ABNORMAL LOW (ref 135–145)

## 2020-06-14 ENCOUNTER — Other Ambulatory Visit: Payer: Self-pay | Admitting: Internal Medicine

## 2020-06-14 DIAGNOSIS — R0602 Shortness of breath: Secondary | ICD-10-CM

## 2020-06-14 DIAGNOSIS — R9389 Abnormal findings on diagnostic imaging of other specified body structures: Secondary | ICD-10-CM

## 2020-06-14 NOTE — Progress Notes (Signed)
Order placed for pulmonary referral.  

## 2020-06-17 ENCOUNTER — Encounter: Payer: Self-pay | Admitting: Internal Medicine

## 2020-06-17 NOTE — Assessment & Plan Note (Signed)
Continue lipitor  ?

## 2020-06-17 NOTE — Assessment & Plan Note (Signed)
Continue lipitor.  Check lipid panel and liver function tests with todays labs.

## 2020-06-17 NOTE — Assessment & Plan Note (Signed)
Followed by neurology.  Stable  

## 2020-06-17 NOTE — Assessment & Plan Note (Signed)
Has noticed some increased sob.  Will check cxr.  Further w/up pending results.

## 2020-06-17 NOTE — Assessment & Plan Note (Signed)
Continue metoprolol and micardis.  Blood pressure stable.  Follow pressures.  Follow metabolic panel.

## 2020-06-17 NOTE — Assessment & Plan Note (Signed)
Off coumadin secondary to GI bleeds.  On metoprolol.  Was off aspirin secondary to recent GI bleed.  Back on aspirin now.  Follow.

## 2020-06-17 NOTE — Assessment & Plan Note (Signed)
Follow met b and a1c.  

## 2020-06-17 NOTE — Assessment & Plan Note (Signed)
Recheck cbc today.   

## 2020-06-17 NOTE — Assessment & Plan Note (Signed)
No evidence of volume overload.  Takes lasix prn.  Follow.

## 2020-06-17 NOTE — Assessment & Plan Note (Signed)
Dr Ubaldo Glassing just evaluated and adjusted metoprolol.  Check cxr.

## 2020-06-23 DIAGNOSIS — G20 Parkinson's disease: Secondary | ICD-10-CM | POA: Diagnosis not present

## 2020-06-23 DIAGNOSIS — Z20822 Contact with and (suspected) exposure to covid-19: Secondary | ICD-10-CM | POA: Diagnosis not present

## 2020-06-23 DIAGNOSIS — K921 Melena: Secondary | ICD-10-CM | POA: Diagnosis not present

## 2020-06-23 DIAGNOSIS — R1032 Left lower quadrant pain: Secondary | ICD-10-CM | POA: Diagnosis not present

## 2020-06-23 DIAGNOSIS — K5730 Diverticulosis of large intestine without perforation or abscess without bleeding: Secondary | ICD-10-CM | POA: Diagnosis not present

## 2020-06-23 DIAGNOSIS — I2510 Atherosclerotic heart disease of native coronary artery without angina pectoris: Secondary | ICD-10-CM | POA: Diagnosis not present

## 2020-06-24 DIAGNOSIS — I4510 Unspecified right bundle-branch block: Secondary | ICD-10-CM | POA: Diagnosis not present

## 2020-06-24 DIAGNOSIS — R000 Tachycardia, unspecified: Secondary | ICD-10-CM | POA: Diagnosis not present

## 2020-06-24 DIAGNOSIS — I2510 Atherosclerotic heart disease of native coronary artery without angina pectoris: Secondary | ICD-10-CM | POA: Diagnosis not present

## 2020-06-24 DIAGNOSIS — Z951 Presence of aortocoronary bypass graft: Secondary | ICD-10-CM | POA: Diagnosis not present

## 2020-06-24 DIAGNOSIS — I482 Chronic atrial fibrillation, unspecified: Secondary | ICD-10-CM | POA: Diagnosis not present

## 2020-06-24 DIAGNOSIS — Q2733 Arteriovenous malformation of digestive system vessel: Secondary | ICD-10-CM | POA: Diagnosis not present

## 2020-06-24 DIAGNOSIS — K5791 Diverticulosis of intestine, part unspecified, without perforation or abscess with bleeding: Secondary | ICD-10-CM | POA: Diagnosis not present

## 2020-06-25 DIAGNOSIS — G20 Parkinson's disease: Secondary | ICD-10-CM | POA: Diagnosis not present

## 2020-06-25 DIAGNOSIS — I2510 Atherosclerotic heart disease of native coronary artery without angina pectoris: Secondary | ICD-10-CM | POA: Diagnosis not present

## 2020-06-25 DIAGNOSIS — I10 Essential (primary) hypertension: Secondary | ICD-10-CM | POA: Diagnosis not present

## 2020-06-25 DIAGNOSIS — K5791 Diverticulosis of intestine, part unspecified, without perforation or abscess with bleeding: Secondary | ICD-10-CM | POA: Diagnosis not present

## 2020-06-25 DIAGNOSIS — E785 Hyperlipidemia, unspecified: Secondary | ICD-10-CM | POA: Diagnosis not present

## 2020-06-25 DIAGNOSIS — K5730 Diverticulosis of large intestine without perforation or abscess without bleeding: Secondary | ICD-10-CM | POA: Diagnosis not present

## 2020-06-25 DIAGNOSIS — R791 Abnormal coagulation profile: Secondary | ICD-10-CM | POA: Diagnosis not present

## 2020-06-25 DIAGNOSIS — R710 Precipitous drop in hematocrit: Secondary | ICD-10-CM | POA: Diagnosis not present

## 2020-06-25 DIAGNOSIS — Z66 Do not resuscitate: Secondary | ICD-10-CM | POA: Diagnosis not present

## 2020-06-25 DIAGNOSIS — D62 Acute posthemorrhagic anemia: Secondary | ICD-10-CM | POA: Diagnosis not present

## 2020-06-25 DIAGNOSIS — Z8719 Personal history of other diseases of the digestive system: Secondary | ICD-10-CM | POA: Diagnosis not present

## 2020-06-25 DIAGNOSIS — J449 Chronic obstructive pulmonary disease, unspecified: Secondary | ICD-10-CM | POA: Diagnosis not present

## 2020-06-25 DIAGNOSIS — I25810 Atherosclerosis of coronary artery bypass graft(s) without angina pectoris: Secondary | ICD-10-CM | POA: Diagnosis not present

## 2020-06-25 DIAGNOSIS — I482 Chronic atrial fibrillation, unspecified: Secondary | ICD-10-CM | POA: Diagnosis not present

## 2020-06-25 DIAGNOSIS — I959 Hypotension, unspecified: Secondary | ICD-10-CM | POA: Diagnosis not present

## 2020-06-25 DIAGNOSIS — K5731 Diverticulosis of large intestine without perforation or abscess with bleeding: Secondary | ICD-10-CM | POA: Diagnosis not present

## 2020-06-25 DIAGNOSIS — R194 Change in bowel habit: Secondary | ICD-10-CM | POA: Diagnosis not present

## 2020-06-26 DIAGNOSIS — I2510 Atherosclerotic heart disease of native coronary artery without angina pectoris: Secondary | ICD-10-CM | POA: Diagnosis not present

## 2020-06-26 DIAGNOSIS — I10 Essential (primary) hypertension: Secondary | ICD-10-CM | POA: Diagnosis not present

## 2020-06-26 DIAGNOSIS — I482 Chronic atrial fibrillation, unspecified: Secondary | ICD-10-CM | POA: Diagnosis not present

## 2020-06-26 DIAGNOSIS — K5791 Diverticulosis of intestine, part unspecified, without perforation or abscess with bleeding: Secondary | ICD-10-CM | POA: Diagnosis not present

## 2020-06-27 ENCOUNTER — Telehealth: Payer: Self-pay | Admitting: Internal Medicine

## 2020-06-27 DIAGNOSIS — I10 Essential (primary) hypertension: Secondary | ICD-10-CM | POA: Diagnosis not present

## 2020-06-27 DIAGNOSIS — K31819 Angiodysplasia of stomach and duodenum without bleeding: Secondary | ICD-10-CM | POA: Diagnosis not present

## 2020-06-27 DIAGNOSIS — D62 Acute posthemorrhagic anemia: Secondary | ICD-10-CM | POA: Diagnosis not present

## 2020-06-27 DIAGNOSIS — I482 Chronic atrial fibrillation, unspecified: Secondary | ICD-10-CM | POA: Diagnosis not present

## 2020-06-27 DIAGNOSIS — K5791 Diverticulosis of intestine, part unspecified, without perforation or abscess with bleeding: Secondary | ICD-10-CM | POA: Diagnosis not present

## 2020-06-27 NOTE — Telephone Encounter (Signed)
Daughter aware of below. They will hold on omega 3. Confirmed he is doing ok. Message for TCM has already been forwarded.

## 2020-06-27 NOTE — Telephone Encounter (Signed)
[facility] called to inform Dr Nicki Reaper that patient is going to be discharged from the hospital today. He did have a bleed from Diverticulitis no procedures to fix, it is a possibility that it may happen again. Pt was instructed no Asprin for now, patient did need two units of blood. Pt was instructed to Follow up with PCP.

## 2020-06-27 NOTE — Telephone Encounter (Signed)
Thanks for the update.  Hold on starting omega 3.  Need to forward to [place] for TCM.

## 2020-06-27 NOTE — Telephone Encounter (Signed)
Tyler Stout called an wanted to let Dr. Nicki Reaper know that her father has been at [facility] since Sunday with a GI bleed and they took him off his daily Asprin and she wanted to know he he could start an Omega 3 She would like a call back

## 2020-06-27 NOTE — Telephone Encounter (Signed)
Please advise patient still currently admitted.

## 2020-06-29 ENCOUNTER — Telehealth: Payer: Self-pay

## 2020-06-29 NOTE — Telephone Encounter (Signed)
Transition Care Management Unsuccessful Follow-up Telephone Call  Date of discharge and from where:  06/27/20 from [facility]  Attempts:  1st Attempt  Reason for unsuccessful TCM follow-up call:  Unable to leave message. No answer. No voice mail. Will follow as appropriate.

## 2020-07-02 DIAGNOSIS — Z951 Presence of aortocoronary bypass graft: Secondary | ICD-10-CM | POA: Diagnosis not present

## 2020-07-02 DIAGNOSIS — K922 Gastrointestinal hemorrhage, unspecified: Secondary | ICD-10-CM | POA: Diagnosis not present

## 2020-07-02 DIAGNOSIS — Z7951 Long term (current) use of inhaled steroids: Secondary | ICD-10-CM | POA: Diagnosis not present

## 2020-07-02 DIAGNOSIS — I25810 Atherosclerosis of coronary artery bypass graft(s) without angina pectoris: Secondary | ICD-10-CM | POA: Diagnosis not present

## 2020-07-02 DIAGNOSIS — Q2733 Arteriovenous malformation of digestive system vessel: Secondary | ICD-10-CM | POA: Diagnosis not present

## 2020-07-02 DIAGNOSIS — D62 Acute posthemorrhagic anemia: Secondary | ICD-10-CM | POA: Diagnosis not present

## 2020-07-02 DIAGNOSIS — I10 Essential (primary) hypertension: Secondary | ICD-10-CM | POA: Diagnosis not present

## 2020-07-02 DIAGNOSIS — H409 Unspecified glaucoma: Secondary | ICD-10-CM | POA: Diagnosis not present

## 2020-07-02 DIAGNOSIS — I482 Chronic atrial fibrillation, unspecified: Secondary | ICD-10-CM | POA: Diagnosis not present

## 2020-07-02 DIAGNOSIS — G20 Parkinson's disease: Secondary | ICD-10-CM | POA: Diagnosis not present

## 2020-07-02 DIAGNOSIS — K5790 Diverticulosis of intestine, part unspecified, without perforation or abscess without bleeding: Secondary | ICD-10-CM | POA: Diagnosis not present

## 2020-07-02 DIAGNOSIS — E785 Hyperlipidemia, unspecified: Secondary | ICD-10-CM | POA: Diagnosis not present

## 2020-07-02 NOTE — Telephone Encounter (Signed)
Transition Care Management Unsuccessful Follow-up Telephone Call  Date of discharge and from where:  06/27/20 from [facility]  Attempts:  2nd Attempt  Reason for unsuccessful TCM follow-up call:  Unable to reach patient. No answer on primary number. Call forwards to ask for remote access code. Unable to leave message. No answer with secondary number. Will follow with third call tomorrow.

## 2020-07-03 ENCOUNTER — Telehealth: Payer: Self-pay | Admitting: Internal Medicine

## 2020-07-03 DIAGNOSIS — H409 Unspecified glaucoma: Secondary | ICD-10-CM | POA: Diagnosis not present

## 2020-07-03 DIAGNOSIS — G20 Parkinson's disease: Secondary | ICD-10-CM | POA: Diagnosis not present

## 2020-07-03 DIAGNOSIS — G4733 Obstructive sleep apnea (adult) (pediatric): Secondary | ICD-10-CM | POA: Diagnosis not present

## 2020-07-03 DIAGNOSIS — Q2733 Arteriovenous malformation of digestive system vessel: Secondary | ICD-10-CM | POA: Diagnosis not present

## 2020-07-03 DIAGNOSIS — I25810 Atherosclerosis of coronary artery bypass graft(s) without angina pectoris: Secondary | ICD-10-CM | POA: Diagnosis not present

## 2020-07-03 DIAGNOSIS — I482 Chronic atrial fibrillation, unspecified: Secondary | ICD-10-CM | POA: Diagnosis not present

## 2020-07-03 DIAGNOSIS — K922 Gastrointestinal hemorrhage, unspecified: Secondary | ICD-10-CM | POA: Diagnosis not present

## 2020-07-03 DIAGNOSIS — D62 Acute posthemorrhagic anemia: Secondary | ICD-10-CM | POA: Diagnosis not present

## 2020-07-03 DIAGNOSIS — K5790 Diverticulosis of intestine, part unspecified, without perforation or abscess without bleeding: Secondary | ICD-10-CM | POA: Diagnosis not present

## 2020-07-03 DIAGNOSIS — E785 Hyperlipidemia, unspecified: Secondary | ICD-10-CM | POA: Diagnosis not present

## 2020-07-03 DIAGNOSIS — I10 Essential (primary) hypertension: Secondary | ICD-10-CM | POA: Diagnosis not present

## 2020-07-03 NOTE — Telephone Encounter (Signed)
Please confirm he is doing ok.  Ok to work in.  I can get with you if you do not have a spot

## 2020-07-03 NOTE — Telephone Encounter (Signed)
OT called and wanted Dr Nicki Reaper to know that OT will not see patient, he is base line until further notice. Patient will continue with PT. Fax will be sent to confirm this message.

## 2020-07-03 NOTE — Telephone Encounter (Signed)
First openings you have is 7/18 for hospital f/u nothing else available. Would you like to advise when you want to see patient?

## 2020-07-04 NOTE — Telephone Encounter (Signed)
Pt doing ok and scheduled for HFU on 6/29 at 12:00. Forwarding to [place] for TCM

## 2020-07-04 NOTE — Telephone Encounter (Signed)
Transition Care Management Unsuccessful Follow-up Telephone Call  Date of discharge and from where:  06/27/20 from [facility]  Attempts:  3rd Attempt  Reason for unsuccessful TCM follow-up call:  No answer/busy. Phone rings x2. No answer. Unable to leave message.Will attempt secondary number on file.    Transition Care Management Unsuccessful Follow-up Telephone Call  Date of discharge and from where:  06/27/20 from [facility]  Attempts:  4th attempt  Reason for unsuccessful TCM follow-up call:  Left voice message. Called mobile chart and was able to leave a message. Made aware okay to call the office as needed and keep scheduled hospital follow up.

## 2020-07-04 NOTE — Telephone Encounter (Signed)
Called both numbers on file. See other note. Unable to make contact with patient. Left message on secondary pone number. Unable to leave message on primary phone number. TCM qualified per 2 attempts. Nurse Health Advisor made 4 attempts within appropriate timeframe.

## 2020-07-05 DIAGNOSIS — I482 Chronic atrial fibrillation, unspecified: Secondary | ICD-10-CM | POA: Diagnosis not present

## 2020-07-05 DIAGNOSIS — K5790 Diverticulosis of intestine, part unspecified, without perforation or abscess without bleeding: Secondary | ICD-10-CM | POA: Diagnosis not present

## 2020-07-05 DIAGNOSIS — I10 Essential (primary) hypertension: Secondary | ICD-10-CM | POA: Diagnosis not present

## 2020-07-05 DIAGNOSIS — H409 Unspecified glaucoma: Secondary | ICD-10-CM | POA: Diagnosis not present

## 2020-07-05 DIAGNOSIS — Q2733 Arteriovenous malformation of digestive system vessel: Secondary | ICD-10-CM | POA: Diagnosis not present

## 2020-07-05 DIAGNOSIS — K922 Gastrointestinal hemorrhage, unspecified: Secondary | ICD-10-CM | POA: Diagnosis not present

## 2020-07-05 DIAGNOSIS — I25810 Atherosclerosis of coronary artery bypass graft(s) without angina pectoris: Secondary | ICD-10-CM | POA: Diagnosis not present

## 2020-07-05 DIAGNOSIS — G20 Parkinson's disease: Secondary | ICD-10-CM | POA: Diagnosis not present

## 2020-07-05 DIAGNOSIS — D62 Acute posthemorrhagic anemia: Secondary | ICD-10-CM | POA: Diagnosis not present

## 2020-07-05 DIAGNOSIS — E785 Hyperlipidemia, unspecified: Secondary | ICD-10-CM | POA: Diagnosis not present

## 2020-07-05 NOTE — Telephone Encounter (Signed)
For your information  

## 2020-07-09 DIAGNOSIS — G20 Parkinson's disease: Secondary | ICD-10-CM | POA: Diagnosis not present

## 2020-07-09 DIAGNOSIS — K922 Gastrointestinal hemorrhage, unspecified: Secondary | ICD-10-CM | POA: Diagnosis not present

## 2020-07-09 DIAGNOSIS — I25810 Atherosclerosis of coronary artery bypass graft(s) without angina pectoris: Secondary | ICD-10-CM | POA: Diagnosis not present

## 2020-07-09 DIAGNOSIS — K5790 Diverticulosis of intestine, part unspecified, without perforation or abscess without bleeding: Secondary | ICD-10-CM | POA: Diagnosis not present

## 2020-07-09 DIAGNOSIS — E785 Hyperlipidemia, unspecified: Secondary | ICD-10-CM | POA: Diagnosis not present

## 2020-07-09 DIAGNOSIS — Q2733 Arteriovenous malformation of digestive system vessel: Secondary | ICD-10-CM | POA: Diagnosis not present

## 2020-07-09 DIAGNOSIS — I10 Essential (primary) hypertension: Secondary | ICD-10-CM | POA: Diagnosis not present

## 2020-07-09 DIAGNOSIS — D62 Acute posthemorrhagic anemia: Secondary | ICD-10-CM | POA: Diagnosis not present

## 2020-07-09 DIAGNOSIS — I482 Chronic atrial fibrillation, unspecified: Secondary | ICD-10-CM | POA: Diagnosis not present

## 2020-07-09 DIAGNOSIS — H409 Unspecified glaucoma: Secondary | ICD-10-CM | POA: Diagnosis not present

## 2020-07-11 ENCOUNTER — Other Ambulatory Visit: Payer: Self-pay

## 2020-07-11 ENCOUNTER — Ambulatory Visit (INDEPENDENT_AMBULATORY_CARE_PROVIDER_SITE_OTHER): Payer: Medicare HMO | Admitting: Internal Medicine

## 2020-07-11 VITALS — BP 118/68 | HR 63 | Temp 97.6°F | Resp 16 | Ht 65.0 in | Wt 136.0 lb

## 2020-07-11 DIAGNOSIS — I10 Essential (primary) hypertension: Secondary | ICD-10-CM

## 2020-07-11 DIAGNOSIS — J411 Mucopurulent chronic bronchitis: Secondary | ICD-10-CM | POA: Diagnosis not present

## 2020-07-11 DIAGNOSIS — I429 Cardiomyopathy, unspecified: Secondary | ICD-10-CM

## 2020-07-11 DIAGNOSIS — G231 Progressive supranuclear ophthalmoplegia [Steele-Richardson-Olszewski]: Secondary | ICD-10-CM | POA: Diagnosis not present

## 2020-07-11 DIAGNOSIS — K922 Gastrointestinal hemorrhage, unspecified: Secondary | ICD-10-CM | POA: Diagnosis not present

## 2020-07-11 DIAGNOSIS — I482 Chronic atrial fibrillation, unspecified: Secondary | ICD-10-CM | POA: Diagnosis not present

## 2020-07-11 DIAGNOSIS — I2510 Atherosclerotic heart disease of native coronary artery without angina pectoris: Secondary | ICD-10-CM | POA: Diagnosis not present

## 2020-07-11 DIAGNOSIS — R739 Hyperglycemia, unspecified: Secondary | ICD-10-CM

## 2020-07-11 DIAGNOSIS — D508 Other iron deficiency anemias: Secondary | ICD-10-CM

## 2020-07-11 DIAGNOSIS — E871 Hypo-osmolality and hyponatremia: Secondary | ICD-10-CM

## 2020-07-11 DIAGNOSIS — I700 Atherosclerosis of aorta: Secondary | ICD-10-CM

## 2020-07-11 DIAGNOSIS — E7800 Pure hypercholesterolemia, unspecified: Secondary | ICD-10-CM

## 2020-07-11 LAB — BASIC METABOLIC PANEL
BUN: 20 mg/dL (ref 6–23)
CO2: 28 mEq/L (ref 19–32)
Calcium: 8.4 mg/dL (ref 8.4–10.5)
Chloride: 102 mEq/L (ref 96–112)
Creatinine, Ser: 0.77 mg/dL (ref 0.40–1.50)
GFR: 77.44 mL/min (ref >60.00)
Glucose, Bld: 120 mg/dL — ABNORMAL HIGH (ref 70–99)
Potassium: 3.9 mEq/L (ref 3.5–5.1)
Sodium: 136 mEq/L (ref 135–145)

## 2020-07-11 LAB — CBC WITH DIFFERENTIAL/PLATELET
Basophils Absolute: 0 10*3/uL (ref 0.0–0.1)
Basophils Relative: 0.6 % (ref 0.0–3.0)
Eosinophils Absolute: 0 10*3/uL (ref 0.0–0.7)
Eosinophils Relative: 0.5 % (ref 0.0–5.0)
HCT: 27.2 % — ABNORMAL LOW (ref 39.0–52.0)
Hemoglobin: 9 g/dL — ABNORMAL LOW (ref 13.0–17.0)
Lymphocytes Relative: 13.9 % (ref 12.0–46.0)
Lymphs Abs: 0.6 10*3/uL — ABNORMAL LOW (ref 0.7–4.0)
MCHC: 33 g/dL (ref 30.0–36.0)
MCV: 91.9 fl (ref 78.0–100.0)
Monocytes Absolute: 0.5 10*3/uL (ref 0.1–1.0)
Monocytes Relative: 12.9 % — ABNORMAL HIGH (ref 3.0–12.0)
Neutro Abs: 3 10*3/uL (ref 1.4–7.7)
Neutrophils Relative %: 72.1 % (ref 43.0–77.0)
Platelets: 225 10*3/uL (ref 150.0–400.0)
RBC: 2.96 Mil/uL — ABNORMAL LOW (ref 4.22–5.81)
RDW: 15.6 % — ABNORMAL HIGH (ref 11.5–15.5)
WBC: 4.1 10*3/uL (ref 4.0–10.5)

## 2020-07-11 LAB — FERRITIN: Ferritin: 33.3 ng/mL (ref 22.0–322.0)

## 2020-07-11 NOTE — Progress Notes (Addendum)
Subjective:    [name]: Tyler Stout, male    DOB: [birth year], 87 y.o.   MRN: [id number]  HPI This visit occurred during the SARS-CoV-2 public health emergency.  Safety protocols were in place, including screening questions prior to the visit, additional usage of staff PPE, and extensive cleaning of exam room while observing appropriate contact time as indicated for disinfecting solutions.   Patient here for hospital follow up.  Hospitalized 06/23/20 - 06/27/20 with recurrent GI bleed.  Hgb trended down to below 7 on 06/25/20.  Was given 2 units or prbc's.  GI consulted, but given relative stability - no procedures done.  No further bleeding noted.  Felt to likely be diverticular bleed.  Aspirin held.  Blood pressure medications held.  Since discharge, he has done relatively well.  No further bleeding.  Eating.  No nausea or vomiting.  No abdominal pain.  Having bowel movements.  No increased sob.  Breathing overall stable.  Some cough.     Past Medical History:  Diagnosis Date   Anemia    iron deficiency   Arthritis    Atrial fibrillation ([place])    Bilateral renal cysts    worked up by Dr Bernardo Heater   BPH (benign prostatic hypertrophy)    CAD (coronary artery disease)    s/p bypass graft x 4 (1991)   Diverticulosis    GERD (gastroesophageal reflux disease)    GI bleed    Glaucoma    Hypercholesterolemia    Hypertension    Pancreatitis    x2 (1994 and 1998), presumed to be gallstone pancreatitis.  s/p ERCP and sphincterotomy   Past Surgical History:  Procedure Laterality Date   APPENDECTOMY     CHOLECYSTECTOMY  1994   COLONOSCOPY N/A 07/14/2019   Procedure: COLONOSCOPY;  Surgeon: Virgel Manifold, MD;  Location: [facility] ENDOSCOPY;  Service: Endoscopy;  Laterality: N/A;   CORONARY ARTERY BYPASS GRAFT     x 4,  1991   EMBOLIZATION Right 05/25/2019   Procedure: EMBOLIZATION (Right Colon);  Surgeon: Katha Cabal, MD;  Location: [place] [facility];  Service: Cardiovascular;   Laterality: Right;   INGUINAL HERNIA REPAIR  2009   open heart surgery  1991   Family History  Problem Relation Age of Onset   Throat cancer Father        oropharyngeal cancer   Kidney disease Sister    Heart disease Other    Colon cancer Neg Hx    Prostate cancer Neg Hx    Social History   Socioeconomic History   Marital status: Married    Spouse name: Not on file   Number of children: Not on file   Years of education: Not on file   Highest education level: Not on file  Occupational History   Not on file  Tobacco Use   Smoking status: Former    Packs/day: 2.00    Years: 25.00    Pack years: 50.00    Types: Cigarettes    Quit date: 01/14/1964    Years since quitting: 56.5   Smokeless tobacco: Never   Tobacco comments:    smoked 2 packs/day, 1 pipe and 1 cigar once in a while.  Substance and Sexual Activity   Alcohol use: No    Alcohol/week: 0.0 standard drinks   Drug use: No   Sexual activity: Not on file  Other Topics Concern   Not on file  Social History Narrative   Not on file  Social Determinants of Health   Financial Resource Strain: Low Risk    Difficulty of Paying Living Expenses: Not hard at all  Food Insecurity: No Food Insecurity   Worried About Charity fundraiser in the Last Year: Never true   [place] in the Last Year: Never true  Transportation Needs: No Transportation Needs   Lack of Transportation (Medical): No   Lack of Transportation (Non-Medical): No  Physical Activity: Insufficiently Active   Days of Exercise per Week: 1 day   Minutes of Exercise per Session: 60 min  Stress: No Stress Concern Present   Feeling of Stress : Not at all  Social Connections: Unknown   Frequency of Communication with Friends and Family: Not on file   Frequency of Social Gatherings with Friends and Family: More than three times a week   Attends Religious Services: Not on file   Active Member of Clubs or Organizations: Not on file   Attends Theatre manager Meetings: Not on file   Marital Status: Married    Outpatient Encounter Medications as of 07/11/2020  Medication Sig   albuterol (VENTOLIN HFA) 108 (90 Base) MCG/ACT inhaler Inhale 2 puffs into the lungs every 6 (six) hours as needed for wheezing or shortness of breath.   ascorbic acid (VITAMIN C) 250 MG CHEW Chew 250 mg by mouth daily.   atorvastatin (LIPITOR) 20 MG tablet TAKE 1 TABLET (20 MG TOTAL) BY MOUTH DAILY.   azelastine (ASTELIN) 0.1 % nasal spray PLACE 1 SPRAY INTO BOTH NOSTRILS 2 (TWO) TIMES DAILY (Patient taking differently: Place 1 spray into both nostrils 2 (two) times daily as needed for rhinitis or allergies.)   BREO ELLIPTA 200-25 MCG/INH AEPB Inhale 1 puff into the lungs daily.   carbidopa-levodopa (SINEMET CR) 50-200 MG tablet Take 1 tablet by mouth at bedtime.   carbidopa-levodopa (SINEMET IR) 25-100 MG tablet Take 2 tablets by mouth 3 (three) times daily.   Cholecalciferol (VITAMIN D) 50 MCG (2000 UT) tablet Take 2,000 Units by mouth every evening.    citalopram (CELEXA) 20 MG tablet TAKE 1 TABLET (20 MG TOTAL) BY MOUTH DAILY.   fluticasone (FLONASE) 50 MCG/ACT nasal spray SPRAY 2 SPRAYS INTO EACH NOSTRIL EVERY DAY (Patient taking differently: Place 2 sprays into both nostrils daily as needed for allergies or rhinitis.)   furosemide (LASIX) 20 MG tablet Take 20 mg by mouth daily as needed.   guaiFENesin (MUCINEX) 600 MG 12 hr tablet Take 600 mg by mouth daily.   latanoprost (XALATAN) 0.005 % ophthalmic solution Place 1 drop into both eyes at bedtime.    metoprolol tartrate (LOPRESSOR) 25 MG tablet Take 0.5 mg by mouth 2 (two) times daily.   Multiple Vitamin (MULTIVITAMIN WITH MINERALS) TABS tablet Take 1 tablet by mouth every evening.    pantoprazole (PROTONIX) 40 MG tablet TAKE 1 TABLET BY MOUTH EVERY DAY   potassium chloride (KLOR-CON) 10 MEQ tablet Take one tablet by mouth with lasix. (Patient taking differently: Take 10 mEq by mouth daily as needed.)    rivastigmine (EXELON) 4.6 mg/24hr Place 1 patch onto the skin daily.   tamsulosin (FLOMAX) 0.4 MG CAPS capsule TAKE 1 CAPSULE BY MOUTH EVERY DAY   [DISCONTINUED] telmisartan (MICARDIS) 20 MG tablet Take 20 mg by mouth daily. (Patient not taking: Reported on 07/11/2020)   No facility-administered encounter medications on file as of 07/11/2020.     Review of Systems  Constitutional:  Negative for appetite change and unexpected weight change.  HENT:  Negative for sinus pressure.   Respiratory:  Negative for chest tightness and shortness of breath.        Some cough.    Cardiovascular:  Negative for chest pain, palpitations and leg swelling.  Gastrointestinal:  Negative for abdominal pain, diarrhea, nausea and vomiting.  Genitourinary:  Negative for difficulty urinating and dysuria.  Musculoskeletal:  Negative for joint swelling and myalgias.  Skin:  Negative for color change and rash.  Neurological:  Negative for dizziness, light-headedness and headaches.  Psychiatric/Behavioral:  Negative for agitation and dysphoric mood.       Objective:    Physical Exam Vitals reviewed.  Constitutional:      General: He is not in acute distress.    Appearance: Normal appearance. He is well-developed.  HENT:     Head: Normocephalic and atraumatic.     Right Ear: External ear normal.     Left Ear: External ear normal.  Eyes:     General: No scleral icterus.       Right eye: No discharge.        Left eye: No discharge.     Conjunctiva/sclera: Conjunctivae normal.  Cardiovascular:     Rate and Rhythm: Normal rate and regular rhythm.  Pulmonary:     Effort: Pulmonary effort is normal. No respiratory distress.     Breath sounds: Normal breath sounds.  Abdominal:     General: Bowel sounds are normal.     Palpations: Abdomen is soft.     Tenderness: There is no abdominal tenderness.  Musculoskeletal:        General: No swelling or tenderness.     Cervical back: Neck supple. No tenderness.   Lymphadenopathy:     Cervical: No cervical adenopathy.  Skin:    Findings: No erythema or rash.  Neurological:     Mental Status: He is alert.  Psychiatric:        Mood and Affect: Mood normal.        Behavior: Behavior normal.    BP 118/68   Pulse 63   Temp 97.6 F (36.4 C)   Resp 16   Ht '5\' 5"'  (1.651 m)   Wt 136 lb (61.7 kg)   SpO2 99%   BMI 22.63 kg/m  Wt Readings from Last 3 Encounters:  07/11/20 136 lb (61.7 kg)  06/12/20 133 lb 9.6 oz (60.6 kg)  04/10/20 132 lb 6.4 oz (60.1 kg)    Outpatient Encounter Medications as of 07/11/2020  Medication Sig   albuterol (VENTOLIN HFA) 108 (90 Base) MCG/ACT inhaler Inhale 2 puffs into the lungs every 6 (six) hours as needed for wheezing or shortness of breath.   ascorbic acid (VITAMIN C) 250 MG CHEW Chew 250 mg by mouth daily.   atorvastatin (LIPITOR) 20 MG tablet TAKE 1 TABLET (20 MG TOTAL) BY MOUTH DAILY.   azelastine (ASTELIN) 0.1 % nasal spray PLACE 1 SPRAY INTO BOTH NOSTRILS 2 (TWO) TIMES DAILY (Patient taking differently: Place 1 spray into both nostrils 2 (two) times daily as needed for rhinitis or allergies.)   BREO ELLIPTA 200-25 MCG/INH AEPB Inhale 1 puff into the lungs daily.   carbidopa-levodopa (SINEMET CR) 50-200 MG tablet Take 1 tablet by mouth at bedtime.   carbidopa-levodopa (SINEMET IR) 25-100 MG tablet Take 2 tablets by mouth 3 (three) times daily.   Cholecalciferol (VITAMIN D) 50 MCG (2000 UT) tablet Take 2,000 Units by mouth every evening.    citalopram (CELEXA) 20 MG tablet TAKE 1 TABLET (20  MG TOTAL) BY MOUTH DAILY.   fluticasone (FLONASE) 50 MCG/ACT nasal spray SPRAY 2 SPRAYS INTO EACH NOSTRIL EVERY DAY (Patient taking differently: Place 2 sprays into both nostrils daily as needed for allergies or rhinitis.)   furosemide (LASIX) 20 MG tablet Take 20 mg by mouth daily as needed.   guaiFENesin (MUCINEX) 600 MG 12 hr tablet Take 600 mg by mouth daily.   latanoprost (XALATAN) 0.005 % ophthalmic solution Place 1  drop into both eyes at bedtime.    metoprolol tartrate (LOPRESSOR) 25 MG tablet Take 0.5 mg by mouth 2 (two) times daily.   Multiple Vitamin (MULTIVITAMIN WITH MINERALS) TABS tablet Take 1 tablet by mouth every evening.    pantoprazole (PROTONIX) 40 MG tablet TAKE 1 TABLET BY MOUTH EVERY DAY   potassium chloride (KLOR-CON) 10 MEQ tablet Take one tablet by mouth with lasix. (Patient taking differently: Take 10 mEq by mouth daily as needed.)   rivastigmine (EXELON) 4.6 mg/24hr Place 1 patch onto the skin daily.   tamsulosin (FLOMAX) 0.4 MG CAPS capsule TAKE 1 CAPSULE BY MOUTH EVERY DAY   [DISCONTINUED] telmisartan (MICARDIS) 20 MG tablet Take 20 mg by mouth daily. (Patient not taking: Reported on 07/11/2020)   No facility-administered encounter medications on file as of 07/11/2020.     Lab Results  Component Value Date   WBC 4.1 07/11/2020   HGB 9.0 (L) 07/11/2020   HCT 27.2 (L) 07/11/2020   PLT 225.0 07/11/2020   GLUCOSE 120 (H) 07/11/2020   CHOL 107 04/10/2020   TRIG 74.0 04/10/2020   HDL 49.50 04/10/2020   LDLCALC 43 04/10/2020   ALT 5 04/10/2020   AST 20 04/10/2020   NA 136 07/11/2020   K 3.9 07/11/2020   CL 102 07/11/2020   CREATININE 0.77 07/11/2020   BUN 20 07/11/2020   CO2 28 07/11/2020   TSH 1.94 04/10/2020   INR 1.1 04/04/2020   HGBA1C 5.7 04/10/2020       Assessment & Plan:   Problem List Items Addressed This Visit     Anemia    Recheck cbc today as outlined.        Relevant Orders   CBC with Differential/Platelet (Completed)   Ferritin (Completed)   CAD (coronary artery disease)    Followed by cardiology.  Stable.        Cardiomyopathy ([place])    No evidence of volume overload.  Back on lasix prn.  Has taken a few doses since being home.  Discussed monitoring weights.  Lasix prn.  Follow.        Chronic atrial fibrillation ([facility])    Off coumadin secondary to GI bleeds.  On metoprolol.  Off aspirin secondary to recent GI bleed.  Discussed risk -  possible stroke, etc.  Follow.        Chronic bronchitis ([facility])    No increased sob.  mucinex for cough.  Follow.        Essential hypertension - Primary   Relevant Orders   Basic metabolic panel (Completed)   GI bleed    Recently admitted as outlined.  Received 2 units prbc's.  No further bleeding.  Recheck cbc today.  Hold aspirin.  Follow.  GI elected to hold on f/u scope.         Hypercholesteremia    Continue lipitor.  Follow lipid panel and liver function tests.         Hyperglycemia    Follow met b and a1c.  Hypertension    Continue metoprolol.  Off micardis.   Blood pressure stable.  Follow pressures.  Follow metabolic panel.  Continue to hold micardis for now.  Send in blood pressure readings.  If increase, will add back micardis.        Hyponatremia    Recheck sodium to confirm stable.        Progressive supranuclear palsy ([place])    Followed by neurology.  Stable.        Thoracic aortic atherosclerosis ([facility])    Continue lipitor.          [name], MD

## 2020-07-12 ENCOUNTER — Other Ambulatory Visit: Payer: Self-pay | Admitting: Internal Medicine

## 2020-07-12 ENCOUNTER — Other Ambulatory Visit (INDEPENDENT_AMBULATORY_CARE_PROVIDER_SITE_OTHER): Payer: Medicare HMO

## 2020-07-12 DIAGNOSIS — D649 Anemia, unspecified: Secondary | ICD-10-CM

## 2020-07-12 LAB — IBC + FERRITIN
Ferritin: 32.7 ng/mL (ref 22.0–322.0)
Iron: 28 ug/dL — ABNORMAL LOW (ref 42–165)
Saturation Ratios: 8 % — ABNORMAL LOW (ref 20.0–50.0)
Transferrin: 251 mg/dL (ref 212.0–360.0)

## 2020-07-12 LAB — VITAMIN B12: Vitamin B-12: 521 pg/mL (ref 211–911)

## 2020-07-12 NOTE — Progress Notes (Signed)
Orders placed for f/u labs.  

## 2020-07-12 NOTE — Addendum Note (Signed)
Addended by: Leeanne Rio on: 07/12/2020 08:23 AM   Modules accepted: Orders

## 2020-07-13 ENCOUNTER — Other Ambulatory Visit: Payer: Self-pay | Admitting: Internal Medicine

## 2020-07-13 DIAGNOSIS — I10 Essential (primary) hypertension: Secondary | ICD-10-CM | POA: Diagnosis not present

## 2020-07-13 DIAGNOSIS — I482 Chronic atrial fibrillation, unspecified: Secondary | ICD-10-CM | POA: Diagnosis not present

## 2020-07-13 DIAGNOSIS — I25810 Atherosclerosis of coronary artery bypass graft(s) without angina pectoris: Secondary | ICD-10-CM | POA: Diagnosis not present

## 2020-07-13 DIAGNOSIS — G20 Parkinson's disease: Secondary | ICD-10-CM | POA: Diagnosis not present

## 2020-07-13 DIAGNOSIS — K922 Gastrointestinal hemorrhage, unspecified: Secondary | ICD-10-CM | POA: Diagnosis not present

## 2020-07-13 DIAGNOSIS — K5790 Diverticulosis of intestine, part unspecified, without perforation or abscess without bleeding: Secondary | ICD-10-CM | POA: Diagnosis not present

## 2020-07-13 DIAGNOSIS — D649 Anemia, unspecified: Secondary | ICD-10-CM

## 2020-07-13 DIAGNOSIS — D62 Acute posthemorrhagic anemia: Secondary | ICD-10-CM | POA: Diagnosis not present

## 2020-07-13 DIAGNOSIS — E785 Hyperlipidemia, unspecified: Secondary | ICD-10-CM | POA: Diagnosis not present

## 2020-07-13 DIAGNOSIS — H409 Unspecified glaucoma: Secondary | ICD-10-CM | POA: Diagnosis not present

## 2020-07-13 DIAGNOSIS — Q2733 Arteriovenous malformation of digestive system vessel: Secondary | ICD-10-CM | POA: Diagnosis not present

## 2020-07-13 NOTE — Progress Notes (Signed)
Order placed for f/u cbc.   

## 2020-07-16 ENCOUNTER — Encounter: Payer: Self-pay | Admitting: Internal Medicine

## 2020-07-16 NOTE — Assessment & Plan Note (Addendum)
Continue metoprolol.  Off micardis.   Blood pressure stable.  Follow pressures.  Follow metabolic panel.  Continue to hold micardis for now.  Send in blood pressure readings.  If increase, will add back micardis.

## 2020-07-16 NOTE — Assessment & Plan Note (Signed)
Continue lipitor.  Follow lipid panel and liver function tests.   

## 2020-07-16 NOTE — Assessment & Plan Note (Signed)
Recently admitted as outlined.  Received 2 units prbc's.  No further bleeding.  Recheck cbc today.  Hold aspirin.  Follow.  GI elected to hold on f/u scope.

## 2020-07-16 NOTE — Assessment & Plan Note (Signed)
Recheck cbc today as outlined.

## 2020-07-16 NOTE — Assessment & Plan Note (Signed)
Continue lipitor  ?

## 2020-07-16 NOTE — Assessment & Plan Note (Signed)
Followed by cardiology. Stable.   

## 2020-07-16 NOTE — Assessment & Plan Note (Signed)
Followed by neurology.  Stable  

## 2020-07-16 NOTE — Assessment & Plan Note (Signed)
No increased sob.  mucinex for cough.  Follow.

## 2020-07-16 NOTE — Assessment & Plan Note (Signed)
Off coumadin secondary to GI bleeds.  On metoprolol.  Off aspirin secondary to recent GI bleed.  Discussed risk - possible stroke, etc.  Follow.

## 2020-07-16 NOTE — Assessment & Plan Note (Signed)
Recheck sodium to confirm stable.

## 2020-07-16 NOTE — Assessment & Plan Note (Signed)
Follow met b and a1c.  

## 2020-07-16 NOTE — Assessment & Plan Note (Signed)
No evidence of volume overload.  Back on lasix prn.  Has taken a few doses since being home.  Discussed monitoring weights.  Lasix prn.  Follow.

## 2020-07-17 ENCOUNTER — Telehealth: Payer: Self-pay | Admitting: Internal Medicine

## 2020-07-17 ENCOUNTER — Other Ambulatory Visit: Payer: Self-pay

## 2020-07-17 ENCOUNTER — Other Ambulatory Visit (INDEPENDENT_AMBULATORY_CARE_PROVIDER_SITE_OTHER): Payer: Medicare HMO

## 2020-07-17 DIAGNOSIS — R300 Dysuria: Secondary | ICD-10-CM | POA: Diagnosis not present

## 2020-07-17 NOTE — Telephone Encounter (Addendum)
Called and spoke with patient wife (DPR) and advised with all patient past HX and recent admission PCP would rather have him evaluated at ER due to the seriousness of illness HX. Patient wife stated she would have daughter call office.

## 2020-07-17 NOTE — Telephone Encounter (Signed)
I can work him in Architectural technologist as we discussed.

## 2020-07-17 NOTE — Telephone Encounter (Signed)
Opened in error

## 2020-07-17 NOTE — Telephone Encounter (Signed)
Given age, fever and symptoms - he needs to be seen.  Was just in hospital for GI bleed.  Would prefer him to be evaluated.

## 2020-07-17 NOTE — Telephone Encounter (Signed)
Patient daughter stated he will not go anywhere to see anyone but Dr. Nicki Reaper. And will not go to ER or UC and wait 12 hours to be seen.

## 2020-07-17 NOTE — Telephone Encounter (Signed)
Thank you :)

## 2020-07-17 NOTE — Telephone Encounter (Signed)
Daughter bringing UA and scheduled Virtual for tomorrow at 1230

## 2020-07-17 NOTE — Addendum Note (Signed)
Addended by: Leeanne Rio on: 07/17/2020 02:44 PM   Modules accepted: Orders

## 2020-07-17 NOTE — Addendum Note (Signed)
Addended by: Nanci Pina on: 07/17/2020 02:16 PM   Modules accepted: Orders

## 2020-07-17 NOTE — Telephone Encounter (Signed)
Called Olivia Mackie [facility]) no answer left message to call office if patient having symptoms needs to be triaged Access Nurse.

## 2020-07-17 NOTE — Telephone Encounter (Signed)
Patient's daughter Olivia Mackie called. Patient is not doing well and would like to speak to [place].

## 2020-07-17 NOTE — Telephone Encounter (Addendum)
Patient daughter called ask about labs for last week stating patient urine still dark in color  Sunday morning had hard time getting going but thought related to PSP , had temp on Sunday Night 100.7 gave tylenol, none reported since daughter ask if we could just check his urine I advised to pick up sterile cup and we could run Ua. PCP okay with this plan?

## 2020-07-17 NOTE — Telephone Encounter (Signed)
Transfer to Access Nurse  PT daughter was returning a phone call. PT daughter stated she had some concerns in regards to labs that were going to be done for PT and wanted a callback. PT also stated that she was concern about the PT urine being dark and smelly and transferred them to Access Nurse and advise no apts available.

## 2020-07-18 ENCOUNTER — Telehealth (INDEPENDENT_AMBULATORY_CARE_PROVIDER_SITE_OTHER): Payer: Medicare HMO | Admitting: Internal Medicine

## 2020-07-18 ENCOUNTER — Telehealth: Payer: Self-pay

## 2020-07-18 DIAGNOSIS — K625 Hemorrhage of anus and rectum: Secondary | ICD-10-CM | POA: Diagnosis not present

## 2020-07-18 DIAGNOSIS — I10 Essential (primary) hypertension: Secondary | ICD-10-CM | POA: Diagnosis not present

## 2020-07-18 DIAGNOSIS — R82998 Other abnormal findings in urine: Secondary | ICD-10-CM | POA: Diagnosis not present

## 2020-07-18 DIAGNOSIS — I429 Cardiomyopathy, unspecified: Secondary | ICD-10-CM | POA: Diagnosis not present

## 2020-07-18 DIAGNOSIS — I700 Atherosclerosis of aorta: Secondary | ICD-10-CM

## 2020-07-18 DIAGNOSIS — I482 Chronic atrial fibrillation, unspecified: Secondary | ICD-10-CM | POA: Diagnosis not present

## 2020-07-18 DIAGNOSIS — G231 Progressive supranuclear ophthalmoplegia [Steele-Richardson-Olszewski]: Secondary | ICD-10-CM

## 2020-07-18 LAB — URINALYSIS, ROUTINE W REFLEX MICROSCOPIC
Bacteria, UA: NONE SEEN /HPF
Bilirubin Urine: NEGATIVE
Glucose, UA: NEGATIVE
Hgb urine dipstick: NEGATIVE
Hyaline Cast: NONE SEEN /LPF
Nitrite: NEGATIVE
Specific Gravity, Urine: 1.022 (ref 1.001–1.035)
Squamous Epithelial / HPF: NONE SEEN /HPF (ref <=5)
WBC, UA: NONE SEEN /HPF (ref 0–5)
pH: 5.5 (ref 5.0–8.0)

## 2020-07-18 LAB — URINE CULTURE
MICRO NUMBER:: 12081782
SPECIMEN QUALITY:: ADEQUATE

## 2020-07-18 LAB — MICROSCOPIC MESSAGE

## 2020-07-18 NOTE — Progress Notes (Signed)
[name]: Tyler Stout, male   DOB: 13-Apr-1925, 87 y.o.   MRN: [id number]   Virtual Visit via telephone Note  This visit type was conducted due to national recommendations for restrictions regarding the COVID-19 pandemic (e.g. social distancing).  This format is felt to be most appropriate for this patient at this time.  All issues noted in this document were discussed and addressed.  No physical exam was performed (except for noted visual exam findings with Video Visits).   I connected with [name] by telephone and verified that I am speaking with the correct person using two identifiers. Location patient: home Location provider: work Persons participating in the telephone visit: patient, provider and pts daughter Olivia Mackie.   The limitations, risks, security and privacy concerns of performing an evaluation and management service by telephone and the availability of in person appointments have been discussed.  It has also been discussed with the patient that there may be a patient responsible charge related to this service. The patient expressed understanding and agreed to proceed.   Reason for visit: work in appt.   HPI: Work in for concerns regarding a possible UTI.  States that on 07/15/20 - did not feel well. Could not get legs to move well.  Temp 100.4.  that evening 100.7.  minimal disorientation.  No dysuria.  No vomiting or diarrhea.  Urine dark.  Eating.  Temp 99 yesterday am.  Family started encouraging him to drink.  Has been drinking more.  Feels better today.  No fever today.  Blood pressure doing ok.  Weight stable.  No headache and no rash.  Discussed urine results.     ROS: See pertinent positives and negatives per HPI.  Past Medical History:  Diagnosis Date   Anemia    iron deficiency   Arthritis    Atrial fibrillation ([place])    Bilateral renal cysts    worked up by Dr Bernardo Heater   BPH (benign prostatic hypertrophy)    CAD (coronary artery disease)    s/p bypass  graft x 4 (1991)   Diverticulosis    GERD (gastroesophageal reflux disease)    GI bleed    Glaucoma    Hypercholesterolemia    Hypertension    Pancreatitis    x2 (1994 and 1998), presumed to be gallstone pancreatitis.  s/p ERCP and sphincterotomy    Past Surgical History:  Procedure Laterality Date   APPENDECTOMY     CHOLECYSTECTOMY  1994   COLONOSCOPY N/A 07/14/2019   Procedure: COLONOSCOPY;  Surgeon: Virgel Manifold, MD;  Location: [facility];  Service: Endoscopy;  Laterality: N/A;   CORONARY ARTERY BYPASS GRAFT     x 4,  1991   EMBOLIZATION Right 05/25/2019   Procedure: EMBOLIZATION (Right Colon);  Surgeon: Katha Cabal, MD;  Location: [facility];  Service: Cardiovascular;  Laterality: Right;   INGUINAL HERNIA REPAIR  2009   open heart surgery  1991    Family History  Problem Relation Age of Onset   Throat cancer Father        oropharyngeal cancer   Kidney disease Sister    Heart disease Other    Colon cancer Neg Hx    Prostate cancer Neg Hx     SOCIAL HX: reviewed.    Current Outpatient Medications:    albuterol (VENTOLIN HFA) 108 (90 Base) MCG/ACT inhaler, Inhale 2 puffs into the lungs every 6 (six) hours as needed for wheezing or shortness of breath., Disp: ,  Rfl:    ascorbic acid (VITAMIN C) 250 MG CHEW, Chew 250 mg by mouth daily., Disp: , Rfl:    atorvastatin (LIPITOR) 20 MG tablet, TAKE 1 TABLET (20 MG TOTAL) BY MOUTH DAILY., Disp: 90 tablet, Rfl: 3   azelastine (ASTELIN) 0.1 % nasal spray, PLACE 1 SPRAY INTO BOTH NOSTRILS 2 (TWO) TIMES DAILY (Patient taking differently: Place 1 spray into both nostrils 2 (two) times daily as needed for rhinitis or allergies.), Disp: 30 mL, Rfl: 1   BREO ELLIPTA 200-25 MCG/INH AEPB, Inhale 1 puff into the lungs daily., Disp: 60 each, Rfl: 2   carbidopa-levodopa (SINEMET CR) 50-200 MG tablet, Take 1 tablet by mouth at bedtime., Disp: , Rfl:    carbidopa-levodopa (SINEMET IR) 25-100 MG tablet, Take 2 tablets  by mouth 3 (three) times daily., Disp: , Rfl:    Cholecalciferol (VITAMIN D) 50 MCG (2000 UT) tablet, Take 2,000 Units by mouth every evening. , Disp: , Rfl:    citalopram (CELEXA) 20 MG tablet, TAKE 1 TABLET (20 MG TOTAL) BY MOUTH DAILY., Disp: 90 tablet, Rfl: 2   fluticasone (FLONASE) 50 MCG/ACT nasal spray, SPRAY 2 SPRAYS INTO EACH NOSTRIL EVERY DAY (Patient taking differently: Place 2 sprays into both nostrils daily as needed for allergies or rhinitis.), Disp: 48 mL, Rfl: 1   furosemide (LASIX) 20 MG tablet, Take 20 mg by mouth daily as needed., Disp: , Rfl:    guaiFENesin (MUCINEX) 600 MG 12 hr tablet, Take 600 mg by mouth daily., Disp: , Rfl:    latanoprost (XALATAN) 0.005 % ophthalmic solution, Place 1 drop into both eyes at bedtime. , Disp: , Rfl:    metoprolol tartrate (LOPRESSOR) 25 MG tablet, Take 0.5 mg by mouth 2 (two) times daily., Disp: , Rfl:    Multiple Vitamin (MULTIVITAMIN WITH MINERALS) TABS tablet, Take 1 tablet by mouth every evening. , Disp: , Rfl:    pantoprazole (PROTONIX) 40 MG tablet, TAKE 1 TABLET BY MOUTH EVERY DAY, Disp: 90 tablet, Rfl: 3   potassium chloride (KLOR-CON) 10 MEQ tablet, Take one tablet by mouth with lasix. (Patient taking differently: Take 10 mEq by mouth daily as needed.), Disp: 30 tablet, Rfl: 0   rivastigmine (EXELON) 4.6 mg/24hr, Place 1 patch onto the skin daily., Disp: , Rfl:    tamsulosin (FLOMAX) 0.4 MG CAPS capsule, TAKE 1 CAPSULE BY MOUTH EVERY DAY, Disp: 90 capsule, Rfl: 1  EXAM:  VITALS per patient if applicable: 756/43  GENERAL: alert. Sounds to be in no acute distress.  Answering questions appropriately.   PSYCH/NEURO: pleasant and cooperative, no obvious depression or anxiety, speech and thought processing grossly intact  ASSESSMENT AND PLAN:  Discussed the following assessment and plan:  Problem List Items Addressed This Visit     Cardiomyopathy ([place])    Not taking lasix since symptoms started.  Staying hydrated.  Weight  stable.  Breathing stable.  Follow.        Chronic atrial fibrillation ([facility])    Off coumadin secondary to GI bleeds.  On metoprolol.  Off aspirin secondary to recent GI bleed.  Follow.        Dark urine    Dark urine noticed recently.  Drinking more recently.  Feeling better today.  No fever today.  Urinalysis trace leukocytes, otherwise ok.  Culture pending.  Given he is doing better and feels better, hold abx until culture results are available.  Follow symptoms.  Call if change.         Hypertension  Continue metoprolol.  Blood pressure as outlined.  Continue to monitor pressures.  Follow metabolic panel.        Progressive supranuclear palsy ([place])    Followed by neurology.  Stable.        Rectal bleeding    Just admitted with rectal bleeding.  Recent hgb check 9.  Follow cbc.        Thoracic aortic atherosclerosis ([facility])    Continue lipitor.          Return if symptoms worsen or fail to improve.   I discussed the assessment and treatment plan with the patient. The patient was provided an opportunity to ask questions and all were answered. The patient agreed with the plan and demonstrated an understanding of the instructions.   The patient was advised to call back or seek an in-person evaluation if the symptoms worsen or if the condition fails to improve as anticipated.  I provided 23 minutes of non-face-to-face time during this encounter.   [name], MD

## 2020-07-18 NOTE — Telephone Encounter (Signed)
Providing Access Nurse documentation. See Telephone note from 07/17/2020

## 2020-07-19 DIAGNOSIS — I10 Essential (primary) hypertension: Secondary | ICD-10-CM | POA: Diagnosis not present

## 2020-07-19 DIAGNOSIS — G20 Parkinson's disease: Secondary | ICD-10-CM | POA: Diagnosis not present

## 2020-07-19 DIAGNOSIS — K5790 Diverticulosis of intestine, part unspecified, without perforation or abscess without bleeding: Secondary | ICD-10-CM | POA: Diagnosis not present

## 2020-07-19 DIAGNOSIS — K922 Gastrointestinal hemorrhage, unspecified: Secondary | ICD-10-CM | POA: Diagnosis not present

## 2020-07-19 DIAGNOSIS — Q2733 Arteriovenous malformation of digestive system vessel: Secondary | ICD-10-CM | POA: Diagnosis not present

## 2020-07-19 DIAGNOSIS — I25810 Atherosclerosis of coronary artery bypass graft(s) without angina pectoris: Secondary | ICD-10-CM | POA: Diagnosis not present

## 2020-07-19 DIAGNOSIS — I482 Chronic atrial fibrillation, unspecified: Secondary | ICD-10-CM | POA: Diagnosis not present

## 2020-07-19 DIAGNOSIS — H409 Unspecified glaucoma: Secondary | ICD-10-CM | POA: Diagnosis not present

## 2020-07-19 DIAGNOSIS — D62 Acute posthemorrhagic anemia: Secondary | ICD-10-CM | POA: Diagnosis not present

## 2020-07-19 DIAGNOSIS — E785 Hyperlipidemia, unspecified: Secondary | ICD-10-CM | POA: Diagnosis not present

## 2020-07-20 ENCOUNTER — Other Ambulatory Visit (INDEPENDENT_AMBULATORY_CARE_PROVIDER_SITE_OTHER): Payer: Medicare HMO

## 2020-07-20 ENCOUNTER — Other Ambulatory Visit: Payer: Self-pay

## 2020-07-20 DIAGNOSIS — D649 Anemia, unspecified: Secondary | ICD-10-CM | POA: Diagnosis not present

## 2020-07-20 LAB — CBC WITH DIFFERENTIAL/PLATELET
Basophils Absolute: 0 10*3/uL (ref 0.0–0.1)
Basophils Relative: 0.4 % (ref 0.0–3.0)
Eosinophils Absolute: 0 10*3/uL (ref 0.0–0.7)
Eosinophils Relative: 0.6 % (ref 0.0–5.0)
HCT: 28.1 % — ABNORMAL LOW (ref 39.0–52.0)
Hemoglobin: 9.4 g/dL — ABNORMAL LOW (ref 13.0–17.0)
Lymphocytes Relative: 10.5 % — ABNORMAL LOW (ref 12.0–46.0)
Lymphs Abs: 0.5 10*3/uL — ABNORMAL LOW (ref 0.7–4.0)
MCHC: 33.4 g/dL (ref 30.0–36.0)
MCV: 90.1 fl (ref 78.0–100.0)
Monocytes Absolute: 0.5 10*3/uL (ref 0.1–1.0)
Monocytes Relative: 12.1 % — ABNORMAL HIGH (ref 3.0–12.0)
Neutro Abs: 3.5 10*3/uL (ref 1.4–7.7)
Neutrophils Relative %: 76.4 % (ref 43.0–77.0)
Platelets: 286 10*3/uL (ref 150.0–400.0)
RBC: 3.12 Mil/uL — ABNORMAL LOW (ref 4.22–5.81)
RDW: 15.8 % — ABNORMAL HIGH (ref 11.5–15.5)
WBC: 4.5 10*3/uL (ref 4.0–10.5)

## 2020-07-21 ENCOUNTER — Encounter: Payer: Self-pay | Admitting: Internal Medicine

## 2020-07-21 DIAGNOSIS — R82998 Other abnormal findings in urine: Secondary | ICD-10-CM | POA: Insufficient documentation

## 2020-07-21 NOTE — Assessment & Plan Note (Signed)
Just admitted with rectal bleeding.  Recent hgb check 9.  Follow cbc.

## 2020-07-21 NOTE — Assessment & Plan Note (Signed)
Followed by neurology.  Stable  

## 2020-07-21 NOTE — Assessment & Plan Note (Signed)
Off coumadin secondary to GI bleeds.  On metoprolol.  Off aspirin secondary to recent GI bleed.  Follow.

## 2020-07-21 NOTE — Assessment & Plan Note (Signed)
Dark urine noticed recently.  Drinking more recently.  Feeling better today.  No fever today.  Urinalysis trace leukocytes, otherwise ok.  Culture pending.  Given he is doing better and feels better, hold abx until culture results are available.  Follow symptoms.  Call if change.

## 2020-07-21 NOTE — Assessment & Plan Note (Signed)
Not taking lasix since symptoms started.  Staying hydrated.  Weight stable.  Breathing stable.  Follow.

## 2020-07-21 NOTE — Assessment & Plan Note (Signed)
Continue metoprolol.  Blood pressure as outlined.  Continue to monitor pressures.  Follow metabolic panel.

## 2020-07-21 NOTE — Assessment & Plan Note (Signed)
Continue lipitor  ?

## 2020-07-23 ENCOUNTER — Other Ambulatory Visit: Payer: Self-pay | Admitting: *Deleted

## 2020-07-23 DIAGNOSIS — D649 Anemia, unspecified: Secondary | ICD-10-CM

## 2020-07-25 ENCOUNTER — Telehealth: Payer: Self-pay

## 2020-07-25 DIAGNOSIS — K922 Gastrointestinal hemorrhage, unspecified: Secondary | ICD-10-CM | POA: Diagnosis not present

## 2020-07-25 DIAGNOSIS — E785 Hyperlipidemia, unspecified: Secondary | ICD-10-CM | POA: Diagnosis not present

## 2020-07-25 DIAGNOSIS — H409 Unspecified glaucoma: Secondary | ICD-10-CM | POA: Diagnosis not present

## 2020-07-25 DIAGNOSIS — G20 Parkinson's disease: Secondary | ICD-10-CM | POA: Diagnosis not present

## 2020-07-25 DIAGNOSIS — I482 Chronic atrial fibrillation, unspecified: Secondary | ICD-10-CM | POA: Diagnosis not present

## 2020-07-25 DIAGNOSIS — I10 Essential (primary) hypertension: Secondary | ICD-10-CM | POA: Diagnosis not present

## 2020-07-25 DIAGNOSIS — D62 Acute posthemorrhagic anemia: Secondary | ICD-10-CM | POA: Diagnosis not present

## 2020-07-25 DIAGNOSIS — Q2733 Arteriovenous malformation of digestive system vessel: Secondary | ICD-10-CM | POA: Diagnosis not present

## 2020-07-25 DIAGNOSIS — K5790 Diverticulosis of intestine, part unspecified, without perforation or abscess without bleeding: Secondary | ICD-10-CM | POA: Diagnosis not present

## 2020-07-25 DIAGNOSIS — I25810 Atherosclerosis of coronary artery bypass graft(s) without angina pectoris: Secondary | ICD-10-CM | POA: Diagnosis not present

## 2020-07-25 NOTE — Telephone Encounter (Signed)
Received completed form for [email or web address] . Paperwork was faxed to [phone]. Copy placed in scan folder and a copy was placed in the Charge Form Folder in the front office.

## 2020-07-31 DIAGNOSIS — K922 Gastrointestinal hemorrhage, unspecified: Secondary | ICD-10-CM | POA: Diagnosis not present

## 2020-07-31 DIAGNOSIS — K5790 Diverticulosis of intestine, part unspecified, without perforation or abscess without bleeding: Secondary | ICD-10-CM | POA: Diagnosis not present

## 2020-07-31 DIAGNOSIS — G20 Parkinson's disease: Secondary | ICD-10-CM | POA: Diagnosis not present

## 2020-07-31 DIAGNOSIS — Q2733 Arteriovenous malformation of digestive system vessel: Secondary | ICD-10-CM | POA: Diagnosis not present

## 2020-07-31 DIAGNOSIS — D62 Acute posthemorrhagic anemia: Secondary | ICD-10-CM | POA: Diagnosis not present

## 2020-07-31 DIAGNOSIS — H409 Unspecified glaucoma: Secondary | ICD-10-CM | POA: Diagnosis not present

## 2020-07-31 DIAGNOSIS — I25810 Atherosclerosis of coronary artery bypass graft(s) without angina pectoris: Secondary | ICD-10-CM | POA: Diagnosis not present

## 2020-07-31 DIAGNOSIS — I10 Essential (primary) hypertension: Secondary | ICD-10-CM | POA: Diagnosis not present

## 2020-07-31 DIAGNOSIS — E785 Hyperlipidemia, unspecified: Secondary | ICD-10-CM | POA: Diagnosis not present

## 2020-07-31 DIAGNOSIS — I482 Chronic atrial fibrillation, unspecified: Secondary | ICD-10-CM | POA: Diagnosis not present

## 2020-08-02 DIAGNOSIS — G4733 Obstructive sleep apnea (adult) (pediatric): Secondary | ICD-10-CM | POA: Diagnosis not present

## 2020-08-03 ENCOUNTER — Ambulatory Visit: Payer: Medicare HMO | Admitting: Adult Health

## 2020-08-07 ENCOUNTER — Ambulatory Visit (INDEPENDENT_AMBULATORY_CARE_PROVIDER_SITE_OTHER): Payer: Medicare HMO | Admitting: Adult Health

## 2020-08-07 ENCOUNTER — Ambulatory Visit
Admission: RE | Admit: 2020-08-07 | Discharge: 2020-08-07 | Disposition: A | Discharge status: Home / Self Care | Payer: Medicare HMO | Source: Ambulatory Visit | Attending: Adult Health | Admitting: Adult Health

## 2020-08-07 ENCOUNTER — Encounter: Payer: Self-pay | Admitting: Adult Health

## 2020-08-07 ENCOUNTER — Other Ambulatory Visit: Payer: Self-pay

## 2020-08-07 VITALS — BP 120/60 | HR 68 | Temp 97.8°F | Ht 66.0 in | Wt 132.4 lb

## 2020-08-07 DIAGNOSIS — Z951 Presence of aortocoronary bypass graft: Secondary | ICD-10-CM | POA: Diagnosis not present

## 2020-08-07 DIAGNOSIS — I5189 Other ill-defined heart diseases: Secondary | ICD-10-CM

## 2020-08-07 DIAGNOSIS — J42 Unspecified chronic bronchitis: Secondary | ICD-10-CM | POA: Insufficient documentation

## 2020-08-07 DIAGNOSIS — J948 Other specified pleural conditions: Secondary | ICD-10-CM | POA: Diagnosis not present

## 2020-08-07 DIAGNOSIS — J929 Pleural plaque without asbestos: Secondary | ICD-10-CM | POA: Diagnosis not present

## 2020-08-07 NOTE — Patient Instructions (Addendum)
Chest xray today  Continue on BREO 1 puff daily , rinse after use.  Activity as tolerated Flutter valve Three times a day   Mucinex liquid As needed  for congestion .  Aspirations precautions as we discussed.  Follow up with Dr Patsey Berthold or Gwenn Teodoro NP in 2 -3 months and As needed

## 2020-08-07 NOTE — Progress Notes (Signed)
$'[name]$  ID: Tyler Stout, male    DOB: 07-19-1925, 87 y.o.   MRN: [id number]  Chief Complaint  Patient presents with   Follow-up    Referring provider: [name], MD  HPI: 87 year old former smoker with known history of progressive supranuclear palsy seen for pulmonary consult January 2020 for chronic cough and shortness of breath felt to have chronic bronchitis [place] history significant for cardiomyopathy, A. fib, coronary artery disease, hyperlipidemia  TEST/EVENTS :   08/07/2020 Follow up: Chronic bronchitis Patient returns for a follow-up visit.  Last seen January 2020.  Patient is accompanied by his daughter.  Patient has had 2 hospitalizations in the last few months for symptomatic anemia from recurrent GI bleed.  Lab work earlier this month showed hemoglobin was stable.  At 9.4.  He has had no known acute bleeding. Patient says he has intermittent congestion.  Does get short of breath with minimal activities.  Is sedentary.  Has generalized muscle weakness.  Has been prone to pneumonias in the past but has had not had pneumonia for greater than a year.  Patient says he does feel that food gets stuck in his throat at times.  Patient had a swallow evaluation August 2020.  Felt to have mild dysphagia and aspiration risk.  Was recommended for a dysphagia 3 diet., Patient denies any hemoptysis chest pain orthopnea PND or increased leg swelling.  Chest x-ray June 13, 2020 showed mild bilateral interstitial prominence possible interstitial edema. 2D echo in July 2020 showed EF at 60 to 65% normal right ventricular systolic function.  An elevated pulmonary artery pressure at 39.2 mmHg.  Mild to moderate tricuspid valve regurg.   Allergies  Allergen Reactions   Vitamin K2 Anaphylaxis   Ilosone [Erythromycin] Other (See Comments)    Reaction:  GI upset    Penicillins Rash and Other (See Comments)    Has patient had a PCN reaction causing immediate rash,  facial/tongue/throat swelling, SOB or lightheadedness with hypotension: No Has patient had a PCN reaction causing severe rash involving mucus membranes or skin necrosis: No Has patient had a PCN reaction that required hospitalization No Has patient had a PCN reaction occurring within the last 10 years: No If all of the above answers are "NO", then may proceed with Cephalosporin use.    Immunization History  Administered Date(s) Administered   Influenza Split 10/07/2012, 09/16/2018   Influenza, High Dose Seasonal PF 09/22/2016, 09/28/2017, 09/16/2018   Influenza,inj,Quad PF,6+ Mos 11/08/2014   Influenza-Unspecified 10/09/2015, 10/12/2019   PFIZER(Purple Top)SARS-COV-2 Vaccination 05/05/2019, 05/31/2019   Pneumococcal Conjugate-13 08/29/2013, 09/16/2018   Pneumococcal Polysaccharide-23 11/08/2014    Past Medical History:  Diagnosis Date   Anemia    iron deficiency   Arthritis    Atrial fibrillation ([facility])    Bilateral renal cysts    worked up by Dr Bernardo Heater   BPH (benign prostatic hypertrophy)    CAD (coronary artery disease)    s/p bypass graft x 4 (1991)   Diverticulosis    GERD (gastroesophageal reflux disease)    GI bleed    Glaucoma    Hypercholesterolemia    Hypertension    Pancreatitis    x2 (1994 and 1998), presumed to be gallstone pancreatitis.  s/p ERCP and sphincterotomy    Tobacco History: Social History   Tobacco Use  Smoking Status Former   Packs/day: 2.00   Years: 25.00   Pack years: 50.00   Types: Cigarettes   Quit date: 01/14/1964   Years  since quitting: 56.6  Smokeless Tobacco Never  Tobacco Comments   smoked 2 packs/day, 1 pipe and 1 cigar once in a while.   Counseling given: Not Answered Tobacco comments: smoked 2 packs/day, 1 pipe and 1 cigar once in a while.   Outpatient Medications Prior to Visit  Medication Sig Dispense Refill   albuterol (VENTOLIN HFA) 108 (90 Base) MCG/ACT inhaler Inhale 2 puffs into the lungs every 6 (six) hours as  needed for wheezing or shortness of breath.     ascorbic acid (VITAMIN C) 250 MG CHEW Chew 250 mg by mouth daily.     atorvastatin (LIPITOR) 20 MG tablet TAKE 1 TABLET (20 MG TOTAL) BY MOUTH DAILY. 90 tablet 3   azelastine (ASTELIN) 0.1 % nasal spray PLACE 1 SPRAY INTO BOTH NOSTRILS 2 (TWO) TIMES DAILY (Patient taking differently: Place 1 spray into both nostrils 2 (two) times daily as needed for rhinitis or allergies.) 30 mL 1   BREO ELLIPTA 200-25 MCG/INH AEPB Inhale 1 puff into the lungs daily. 60 each 2   carbidopa-levodopa (SINEMET CR) 50-200 MG tablet Take 1 tablet by mouth at bedtime.     carbidopa-levodopa (SINEMET IR) 25-100 MG tablet Take 2 tablets by mouth 3 (three) times daily.     Cholecalciferol (VITAMIN D) 50 MCG (2000 UT) tablet Take 2,000 Units by mouth every evening.      citalopram (CELEXA) 20 MG tablet TAKE 1 TABLET (20 MG TOTAL) BY MOUTH DAILY. 90 tablet 2   fluticasone (FLONASE) 50 MCG/ACT nasal spray SPRAY 2 SPRAYS INTO EACH NOSTRIL EVERY DAY (Patient taking differently: Place 2 sprays into both nostrils daily as needed for allergies or rhinitis.) 48 mL 1   furosemide (LASIX) 20 MG tablet Take 20 mg by mouth daily as needed.     guaiFENesin (MUCINEX) 600 MG 12 hr tablet Take 600 mg by mouth daily.     latanoprost (XALATAN) 0.005 % ophthalmic solution Place 1 drop into both eyes at bedtime.      metoprolol tartrate (LOPRESSOR) 25 MG tablet Take 0.5 mg by mouth 2 (two) times daily.     Multiple Vitamin (MULTIVITAMIN WITH MINERALS) TABS tablet Take 1 tablet by mouth every evening.      pantoprazole (PROTONIX) 40 MG tablet TAKE 1 TABLET BY MOUTH EVERY DAY 90 tablet 3   potassium chloride (KLOR-CON) 10 MEQ tablet Take one tablet by mouth with lasix. (Patient taking differently: Take 10 mEq by mouth daily as needed.) 30 tablet 0   rivastigmine (EXELON) 4.6 mg/24hr Place 1 patch onto the skin daily.     tamsulosin (FLOMAX) 0.4 MG CAPS capsule TAKE 1 CAPSULE BY MOUTH EVERY DAY 90  capsule 1   telmisartan (MICARDIS) 20 MG tablet Take 20 mg by mouth daily. (Patient not taking: Reported on 08/07/2020)     No facility-administered medications prior to visit.     Review of Systems:   Constitutional:   No  weight loss, night sweats,  Fevers, chills, + fatigue, or  lassitude.  HEENT:   No headaches,  Difficulty swallowing,  Tooth/dental problems, or  Sore throat,                No sneezing, itching, ear ache, nasal congestion, post nasal drip,   CV:  No chest pain,  Orthopnea, PND, swelling in lower extremities, anasarca, dizziness, palpitations, syncope.   GI  No heartburn, indigestion, abdominal pain, nausea, vomiting, diarrhea, change in bowel habits, loss of appetite, bloody stools.   Resp:  No chest wall deformity  Skin: no rash or lesions.  GU: no dysuria, change in color of urine, no urgency or frequency.  No flank pain, no hematuria   MS:  N General muscle weakness  Physical Exam  BP 120/60 (BP Location: Left Arm, Patient Position: Sitting, Cuff Size: Normal)   Pulse 68   Temp 97.8 F (36.6 C) (Oral)   Ht '5\' 6"'$  (1.676 m)   Wt 132 lb 6.4 oz (60.1 kg)   SpO2 99%   BMI 21.37 kg/m   GEN: A/Ox3; pleasant , NAD, elderly male in wheelchair   HEENT:  [place]/AT,  EACs-clear, TMs-wnl, NOSE-clear, THROAT-clear, no lesions, no postnasal drip or exudate noted.   NECK:  Supple w/ fair ROM; no JVD; normal carotid impulses w/o bruits; no thyromegaly or nodules palpated; no lymphadenopathy.    RESP  Clear  P & A; w/o, wheezes/ rales/ or rhonchi. no accessory muscle use, no dullness to percussion  CARD:  RRR, no m/r/g, tr -1 peripheral edema, pulses intact, no cyanosis or clubbing.  GI:   Soft & nt; nml bowel sounds; no organomegaly or masses detected.   Musco: Warm bil, no deformities or joint swelling noted.   Neuro: alert, no focal deficits noted.    Skin: Warm, no lesions or rashes    Lab Results:  CBC    Component Value Date/Time   WBC 4.5  07/20/2020 1114   RBC 3.12 (L) 07/20/2020 1114   HGB 9.4 (L) 07/20/2020 1114   HGB 14.4 10/13/2011 0614   HCT 28.1 (L) 07/20/2020 1114   HCT 43.3 10/13/2011 0614   PLT 286.0 07/20/2020 1114   PLT 251 10/13/2011 0614   MCV 90.1 07/20/2020 1114   MCV 91 10/13/2011 0614   MCH 31.2 04/06/2020 0531   MCHC 33.4 07/20/2020 1114   RDW 15.8 (H) 07/20/2020 1114   RDW 15.1 (H) 10/13/2011 0614   LYMPHSABS 0.5 (L) 07/20/2020 1114   MONOABS 0.5 07/20/2020 1114   EOSABS 0.0 07/20/2020 1114   BASOSABS 0.0 07/20/2020 1114    BMET    Component Value Date/Time   NA 136 07/11/2020 1246   NA 136 10/13/2011 0614   K 3.9 07/11/2020 1246   K 4.3 10/13/2011 0614   CL 102 07/11/2020 1246   CL 101 10/13/2011 0614   CO2 28 07/11/2020 1246   CO2 26 10/13/2011 0614   GLUCOSE 120 (H) 07/11/2020 1246   GLUCOSE 115 (H) 10/13/2011 0614   BUN 20 07/11/2020 1246   BUN 17 10/13/2011 0614   CREATININE 0.77 07/11/2020 1246   CREATININE 0.92 07/15/2016 1544   CALCIUM 8.4 07/11/2020 1246   CALCIUM 9.2 10/13/2011 0614   GFRNONAA >60 04/05/2020 0501   GFRNONAA >60 10/13/2011 0614   GFRAA >60 07/14/2019 0503   GFRAA >60 10/13/2011 0614    BNP    Component Value Date/Time   BNP 294.0 (H) 01/04/2019 1253    ProBNP No results found for: PROBNP  Imaging: No results found.    No flowsheet data found.  No results found for: NITRICOXIDE      Assessment & Plan:   Chronic bronchitis ([place]) Appears controlled on current regimen with Breo. Check chest x-ray today.  Continue with flutter valve and incentive spirometer. Hold on spirometry or pulmonary function testing at this time. Patient is prone to aspiration with his underlying progressive palsy.  Aspiration precautions discussed in detail.  Plan  Patient Instructions  Chest xray today  Continue on BREO 1 puff  daily , rinse after use.  Activity as tolerated Flutter valve Three times a day   Mucinex liquid As needed  for congestion .   Aspirations precautions as we discussed.  Follow up with Dr Patsey Berthold or Dior Stepter NP in 2 -3 months and As needed       Diastolic dysfunction Appears euvolemic on exam.  Lasix as needed daily. Low-salt diet.     Rexene Edison, NP 08/07/2020

## 2020-08-07 NOTE — Assessment & Plan Note (Signed)
Appears euvolemic on exam.  Lasix as needed daily. Low-salt diet.

## 2020-08-07 NOTE — Assessment & Plan Note (Signed)
Appears controlled on current regimen with Breo. Check chest x-ray today.  Continue with flutter valve and incentive spirometer. Hold on spirometry or pulmonary function testing at this time. Patient is prone to aspiration with his underlying progressive palsy.  Aspiration precautions discussed in detail.  Plan  Patient Instructions  Chest xray today  Continue on BREO 1 puff daily , rinse after use.  Activity as tolerated Flutter valve Three times a day   Mucinex liquid As needed  for congestion .  Aspirations precautions as we discussed.  Follow up with Dr Patsey Berthold or Fe Okubo NP in 2 -3 months and As needed

## 2020-08-10 DIAGNOSIS — Z7951 Long term (current) use of inhaled steroids: Secondary | ICD-10-CM | POA: Diagnosis not present

## 2020-08-10 DIAGNOSIS — G20 Parkinson's disease: Secondary | ICD-10-CM | POA: Diagnosis not present

## 2020-08-10 DIAGNOSIS — K5790 Diverticulosis of intestine, part unspecified, without perforation or abscess without bleeding: Secondary | ICD-10-CM | POA: Diagnosis not present

## 2020-08-10 DIAGNOSIS — Q2733 Arteriovenous malformation of digestive system vessel: Secondary | ICD-10-CM | POA: Diagnosis not present

## 2020-08-10 DIAGNOSIS — E785 Hyperlipidemia, unspecified: Secondary | ICD-10-CM | POA: Diagnosis not present

## 2020-08-10 DIAGNOSIS — Z951 Presence of aortocoronary bypass graft: Secondary | ICD-10-CM | POA: Diagnosis not present

## 2020-08-10 DIAGNOSIS — K922 Gastrointestinal hemorrhage, unspecified: Secondary | ICD-10-CM | POA: Diagnosis not present

## 2020-08-10 DIAGNOSIS — H409 Unspecified glaucoma: Secondary | ICD-10-CM | POA: Diagnosis not present

## 2020-08-10 DIAGNOSIS — I25810 Atherosclerosis of coronary artery bypass graft(s) without angina pectoris: Secondary | ICD-10-CM | POA: Diagnosis not present

## 2020-08-10 DIAGNOSIS — I10 Essential (primary) hypertension: Secondary | ICD-10-CM | POA: Diagnosis not present

## 2020-08-10 DIAGNOSIS — D62 Acute posthemorrhagic anemia: Secondary | ICD-10-CM | POA: Diagnosis not present

## 2020-08-10 DIAGNOSIS — I482 Chronic atrial fibrillation, unspecified: Secondary | ICD-10-CM | POA: Diagnosis not present

## 2020-08-14 NOTE — Progress Notes (Signed)
Agree with the details of the visit as noted by Tammy Parrett, NP.  C. Laura Weyman Bogdon, MD [facility] 

## 2020-08-16 ENCOUNTER — Other Ambulatory Visit: Payer: Self-pay | Admitting: Internal Medicine

## 2020-08-17 DIAGNOSIS — Z951 Presence of aortocoronary bypass graft: Secondary | ICD-10-CM | POA: Diagnosis not present

## 2020-08-17 DIAGNOSIS — I482 Chronic atrial fibrillation, unspecified: Secondary | ICD-10-CM | POA: Diagnosis not present

## 2020-08-17 DIAGNOSIS — D62 Acute posthemorrhagic anemia: Secondary | ICD-10-CM | POA: Diagnosis not present

## 2020-08-17 DIAGNOSIS — H409 Unspecified glaucoma: Secondary | ICD-10-CM | POA: Diagnosis not present

## 2020-08-17 DIAGNOSIS — K5790 Diverticulosis of intestine, part unspecified, without perforation or abscess without bleeding: Secondary | ICD-10-CM | POA: Diagnosis not present

## 2020-08-17 DIAGNOSIS — E785 Hyperlipidemia, unspecified: Secondary | ICD-10-CM | POA: Diagnosis not present

## 2020-08-17 DIAGNOSIS — G20 Parkinson's disease: Secondary | ICD-10-CM | POA: Diagnosis not present

## 2020-08-17 DIAGNOSIS — I25810 Atherosclerosis of coronary artery bypass graft(s) without angina pectoris: Secondary | ICD-10-CM | POA: Diagnosis not present

## 2020-08-17 DIAGNOSIS — Q2733 Arteriovenous malformation of digestive system vessel: Secondary | ICD-10-CM | POA: Diagnosis not present

## 2020-08-17 DIAGNOSIS — Z7951 Long term (current) use of inhaled steroids: Secondary | ICD-10-CM | POA: Diagnosis not present

## 2020-08-17 DIAGNOSIS — K922 Gastrointestinal hemorrhage, unspecified: Secondary | ICD-10-CM | POA: Diagnosis not present

## 2020-08-17 DIAGNOSIS — I10 Essential (primary) hypertension: Secondary | ICD-10-CM | POA: Diagnosis not present

## 2020-08-20 ENCOUNTER — Other Ambulatory Visit (INDEPENDENT_AMBULATORY_CARE_PROVIDER_SITE_OTHER): Payer: Medicare HMO

## 2020-08-20 ENCOUNTER — Other Ambulatory Visit: Payer: Self-pay

## 2020-08-20 DIAGNOSIS — D649 Anemia, unspecified: Secondary | ICD-10-CM

## 2020-08-20 LAB — CBC WITH DIFFERENTIAL/PLATELET
Basophils Absolute: 0 10*3/uL (ref 0.0–0.1)
Basophils Relative: 0.6 % (ref 0.0–3.0)
Eosinophils Absolute: 0 10*3/uL (ref 0.0–0.7)
Eosinophils Relative: 0.7 % (ref 0.0–5.0)
HCT: 31.2 % — ABNORMAL LOW (ref 39.0–52.0)
Hemoglobin: 10.2 g/dL — ABNORMAL LOW (ref 13.0–17.0)
Lymphocytes Relative: 12.5 % (ref 12.0–46.0)
Lymphs Abs: 0.7 10*3/uL (ref 0.7–4.0)
MCHC: 32.7 g/dL (ref 30.0–36.0)
MCV: 90.1 fl (ref 78.0–100.0)
Monocytes Absolute: 0.6 10*3/uL (ref 0.1–1.0)
Monocytes Relative: 11.6 % (ref 3.0–12.0)
Neutro Abs: 4.2 10*3/uL (ref 1.4–7.7)
Neutrophils Relative %: 74.6 % (ref 43.0–77.0)
Platelets: 258 10*3/uL (ref 150.0–400.0)
RBC: 3.46 Mil/uL — ABNORMAL LOW (ref 4.22–5.81)
RDW: 16.7 % — ABNORMAL HIGH (ref 11.5–15.5)
WBC: 5.6 10*3/uL (ref 4.0–10.5)

## 2020-08-20 LAB — IBC + FERRITIN
Ferritin: 28.7 ng/mL (ref 22.0–322.0)
Iron: 51 ug/dL (ref 42–165)
Saturation Ratios: 13.7 % — ABNORMAL LOW (ref 20.0–50.0)
Transferrin: 266 mg/dL (ref 212.0–360.0)

## 2020-08-30 DIAGNOSIS — D2262 Melanocytic nevi of left upper limb, including shoulder: Secondary | ICD-10-CM | POA: Diagnosis not present

## 2020-08-30 DIAGNOSIS — X32XXXA Exposure to sunlight, initial encounter: Secondary | ICD-10-CM | POA: Diagnosis not present

## 2020-08-30 DIAGNOSIS — C44722 Squamous cell carcinoma of skin of right lower limb, including hip: Secondary | ICD-10-CM | POA: Diagnosis not present

## 2020-08-30 DIAGNOSIS — Z85828 Personal history of other malignant neoplasm of skin: Secondary | ICD-10-CM | POA: Diagnosis not present

## 2020-08-30 DIAGNOSIS — D2261 Melanocytic nevi of right upper limb, including shoulder: Secondary | ICD-10-CM | POA: Diagnosis not present

## 2020-08-30 DIAGNOSIS — L570 Actinic keratosis: Secondary | ICD-10-CM | POA: Diagnosis not present

## 2020-08-30 DIAGNOSIS — D225 Melanocytic nevi of trunk: Secondary | ICD-10-CM | POA: Diagnosis not present

## 2020-08-30 DIAGNOSIS — D485 Neoplasm of uncertain behavior of skin: Secondary | ICD-10-CM | POA: Diagnosis not present

## 2020-09-02 DIAGNOSIS — G4733 Obstructive sleep apnea (adult) (pediatric): Secondary | ICD-10-CM | POA: Diagnosis not present

## 2020-09-03 NOTE — Progress Notes (Signed)
ATC x1. No answer, left vm for a return call.

## 2020-09-07 NOTE — Progress Notes (Signed)
Called and spoke with patient's daughter, Erik Obey [facility]), provided results/recommendations per Rexene Edison NP.  She wanted to schedule his f/u for November as the schedule was not out in July when he was last seen.  He needed an afternoon appointment, scheduled for Monday November 14th at 2 pm, advised to arrive by 1:45 pm for check out.  Advised that he will need to see Dr. Patsey Berthold to establish care on the next visit.  She verbalized understanding.  Nothing further needed.

## 2020-09-12 DIAGNOSIS — H02055 Trichiasis without entropian left lower eyelid: Secondary | ICD-10-CM | POA: Diagnosis not present

## 2020-09-12 DIAGNOSIS — H401132 Primary open-angle glaucoma, bilateral, moderate stage: Secondary | ICD-10-CM | POA: Diagnosis not present

## 2020-09-18 DIAGNOSIS — I429 Cardiomyopathy, unspecified: Secondary | ICD-10-CM | POA: Diagnosis not present

## 2020-09-18 DIAGNOSIS — I2510 Atherosclerotic heart disease of native coronary artery without angina pectoris: Secondary | ICD-10-CM | POA: Diagnosis not present

## 2020-09-18 DIAGNOSIS — R002 Palpitations: Secondary | ICD-10-CM | POA: Diagnosis not present

## 2020-09-18 DIAGNOSIS — I10 Essential (primary) hypertension: Secondary | ICD-10-CM | POA: Diagnosis not present

## 2020-09-18 DIAGNOSIS — I480 Paroxysmal atrial fibrillation: Secondary | ICD-10-CM | POA: Diagnosis not present

## 2020-09-18 DIAGNOSIS — R030 Elevated blood-pressure reading, without diagnosis of hypertension: Secondary | ICD-10-CM | POA: Diagnosis not present

## 2020-09-20 DIAGNOSIS — G231 Progressive supranuclear ophthalmoplegia [Steele-Richardson-Olszewski]: Secondary | ICD-10-CM | POA: Diagnosis not present

## 2020-09-20 DIAGNOSIS — Z9189 Other specified personal risk factors, not elsewhere classified: Secondary | ICD-10-CM | POA: Diagnosis not present

## 2020-09-20 DIAGNOSIS — R2689 Other abnormalities of gait and mobility: Secondary | ICD-10-CM | POA: Diagnosis not present

## 2020-09-25 ENCOUNTER — Ambulatory Visit (INDEPENDENT_AMBULATORY_CARE_PROVIDER_SITE_OTHER): Payer: Medicare HMO | Admitting: Internal Medicine

## 2020-09-25 ENCOUNTER — Other Ambulatory Visit: Payer: Self-pay

## 2020-09-25 VITALS — BP 108/60 | HR 60 | Temp 97.8°F | Resp 16 | Ht 66.0 in | Wt 128.0 lb

## 2020-09-25 DIAGNOSIS — E7800 Pure hypercholesterolemia, unspecified: Secondary | ICD-10-CM

## 2020-09-25 DIAGNOSIS — I700 Atherosclerosis of aorta: Secondary | ICD-10-CM | POA: Diagnosis not present

## 2020-09-25 DIAGNOSIS — D508 Other iron deficiency anemias: Secondary | ICD-10-CM

## 2020-09-25 DIAGNOSIS — I482 Chronic atrial fibrillation, unspecified: Secondary | ICD-10-CM

## 2020-09-25 DIAGNOSIS — G231 Progressive supranuclear ophthalmoplegia [Steele-Richardson-Olszewski]: Secondary | ICD-10-CM

## 2020-09-25 DIAGNOSIS — I429 Cardiomyopathy, unspecified: Secondary | ICD-10-CM | POA: Diagnosis not present

## 2020-09-25 DIAGNOSIS — E871 Hypo-osmolality and hyponatremia: Secondary | ICD-10-CM

## 2020-09-25 DIAGNOSIS — R600 Localized edema: Secondary | ICD-10-CM | POA: Diagnosis not present

## 2020-09-25 DIAGNOSIS — J411 Mucopurulent chronic bronchitis: Secondary | ICD-10-CM | POA: Diagnosis not present

## 2020-09-25 DIAGNOSIS — Z8719 Personal history of other diseases of the digestive system: Secondary | ICD-10-CM

## 2020-09-25 DIAGNOSIS — I2510 Atherosclerotic heart disease of native coronary artery without angina pectoris: Secondary | ICD-10-CM | POA: Diagnosis not present

## 2020-09-25 DIAGNOSIS — I10 Essential (primary) hypertension: Secondary | ICD-10-CM

## 2020-09-25 DIAGNOSIS — R634 Abnormal weight loss: Secondary | ICD-10-CM

## 2020-09-25 NOTE — Progress Notes (Signed)
[name]: Tyler Stout, male   DOB: 1925/04/05, 87 y.o.   MRN: [id number]   Subjective:    [name]: Tyler Stout, male    DOB: 05-10-1925, 87 y.o.   MRN: [id number]  This visit occurred during the SARS-CoV-2 public health emergency.  Safety protocols were in place, including screening questions prior to the visit, additional usage of staff PPE, and extensive cleaning of exam room while observing appropriate contact time as indicated for disinfecting solutions.   Patient here for scheduled follow up.   Chief Complaint  Patient presents with   Hypertension   Gastroesophageal Reflux   Anemia   .   HPI Here to follow up regarding high blood pressure and anemia.  He is accompanied by his daughter.  History obtained from both of them.  Recently evaluated by neurology for suspected PSP.  Continues sinemet and rivastigmine.  Has had issues with GI bleeding. On no anticoagulation now.  Is eating.  Weight is down.  Saw cardiology 09/18/20 - stable.  Continues breo.  Breathing overall appears to be stable.  Mucinex liquid for congestion.     Past Medical History:  Diagnosis Date   Anemia    iron deficiency   Arthritis    Atrial fibrillation ([place])    Bilateral renal cysts    worked up by Dr Bernardo Heater   BPH (benign prostatic hypertrophy)    CAD (coronary artery disease)    s/p bypass graft x 4 (1991)   Diverticulosis    GERD (gastroesophageal reflux disease)    GI bleed    Glaucoma    Hypercholesterolemia    Hypertension    Pancreatitis    x2 (1994 and 1998), presumed to be gallstone pancreatitis.  s/p ERCP and sphincterotomy   Past Surgical History:  Procedure Laterality Date   APPENDECTOMY     CHOLECYSTECTOMY  1994   COLONOSCOPY N/A 07/14/2019   Procedure: COLONOSCOPY;  Surgeon: Virgel Manifold, MD;  Location: [facility];  Service: Endoscopy;  Laterality: N/A;   CORONARY ARTERY BYPASS GRAFT     x 4,  1991   EMBOLIZATION Right 05/25/2019   Procedure: EMBOLIZATION  (Right Colon);  Surgeon: Katha Cabal, MD;  Location: [facility];  Service: Cardiovascular;  Laterality: Right;   INGUINAL HERNIA REPAIR  2009   open heart surgery  1991   Family History  Problem Relation Age of Onset   Throat cancer Father        oropharyngeal cancer   Kidney disease Sister    Heart disease Other    Colon cancer Neg Hx    Prostate cancer Neg Hx    Social History   Socioeconomic History   Marital status: Married    Spouse name: Not on file   Number of children: Not on file   Years of education: Not on file   Highest education level: Not on file  Occupational History   Not on file  Tobacco Use   Smoking status: Former    Packs/day: 2.00    Years: 25.00    Pack years: 50.00    Types: Cigarettes    Quit date: 01/14/1964    Years since quitting: 56.7   Smokeless tobacco: Never   Tobacco comments:    smoked 2 packs/day, 1 pipe and 1 cigar once in a while.  Substance and Sexual Activity   Alcohol use: No    Alcohol/week: 0.0 standard drinks   Drug use: No   Sexual activity: Not  on file  Other Topics Concern   Not on file  Social History Narrative   Not on file   Social Determinants of Health   Financial Resource Strain: Low Risk    Difficulty of Paying Living Expenses: Not hard at all  Food Insecurity: No Food Insecurity   Worried About Charity fundraiser in the Last Year: Never true   [place] in the Last Year: Never true  Transportation Needs: No Transportation Needs   Lack of Transportation (Medical): No   Lack of Transportation (Non-Medical): No  Physical Activity: Insufficiently Active   Days of Exercise per Week: 1 day   Minutes of Exercise per Session: 60 min  Stress: No Stress Concern Present   Feeling of Stress : Not at all  Social Connections: Unknown   Frequency of Communication with Friends and Family: Not on file   Frequency of Social Gatherings with Friends and Family: More than three times a week   Attends  Religious Services: Not on Electrical engineer or Organizations: Not on file   Attends Archivist Meetings: Not on file   Marital Status: Married    Review of Systems  Constitutional:  Negative for appetite change.       Weight is down.  Daughter reports he is eating.   HENT:  Negative for sinus pressure.   Respiratory:  Negative for chest tightness.        Breathing/cough - stable.   Cardiovascular:  Negative for chest pain, palpitations and leg swelling.  Gastrointestinal:  Negative for abdominal pain, diarrhea, nausea and vomiting.  Genitourinary:  Negative for difficulty urinating and dysuria.  Musculoskeletal:  Negative for joint swelling and myalgias.  Skin:  Negative for color change and rash.  Neurological:  Negative for dizziness and headaches.       No increased dizziness.    Psychiatric/Behavioral:  Negative for agitation and dysphoric mood.       Objective:     BP 108/60   Pulse 60   Temp 97.8 F (36.6 C)   Resp 16   Ht '5\' 6"'$  (1.676 m)   Wt 128 lb (58.1 kg)   SpO2 99%   BMI 20.66 kg/m  Wt Readings from Last 3 Encounters:  09/25/20 128 lb (58.1 kg)  08/07/20 132 lb 6.4 oz (60.1 kg)  07/18/20 134 lb 9.6 oz (61.1 kg)    Physical Exam Constitutional:      General: He is not in acute distress.    Appearance: Normal appearance. He is well-developed.  HENT:     Head: Normocephalic and atraumatic.     Right Ear: External ear normal.     Left Ear: External ear normal.  Eyes:     General: No scleral icterus.       Right eye: No discharge.        Left eye: No discharge.  Cardiovascular:     Rate and Rhythm: Normal rate.     Comments: Rate controlled.   Pulmonary:     Effort: Pulmonary effort is normal. No respiratory distress.     Breath sounds: Normal breath sounds.  Abdominal:     General: Bowel sounds are normal.     Palpations: Abdomen is soft.     Tenderness: There is no abdominal tenderness.  Musculoskeletal:        General:  No swelling or tenderness.     Cervical back: Neck supple. No tenderness.  Lymphadenopathy:  Cervical: No cervical adenopathy.  Skin:    Findings: No erythema or rash.  Neurological:     Mental Status: He is alert.  Psychiatric:        Mood and Affect: Mood normal.        Behavior: Behavior normal.     Outpatient Encounter Medications as of 09/25/2020  Medication Sig   albuterol (VENTOLIN HFA) 108 (90 Base) MCG/ACT inhaler Inhale 2 puffs into the lungs every 6 (six) hours as needed for wheezing or shortness of breath.   ascorbic acid (VITAMIN C) 250 MG CHEW Chew 250 mg by mouth daily.   atorvastatin (LIPITOR) 20 MG tablet TAKE 1 TABLET (20 MG TOTAL) BY MOUTH DAILY.   azelastine (ASTELIN) 0.1 % nasal spray PLACE 1 SPRAY INTO BOTH NOSTRILS 2 (TWO) TIMES DAILY (Patient taking differently: Place 1 spray into both nostrils 2 (two) times daily as needed for rhinitis or allergies.)   BREO ELLIPTA 200-25 MCG/INH AEPB TAKE 1 PUFF BY MOUTH EVERY DAY   carbidopa-levodopa (SINEMET CR) 50-200 MG tablet Take 1 tablet by mouth at bedtime.   carbidopa-levodopa (SINEMET IR) 25-100 MG tablet Take 2 tablets by mouth 3 (three) times daily.   Cholecalciferol (VITAMIN D) 50 MCG (2000 UT) tablet Take 2,000 Units by mouth every evening.    citalopram (CELEXA) 20 MG tablet TAKE 1 TABLET (20 MG TOTAL) BY MOUTH DAILY.   fluticasone (FLONASE) 50 MCG/ACT nasal spray SPRAY 2 SPRAYS INTO EACH NOSTRIL EVERY DAY (Patient taking differently: Place 2 sprays into both nostrils daily as needed for allergies or rhinitis.)   furosemide (LASIX) 20 MG tablet Take 20 mg by mouth daily as needed.   guaiFENesin (MUCINEX) 600 MG 12 hr tablet Take 600 mg by mouth daily.   latanoprost (XALATAN) 0.005 % ophthalmic solution Place 1 drop into both eyes at bedtime.    metoprolol tartrate (LOPRESSOR) 25 MG tablet Take 0.5 mg by mouth 2 (two) times daily.   Multiple Vitamin (MULTIVITAMIN WITH MINERALS) TABS tablet Take 1 tablet by mouth  every evening.    pantoprazole (PROTONIX) 40 MG tablet TAKE 1 TABLET BY MOUTH EVERY DAY   potassium chloride (KLOR-CON) 10 MEQ tablet Take one tablet by mouth with lasix. (Patient taking differently: Take 10 mEq by mouth daily as needed.)   rivastigmine (EXELON) 4.6 mg/24hr Place 1 patch onto the skin daily.   tamsulosin (FLOMAX) 0.4 MG CAPS capsule TAKE 1 CAPSULE BY MOUTH EVERY DAY   [DISCONTINUED] telmisartan (MICARDIS) 20 MG tablet Take 20 mg by mouth daily. (Patient not taking: Reported on 08/07/2020)   No facility-administered encounter medications on file as of 09/25/2020.     Lab Results  Component Value Date   WBC 4.2 09/25/2020   HGB 10.4 (L) 09/25/2020   HCT 31.9 (L) 09/25/2020   PLT 192.0 09/25/2020   GLUCOSE 109 (H) 09/25/2020   CHOL 107 04/10/2020   TRIG 74.0 04/10/2020   HDL 49.50 04/10/2020   LDLCALC 43 04/10/2020   ALT 3 09/25/2020   AST 11 09/25/2020   NA 137 09/25/2020   K 4.2 09/25/2020   CL 102 09/25/2020   CREATININE 0.79 09/25/2020   BUN 16 09/25/2020   CO2 29 09/25/2020   TSH 1.94 04/10/2020   INR 1.1 04/04/2020   HGBA1C 5.7 04/10/2020    DG Chest 2 View  Result Date: 08/08/2020 CLINICAL DATA:  87 year old male with a history of chronic bronchitis EXAM: CHEST - 2 VIEW COMPARISON:  06/12/2020 FINDINGS: Cardiomediastinal silhouette unchanged in size  and contour. No evidence of central vascular congestion. No interlobular septal thickening. Surgical changes of median sternotomy and CABG Similar appearance of low lung volumes with flattened hemidiaphragms. Left diaphragmatic pleural calcification. No pneumothorax or pleural effusion. Coarsened interstitial markings, with no confluent airspace disease. No acute displaced fracture. Degenerative changes of the spine. IMPRESSION: No acute cardiopulmonary disease. Background chronic changes, as well as redemonstration of left pleural plaques. Surgical changes of median sternotomy and CABG. Electronically Signed   By:  Corrie Mckusick D.O.   On: 08/08/2020 13:44       Assessment & Plan:   Problem List Items Addressed This Visit     Anemia    No further bleeding.  Check cbc and iron.        Relevant Orders   CBC with Differential/Platelet (Completed)   Ferritin (Completed)   CAD (coronary artery disease)    Followed by cardiology.  Stable.       Cardiomyopathy [facility])    He recently underwent an echocardiogram which revealed ejection fraction of 45-50% with mild AI MR and TR with mild to moderate pulmonary hypertension.  Daughter is weighing him regularly.  Lasix prn.  Breathing stable.  Follow.        Chronic atrial fibrillation (H[facility])    Off coumadin secondary to GI bleeds.  On metoprolol.  Off aspirin secondary to recent GI bleed.  Follow. Stable.       Chronic bronchitis ([place])    Continue breo.  Discussed using flutter valve.  Has not been using.  Breathing overall stable.       History of GI diverticular bleed    No further bleeding since last hospitalization.  Follow.  Recheck cbc and iron.       Hypercholesteremia    Continue lipitor.  Follow lipid panel and liver function tests.        Relevant Orders   Hepatic function panel (Completed)   Hypertension - Primary    Continue metoprolol.  Continue to monitor pressures.  Follow metabolic panel.       Relevant Orders   Basic metabolic panel (Completed)   Hyponatremia    Follow sodium to confirm stable.       Loss of weight    Weight down from last check.  Daughter reports he is eating.  Follow.        Lower extremity edema    Lasix prn.  No increased swelling today.  Follow.       Progressive supranuclear palsy ([place])    Followed by neurology.  On sinemet.       Thoracic aortic atherosclerosis (HCC)    Continue lipitor.         [name], MD

## 2020-09-26 LAB — BASIC METABOLIC PANEL
BUN: 16 mg/dL (ref 6–23)
CO2: 29 mEq/L (ref 19–32)
Calcium: 8.6 mg/dL (ref 8.4–10.5)
Chloride: 102 mEq/L (ref 96–112)
Creatinine, Ser: 0.79 mg/dL (ref 0.40–1.50)
GFR: 76.73 mL/min (ref >60.00)
Glucose, Bld: 109 mg/dL — ABNORMAL HIGH (ref 70–99)
Potassium: 4.2 mEq/L (ref 3.5–5.1)
Sodium: 137 mEq/L (ref 135–145)

## 2020-09-26 LAB — HEPATIC FUNCTION PANEL
ALT: 3 U/L (ref 0–53)
AST: 11 U/L (ref 0–37)
Albumin: 3.7 g/dL (ref 3.5–5.2)
Alkaline Phosphatase: 77 U/L (ref 39–117)
Bilirubin, Direct: 0.2 mg/dL (ref 0.0–0.3)
Total Bilirubin: 0.6 mg/dL (ref 0.2–1.2)
Total Protein: 5.8 g/dL — ABNORMAL LOW (ref 6.0–8.3)

## 2020-09-26 LAB — CBC WITH DIFFERENTIAL/PLATELET
Basophils Absolute: 0 10*3/uL (ref 0.0–0.1)
Basophils Relative: 0.6 % (ref 0.0–3.0)
Eosinophils Absolute: 0 10*3/uL (ref 0.0–0.7)
Eosinophils Relative: 0.8 % (ref 0.0–5.0)
HCT: 31.9 % — ABNORMAL LOW (ref 39.0–52.0)
Hemoglobin: 10.4 g/dL — ABNORMAL LOW (ref 13.0–17.0)
Lymphocytes Relative: 15.1 % (ref 12.0–46.0)
Lymphs Abs: 0.6 10*3/uL — ABNORMAL LOW (ref 0.7–4.0)
MCHC: 32.5 g/dL (ref 30.0–36.0)
MCV: 90.9 fl (ref 78.0–100.0)
Monocytes Absolute: 0.5 10*3/uL (ref 0.1–1.0)
Monocytes Relative: 12.5 % — ABNORMAL HIGH (ref 3.0–12.0)
Neutro Abs: 3 10*3/uL (ref 1.4–7.7)
Neutrophils Relative %: 71 % (ref 43.0–77.0)
Platelets: 192 10*3/uL (ref 150.0–400.0)
RBC: 3.51 Mil/uL — ABNORMAL LOW (ref 4.22–5.81)
RDW: 17.7 % — ABNORMAL HIGH (ref 11.5–15.5)
WBC: 4.2 10*3/uL (ref 4.0–10.5)

## 2020-09-26 LAB — FERRITIN: Ferritin: 17.8 ng/mL — ABNORMAL LOW (ref 22.0–322.0)

## 2020-09-27 ENCOUNTER — Telehealth: Payer: Self-pay

## 2020-09-27 DIAGNOSIS — H2000 Unspecified acute and subacute iridocyclitis: Secondary | ICD-10-CM | POA: Diagnosis not present

## 2020-09-27 MED ORDER — IRON (FERROUS SULFATE) 325 (65 FE) MG PO TABS: 325.0000 mg | ORAL_TABLET | Freq: Every day | ORAL | 2 refills | Status: AC

## 2020-09-27 NOTE — Telephone Encounter (Signed)
Sent in ferrous sulfate 325 mg to pt pharmacy

## 2020-09-30 ENCOUNTER — Encounter: Payer: Self-pay | Admitting: Internal Medicine

## 2020-09-30 NOTE — Assessment & Plan Note (Signed)
Follow sodium to confirm stable.  

## 2020-09-30 NOTE — Assessment & Plan Note (Signed)
Lasix prn.  No increased swelling today.  Follow.

## 2020-09-30 NOTE — Assessment & Plan Note (Addendum)
Continue metoprolol.  Continue to monitor pressures.  Follow metabolic panel.

## 2020-09-30 NOTE — Assessment & Plan Note (Signed)
Continue lipitor  ?

## 2020-09-30 NOTE — Assessment & Plan Note (Signed)
No further bleeding.  Check cbc and iron.

## 2020-09-30 NOTE — Assessment & Plan Note (Signed)
Off coumadin secondary to GI bleeds.  On metoprolol.  Off aspirin secondary to recent GI bleed.  Follow. Stable.

## 2020-09-30 NOTE — Assessment & Plan Note (Signed)
Weight down from last check.  Daughter reports he is eating.  Follow.

## 2020-09-30 NOTE — Assessment & Plan Note (Signed)
No further bleeding since last hospitalization.  Follow.  Recheck cbc and iron.

## 2020-09-30 NOTE — Assessment & Plan Note (Signed)
Continue lipitor.  Follow lipid panel and liver function tests.   

## 2020-09-30 NOTE — Assessment & Plan Note (Signed)
Followed by cardiology. Stable.   

## 2020-09-30 NOTE — Assessment & Plan Note (Signed)
Continue breo.  Discussed using flutter valve.  Has not been using.  Breathing overall stable.

## 2020-09-30 NOTE — Assessment & Plan Note (Signed)
He recently underwent an echocardiogram which revealed ejection fraction of 45-50% with mild AI MR and TR with mild to moderate pulmonary hypertension.  Daughter is weighing him regularly.  Lasix prn.  Breathing stable.  Follow.

## 2020-09-30 NOTE — Assessment & Plan Note (Signed)
Followed by neurology.  On sinemet.   

## 2020-10-03 DIAGNOSIS — G4733 Obstructive sleep apnea (adult) (pediatric): Secondary | ICD-10-CM | POA: Diagnosis not present

## 2020-10-04 DIAGNOSIS — H2000 Unspecified acute and subacute iridocyclitis: Secondary | ICD-10-CM | POA: Diagnosis not present

## 2020-10-28 ENCOUNTER — Other Ambulatory Visit: Payer: Self-pay | Admitting: Internal Medicine

## 2020-11-01 DIAGNOSIS — H2000 Unspecified acute and subacute iridocyclitis: Secondary | ICD-10-CM | POA: Diagnosis not present

## 2020-11-02 DIAGNOSIS — G4733 Obstructive sleep apnea (adult) (pediatric): Secondary | ICD-10-CM | POA: Diagnosis not present

## 2020-11-05 ENCOUNTER — Telehealth: Payer: Self-pay | Admitting: *Deleted

## 2020-11-05 DIAGNOSIS — D649 Anemia, unspecified: Secondary | ICD-10-CM

## 2020-11-05 NOTE — Telephone Encounter (Signed)
Please place future orders for lab appt.  

## 2020-11-05 NOTE — Telephone Encounter (Signed)
Orders placed.

## 2020-11-07 ENCOUNTER — Other Ambulatory Visit: Payer: Self-pay

## 2020-11-07 ENCOUNTER — Other Ambulatory Visit (INDEPENDENT_AMBULATORY_CARE_PROVIDER_SITE_OTHER): Payer: Medicare HMO

## 2020-11-07 DIAGNOSIS — D649 Anemia, unspecified: Secondary | ICD-10-CM | POA: Diagnosis not present

## 2020-11-07 LAB — CBC WITH DIFFERENTIAL/PLATELET
Basophils Absolute: 0 10*3/uL (ref 0.0–0.1)
Basophils Relative: 0.8 % (ref 0.0–3.0)
Eosinophils Absolute: 0 10*3/uL (ref 0.0–0.7)
Eosinophils Relative: 0.9 % (ref 0.0–5.0)
HCT: 35 % — ABNORMAL LOW (ref 39.0–52.0)
Hemoglobin: 11.3 g/dL — ABNORMAL LOW (ref 13.0–17.0)
Lymphocytes Relative: 16 % (ref 12.0–46.0)
Lymphs Abs: 0.7 10*3/uL (ref 0.7–4.0)
MCHC: 32.3 g/dL (ref 30.0–36.0)
MCV: 91.2 fl (ref 78.0–100.0)
Monocytes Absolute: 0.6 10*3/uL (ref 0.1–1.0)
Monocytes Relative: 14.3 % — ABNORMAL HIGH (ref 3.0–12.0)
Neutro Abs: 2.8 10*3/uL (ref 1.4–7.7)
Neutrophils Relative %: 68 % (ref 43.0–77.0)
Platelets: 181 10*3/uL (ref 150.0–400.0)
RBC: 3.84 Mil/uL — ABNORMAL LOW (ref 4.22–5.81)
RDW: 17.8 % — ABNORMAL HIGH (ref 11.5–15.5)
WBC: 4.1 10*3/uL (ref 4.0–10.5)

## 2020-11-07 LAB — FERRITIN: Ferritin: 20.4 ng/mL — ABNORMAL LOW (ref 22.0–322.0)

## 2020-11-09 ENCOUNTER — Telehealth: Payer: Self-pay

## 2020-11-09 NOTE — Telephone Encounter (Signed)
Patients daughter Tyler Stout is returning your call.Please call her at [phone].

## 2020-11-09 NOTE — Telephone Encounter (Signed)
-----   Message from [name], MD sent at 11/08/2020  4:50 AM EDT ----- Notify - hgb and iron stores have improved.  Continue iron as he is doing.  We will continue to follow.

## 2020-11-13 ENCOUNTER — Telehealth: Payer: Self-pay

## 2020-11-13 NOTE — Telephone Encounter (Signed)
-----   Message from [name], MD sent at 11/10/2020 11:21 AM EDT ----- Reviewed result note.  Apparently not taking any iron supplement now.  Per 09/25/20 lab result note, was supposed to start ferrous sulfate.  Has he been on it and just not taking now, or did he never start?   If he did not start the iron and prefers to remain off, given hgb and iron improving, we will follow.

## 2020-11-26 ENCOUNTER — Encounter: Payer: Self-pay | Admitting: Adult Health

## 2020-11-26 ENCOUNTER — Ambulatory Visit (INDEPENDENT_AMBULATORY_CARE_PROVIDER_SITE_OTHER): Payer: Medicare HMO | Admitting: Adult Health

## 2020-11-26 ENCOUNTER — Other Ambulatory Visit: Payer: Self-pay

## 2020-11-26 DIAGNOSIS — I5189 Other ill-defined heart diseases: Secondary | ICD-10-CM | POA: Diagnosis not present

## 2020-11-26 DIAGNOSIS — J42 Unspecified chronic bronchitis: Secondary | ICD-10-CM | POA: Diagnosis not present

## 2020-11-26 MED ORDER — FLUTICASONE FUROATE-VILANTEROL 200-25 MCG/ACT IN AEPB: 1.0000 | INHALATION_SPRAY | Freq: Every day | RESPIRATORY_TRACT | 6 refills | Status: AC

## 2020-11-26 MED ORDER — FLUTICASONE FUROATE-VILANTEROL 200-25 MCG/ACT IN AEPB: 1.0000 | INHALATION_SPRAY | Freq: Every day | RESPIRATORY_TRACT | 0 refills | Status: AC

## 2020-11-26 NOTE — Progress Notes (Signed)
@Patient  ID: Tyler Stout, male    DOB: 03-Jun-1925, 87 y.o.   MRN: [id number]  Chief Complaint  Patient presents with   Follow-up    Referring provider: [name], MD  HPI: 87 year old male former smoker with a known history of progressive supranuclear palsy seen for pulmonary consult January 2020 for chronic cough and shortness of breath felt to have chronic bronchitis Patient is former Nature conservation officer served in the [employer] history significant for cardiomyopathy, A. fib, coronary artery disease and hyperlipidemia, OSA on CPAP (followed by cardiology )   TEST/EVENTS :  Chest x-ray June 13, 2020 showed mild bilateral interstitial prominence possible interstitial edema.  2D echo in July 2020 showed EF at 60 to 65% normal right ventricular systolic function.  An elevated pulmonary artery pressure at 39.2 mmHg.  Mild to moderate tricuspid valve regurg.  swallow evaluation August 2020.  Felt to have mild dysphagia and aspiration risk.  Was recommended for a dysphagia 3 diet.  11/26/2020 Follow up :Chronic Bronchitis  Patient returns for a 82-month follow-up.  Patient is followed for chronic bronchitis.  Patient says overall breathing has been doing okay.  He denies any increased cough.  He does have mild dysphagia and is at aspiration risk.  He has been recommended to have a dysphagia 3 diet.  Denies any increased choking or swallowing issues. Only issues is with pills, sometimes hard at times.  Previous chest x-ray has showed chronic increased interstitial markings and left pleural plaque. Overall activity level is sedentary . Walks with walker. Lives at home with wife. She is in good health. Family close by and helps. Does not drive.   Influenza is up to day . COVID vaccine new booster due.  Remains on CPAP At bedtime  .  Good appetite .  Not using flutter valve on regular basis .   Allergies  Allergen Reactions   Vitamin K2 Anaphylaxis   Ilosone [Erythromycin] Other (See  Comments)    Reaction:  GI upset    Penicillins Rash and Other (See Comments)    Has patient had a PCN reaction causing immediate rash, facial/tongue/throat swelling, SOB or lightheadedness with hypotension: No Has patient had a PCN reaction causing severe rash involving mucus membranes or skin necrosis: No Has patient had a PCN reaction that required hospitalization No Has patient had a PCN reaction occurring within the last 10 years: No If all of the above answers are "NO", then may proceed with Cephalosporin use.    Immunization History  Administered Date(s) Administered   Influenza Split 10/07/2012, 09/16/2018   Influenza, High Dose Seasonal PF 09/22/2016, 09/28/2017, 09/16/2018, 10/26/2020   Influenza,inj,Quad PF,6+ Mos 11/08/2014   Influenza-Unspecified 10/09/2015, 10/12/2019   PFIZER(Purple Top)SARS-COV-2 Vaccination 05/05/2019, 05/31/2019   Pneumococcal Conjugate-13 08/29/2013, 09/16/2018   Pneumococcal Polysaccharide-23 11/08/2014    Past Medical History:  Diagnosis Date   Anemia    iron deficiency   Arthritis    Atrial fibrillation ([place])    Bilateral renal cysts    worked up by Dr Bernardo Heater   BPH (benign prostatic hypertrophy)    CAD (coronary artery disease)    s/p bypass graft x 4 (1991)   Diverticulosis    GERD (gastroesophageal reflux disease)    GI bleed    Glaucoma    Hypercholesterolemia    Hypertension    Pancreatitis    x2 (1994 and 1998), presumed to be gallstone pancreatitis.  s/p ERCP and sphincterotomy    Tobacco History: Social History   Tobacco  Use  Smoking Status Former   Packs/day: 2.00   Years: 25.00   Pack years: 50.00   Types: Cigarettes   Quit date: 01/14/1964   Years since quitting: 56.9  Smokeless Tobacco Never  Tobacco Comments   smoked 2 packs/day, 1 pipe and 1 cigar once in a while.   Counseling given: Not Answered Tobacco comments: smoked 2 packs/day, 1 pipe and 1 cigar once in a while.   Outpatient Medications Prior to  Visit  Medication Sig Dispense Refill   albuterol (VENTOLIN HFA) 108 (90 Base) MCG/ACT inhaler Inhale 2 puffs into the lungs every 6 (six) hours as needed for wheezing or shortness of breath.     ascorbic acid (VITAMIN C) 250 MG CHEW Chew 250 mg by mouth daily.     atorvastatin (LIPITOR) 20 MG tablet TAKE 1 TABLET (20 MG TOTAL) BY MOUTH DAILY. 90 tablet 3   azelastine (ASTELIN) 0.1 % nasal spray PLACE 1 SPRAY INTO BOTH NOSTRILS 2 (TWO) TIMES DAILY (Patient taking differently: Place 1 spray into both nostrils 2 (two) times daily as needed for rhinitis or allergies.) 30 mL 1   BREO ELLIPTA 200-25 MCG/INH AEPB TAKE 1 PUFF BY MOUTH EVERY DAY 60 each 2   carbidopa-levodopa (SINEMET CR) 50-200 MG tablet Take 1 tablet by mouth at bedtime.     carbidopa-levodopa (SINEMET IR) 25-100 MG tablet Take 2 tablets by mouth 3 (three) times daily.     Cholecalciferol (VITAMIN D) 50 MCG (2000 UT) tablet Take 2,000 Units by mouth every evening.      citalopram (CELEXA) 20 MG tablet TAKE 1 TABLET BY MOUTH EVERY DAY 90 tablet 2   fluticasone (FLONASE) 50 MCG/ACT nasal spray SPRAY 2 SPRAYS INTO EACH NOSTRIL EVERY DAY (Patient taking differently: Place 2 sprays into both nostrils daily as needed for allergies or rhinitis.) 48 mL 1   furosemide (LASIX) 20 MG tablet Take 20 mg by mouth daily as needed.     guaiFENesin (MUCINEX) 600 MG 12 hr tablet Take 600 mg by mouth daily.     Iron, Ferrous Sulfate, 325 (65 Fe) MG TABS Take 325 mg by mouth daily. 30 tablet 2   latanoprost (XALATAN) 0.005 % ophthalmic solution Place 1 drop into both eyes at bedtime.      metoprolol tartrate (LOPRESSOR) 25 MG tablet Take 0.5 mg by mouth 2 (two) times daily.     Multiple Vitamin (MULTIVITAMIN WITH MINERALS) TABS tablet Take 1 tablet by mouth every evening.      pantoprazole (PROTONIX) 40 MG tablet TAKE 1 TABLET BY MOUTH EVERY DAY 90 tablet 3   potassium chloride (KLOR-CON) 10 MEQ tablet Take one tablet by mouth with lasix. (Patient taking  differently: Take 10 mEq by mouth daily as needed.) 30 tablet 0   rivastigmine (EXELON) 4.6 mg/24hr Place 1 patch onto the skin daily.     tamsulosin (FLOMAX) 0.4 MG CAPS capsule TAKE 1 CAPSULE BY MOUTH EVERY DAY 90 capsule 1   No facility-administered medications prior to visit.     Review of Systems:   Constitutional:   No  weight loss, night sweats,  Fevers, chills,  +fatigue, or  lassitude.  HEENT:   No headaches,   Tooth/dental problems, or  Sore throat,                No sneezing, itching, ear ache, nasal congestion, post nasal drip,   CV:  No chest pain,  Orthopnea, PND, swelling in lower extremities, anasarca, dizziness, palpitations, syncope.  GI  No heartburn, indigestion, abdominal pain, nausea, vomiting, diarrhea, change in bowel habits, loss of appetite, bloody stools.   Resp: No shortness of breath with exertion or at rest.  No excess mucus, no productive cough,  No non-productive cough,  No coughing up of blood.  No change in color of mucus.  No wheezing.  No chest wall deformity  Skin: no rash or lesions.  GU: no dysuria, change in color of urine, no urgency or frequency.  No flank pain, no hematuria   MS:  No joint pain or swelling.   No back pain.    Physical Exam  BP 100/62 (BP Location: Left Arm, Patient Position: Sitting, Cuff Size: Normal)   Pulse 77   Temp (!) 97.3 F (36.3 C) (Oral)   Ht 5' 4.5" (1.638 m)   Wt 129 lb 3.2 oz (58.6 kg)   SpO2 96%   BMI 21.83 kg/m   GEN: A/Ox3; pleasant , NAD, elderly , wc    HEENT:  [place]/AT,  NOSE-clear, THROAT-clear, no lesions, no postnasal drip or exudate noted.   NECK:  Supple w/ fair ROM; no JVD; normal carotid impulses w/o bruits; no thyromegaly or nodules palpated; no lymphadenopathy.    RESP  Clear  P & A; w/o, wheezes/ rales/ or rhonchi. no accessory muscle use, no dullness to percussion  CARD:  RRR, gr 2/6 SM tr  peripheral edema, pulses intact, no cyanosis or clubbing.  GI:   Soft & nt; nml bowel  sounds; no organomegaly or masses detected.   [place]co: Warm bil, no deformities or joint swelling noted.   Neuro: alert, no focal deficits noted.    Skin: Warm, no lesions or rashes    Lab Results:  CBC    Component Value Date/Time   WBC 4.1 11/07/2020 1142   RBC 3.84 (L) 11/07/2020 1142   HGB 11.3 (L) 11/07/2020 1142   HGB 14.4 10/13/2011 0614   HCT 35.0 (L) 11/07/2020 1142   HCT 43.3 10/13/2011 0614   PLT 181.0 11/07/2020 1142   PLT 251 10/13/2011 0614   MCV 91.2 11/07/2020 1142   MCV 91 10/13/2011 0614   MCH 31.2 04/06/2020 0531   MCHC 32.3 11/07/2020 1142   RDW 17.8 (H) 11/07/2020 1142   RDW 15.1 (H) 10/13/2011 0614   LYMPHSABS 0.7 11/07/2020 1142   MONOABS 0.6 11/07/2020 1142   EOSABS 0.0 11/07/2020 1142   BASOSABS 0.0 11/07/2020 1142    BMET    Component Value Date/Time   NA 137 09/25/2020 1522   NA 136 10/13/2011 0614   K 4.2 09/25/2020 1522   K 4.3 10/13/2011 0614   CL 102 09/25/2020 1522   CL 101 10/13/2011 0614   CO2 29 09/25/2020 1522   CO2 26 10/13/2011 0614   GLUCOSE 109 (H) 09/25/2020 1522   GLUCOSE 115 (H) 10/13/2011 0614   BUN 16 09/25/2020 1522   BUN 17 10/13/2011 0614   CREATININE 0.79 09/25/2020 1522   CREATININE 0.92 07/15/2016 1544   CALCIUM 8.6 09/25/2020 1522   CALCIUM 9.2 10/13/2011 0614   GFRNONAA >60 04/05/2020 0501   GFRNONAA >60 10/13/2011 0614   GFRAA >60 07/14/2019 0503   GFRAA >60 10/13/2011 0614    BNP    Component Value Date/Time   BNP 294.0 (H) 01/04/2019 1253    ProBNP No results found for: PROBNP  Imaging: No results found.    No flowsheet data found.  No results found for: NITRICOXIDE      Assessment &  Plan:   No problem-specific Assessment & Plan notes found for this encounter.     Rexene Edison, NP 11/26/2020

## 2020-11-26 NOTE — Assessment & Plan Note (Signed)
Appears compensated on present regimen  Cont follow up with cardiology

## 2020-11-26 NOTE — Assessment & Plan Note (Signed)
Appears stable -continue current regimen Flutter valve and mucinex  Continue on BREO  rec Covid vaccine booster   Plan  Patient Instructions  Continue on BREO 1 puff daily , rinse after use.  Activity as tolerated Flutter valve Three times a day   Mucinex liquid As needed  for congestion .  Aspirations precautions as we discussed.  Covid booster as discussed .  Follow up with Dr Patsey Berthold or Joakim Huesman NP in 6 months and As needed

## 2020-11-26 NOTE — Patient Instructions (Addendum)
Continue on BREO 1 puff daily , rinse after use.  Activity as tolerated Flutter valve Three times a day   Mucinex liquid As needed  for congestion .  Aspirations precautions as we discussed.  Covid booster as discussed .  Follow up with Dr Patsey Berthold or Jillianne Gamino NP in 6 months and As needed

## 2020-11-29 NOTE — Progress Notes (Signed)
Agree with the details of the visit as noted by Tammy Parrett, NP.  C. Laura Allana Shrestha, MD [facility] 

## 2020-12-03 DIAGNOSIS — G4733 Obstructive sleep apnea (adult) (pediatric): Secondary | ICD-10-CM | POA: Diagnosis not present

## 2020-12-20 DIAGNOSIS — I700 Atherosclerosis of aorta: Secondary | ICD-10-CM | POA: Diagnosis not present

## 2020-12-20 DIAGNOSIS — I480 Paroxysmal atrial fibrillation: Secondary | ICD-10-CM | POA: Diagnosis not present

## 2020-12-20 DIAGNOSIS — I951 Orthostatic hypotension: Secondary | ICD-10-CM | POA: Diagnosis not present

## 2020-12-20 DIAGNOSIS — R002 Palpitations: Secondary | ICD-10-CM | POA: Diagnosis not present

## 2020-12-20 DIAGNOSIS — I429 Cardiomyopathy, unspecified: Secondary | ICD-10-CM | POA: Diagnosis not present

## 2020-12-20 DIAGNOSIS — I10 Essential (primary) hypertension: Secondary | ICD-10-CM | POA: Diagnosis not present

## 2021-01-02 DIAGNOSIS — G4733 Obstructive sleep apnea (adult) (pediatric): Secondary | ICD-10-CM | POA: Diagnosis not present

## 2021-01-03 ENCOUNTER — Telehealth: Payer: Self-pay

## 2021-01-03 ENCOUNTER — Ambulatory Visit: Payer: Medicare HMO

## 2021-01-03 NOTE — Telephone Encounter (Signed)
Unable to reach patient for scheduled AWV. Called x2. No answer, no voicemail. Unable to leave message. Reschedule.

## 2021-01-09 ENCOUNTER — Telehealth: Payer: Self-pay | Admitting: Internal Medicine

## 2021-01-09 NOTE — Telephone Encounter (Signed)
Patient's daughter called and would like to speak to Dr Nicki Reaper about some issues that are going on with patient. Please call her at [phone].

## 2021-01-10 DIAGNOSIS — N400 Enlarged prostate without lower urinary tract symptoms: Secondary | ICD-10-CM | POA: Diagnosis not present

## 2021-01-10 DIAGNOSIS — I4891 Unspecified atrial fibrillation: Secondary | ICD-10-CM | POA: Diagnosis not present

## 2021-01-10 DIAGNOSIS — G928 Other toxic encephalopathy: Secondary | ICD-10-CM | POA: Diagnosis not present

## 2021-01-10 DIAGNOSIS — I2510 Atherosclerotic heart disease of native coronary artery without angina pectoris: Secondary | ICD-10-CM | POA: Diagnosis not present

## 2021-01-10 DIAGNOSIS — R69 Illness, unspecified: Secondary | ICD-10-CM | POA: Diagnosis not present

## 2021-01-10 DIAGNOSIS — Z881 Allergy status to other antibiotic agents status: Secondary | ICD-10-CM | POA: Diagnosis not present

## 2021-01-10 DIAGNOSIS — R4182 Altered mental status, unspecified: Secondary | ICD-10-CM | POA: Diagnosis not present

## 2021-01-10 DIAGNOSIS — Z66 Do not resuscitate: Secondary | ICD-10-CM | POA: Diagnosis not present

## 2021-01-10 DIAGNOSIS — R918 Other nonspecific abnormal finding of lung field: Secondary | ICD-10-CM | POA: Diagnosis not present

## 2021-01-10 DIAGNOSIS — F05 Delirium due to known physiological condition: Secondary | ICD-10-CM | POA: Diagnosis not present

## 2021-01-10 DIAGNOSIS — Z87891 Personal history of nicotine dependence: Secondary | ICD-10-CM | POA: Diagnosis not present

## 2021-01-10 DIAGNOSIS — A419 Sepsis, unspecified organism: Secondary | ICD-10-CM | POA: Diagnosis not present

## 2021-01-10 DIAGNOSIS — U071 COVID-19: Secondary | ICD-10-CM | POA: Diagnosis not present

## 2021-01-10 DIAGNOSIS — G9349 Other encephalopathy: Secondary | ICD-10-CM | POA: Diagnosis not present

## 2021-01-10 DIAGNOSIS — Z7952 Long term (current) use of systemic steroids: Secondary | ICD-10-CM | POA: Diagnosis not present

## 2021-01-10 DIAGNOSIS — R0902 Hypoxemia: Secondary | ICD-10-CM | POA: Diagnosis not present

## 2021-01-10 DIAGNOSIS — Z951 Presence of aortocoronary bypass graft: Secondary | ICD-10-CM | POA: Diagnosis not present

## 2021-01-10 DIAGNOSIS — R509 Fever, unspecified: Secondary | ICD-10-CM | POA: Diagnosis not present

## 2021-01-10 DIAGNOSIS — F329 Major depressive disorder, single episode, unspecified: Secondary | ICD-10-CM | POA: Diagnosis not present

## 2021-01-10 DIAGNOSIS — E7800 Pure hypercholesterolemia, unspecified: Secondary | ICD-10-CM | POA: Diagnosis not present

## 2021-01-10 DIAGNOSIS — G20 Parkinson's disease: Secondary | ICD-10-CM | POA: Diagnosis not present

## 2021-01-10 DIAGNOSIS — Z880 Allergy status to penicillin: Secondary | ICD-10-CM | POA: Diagnosis not present

## 2021-01-10 DIAGNOSIS — I482 Chronic atrial fibrillation, unspecified: Secondary | ICD-10-CM | POA: Diagnosis not present

## 2021-01-10 DIAGNOSIS — R5381 Other malaise: Secondary | ICD-10-CM | POA: Diagnosis not present

## 2021-01-10 DIAGNOSIS — I10 Essential (primary) hypertension: Secondary | ICD-10-CM | POA: Diagnosis not present

## 2021-01-10 NOTE — Telephone Encounter (Signed)
Noted and in agreement ER is best plan.  Follow up after.

## 2021-01-10 NOTE — Telephone Encounter (Signed)
Confirmed with grand daughter per daughters request that patient is at the ED

## 2021-01-10 NOTE — Telephone Encounter (Signed)
FYI-  Spoke with daughter to confirm what is going on. Over the last couple of days daughter has noticed a change in patient. Having more trouble swallowing, tremors, fever and chills. Not communicating as much as he normally would. Last night patients wife called daughter and needed help getting him to bed. When daughter arrived, patient was sitting with his arms and legs stiff, shaking "very hard." They were able to get him to the bed with the help of daughters husband. Checked temp and it was 103.2. Gave him 2 doses of childrens tylenol and he was able to rest. Wife called again this morning, patient had fell out of bed. No injuries noted when I spoke with wife. Advised daughter that PCP is out of the office but given his symptoms and significant change in condition, he needs to go to the ED to be evaluated and confirm nothing acute going on. Wife and daughter both agreed to take him to [facility] ED if patient would agree to go. Sending to doc of day for FYI and will follow to confirm patient was evaluated.

## 2021-01-10 NOTE — Telephone Encounter (Signed)
LM for daughter to return call to follow up

## 2021-01-11 DIAGNOSIS — G20 Parkinson's disease: Secondary | ICD-10-CM | POA: Diagnosis not present

## 2021-01-11 DIAGNOSIS — G9349 Other encephalopathy: Secondary | ICD-10-CM | POA: Diagnosis not present

## 2021-01-11 DIAGNOSIS — I2510 Atherosclerotic heart disease of native coronary artery without angina pectoris: Secondary | ICD-10-CM | POA: Diagnosis not present

## 2021-01-11 DIAGNOSIS — I4891 Unspecified atrial fibrillation: Secondary | ICD-10-CM | POA: Diagnosis not present

## 2021-01-11 DIAGNOSIS — U071 COVID-19: Secondary | ICD-10-CM | POA: Diagnosis not present

## 2021-01-11 NOTE — Telephone Encounter (Signed)
Patient is currently admitted

## 2021-01-12 DIAGNOSIS — I2510 Atherosclerotic heart disease of native coronary artery without angina pectoris: Secondary | ICD-10-CM | POA: Diagnosis not present

## 2021-01-12 DIAGNOSIS — G20 Parkinson's disease: Secondary | ICD-10-CM | POA: Diagnosis not present

## 2021-01-12 DIAGNOSIS — G9349 Other encephalopathy: Secondary | ICD-10-CM | POA: Diagnosis not present

## 2021-01-12 DIAGNOSIS — U071 COVID-19: Secondary | ICD-10-CM | POA: Diagnosis not present

## 2021-01-12 DIAGNOSIS — I4891 Unspecified atrial fibrillation: Secondary | ICD-10-CM | POA: Diagnosis not present

## 2021-01-23 ENCOUNTER — Telehealth: Payer: Self-pay | Admitting: Internal Medicine

## 2021-01-23 NOTE — Telephone Encounter (Signed)
Patient passed way around 4:20, yesterday.

## 2021-01-23 NOTE — Telephone Encounter (Signed)
Called and spoke to [place] - pts daughter.

## 2021-01-25 ENCOUNTER — Ambulatory Visit: Payer: PRIVATE HEALTH INSURANCE | Admitting: Internal Medicine

## 2021-02-13 DEATH — deceased
